# Patient Record
Sex: Female | Born: 1965 | Race: Black or African American | Hispanic: No | Marital: Married | Smoking: Former smoker
Health system: Southern US, Community
[De-identification: ages and names within clinical notes are randomized; demographics above are authoritative.]

## PROBLEM LIST (undated history)

## (undated) DIAGNOSIS — Z803 Family history of malignant neoplasm of breast: Secondary | ICD-10-CM

## (undated) DIAGNOSIS — C801 Malignant (primary) neoplasm, unspecified: Secondary | ICD-10-CM

## (undated) DIAGNOSIS — K219 Gastro-esophageal reflux disease without esophagitis: Secondary | ICD-10-CM

## (undated) DIAGNOSIS — C50919 Malignant neoplasm of unspecified site of unspecified female breast: Secondary | ICD-10-CM

## (undated) DIAGNOSIS — R519 Headache, unspecified: Secondary | ICD-10-CM

## (undated) DIAGNOSIS — R51 Headache: Secondary | ICD-10-CM

## (undated) DIAGNOSIS — D649 Anemia, unspecified: Secondary | ICD-10-CM

## (undated) DIAGNOSIS — Z1379 Encounter for other screening for genetic and chromosomal anomalies: Secondary | ICD-10-CM

## (undated) DIAGNOSIS — M1990 Unspecified osteoarthritis, unspecified site: Secondary | ICD-10-CM

## (undated) DIAGNOSIS — I10 Essential (primary) hypertension: Secondary | ICD-10-CM

## (undated) DIAGNOSIS — T8189XA Other complications of procedures, not elsewhere classified, initial encounter: Secondary | ICD-10-CM

## (undated) HISTORY — DX: Family history of malignant neoplasm of breast: Z80.3

## (undated) HISTORY — DX: Malignant neoplasm of unspecified site of unspecified female breast: C50.919

## (undated) HISTORY — PX: MULTIPLE TOOTH EXTRACTIONS: SHX2053

## (undated) HISTORY — DX: Encounter for other screening for genetic and chromosomal anomalies: Z13.79

## (undated) HISTORY — PX: BREAST SURGERY: SHX581

## (undated) HISTORY — PX: COLONOSCOPY W/ BIOPSIES AND POLYPECTOMY: SHX1376

---

## 1998-12-28 ENCOUNTER — Emergency Department (HOSPITAL_COMMUNITY): Admission: EM | Admit: 1998-12-28 | Discharge: 1998-12-28 | Payer: Self-pay | Admitting: Emergency Medicine

## 1998-12-28 ENCOUNTER — Encounter: Payer: Self-pay | Admitting: Emergency Medicine

## 2000-03-03 ENCOUNTER — Encounter: Payer: Self-pay | Admitting: *Deleted

## 2000-03-03 ENCOUNTER — Ambulatory Visit (HOSPITAL_COMMUNITY): Admission: RE | Admit: 2000-03-03 | Discharge: 2000-03-03 | Payer: Self-pay | Admitting: *Deleted

## 2000-03-06 ENCOUNTER — Inpatient Hospital Stay (HOSPITAL_COMMUNITY): Admission: AD | Admit: 2000-03-06 | Discharge: 2000-03-06 | Payer: Self-pay | Admitting: *Deleted

## 2000-07-10 ENCOUNTER — Encounter (HOSPITAL_COMMUNITY): Admission: RE | Admit: 2000-07-10 | Discharge: 2000-07-17 | Payer: Self-pay | Admitting: *Deleted

## 2000-07-14 ENCOUNTER — Encounter: Payer: Self-pay | Admitting: *Deleted

## 2000-07-14 ENCOUNTER — Ambulatory Visit (HOSPITAL_COMMUNITY): Admission: RE | Admit: 2000-07-14 | Discharge: 2000-07-14 | Payer: Self-pay | Admitting: *Deleted

## 2000-07-16 ENCOUNTER — Inpatient Hospital Stay (HOSPITAL_COMMUNITY): Admission: AD | Admit: 2000-07-16 | Discharge: 2000-07-19 | Payer: Self-pay | Admitting: *Deleted

## 2000-07-20 ENCOUNTER — Encounter: Admission: RE | Admit: 2000-07-20 | Discharge: 2000-08-05 | Payer: Self-pay | Admitting: *Deleted

## 2001-02-15 ENCOUNTER — Other Ambulatory Visit: Admission: RE | Admit: 2001-02-15 | Discharge: 2001-02-15 | Payer: Self-pay | Admitting: *Deleted

## 2001-09-08 HISTORY — PX: FOOT SURGERY: SHX648

## 2004-09-17 ENCOUNTER — Emergency Department (HOSPITAL_COMMUNITY): Admission: EM | Admit: 2004-09-17 | Discharge: 2004-09-17 | Payer: Self-pay | Admitting: Emergency Medicine

## 2008-02-11 ENCOUNTER — Encounter: Admission: RE | Admit: 2008-02-11 | Discharge: 2008-02-11 | Payer: Self-pay | Admitting: Family Medicine

## 2009-03-16 ENCOUNTER — Encounter: Admission: RE | Admit: 2009-03-16 | Discharge: 2009-03-16 | Payer: Self-pay | Admitting: Obstetrics and Gynecology

## 2010-12-06 ENCOUNTER — Other Ambulatory Visit: Payer: Self-pay | Admitting: Obstetrics and Gynecology

## 2010-12-06 DIAGNOSIS — Z1231 Encounter for screening mammogram for malignant neoplasm of breast: Secondary | ICD-10-CM

## 2010-12-20 ENCOUNTER — Ambulatory Visit
Admission: RE | Admit: 2010-12-20 | Discharge: 2010-12-20 | Disposition: A | Discharge status: Home / Self Care | Payer: Federal, State, Local not specified - PPO | Source: Ambulatory Visit | Attending: Obstetrics and Gynecology | Admitting: Obstetrics and Gynecology

## 2010-12-20 DIAGNOSIS — Z1231 Encounter for screening mammogram for malignant neoplasm of breast: Secondary | ICD-10-CM

## 2011-01-24 NOTE — Op Note (Signed)
[facility]  Patient:    Briana Price, Briana Price                    MRN: [id number] Proc. Date: 07/16/00 Adm. Date:  [id number] Attending:  Deniece Ree                           Operative Report  PREOPERATIVE DIAGNOSIS:       Intrauterine pregnancy of approximately [redacted] weeks gestation with microsomia, estimated fetal weight of approximately 10 pounds.  POSTOPERATIVE DIAGNOSIS:      Intrauterine pregnancy of approximately [redacted] weeks gestation with microsomia, estimated fetal weight of approximately 10 pounds. Viable female infant with an Apgar of 8 and 9 and weight of 10 pounds.  OPERATION:                    Primary low transverse Pfannenstiel incision cesarean section.  SURGEON:                      Deniece Ree, M.D.  ASSISTANT:                    Kathreen Cosier, M.D.  ANESTHESIA:                   Spinal.  PEDIATRIC PHYSICIAN:          Dagoberto Ligas, M.D.  ESTIMATED BLOOD LOSS:         350 cc.  COMPLICATIONS:                None. The patient tolerated the procedure well and returned to the recovery room in satisfactory condition.  DESCRIPTION OF PROCEDURE:     The patient was taken to the operating room, prepped and draped in usual fashion for a cesarean section. A low Pfannenstiel incision was made. This was carried down through the fascia. At which time, the fascia was entered and excised, extended the incision. A midline was identified. Rectus muscle separated. The abdominal peritoneum was then entered in a vertical fashion using Metzenbaum scissors. The visceral peritoneum was then excised bilaterally toward the round ligaments. Following which, the lower uterine segment was scored. Infant in the midline and bluntly dissected open. Hand was introduced and a viable infants head was delivered. There was moderate meconium present. The nasopharynx was then ______ removing a small amount of fluid. The nasal and pharynx were then  sucked out with the suction bulb. Complete delivery was then carried out without any problems. Cord was clamped and infant turned over to the pediatricians who were in attendance. Cord blood was then obtained. Following which, the placenta, as well as all products of conception, was then manually removed from the uterine cavity. IV Pitocin as well as IV antibiotics were then begun. The myometrium was then closed with #1 chromic in a running locking stitch followed by ______ stitch again using #1 chromic. Reparallelization carried out using 2-0 chromic in a running stitch. Sponge and needle count was correct x 2. Hemostasis was present. Tubes and ovaries bilaterally appear to be within normal limits. The abdominal peritoneum then closed with 2-0 chromic in a running stitch followed by closure of the fascia with #1 Dexon in running stitch. The skin was closed with skin staples. Procedure terminated. The patient tolerated the procedure well and returned to the recovery room in satisfactory condition. DD:  07/16/00 TD:  07/16/00 Job:  [id number]

## 2011-01-24 NOTE — H&P (Signed)
[facility]  Patient:    Briana Price, Briana Price                    MRN: [id number] Adm. Date:  [id number] Attending:  Deniece Ree                         History and Physical  HISTORY:                      The patient is a 45 year old gravida 1, whose estimated date of confinement is July 04, 2000.  The patient was evaluated and felt to have a very large infant with an estimated fetal weight of 10 pounds.  An ultrasound was obtained, which gave an estimated weight of 9-1/2 pounds.  It was discussed with the patient, and the decision was to proceed with a primary cesarean section, with a diagnosis of macrosomia and post-dates.  The patient has had no prenatal complications.  PAST MEDICAL HISTORY:         Significant in that she had a LEEP procedure in 1989.  No other problems.  PHYSICAL EXAMINATION:  GENERAL:                      A well-developed, well-nourished gravid female, in no acute distress.  HEENT:                        Within normal limits.  NECK:                         Supple.  BREASTS:                      Without masses, tenderness, or discharge.  LUNGS:                        Clear to percussion and auscultation.  HEART:                        A normal sinus rhythm without murmur, rub, or gallop.  ABDOMEN:                      Is term gravid.  Fetal heart beats in the left lower quadrant.  EXTREMITIES:                  Within normal limits.  NEUROLOGIC:                   Within normal limits.  PELVIC:                       The cervix is closed, posterior, without any effacement, vertex presentation.  ADMISSION DIAGNOSES:          Intrauterine pregnancy, post-dates, of                               approximately [redacted] weeks gestation, with                               macrosomia.  PLAN:  A primary cesarean section. DD:  07/16/00 TD:  07/16/00 Job: [id number]

## 2011-12-15 ENCOUNTER — Other Ambulatory Visit: Payer: Self-pay | Admitting: Obstetrics and Gynecology

## 2011-12-15 DIAGNOSIS — Z1231 Encounter for screening mammogram for malignant neoplasm of breast: Secondary | ICD-10-CM

## 2012-01-05 ENCOUNTER — Ambulatory Visit
Admission: RE | Admit: 2012-01-05 | Discharge: 2012-01-05 | Disposition: A | Discharge status: Home / Self Care | Payer: Federal, State, Local not specified - PPO | Source: Ambulatory Visit | Attending: Obstetrics and Gynecology | Admitting: Obstetrics and Gynecology

## 2012-01-05 ENCOUNTER — Inpatient Hospital Stay: Admission: RE | Admit: 2012-01-05 | Payer: Federal, State, Local not specified - PPO | Source: Ambulatory Visit

## 2012-01-05 ENCOUNTER — Ambulatory Visit: Payer: Federal, State, Local not specified - PPO

## 2012-01-05 DIAGNOSIS — Z1231 Encounter for screening mammogram for malignant neoplasm of breast: Secondary | ICD-10-CM

## 2012-10-26 ENCOUNTER — Other Ambulatory Visit: Payer: Self-pay | Admitting: Gastroenterology

## 2012-10-26 DIAGNOSIS — R110 Nausea: Secondary | ICD-10-CM

## 2012-11-05 ENCOUNTER — Encounter (HOSPITAL_COMMUNITY): Payer: Federal, State, Local not specified - PPO

## 2012-11-05 ENCOUNTER — Ambulatory Visit (HOSPITAL_COMMUNITY): Payer: Federal, State, Local not specified - PPO

## 2012-11-09 ENCOUNTER — Ambulatory Visit (HOSPITAL_COMMUNITY)
Admission: RE | Admit: 2012-11-09 | Discharge: 2012-11-09 | Disposition: A | Discharge status: Home / Self Care | Payer: Federal, State, Local not specified - PPO | Source: Ambulatory Visit | Attending: Gastroenterology | Admitting: Gastroenterology

## 2012-11-09 ENCOUNTER — Ambulatory Visit (HOSPITAL_COMMUNITY): Payer: Federal, State, Local not specified - PPO

## 2012-11-09 DIAGNOSIS — R110 Nausea: Secondary | ICD-10-CM | POA: Insufficient documentation

## 2012-12-07 ENCOUNTER — Encounter (INDEPENDENT_AMBULATORY_CARE_PROVIDER_SITE_OTHER): Payer: Self-pay

## 2012-12-14 ENCOUNTER — Encounter (INDEPENDENT_AMBULATORY_CARE_PROVIDER_SITE_OTHER): Payer: Self-pay

## 2013-01-11 ENCOUNTER — Encounter (INDEPENDENT_AMBULATORY_CARE_PROVIDER_SITE_OTHER): Payer: Self-pay | Admitting: General Surgery

## 2013-01-11 ENCOUNTER — Ambulatory Visit (INDEPENDENT_AMBULATORY_CARE_PROVIDER_SITE_OTHER): Payer: Federal, State, Local not specified - PPO | Admitting: General Surgery

## 2013-01-11 VITALS — BP 124/86 | HR 76 | Temp 97.2°F | Resp 14 | Ht 67.0 in | Wt 220.0 lb

## 2013-01-11 DIAGNOSIS — K8020 Calculus of gallbladder without cholecystitis without obstruction: Secondary | ICD-10-CM

## 2013-01-11 NOTE — H&P (Signed)
Briana Price is an 47 y.o. female.   Chief Complaint: gallstones noted on incidental ultrasound. HPI: the patient was seen by her primary care physician her obstetrician referred her to a gastroenterologist because of some lower abdominal discomfort. The pain was primarily on the left also has some discomfort on the right. The gastroenterologist and the workup did an upper GI endoscopy and also colonoscopy. With the exception of some small tubular adenomas the colonoscopy and upper GI endoscopy were normal.  The patient was sent for an abdominal ultrasound was demonstrated that showed multiple gallstones with a large gallstone near the neck of the gallbladder. She did not have cholecystitis.  The patient reports minimal symptoms. She did recall after questioning she has had 3-4 episodes of abdominal discomfort and pain rising up towards her epigastrium and then radiating towards her back. She cannot relate this to any particular foods or eating at all.   No past medical history on file.  Past Surgical History  Procedure Laterality Date  . Cesarean section    . Foot surgery      Family History  Problem Relation Age of Onset  . Breast cancer Maternal Grandmother   . Breast cancer Paternal Aunt    Social History:  reports that she has never smoked. She does not have any smokeless tobacco history on file. She reports that she does not drink alcohol or use illicit drugs.  Allergies: No Known Allergies   (Not in a hospital admission)  No results found for this or any previous visit (from the past 48 hour(s)). No results found.  Review of Systems  Constitutional: Negative for fever and chills.  HENT: Negative.   Eyes: Negative.   Respiratory: Negative.   Cardiovascular: Negative.   Gastrointestinal: Positive for abdominal pain (mild).  Genitourinary: Negative.   Musculoskeletal: Negative.   Skin: Negative.   Neurological: Negative.   Endo/Heme/Allergies: Negative.    Psychiatric/Behavioral: Negative.     Blood pressure 124/86, pulse 76, temperature 97.2 F (36.2 C), resp. rate 14, height 5' 7" (1.702 m), weight 220 lb (99.791 kg). Physical Exam  Constitutional: She appears well-developed and well-nourished.  HENT:  Head: Normocephalic and atraumatic.  Eyes: Conjunctivae and EOM are normal. Pupils are equal, round, and reactive to light.  Neck: Normal range of motion. Neck supple.  Cardiovascular: Normal rate, regular rhythm and normal heart sounds.   Respiratory: Effort normal and breath sounds normal.  GI: Soft. Bowel sounds are normal. There is no tenderness.  Musculoskeletal: Normal range of motion.  Neurological: She is alert.  Skin: Skin is warm and dry.  Psychiatric: She has a normal mood and affect. Her behavior is normal. Judgment and thought content normal.     Assessment/Plan Cholelithiasis with minimal symptoms.  Patient is undecided about whether or not and when she wold like to have surgery.  I believe that the patient has had symptoms although infrequent.  Would recommend cholecystectomy electively, but likely before her scheduled cruise in August.   Jazzlin Clements O 01/11/2013, 8:45 AM    

## 2013-01-11 NOTE — Progress Notes (Signed)
The patient's office visit was dictated as an admitting history and physical for possible surgery in the future.

## 2013-01-19 ENCOUNTER — Telehealth (INDEPENDENT_AMBULATORY_CARE_PROVIDER_SITE_OTHER): Payer: Self-pay

## 2013-01-19 NOTE — Telephone Encounter (Signed)
The patient called regarding scheduling surgery .   She is looking at June 2nd with Debbie.  She has a cruise set for 8/3.  I told her I think this is enough time for recovery.  She will have 2 flights that are 3 and 4 hours long.  I told her she should get up and walk at least once per hour if she can.  She asked if she can get in the water and I said yes as long as her incisions are healed.  Please call her if you disagree with the above advice given.

## 2013-01-20 ENCOUNTER — Encounter (INDEPENDENT_AMBULATORY_CARE_PROVIDER_SITE_OTHER): Payer: Self-pay

## 2013-01-27 ENCOUNTER — Encounter (INDEPENDENT_AMBULATORY_CARE_PROVIDER_SITE_OTHER): Payer: Self-pay

## 2013-02-01 ENCOUNTER — Encounter (HOSPITAL_BASED_OUTPATIENT_CLINIC_OR_DEPARTMENT_OTHER): Payer: Self-pay | Admitting: *Deleted

## 2013-02-01 NOTE — Progress Notes (Signed)
To come in 5/30 for bmet-and ekg if pcp one over 12 months- Saw cardiology last yr for chest pressure-negative-called for notes

## 2013-02-04 ENCOUNTER — Encounter (HOSPITAL_BASED_OUTPATIENT_CLINIC_OR_DEPARTMENT_OTHER)
Admission: RE | Admit: 2013-02-04 | Discharge: 2013-02-04 | Disposition: A | Discharge status: Home / Self Care | Payer: Federal, State, Local not specified - PPO | Source: Ambulatory Visit | Attending: General Surgery | Admitting: General Surgery

## 2013-02-04 LAB — BASIC METABOLIC PANEL
BUN: 12 mg/dL (ref 6–23)
CO2: 26 mEq/L (ref 19–32)
Chloride: 100 mEq/L (ref 96–112)
Creatinine, Ser: 0.66 mg/dL (ref 0.50–1.10)
Glucose, Bld: 92 mg/dL (ref 70–99)

## 2013-02-07 ENCOUNTER — Ambulatory Visit (HOSPITAL_BASED_OUTPATIENT_CLINIC_OR_DEPARTMENT_OTHER): Payer: Federal, State, Local not specified - PPO | Admitting: Anesthesiology

## 2013-02-07 ENCOUNTER — Encounter (HOSPITAL_BASED_OUTPATIENT_CLINIC_OR_DEPARTMENT_OTHER): Payer: Self-pay | Admitting: *Deleted

## 2013-02-07 ENCOUNTER — Encounter (HOSPITAL_BASED_OUTPATIENT_CLINIC_OR_DEPARTMENT_OTHER): Payer: Self-pay | Admitting: Anesthesiology

## 2013-02-07 ENCOUNTER — Ambulatory Visit (HOSPITAL_COMMUNITY): Payer: Federal, State, Local not specified - PPO

## 2013-02-07 ENCOUNTER — Ambulatory Visit (HOSPITAL_BASED_OUTPATIENT_CLINIC_OR_DEPARTMENT_OTHER)
Admission: RE | Admit: 2013-02-07 | Discharge: 2013-02-07 | Disposition: A | Discharge status: Home / Self Care | Payer: Federal, State, Local not specified - PPO | Source: Ambulatory Visit | Attending: General Surgery | Admitting: General Surgery

## 2013-02-07 ENCOUNTER — Encounter (HOSPITAL_BASED_OUTPATIENT_CLINIC_OR_DEPARTMENT_OTHER)
Admission: RE | Disposition: A | Discharge status: Home / Self Care | Payer: Self-pay | Source: Ambulatory Visit | Attending: General Surgery

## 2013-02-07 DIAGNOSIS — K8010 Calculus of gallbladder with chronic cholecystitis without obstruction: Secondary | ICD-10-CM

## 2013-02-07 DIAGNOSIS — Z860100 Personal history of colon polyps, unspecified: Secondary | ICD-10-CM | POA: Insufficient documentation

## 2013-02-07 DIAGNOSIS — K219 Gastro-esophageal reflux disease without esophagitis: Secondary | ICD-10-CM | POA: Insufficient documentation

## 2013-02-07 DIAGNOSIS — K8020 Calculus of gallbladder without cholecystitis without obstruction: Secondary | ICD-10-CM

## 2013-02-07 DIAGNOSIS — Z803 Family history of malignant neoplasm of breast: Secondary | ICD-10-CM | POA: Insufficient documentation

## 2013-02-07 DIAGNOSIS — I10 Essential (primary) hypertension: Secondary | ICD-10-CM | POA: Insufficient documentation

## 2013-02-07 HISTORY — PX: CHOLECYSTECTOMY: SHX55

## 2013-02-07 HISTORY — DX: Essential (primary) hypertension: I10

## 2013-02-07 HISTORY — DX: Gastro-esophageal reflux disease without esophagitis: K21.9

## 2013-02-07 SURGERY — LAPAROSCOPIC CHOLECYSTECTOMY WITH INTRAOPERATIVE CHOLANGIOGRAM
Anesthesia: General | Site: Abdomen | Wound class: Clean Contaminated

## 2013-02-07 MED ORDER — LIDOCAINE HCL (CARDIAC) 20 MG/ML IV SOLN
INTRAVENOUS | Status: DC | PRN
  Administered 2013-02-07: 75 mg via INTRAVENOUS

## 2013-02-07 MED ORDER — METOCLOPRAMIDE HCL 5 MG/ML IJ SOLN
INTRAMUSCULAR | Status: DC | PRN
  Administered 2013-02-07: 10 mg via INTRAVENOUS

## 2013-02-07 MED ORDER — MIDAZOLAM HCL 5 MG/5ML IJ SOLN
INTRAMUSCULAR | Status: DC | PRN
  Administered 2013-02-07: 2 mg via INTRAVENOUS

## 2013-02-07 MED ORDER — SODIUM CHLORIDE 0.9 % IV SOLN
INTRAVENOUS | Status: DC | PRN
  Administered 2013-02-07: 12:00:00

## 2013-02-07 MED ORDER — METOCLOPRAMIDE HCL 5 MG/ML IJ SOLN: 10.0000 mg | Freq: Once | INTRAMUSCULAR | Status: DC | PRN

## 2013-02-07 MED ORDER — DEXAMETHASONE SODIUM PHOSPHATE 4 MG/ML IJ SOLN
INTRAMUSCULAR | Status: DC | PRN
  Administered 2013-02-07: 10 mg via INTRAVENOUS

## 2013-02-07 MED ORDER — HYDROMORPHONE HCL PF 1 MG/ML IJ SOLN: 0.2500 mg | INTRAMUSCULAR | Status: DC | PRN

## 2013-02-07 MED ORDER — LACTATED RINGERS IV SOLN
INTRAVENOUS | Status: DC
  Administered 2013-02-07 (×2): via INTRAVENOUS

## 2013-02-07 MED ORDER — OXYCODONE HCL 5 MG/5ML PO SOLN: 5.0000 mg | Freq: Once | ORAL | Status: AC | PRN

## 2013-02-07 MED ORDER — ROCURONIUM BROMIDE 100 MG/10ML IV SOLN
INTRAVENOUS | Status: DC | PRN
  Administered 2013-02-07: 40 mg via INTRAVENOUS

## 2013-02-07 MED ORDER — SODIUM CHLORIDE 0.9 % IR SOLN
Status: DC | PRN
  Administered 2013-02-07: 400 mL

## 2013-02-07 MED ORDER — NEOSTIGMINE METHYLSULFATE 1 MG/ML IJ SOLN
INTRAMUSCULAR | Status: DC | PRN
  Administered 2013-02-07: 4 mg via INTRAVENOUS

## 2013-02-07 MED ORDER — SCOPOLAMINE 1 MG/3DAYS TD PT72
1.0000 | MEDICATED_PATCH | Freq: Once | TRANSDERMAL | Status: DC
  Administered 2013-02-07: 1.5 mg via TRANSDERMAL

## 2013-02-07 MED ORDER — OXYCODONE-ACETAMINOPHEN 5-325 MG PO TABS: 1.0000 | ORAL_TABLET | ORAL | Status: DC | PRN

## 2013-02-07 MED ORDER — PROPOFOL 10 MG/ML IV BOLUS
INTRAVENOUS | Status: DC | PRN
  Administered 2013-02-07: 200 mg via INTRAVENOUS

## 2013-02-07 MED ORDER — MIDAZOLAM HCL 2 MG/2ML IJ SOLN: 1.0000 mg | INTRAMUSCULAR | Status: DC | PRN

## 2013-02-07 MED ORDER — GLYCOPYRROLATE 0.2 MG/ML IJ SOLN
INTRAMUSCULAR | Status: DC | PRN
  Administered 2013-02-07: 0.6 mg via INTRAVENOUS

## 2013-02-07 MED ORDER — ONDANSETRON HCL 4 MG/2ML IJ SOLN
INTRAMUSCULAR | Status: DC | PRN
  Administered 2013-02-07: 4 mg via INTRAVENOUS

## 2013-02-07 MED ORDER — FENTANYL CITRATE 0.05 MG/ML IJ SOLN
INTRAMUSCULAR | Status: DC | PRN
  Administered 2013-02-07: 25 ug via INTRAVENOUS
  Administered 2013-02-07: 100 ug via INTRAVENOUS
  Administered 2013-02-07: 25 ug via INTRAVENOUS

## 2013-02-07 MED ORDER — BUPIVACAINE-EPINEPHRINE 0.25% -1:200000 IJ SOLN
INTRAMUSCULAR | Status: DC | PRN
  Administered 2013-02-07: 15.5 mL

## 2013-02-07 MED ORDER — OXYCODONE HCL 5 MG PO TABS
5.0000 mg | ORAL_TABLET | Freq: Once | ORAL | Status: AC | PRN
  Administered 2013-02-07: 5 mg via ORAL

## 2013-02-07 MED ORDER — FENTANYL CITRATE 0.05 MG/ML IJ SOLN: 50.0000 ug | INTRAMUSCULAR | Status: DC | PRN

## 2013-02-07 MED ORDER — DEXTROSE 5 % IV SOLN
2.0000 g | INTRAVENOUS | Status: DC | PRN
  Administered 2013-02-07: 2 g via INTRAVENOUS

## 2013-02-07 SURGICAL SUPPLY — 51 items
ADH SKN CLS APL DERMABOND .7 (GAUZE/BANDAGES/DRESSINGS) ×1
APPLIER CLIP 5 13 M/L LIGAMAX5 (MISCELLANEOUS) ×2
APPLIER CLIP ROT 10 11.4 M/L (STAPLE)
APR CLP MED LRG 11.4X10 (STAPLE)
APR CLP MED LRG 5 ANG JAW (MISCELLANEOUS) ×1
BAG SPEC RTRVL LRG 6X4 10 (ENDOMECHANICALS) ×1
BLADE SURG ROTATE 9660 (MISCELLANEOUS) IMPLANT
CANISTER SUCTION 2500CC (MISCELLANEOUS) ×2 IMPLANT
CHLORAPREP W/TINT 26ML (MISCELLANEOUS) ×2 IMPLANT
CLEANER CAUTERY TIP 5X5 PAD (MISCELLANEOUS) ×1 IMPLANT
CLIP APPLIE 5 13 M/L LIGAMAX5 (MISCELLANEOUS) ×1 IMPLANT
CLIP APPLIE ROT 10 11.4 M/L (STAPLE) IMPLANT
CLOTH BEACON ORANGE TIMEOUT ST (SAFETY) ×2 IMPLANT
COVER MAYO STAND STRL (DRAPES) ×2 IMPLANT
DECANTER SPIKE VIAL GLASS SM (MISCELLANEOUS) IMPLANT
DERMABOND ADVANCED (GAUZE/BANDAGES/DRESSINGS) ×1
DERMABOND ADVANCED .7 DNX12 (GAUZE/BANDAGES/DRESSINGS) ×1 IMPLANT
DRAPE C-ARM 42X72 X-RAY (DRAPES) ×2 IMPLANT
DRAPE UTILITY XL STRL (DRAPES) ×2 IMPLANT
DRSG TEGADERM 2-3/8X2-3/4 SM (GAUZE/BANDAGES/DRESSINGS) ×8 IMPLANT
ELECT REM PT RETURN 9FT ADLT (ELECTROSURGICAL) ×2
ELECTRODE REM PT RTRN 9FT ADLT (ELECTROSURGICAL) ×1 IMPLANT
FILTER SMOKE EVAC LAPAROSHD (FILTER) ×1 IMPLANT
GLOVE BIO SURGEON STRL SZ 6.5 (GLOVE) ×1 IMPLANT
GLOVE BIO SURGEON STRL SZ7 (GLOVE) ×1 IMPLANT
GLOVE BIO SURGEON STRL SZ7.5 (GLOVE) ×1 IMPLANT
GLOVE BIOGEL PI IND STRL 8 (GLOVE) ×1 IMPLANT
GLOVE BIOGEL PI INDICATOR 8 (GLOVE) ×1
GLOVE ECLIPSE 7.5 STRL STRAW (GLOVE) ×2 IMPLANT
GLOVE INDICATOR 6.5 STRL GRN (GLOVE) ×1 IMPLANT
GLOVE INDICATOR 7.0 STRL GRN (GLOVE) ×1 IMPLANT
GOWN PREVENTION PLUS XLARGE (GOWN DISPOSABLE) ×7 IMPLANT
HEMOSTAT SNOW SURGICEL 2X4 (HEMOSTASIS) ×1 IMPLANT
NS IRRIG 1000ML POUR BTL (IV SOLUTION) IMPLANT
PACK BASIN DAY SURGERY FS (CUSTOM PROCEDURE TRAY) ×2 IMPLANT
PAD CLEANER CAUTERY TIP 5X5 (MISCELLANEOUS) ×1
POUCH SPECIMEN RETRIEVAL 10MM (ENDOMECHANICALS) ×2 IMPLANT
SCISSORS LAP 5X35 DISP (ENDOMECHANICALS) IMPLANT
SET CHOLANGIOGRAPH 5 50 .035 (SET/KITS/TRAYS/PACK) ×1 IMPLANT
SET IRRIG TUBING LAPAROSCOPIC (IRRIGATION / IRRIGATOR) ×2 IMPLANT
SLEEVE ENDOPATH XCEL 5M (ENDOMECHANICALS) ×4 IMPLANT
SLEEVE SCD COMPRESS KNEE MED (MISCELLANEOUS) ×2 IMPLANT
SPECIMEN JAR SMALL (MISCELLANEOUS) ×2 IMPLANT
SUT MNCRL AB 4-0 PS2 18 (SUTURE) ×2 IMPLANT
TOWEL OR 17X24 6PK STRL BLUE (TOWEL DISPOSABLE) ×2 IMPLANT
TOWEL OR NON WOVEN STRL DISP B (DISPOSABLE) ×2 IMPLANT
TRAY LAPAROSCOPIC (CUSTOM PROCEDURE TRAY) ×2 IMPLANT
TROCAR XCEL BLUNT TIP 100MML (ENDOMECHANICALS) ×2 IMPLANT
TROCAR XCEL NON-BLD 11X100MML (ENDOMECHANICALS) IMPLANT
TROCAR XCEL NON-BLD 5MMX100MML (ENDOMECHANICALS) ×2 IMPLANT
TUBE CONNECTING 20X1/4 (TUBING) ×2 IMPLANT

## 2013-02-07 NOTE — Interval H&P Note (Signed)
History and Physical Interval Note:  02/07/2013 10:32 AM  Briana Price  has presented today for surgery, with the diagnosis of cholelithisis  The various methods of treatment have been discussed with the patient and family. After consideration of risks, benefits and other options for treatment, the patient has consented to  Procedure(s): LAPAROSCOPIC CHOLECYSTECTOMY WITH INTRAOPERATIVE CHOLANGIOGRAM (N/A) as a surgical intervention .  The patient's history has been reviewed, patient examined, no change in status, stable for surgery.  I have reviewed the patient's chart and labs.  Questions were answered to the patient's satisfaction.    No symptoms since initial evaluation.   Ernestine Langworthy, Marta Lamas

## 2013-02-07 NOTE — Op Note (Signed)
OPERATIVE REPORT  DATE OF OPERATION: 02/07/2013  PATIENT:  Briana Price  47 y.o. female  PRE-OPERATIVE DIAGNOSIS:  cholelithiasis  POST-OPERATIVE DIAGNOSIS:  cholelithiasis  PROCEDURE:  Procedure(s): LAPAROSCOPIC CHOLECYSTECTOMY WITH INTRAOPERATIVE CHOLANGIOGRAM  SURGEON:  Surgeon(s): Cherylynn Ridges, MD Robyne Askew, MD  ASSISTANT: Carolynne Edouard  ANESTHESIA:   general  EBL: <20 ml  BLOOD ADMINISTERED: none  DRAINS: none   SPECIMEN:  Source of Specimen:  Gallbladder with stones  COUNTS CORRECT:  YES  PROCEDURE DETAILS: The patient was taken to the operating room and placed on the table in the supine position.  After an adequate endotracheal anesthetic was administered, (she/he) was prepped with (ChloroPrep/Betadine), and then draped in the usual manner exposing the entire abdomen laterally, inferiorly and up  to the costal margins.  After a proper timeout was performed including identifying the patient and the procedure to be performed, a infra-umbilical 1.5cm midline incision was made using a #15 blade.  This was taken down to the fascia which was then incised with a #15 blade.  The edges of the fascia were tented up with Kocher clamps as the preperitoneal space was penetrated with a Kelly clamp into the peritoneum.  Once this was done, a pursestring suture of 0 Vicryl was passed around the fascial opening.  This was subsequently used to secure the [facility] cannula which was passed into the peritoneal cavity.  Once the [facility] cannula was in place, carbon dioxide gas was insufflated into the peritoneal cavity up to a maximal intra-abdominal pressure of 15mm Hg.The laparoscope, with attached camera and light source, was passed into the peritoneal cavity to visualize the direct insertion of two right upper quadrant 5mm cannulas, and a sup-xiphoid 5mm cannula.  Once all cannulas were in place, the dissection was begun.  Two ratcheted graspers were attached to the dome and infundibulum of  the gallbladder and retracted towards the anterior abdominal wall and the right upper quadrant.  Using cautery attached to a dissecting forceps, the peritoneum overlaying the triangle of Chalot and the hepatoduodenal triangle was dissected away exposing the cystic duct and the cystic artery.  A clip was placed on the gallbladder side of the cystic duct, then a cholecytodochotomy made using the laparoscopic scissors.  Through the cholecystodochotomy a Cook catheter was passed to performed a cholangiogram.  The cholangiogram showed good flow into the duodenum, good proximal filling, no intraductal filling defects, and no dilatation..  Once the cholangiogram was completed, the [facility] catheter was removed, and the distal cystic duct was clipped multiple times then transected.  The gallbladder was then dissected out of the hepatic bed without event.  It was retrieved from the abdomen (using an EndoCatch bag) without event.  Once the gallbladder was removed, the bed was inspected for hemostasis.  Once excellent hemostasis was obtained all gas and fluids were aspirated from above the liver, then the cannulas were removed.  The infra-umbilical incision was closed using the pursestring suture which was in place.  0.25% bupivicaine with epinephrine was injected at all sites.  All 10mm or greater cannula sites were close using a running subcuticular stitch of 4-0 Monocryl.  5.45mm cannula sites were closed with Dermabond only.Steri-Strips and Tagaderm were used to complete the dressings at all sites.  At this point all needle, sponge, and instrument counts were correct.The patient was awakened from anesthesia and taken to the PACU in stable condition.  PATIENT DISPOSITION:  PACU - hemodynamically stable.   Cherylynn Ridges 6/2/201412:06 PM

## 2013-02-07 NOTE — Anesthesia Postprocedure Evaluation (Signed)
Anesthesia Post Note  Patient: Briana Price  Procedure(s) Performed: Procedure(s) (LRB): LAPAROSCOPIC CHOLECYSTECTOMY WITH INTRAOPERATIVE CHOLANGIOGRAM (N/A)  Anesthesia type: General  Patient location: PACU  Post pain: Pain level controlled  Post assessment: Patient's Cardiovascular Status Stable  Last Vitals:  Filed Vitals:   02/07/13 1315  BP: 140/92  Pulse: 74  Temp:   Resp: 15    Post vital signs: Reviewed and stable  Level of consciousness: alert  Complications: No apparent anesthesia complications

## 2013-02-07 NOTE — Anesthesia Preprocedure Evaluation (Signed)
Anesthesia Evaluation  Patient identified by MRN, date of birth, ID band Patient awake    Reviewed: Allergy & Precautions, H&P , NPO status , Patient's Chart, lab work & pertinent test results, reviewed documented beta blocker date and time   Airway Mallampati: II TM Distance: >3 FB Neck ROM: full    Dental   Pulmonary neg pulmonary ROS,  breath sounds clear to auscultation        Cardiovascular hypertension, Rhythm:regular     Neuro/Psych negative neurological ROS  negative psych ROS   GI/Hepatic negative GI ROS, Neg liver ROS, GERD-  Medicated and Controlled,  Endo/Other  negative endocrine ROS  Renal/GU negative Renal ROS  negative genitourinary   Musculoskeletal   Abdominal   Peds  Hematology negative hematology ROS (+)   Anesthesia Other Findings See surgeon's H&P   Reproductive/Obstetrics negative OB ROS                           Anesthesia Physical Anesthesia Plan  ASA: II  Anesthesia Plan: General   Post-op Pain Management:    Induction: Intravenous  Airway Management Planned: Oral ETT  Additional Equipment:   Intra-op Plan:   Post-operative Plan: Extubation in OR  Informed Consent: I have reviewed the patients History and Physical, chart, labs and discussed the procedure including the risks, benefits and alternatives for the proposed anesthesia with the patient or authorized representative who has indicated his/her understanding and acceptance.   Dental Advisory Given  Plan Discussed with: CRNA and Surgeon  Anesthesia Plan Comments:         Anesthesia Quick Evaluation

## 2013-02-07 NOTE — Transfer of Care (Signed)
Immediate Anesthesia Transfer of Care Note  Patient: Briana Price  Procedure(s) Performed: Procedure(s): LAPAROSCOPIC CHOLECYSTECTOMY WITH INTRAOPERATIVE CHOLANGIOGRAM (N/A)  Patient Location: PACU  Anesthesia Type:General  Level of Consciousness: awake, alert  and oriented  Airway & Oxygen Therapy: Patient Spontanous Breathing and Patient connected to face mask oxygen  Post-op Assessment: Report given to PACU RN and Post -op Vital signs reviewed and stable  Post vital signs: Reviewed and stable  Complications: No apparent anesthesia complications

## 2013-02-07 NOTE — H&P (View-Only) (Signed)
Briana Price is an 46 y.o. female.   Chief Complaint: gallstones noted on incidental ultrasound. HPI: the patient was seen by her primary care physician her obstetrician referred her to a gastroenterologist because of some lower abdominal discomfort. The pain was primarily on the left also has some discomfort on the right. The gastroenterologist and the workup did an upper GI endoscopy and also colonoscopy. With the exception of some small tubular adenomas the colonoscopy and upper GI endoscopy were normal.  The patient was sent for an abdominal ultrasound was demonstrated that showed multiple gallstones with a large gallstone near the neck of the gallbladder. She did not have cholecystitis.  The patient reports minimal symptoms. She did recall after questioning she has had 3-4 episodes of abdominal discomfort and pain rising up towards her epigastrium and then radiating towards her back. She cannot relate this to any particular foods or eating at all.   No past medical history on file.  Past Surgical History  Procedure Laterality Date  . Cesarean section    . Foot surgery      Family History  Problem Relation Age of Onset  . Breast cancer Maternal Grandmother   . Breast cancer Paternal Aunt    Social History:  reports that she has never smoked. She does not have any smokeless tobacco history on file. She reports that she does not drink alcohol or use illicit drugs.  Allergies: No Known Allergies   (Not in a hospital admission)  No results found for this or any previous visit (from the past 48 hour(s)). No results found.  Review of Systems  Constitutional: Negative for fever and chills.  HENT: Negative.   Eyes: Negative.   Respiratory: Negative.   Cardiovascular: Negative.   Gastrointestinal: Positive for abdominal pain (mild).  Genitourinary: Negative.   Musculoskeletal: Negative.   Skin: Negative.   Neurological: Negative.   Endo/Heme/Allergies: Negative.    Psychiatric/Behavioral: Negative.     Blood pressure 124/86, pulse 76, temperature 97.2 F (36.2 C), resp. rate 14, height 5\' 7"  (1.702 m), weight 220 lb (99.791 kg). Physical Exam  Constitutional: She appears well-developed and well-nourished.  HENT:  Head: Normocephalic and atraumatic.  Eyes: Conjunctivae and EOM are normal. Pupils are equal, round, and reactive to light.  Neck: Normal range of motion. Neck supple.  Cardiovascular: Normal rate, regular rhythm and normal heart sounds.   Respiratory: Effort normal and breath sounds normal.  GI: Soft. Bowel sounds are normal. There is no tenderness.  Musculoskeletal: Normal range of motion.  Neurological: She is alert.  Skin: Skin is warm and dry.  Psychiatric: She has a normal mood and affect. Her behavior is normal. Judgment and thought content normal.     Assessment/Plan Cholelithiasis with minimal symptoms.  Patient is undecided about whether or not and when she wold like to have surgery.  I believe that the patient has had symptoms although infrequent.  Would recommend cholecystectomy electively, but likely before her scheduled cruise in August.   Deshannon Hinchliffe O 01/11/2013, 8:45 AM

## 2013-02-08 ENCOUNTER — Encounter (INDEPENDENT_AMBULATORY_CARE_PROVIDER_SITE_OTHER): Payer: Self-pay | Admitting: General Surgery

## 2013-02-08 ENCOUNTER — Encounter (HOSPITAL_BASED_OUTPATIENT_CLINIC_OR_DEPARTMENT_OTHER): Payer: Self-pay | Admitting: General Surgery

## 2013-02-09 ENCOUNTER — Telehealth (INDEPENDENT_AMBULATORY_CARE_PROVIDER_SITE_OTHER): Payer: Self-pay

## 2013-02-09 NOTE — Telephone Encounter (Signed)
The pt called concerned about her incision.  He has bled through the gauze.  It started this when she was at the hospital.  It is not red around it.  She has no fever.  I asked her to remove the bandage, take a shower and gently wash the area.  I told her to apply a thick piece of gauze and tape it down.  Apply pressure for 5 minutes.  I told her to watch it until lunch today and call us back if it soaks through the new gauze.  She agrees.  She also asked about returning to work.  She is scheduled to f/u on 6/19.  She was written out until 6/16.  She asked if she can go to work then or does she have to see him 1st.  I told her she can go to work if she is feeling well.

## 2013-02-17 ENCOUNTER — Telehealth (INDEPENDENT_AMBULATORY_CARE_PROVIDER_SITE_OTHER): Payer: Self-pay

## 2013-02-17 NOTE — Telephone Encounter (Signed)
Patient called stating she has dry greenish color at incision site Denies fever ,odor,pus, Advised patient dry drainage at incision is ok but if she has any pus ,odor,fever to call office. She is aware of appointment  with Dr. Lindie Spruce 02/24/13

## 2013-02-24 ENCOUNTER — Encounter (INDEPENDENT_AMBULATORY_CARE_PROVIDER_SITE_OTHER): Payer: Self-pay | Admitting: General Surgery

## 2013-02-24 ENCOUNTER — Telehealth (INDEPENDENT_AMBULATORY_CARE_PROVIDER_SITE_OTHER): Payer: Self-pay | Admitting: General Surgery

## 2013-02-24 ENCOUNTER — Encounter (INDEPENDENT_AMBULATORY_CARE_PROVIDER_SITE_OTHER): Payer: Federal, State, Local not specified - PPO | Admitting: General Surgery

## 2013-02-24 ENCOUNTER — Other Ambulatory Visit: Payer: Self-pay

## 2013-02-24 DIAGNOSIS — Z1231 Encounter for screening mammogram for malignant neoplasm of breast: Secondary | ICD-10-CM

## 2013-02-24 NOTE — Telephone Encounter (Signed)
Patient called in stating that she was having car trouble and would not be able to make to make it for today's appt...per verbal orders from JW patient needs to remove all bandages and take shower..If wounds appear well without any infection or drainage then we can see her on a PRN basis, but if she has any concerns or problems to page him he may either have her come in to see urge or come over himself to see her..pt is aware and will pick up letter tomorrow at front desk 02/25/13.

## 2013-02-25 ENCOUNTER — Ambulatory Visit
Admission: RE | Admit: 2013-02-25 | Discharge: 2013-02-25 | Disposition: A | Discharge status: Home / Self Care | Payer: Federal, State, Local not specified - PPO | Source: Ambulatory Visit

## 2013-02-25 ENCOUNTER — Ambulatory Visit: Payer: Federal, State, Local not specified - PPO

## 2013-02-25 DIAGNOSIS — Z1231 Encounter for screening mammogram for malignant neoplasm of breast: Secondary | ICD-10-CM

## 2013-03-09 ENCOUNTER — Encounter (INDEPENDENT_AMBULATORY_CARE_PROVIDER_SITE_OTHER): Payer: Self-pay | Admitting: General Surgery

## 2013-07-06 ENCOUNTER — Ambulatory Visit: Payer: Federal, State, Local not specified - PPO | Admitting: Physical Therapy

## 2013-07-14 ENCOUNTER — Ambulatory Visit: Payer: Federal, State, Local not specified - PPO | Attending: Family Medicine | Admitting: Physical Therapy

## 2013-07-14 DIAGNOSIS — M25519 Pain in unspecified shoulder: Secondary | ICD-10-CM | POA: Insufficient documentation

## 2013-07-14 DIAGNOSIS — M25619 Stiffness of unspecified shoulder, not elsewhere classified: Secondary | ICD-10-CM | POA: Insufficient documentation

## 2013-07-14 DIAGNOSIS — IMO0001 Reserved for inherently not codable concepts without codable children: Secondary | ICD-10-CM | POA: Insufficient documentation

## 2013-07-20 ENCOUNTER — Ambulatory Visit: Payer: Federal, State, Local not specified - PPO | Admitting: Physical Therapy

## 2013-07-25 ENCOUNTER — Ambulatory Visit: Payer: Federal, State, Local not specified - PPO | Admitting: Physical Therapy

## 2013-07-28 ENCOUNTER — Ambulatory Visit: Payer: Federal, State, Local not specified - PPO | Admitting: Physical Therapy

## 2013-08-01 ENCOUNTER — Ambulatory Visit: Payer: Federal, State, Local not specified - PPO | Admitting: Rehabilitation

## 2013-08-03 ENCOUNTER — Ambulatory Visit: Payer: Federal, State, Local not specified - PPO | Admitting: Physical Therapy

## 2013-08-08 ENCOUNTER — Ambulatory Visit: Payer: Federal, State, Local not specified - PPO | Attending: Family Medicine | Admitting: Rehabilitation

## 2013-08-08 DIAGNOSIS — M25519 Pain in unspecified shoulder: Secondary | ICD-10-CM | POA: Insufficient documentation

## 2013-08-08 DIAGNOSIS — IMO0001 Reserved for inherently not codable concepts without codable children: Secondary | ICD-10-CM | POA: Insufficient documentation

## 2013-08-08 DIAGNOSIS — M25619 Stiffness of unspecified shoulder, not elsewhere classified: Secondary | ICD-10-CM | POA: Insufficient documentation

## 2013-08-10 ENCOUNTER — Encounter: Payer: Federal, State, Local not specified - PPO | Admitting: Rehabilitation

## 2013-08-15 ENCOUNTER — Ambulatory Visit: Payer: Federal, State, Local not specified - PPO | Admitting: Physical Therapy

## 2013-10-12 ENCOUNTER — Other Ambulatory Visit: Payer: Self-pay

## 2013-10-12 DIAGNOSIS — Z1231 Encounter for screening mammogram for malignant neoplasm of breast: Secondary | ICD-10-CM

## 2014-02-27 ENCOUNTER — Ambulatory Visit
Admission: RE | Admit: 2014-02-27 | Discharge: 2014-02-27 | Disposition: A | Discharge status: Home / Self Care | Payer: Federal, State, Local not specified - PPO | Source: Ambulatory Visit

## 2014-02-27 ENCOUNTER — Encounter (INDEPENDENT_AMBULATORY_CARE_PROVIDER_SITE_OTHER): Payer: Self-pay

## 2014-02-27 DIAGNOSIS — Z1231 Encounter for screening mammogram for malignant neoplasm of breast: Secondary | ICD-10-CM

## 2015-01-08 ENCOUNTER — Other Ambulatory Visit: Payer: Self-pay

## 2015-01-08 DIAGNOSIS — Z1231 Encounter for screening mammogram for malignant neoplasm of breast: Secondary | ICD-10-CM

## 2015-01-16 ENCOUNTER — Ambulatory Visit: Payer: Federal, State, Local not specified - PPO | Attending: Family Medicine

## 2015-01-16 DIAGNOSIS — M545 Low back pain, unspecified: Secondary | ICD-10-CM

## 2015-01-16 DIAGNOSIS — R293 Abnormal posture: Secondary | ICD-10-CM

## 2015-01-16 DIAGNOSIS — M62830 Muscle spasm of back: Secondary | ICD-10-CM | POA: Insufficient documentation

## 2015-01-16 DIAGNOSIS — M2560 Stiffness of unspecified joint, not elsewhere classified: Secondary | ICD-10-CM | POA: Diagnosis not present

## 2015-01-16 NOTE — Therapy (Signed)
[facility] [address] [place], [place], [address] Phone: [phone]   Fax:  [phone]  Physical Therapy Evaluation  Patient Details  Name: Briana Price MRN: [id number] Date of Birth: 12/10/1965 Referring Provider:  McNeill, Wendy, MD  Encounter Date: 01/16/2015      PT End of Session - 01/16/15 1403    Visit Number 1   Number of Visits 12   Date for PT Re-Evaluation 02/27/15   PT Start Time 0120   PT Stop Time 0202   PT Time Calculation (min) 42 min   Activity Tolerance Patient tolerated treatment well   Behavior During Therapy WFL for tasks assessed/performed      Past Medical History  Diagnosis Date  . Hypertension   . GERD (gastroesophageal reflux disease)     occ-no meds    Past Surgical History  Procedure Laterality Date  . Cesarean section    . Foot surgery  2003    lt foot bunionectomy  . Cholecystectomy N/A 02/07/2013    Procedure: LAPAROSCOPIC CHOLECYSTECTOMY WITH INTRAOPERATIVE CHOLANGIOGRAM;  Surgeon: James O Wyatt, MD;  Location: [facility];  Service: General;  Laterality: N/A;    There were no vitals filed for this visit.  Visit Diagnosis:  Abnormal posture - Plan: PT plan of care cert/re-cert  Joint stiffness of spine - Plan: PT plan of care cert/re-cert  Muscle spasm of back - Plan: PT plan of care cert/re-cert  Right-sided low back pain without sciatica - Plan: PT plan of care cert/re-cert      Subjective Assessment - 01/16/15 1328    Subjective She reports LBP and RT leg pain. This was initially with standing doing chores. This has been 4-5 year duration. She fell on buttocks 5-6 years ago. 6 months ago began to have posterior and lateral thigh pain.  The leg pain began with walking on track.    Limitations Standing;Walking  transfer from sitting. Bending incr pain.    How long can you sit comfortably? As needed   How long can you stand comfortably? 10 min   How  long can you walk comfortably? 15 min feels in leg bu cna keep walking   Diagnostic tests No testing   Patient Stated Goals Decreased pain    Currently in Pain? No/denies  when sitting.   She feels pain can get up to 7/10   Multiple Pain Sites No            [facility] PT Assessment - 01/16/15 1320    Assessment   Medical Diagnosis chronic LBP and RT leg pain   Precautions   Precautions None   Restrictions   Weight Bearing Restrictions No   Balance Screen   Has the patient fallen in the past 6 months No   Prior Function   Level of Independence Independent with basic ADLs   Cognition   Overall Cognitive Status Within Functional Limits for tasks assessed   Observation/Other Assessments   Focus on Therapeutic Outcomes (FOTO)  57%   ROM / Strength   AROM / PROM / Strength AROM;Strength   AROM   Overall AROM Comments Hip range normal except hip extension limited withTHOMAS test bilateral   AROM Assessment Site --   Lumbar Flexion Normal   Lumbar Extension 10   Lumbar - Right Side Bend 15   Lumbar - Left Side Bend 15   Ambulation/Gait   Gait Comments Normal                             PT Education - 01/16/15 1403    Education provided Yes   Education Details POC and stretching   Person(s) Educated Patient   Methods Explanation;Demonstration;Tactile cues;Verbal cues;Handout   Comprehension Returned demonstration;Verbalized understanding          PT Short Term Goals - 01/16/15 1407    PT SHORT TERM GOAL #1   Title She will be independent with intal HEP    Time 3   Period Weeks   Status New   PT SHORT TERM GOAL #2   Title she will report able to do a full sink of dishes with min pain and no need to stop   Time 3   Period Weeks   Status New   PT SHORT TERM GOAL #3   Title She will rpeort able to rise fro chair with min pain   Time 3   Period Weeks           PT Long Term Goals - 01/16/15 1408    PT LONG TERM GOAL #1   Title She will be  independent with al HEP issued as of last visit   Time 6   Period Weeks   Status New   PT LONG TERM GOAL #2   Title she will be able to walk full workout on track with no leg pain and min back pain   Time 6   Period Weeks   Status New   PT LONG TERM GOAL #3   Title She will be able to rise from chair without pain.    Time 6   Period Weeks   Status New   PT LONG TERM GOAL #4   Title she will be able to verbalize or demo understanding proper posure and mechanics to minimize pain   Time 6   Period Weeks   Status New   PT LONG TERM GOAL #5   Title She will be able to bend over for basic home tasks with min to no pain   Time 6   Period Weeks   Status New               Plan - 01/16/15 1404    Clinical Impression Statement She appears to tightness in spine and anterior hip creating extension postureing causing pain. She should improve with PT   Pt will benefit from skilled therapeutic intervention in order to improve on the following deficits Difficulty walking;Decreased range of motion;Postural dysfunction;Pain;Increased muscle spasms   Rehab Potential Good   PT Frequency 2x / week   PT Duration 6 weeks   PT Treatment/Interventions Moist Heat;Cryotherapy;Electrical Stimulation;Ultrasound;Traction;Patient/family education;Therapeutic exercise;Passive range of motion;Manual techniques;Therapeutic activities   PT Next Visit Plan posture and core strength exercises. ? MET for LT rotatated L5 or sacrum   PT Home Exercise Plan Stretch with flexion emphasis   Consulted and Agree with Plan of Care Patient         Problem List Patient Active Problem List   Diagnosis Date Noted  . Cholelithiasis 01/11/2013    Chasse, Stephen M PT 01/16/2015, 2:13 PM  [facility] [address] [place], [place], [address] Phone: [phone]   Fax:  [phone]     

## 2015-01-16 NOTE — Patient Instructions (Signed)
Issued exercise /stretch from cabinet for tight hip flexors and general flexion stretching knee to chest and hamstring stretch 30 sec x 2-3 reps 1-2x/day.

## 2015-01-23 ENCOUNTER — Ambulatory Visit: Payer: Federal, State, Local not specified - PPO | Admitting: Physical Therapy

## 2015-01-23 DIAGNOSIS — M545 Low back pain, unspecified: Secondary | ICD-10-CM

## 2015-01-23 DIAGNOSIS — M62830 Muscle spasm of back: Secondary | ICD-10-CM

## 2015-01-23 DIAGNOSIS — M2560 Stiffness of unspecified joint, not elsewhere classified: Secondary | ICD-10-CM

## 2015-01-23 DIAGNOSIS — R293 Abnormal posture: Secondary | ICD-10-CM | POA: Diagnosis not present

## 2015-01-23 NOTE — Patient Instructions (Signed)

## 2015-01-23 NOTE — Therapy (Addendum)
[place], [place], [address] Phone: [phone]   Fax:  [phone]  Physical Therapy Treatment  Patient Details  Name: Briana Price MRN: [id number] Date of Birth: July 15, 1966 Referring Provider:  [name], MD  Encounter Date: 01/23/2015      PT End of Session - 01/23/15 0742    Visit Number 2   Number of Visits 12   Date for PT Re-Evaluation 02/27/15   PT Start Time 0735   PT Stop Time 0802   PT Time Calculation (min) 27 min      Past Medical History  Diagnosis Date  . Hypertension   . GERD (gastroesophageal reflux disease)     occ-no meds    Past Surgical History  Procedure Laterality Date  . Cesarean section    . Foot surgery  2003    lt foot bunionectomy  . Cholecystectomy N/A 02/07/2013    Procedure: LAPAROSCOPIC CHOLECYSTECTOMY WITH INTRAOPERATIVE CHOLANGIOGRAM;  Surgeon: Gwenyth Ober, MD;  Location: [facility];  Service: General;  Laterality: N/A;    There were no vitals filed for this visit.  Visit Diagnosis:  Abnormal posture  Joint stiffness of spine  Muscle spasm of back  Right-sided low back pain without sciatica      Subjective Assessment - 01/23/15 0741    Subjective No pain now. Increases with standing.    Currently in Pain? No/denies                         [facility] Adult PT Treatment/Exercise - 01/23/15 0741    Lumbar Exercises: Stretches   Active Hamstring Stretch 3 reps;30 seconds   Single Knee to Chest Stretch 3 reps;30 seconds   Hip Flexor Stretch 3 reps;60 seconds   Hip Flexor Stretch Limitations bilateral   Lumbar Exercises: Supine   Ab Set 10 reps   Clam 10 reps   Heel Slides 10 reps   Bent Knee Raise 10 reps                PT Education - 01/23/15 0801    Education provided Yes   Education Details Prepilates HEP, foot in cabinet while doing dishes.    Person(s) Educated Patient   Methods  Explanation;Handout   Comprehension Verbalized understanding          PT Short Term Goals - 01/23/15 0756    PT SHORT TERM GOAL #1   Title She will be independent with intal HEP    Time 3   Period Weeks   Status Achieved   PT SHORT TERM GOAL #2   Title she will report able to do a full sink of dishes with min pain and no need to stop   Time 3   Period Weeks   Status On-going   PT SHORT TERM GOAL #3   Title She will rpeort able to rise fro chair with min pain   Time 3   Period Weeks   Status On-going           PT Long Term Goals - 01/23/15 0757    PT LONG TERM GOAL #1   Title She will be independent with al HEP issued as of last visit   Time 6   Period Weeks   Status On-going   PT LONG TERM GOAL #2   Title she will be able to walk full workout on track with no leg pain and min back  pain   Time 6   Period Weeks   Status On-going   PT LONG TERM GOAL #3   Title She will be able to rise from chair without pain.    Time 6   Period Weeks   Status On-going   PT LONG TERM GOAL #4   Title she will be able to verbalize or demo understanding proper posure and mechanics to minimize pain   Time 6   Period Weeks   Status On-going   PT LONG TERM GOAL #5   Title She will be able to bend over for basic home tasks with min to no pain   Time 6   Period Weeks   Status On-going               Plan - 01/23/15 0825    Clinical Impression Statement Pt is independent with her initial HEP. She still has pain with standing. Instructed pt in supine core strengthening without increased pain and added to HEP.    PT Next Visit Plan posture and core strength exercises. ? MET for LT rotatated L5 or sacrum, review prepilates hep, more body mechanics        Problem List Patient Active Problem List   Diagnosis Date Noted  . Cholelithiasis 01/11/2013    Dorene Ar, PTA 01/23/2015, 8:26 AM  [facility] [address] [place], [place], [address] Phone: [phone]   Fax:  [phone]   PHYSICAL THERAPY DISCHARGE SUMMARY  Visits from Start of Care: 2  Current functional level related to goals / functional outcomes: Unknown   Remaining deficits: Unknown   Education / Equipment: NA Plan:                                                    Patient goals were not met. Patient is being discharged due to not returning since the last visit.  ?????    Lillette Boxer Chasse  PT  07/03/16  3:23 PM

## 2015-03-01 ENCOUNTER — Ambulatory Visit
Admission: RE | Admit: 2015-03-01 | Discharge: 2015-03-01 | Disposition: A | Discharge status: Home / Self Care | Payer: Federal, State, Local not specified - PPO | Source: Ambulatory Visit

## 2015-03-01 DIAGNOSIS — Z1231 Encounter for screening mammogram for malignant neoplasm of breast: Secondary | ICD-10-CM

## 2015-11-30 ENCOUNTER — Other Ambulatory Visit: Payer: Self-pay | Admitting: Orthopedic Surgery

## 2015-11-30 DIAGNOSIS — M5416 Radiculopathy, lumbar region: Secondary | ICD-10-CM

## 2015-12-08 ENCOUNTER — Ambulatory Visit
Admission: RE | Admit: 2015-12-08 | Discharge: 2015-12-08 | Disposition: A | Discharge status: Home / Self Care | Payer: Federal, State, Local not specified - PPO | Source: Ambulatory Visit | Attending: Orthopedic Surgery | Admitting: Orthopedic Surgery

## 2015-12-08 DIAGNOSIS — M545 Low back pain: Secondary | ICD-10-CM | POA: Diagnosis not present

## 2015-12-08 DIAGNOSIS — M5416 Radiculopathy, lumbar region: Secondary | ICD-10-CM

## 2015-12-10 DIAGNOSIS — M4696 Unspecified inflammatory spondylopathy, lumbar region: Secondary | ICD-10-CM | POA: Diagnosis not present

## 2015-12-25 DIAGNOSIS — L720 Epidermal cyst: Secondary | ICD-10-CM | POA: Diagnosis not present

## 2016-02-25 DIAGNOSIS — L720 Epidermal cyst: Secondary | ICD-10-CM | POA: Diagnosis not present

## 2016-02-26 DIAGNOSIS — L720 Epidermal cyst: Secondary | ICD-10-CM | POA: Diagnosis not present

## 2016-03-28 DIAGNOSIS — Z1151 Encounter for screening for human papillomavirus (HPV): Secondary | ICD-10-CM | POA: Diagnosis not present

## 2016-03-28 DIAGNOSIS — Z01419 Encounter for gynecological examination (general) (routine) without abnormal findings: Secondary | ICD-10-CM | POA: Diagnosis not present

## 2016-03-28 DIAGNOSIS — Z6837 Body mass index (BMI) 37.0-37.9, adult: Secondary | ICD-10-CM | POA: Diagnosis not present

## 2016-04-07 DIAGNOSIS — R1903 Right lower quadrant abdominal swelling, mass and lump: Secondary | ICD-10-CM | POA: Diagnosis not present

## 2016-04-07 DIAGNOSIS — R109 Unspecified abdominal pain: Secondary | ICD-10-CM | POA: Diagnosis not present

## 2016-04-07 DIAGNOSIS — N809 Endometriosis, unspecified: Secondary | ICD-10-CM | POA: Diagnosis not present

## 2016-04-08 ENCOUNTER — Other Ambulatory Visit: Payer: Self-pay | Admitting: Obstetrics and Gynecology

## 2016-04-08 DIAGNOSIS — N80129 Deep endometriosis of ovary, unspecified ovary: Secondary | ICD-10-CM

## 2016-04-08 DIAGNOSIS — R222 Localized swelling, mass and lump, trunk: Secondary | ICD-10-CM

## 2016-04-08 DIAGNOSIS — N809 Endometriosis, unspecified: Secondary | ICD-10-CM

## 2016-05-26 ENCOUNTER — Other Ambulatory Visit: Payer: Federal, State, Local not specified - PPO

## 2016-06-04 DIAGNOSIS — Z6834 Body mass index (BMI) 34.0-34.9, adult: Secondary | ICD-10-CM | POA: Diagnosis not present

## 2016-06-25 ENCOUNTER — Ambulatory Visit
Admission: RE | Admit: 2016-06-25 | Discharge: 2016-06-25 | Disposition: A | Discharge status: Home / Self Care | Payer: Federal, State, Local not specified - PPO | Source: Ambulatory Visit | Attending: Obstetrics and Gynecology | Admitting: Obstetrics and Gynecology

## 2016-06-25 DIAGNOSIS — R222 Localized swelling, mass and lump, trunk: Secondary | ICD-10-CM

## 2016-06-25 DIAGNOSIS — N80129 Deep endometriosis of ovary, unspecified ovary: Secondary | ICD-10-CM

## 2016-06-25 DIAGNOSIS — N809 Endometriosis, unspecified: Secondary | ICD-10-CM

## 2016-06-25 DIAGNOSIS — R1032 Left lower quadrant pain: Secondary | ICD-10-CM | POA: Diagnosis not present

## 2016-06-25 MED ORDER — IOPAMIDOL (ISOVUE-300) INJECTION 61%
100.0000 mL | Freq: Once | INTRAVENOUS | Status: AC | PRN
  Administered 2016-06-25: 100 mL via INTRAVENOUS

## 2016-07-02 DIAGNOSIS — R1903 Right lower quadrant abdominal swelling, mass and lump: Secondary | ICD-10-CM | POA: Diagnosis not present

## 2016-07-02 DIAGNOSIS — Z6834 Body mass index (BMI) 34.0-34.9, adult: Secondary | ICD-10-CM | POA: Diagnosis not present

## 2016-11-12 DIAGNOSIS — L700 Acne vulgaris: Secondary | ICD-10-CM | POA: Insufficient documentation

## 2016-11-12 DIAGNOSIS — L219 Seborrheic dermatitis, unspecified: Secondary | ICD-10-CM | POA: Insufficient documentation

## 2016-11-14 DIAGNOSIS — D509 Iron deficiency anemia, unspecified: Secondary | ICD-10-CM | POA: Diagnosis not present

## 2016-11-14 DIAGNOSIS — I10 Essential (primary) hypertension: Secondary | ICD-10-CM | POA: Diagnosis not present

## 2016-11-14 DIAGNOSIS — E559 Vitamin D deficiency, unspecified: Secondary | ICD-10-CM | POA: Diagnosis not present

## 2016-12-01 DIAGNOSIS — E669 Obesity, unspecified: Secondary | ICD-10-CM | POA: Diagnosis not present

## 2016-12-01 DIAGNOSIS — Z79899 Other long term (current) drug therapy: Secondary | ICD-10-CM | POA: Diagnosis not present

## 2016-12-01 DIAGNOSIS — I10 Essential (primary) hypertension: Secondary | ICD-10-CM | POA: Diagnosis not present

## 2017-03-26 DIAGNOSIS — L219 Seborrheic dermatitis, unspecified: Secondary | ICD-10-CM | POA: Diagnosis not present

## 2017-03-26 DIAGNOSIS — L700 Acne vulgaris: Secondary | ICD-10-CM | POA: Diagnosis not present

## 2017-04-24 ENCOUNTER — Other Ambulatory Visit: Payer: Self-pay

## 2017-04-24 DIAGNOSIS — Z1231 Encounter for screening mammogram for malignant neoplasm of breast: Secondary | ICD-10-CM

## 2017-04-27 ENCOUNTER — Ambulatory Visit
Admission: RE | Admit: 2017-04-27 | Discharge: 2017-04-27 | Disposition: A | Discharge status: Home / Self Care | Payer: Federal, State, Local not specified - PPO | Source: Ambulatory Visit | Attending: Obstetrics and Gynecology | Admitting: Obstetrics and Gynecology

## 2017-04-27 ENCOUNTER — Other Ambulatory Visit: Payer: Self-pay | Admitting: Obstetrics and Gynecology

## 2017-04-27 DIAGNOSIS — N63 Unspecified lump in unspecified breast: Secondary | ICD-10-CM

## 2017-04-27 DIAGNOSIS — N6321 Unspecified lump in the left breast, upper outer quadrant: Secondary | ICD-10-CM | POA: Diagnosis not present

## 2017-04-27 DIAGNOSIS — N6324 Unspecified lump in the left breast, lower inner quadrant: Secondary | ICD-10-CM | POA: Diagnosis not present

## 2017-04-27 DIAGNOSIS — N6323 Unspecified lump in the left breast, lower outer quadrant: Secondary | ICD-10-CM | POA: Diagnosis not present

## 2017-04-27 DIAGNOSIS — R922 Inconclusive mammogram: Secondary | ICD-10-CM | POA: Diagnosis not present

## 2017-04-27 DIAGNOSIS — N6311 Unspecified lump in the right breast, upper outer quadrant: Secondary | ICD-10-CM | POA: Diagnosis not present

## 2017-04-27 DIAGNOSIS — R599 Enlarged lymph nodes, unspecified: Secondary | ICD-10-CM

## 2017-04-28 ENCOUNTER — Other Ambulatory Visit: Payer: Self-pay | Admitting: Obstetrics and Gynecology

## 2017-04-28 DIAGNOSIS — N63 Unspecified lump in unspecified breast: Secondary | ICD-10-CM

## 2017-04-29 ENCOUNTER — Ambulatory Visit
Admission: RE | Admit: 2017-04-29 | Discharge: 2017-04-29 | Disposition: A | Discharge status: Home / Self Care | Payer: Federal, State, Local not specified - PPO | Source: Ambulatory Visit | Attending: Obstetrics and Gynecology | Admitting: Obstetrics and Gynecology

## 2017-04-29 DIAGNOSIS — N63 Unspecified lump in unspecified breast: Secondary | ICD-10-CM

## 2017-04-29 DIAGNOSIS — R590 Localized enlarged lymph nodes: Secondary | ICD-10-CM | POA: Diagnosis not present

## 2017-04-29 DIAGNOSIS — R599 Enlarged lymph nodes, unspecified: Secondary | ICD-10-CM

## 2017-04-29 DIAGNOSIS — N6323 Unspecified lump in the left breast, lower outer quadrant: Secondary | ICD-10-CM | POA: Diagnosis not present

## 2017-04-29 DIAGNOSIS — C50512 Malignant neoplasm of lower-outer quadrant of left female breast: Secondary | ICD-10-CM | POA: Diagnosis not present

## 2017-04-30 ENCOUNTER — Telehealth: Payer: Self-pay | Admitting: *Deleted

## 2017-04-30 NOTE — Telephone Encounter (Signed)
Left vm requesting return call regarding [facility] for 8/29. Contact information provided.

## 2017-05-01 ENCOUNTER — Telehealth: Payer: Self-pay | Admitting: *Deleted

## 2017-05-01 NOTE — Telephone Encounter (Signed)
Confirmed [facility] for 05/06/17 at 1215 .  Instructions and contact information given.

## 2017-05-01 NOTE — Telephone Encounter (Signed)
Left another msg for pt to return call to discuss [facility] appt for 8/29. Contact information provided.

## 2017-05-05 ENCOUNTER — Other Ambulatory Visit: Payer: Self-pay | Admitting: *Deleted

## 2017-05-05 DIAGNOSIS — C50512 Malignant neoplasm of lower-outer quadrant of left female breast: Secondary | ICD-10-CM | POA: Insufficient documentation

## 2017-05-05 DIAGNOSIS — Z171 Estrogen receptor negative status [ER-]: Principal | ICD-10-CM

## 2017-05-06 ENCOUNTER — Encounter: Payer: Self-pay | Admitting: Hematology

## 2017-05-06 ENCOUNTER — Other Ambulatory Visit (HOSPITAL_BASED_OUTPATIENT_CLINIC_OR_DEPARTMENT_OTHER): Payer: Federal, State, Local not specified - PPO

## 2017-05-06 ENCOUNTER — Ambulatory Visit: Payer: Federal, State, Local not specified - PPO | Attending: General Surgery | Admitting: Physical Therapy

## 2017-05-06 ENCOUNTER — Encounter: Payer: Self-pay | Admitting: Physical Therapy

## 2017-05-06 ENCOUNTER — Ambulatory Visit
Admission: RE | Admit: 2017-05-06 | Discharge: 2017-05-06 | Disposition: A | Discharge status: Home / Self Care | Payer: Federal, State, Local not specified - PPO | Source: Ambulatory Visit | Attending: Radiation Oncology | Admitting: Radiation Oncology

## 2017-05-06 ENCOUNTER — Other Ambulatory Visit: Payer: Self-pay | Admitting: General Surgery

## 2017-05-06 ENCOUNTER — Ambulatory Visit (HOSPITAL_BASED_OUTPATIENT_CLINIC_OR_DEPARTMENT_OTHER): Payer: Federal, State, Local not specified - PPO | Admitting: Hematology

## 2017-05-06 VITALS — BP 137/76 | HR 78 | Temp 98.3°F | Resp 18 | Ht 67.0 in | Wt 218.9 lb

## 2017-05-06 DIAGNOSIS — Z171 Estrogen receptor negative status [ER-]: Principal | ICD-10-CM

## 2017-05-06 DIAGNOSIS — C50512 Malignant neoplasm of lower-outer quadrant of left female breast: Secondary | ICD-10-CM | POA: Insufficient documentation

## 2017-05-06 DIAGNOSIS — I10 Essential (primary) hypertension: Secondary | ICD-10-CM

## 2017-05-06 DIAGNOSIS — R293 Abnormal posture: Secondary | ICD-10-CM

## 2017-05-06 DIAGNOSIS — Z803 Family history of malignant neoplasm of breast: Secondary | ICD-10-CM | POA: Diagnosis not present

## 2017-05-06 LAB — CBC WITH DIFFERENTIAL/PLATELET
BASO%: 0.8 % (ref 0.0–2.0)
Basophils Absolute: 0.1 10*3/uL (ref 0.0–0.1)
EOS ABS: 0.2 10*3/uL (ref 0.0–0.5)
EOS%: 2.3 % (ref 0.0–7.0)
HCT: 36.2 % (ref 34.8–46.6)
HEMOGLOBIN: 11.9 g/dL (ref 11.6–15.9)
LYMPH%: 28.8 % (ref 14.0–49.7)
MCH: 26.5 pg (ref 25.1–34.0)
MCHC: 32.9 g/dL (ref 31.5–36.0)
MCV: 80.7 fL (ref 79.5–101.0)
MONO#: 0.8 10*3/uL (ref 0.1–0.9)
MONO%: 9.2 % (ref 0.0–14.0)
NEUT%: 58.9 % (ref 38.4–76.8)
NEUTROS ABS: 5 10*3/uL (ref 1.5–6.5)
Platelets: 435 10*3/uL — ABNORMAL HIGH (ref 145–400)
RBC: 4.49 10*6/uL (ref 3.70–5.45)
RDW: 13.6 % (ref 11.2–14.5)
WBC: 8.4 10*3/uL (ref 3.9–10.3)
lymph#: 2.4 10*3/uL (ref 0.9–3.3)

## 2017-05-06 LAB — COMPREHENSIVE METABOLIC PANEL
ALBUMIN: 3.7 g/dL (ref 3.5–5.0)
ALK PHOS: 70 U/L (ref 40–150)
ALT: 20 U/L (ref 0–55)
AST: 21 U/L (ref 5–34)
Anion Gap: 8 mEq/L (ref 3–11)
BILIRUBIN TOTAL: 0.61 mg/dL (ref 0.20–1.20)
BUN: 7.2 mg/dL (ref 7.0–26.0)
CO2: 30 mEq/L — ABNORMAL HIGH (ref 22–29)
CREATININE: 0.7 mg/dL (ref 0.6–1.1)
Calcium: 10 mg/dL (ref 8.4–10.4)
Chloride: 101 mEq/L (ref 98–109)
GLUCOSE: 110 mg/dL (ref 70–140)
Potassium: 4.2 mEq/L (ref 3.5–5.1)
SODIUM: 140 meq/L (ref 136–145)
TOTAL PROTEIN: 8.1 g/dL (ref 6.4–8.3)

## 2017-05-06 NOTE — Patient Instructions (Signed)

## 2017-05-06 NOTE — Progress Notes (Addendum)
[place]  [phone] Fax:([phone] [place] Note   Patient Care Team: [name], MD as PCP - General (Family Medicine) [name], MD as Consulting Physician (Hematology) [name], MD as Consulting Physician (General Surgery) [name], MD as Consulting Physician (Radiation Oncology) 05/06/2017  CHIEF COMPLAINTS/PURPOSE OF CONSULTATION:  Malignant neoplasm of lower-outer quadrant of left breast of female, triple negative   Oncology History   Cancer Staging Malignant neoplasm of lower-outer quadrant of left breast of female, estrogen receptor negative ([place]) Staging form: Breast, AJCC 8th Edition - Clinical: Stage IIB (cT2, cN0(f), cM0, G3, ER: Negative, PR: Negative, HER2: Negative) - Unsigned       Malignant neoplasm of lower-outer quadrant of left breast of female, estrogen receptor negative ([place])   04/27/2017 Mammogram    IMPRESSION: 1. Highly suspicious mass within the LEFT breast at the 5:30 o'clock axis, 4 cm from the nipple, measuring 2.5 cm, corresponding to the mammographic finding and corresponding to 1 of the palpable lumps in the left breast. Ultrasound-guided biopsy is recommended. 2. Mildly prominent lymph node in the LEFT axilla, with normal cortical thickness but with questionable effacement of the fatty hilum. Ultrasound-guided core biopsy is recommended. 3. No evidence of malignancy within the RIGHT breast. Also, no evidence of malignancy within the second area of clinical concern in the upper outer left breast.      04/29/2017 Initial Diagnosis    Malignant neoplasm of lower-outer quadrant of left breast of female, estrogen receptor negative ([place])      04/29/2017 Receptors her2    Estrogen Receptor: 0%, NEGATIVE Progesterone Receptor: 0%, NEGATIVE Proliferation Marker Ki67: 90% HER2 NEGATIVE       04/29/2017 Initial Biopsy     Diagnosis 1. Breast, left, needle core biopsy, 5:30 o'clock,  mass - INVASIVE DUCTAL CARCINOMA, G2-3 - DUCTAL CARCINOMA IN SITU. - SEE COMMENT. 2. Lymph node, needle/core biopsy, left axilla - THERE IN NO EVIDENCE OF CARCINOMA IN 1 OF 1 LYMPH NODE (0/1).        HISTORY OF PRESENTING ILLNESS: 8/29/8  Briana Price 51 y.o. 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  She presents to the clinic today with her mother and father.  She first noticed her a mass in her left breast on the 17th of August, It was a hard knot, no pain or tenderness. Her mammogram form 03/24/17 and other previous mammograms were all normal.   In the past she was diagnosed with HTN and takes hydrochlorothiazide and lisinopril. She had her gallbladder removed. Her maternal grandmother, maternal aunt and paternal aunt had breast cancer. 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 The pain can last up to 3 days.   Today she denies skin change around breast, pain, fatigue or weight loss. She still has regular periods that last 3 days. She work in [employer] for the [employer].    GYN HISTORY  Menarchal: 14 LMP: 05/01/17 Contraceptive: No HRT: n/a GP: G1P1 at age 38, she has a son   MEDICAL HISTORY:  Past Medical History:  Diagnosis Date  . Hypertension     SURGICAL HISTORY: Past Surgical History:  Procedure Laterality Date  . CESAREAN SECTION    . CHOLECYSTECTOMY N/A 02/07/2013   Procedure: LAPAROSCOPIC CHOLECYSTECTOMY WITH INTRAOPERATIVE CHOLANGIOGRAM;  Surgeon: Gwenyth Ober, MD;  Location: [facility];  Service: General;  Laterality: N/A;  . FOOT SURGERY  2003   lt foot bunionectomy  SOCIAL HISTORY: Social History   Social History  . Marital status: Single    Spouse name: N/A  . Number of children: N/A  . Years of education: N/A   Occupational History  . Not on file.   Social History Main Topics  . Smoking status: Never Smoker  . Smokeless tobacco: Never Used  . Alcohol use No  . Drug use: No  . Sexual  activity: Not on file   Other Topics Concern  . Not on file   Social History Narrative   Tobacco use   Cigarettes: Never smoked    Tobacco history last updated 01/23/2014   No smoking   No tobacco exposure   No alcohol   Caffeine : yes soda   No recreational drug use   Occupation :employed ,[employer] 01/25/2013   Martial status : single    Children: Louie Casa 01/25/2013   65 year old son    FAMILY HISTORY: Family History  Problem Relation Age of Onset  . Breast cancer Maternal Grandmother   . Breast cancer Paternal Aunt   . Breast cancer Maternal Aunt     ALLERGIES:  has No Known Allergies.  MEDICATIONS:  Current Outpatient Prescriptions  Medication Sig Dispense Refill  . cholecalciferol (VITAMIN D) 1000 UNITS tablet Take 2,000 Units by mouth daily. 2000     . hydrochlorothiazide (HYDRODIURIL) 25 MG tablet Take 25 mg by mouth daily.    Marland Kitchen lisinopril (PRINIVIL,ZESTRIL) 10 MG tablet TK 1 T PO QD  2   No current facility-administered medications for this visit.     REVIEW OF SYSTEMS:   Constitutional: Denies fevers, chills or abnormal night sweats Eyes: Denies blurriness of vision, double vision or watery eyes Ears, nose, mouth, throat, and face: Denies mucositis or sore throat Respiratory: Denies cough, dyspnea or wheezes Cardiovascular: Denies palpitation, chest discomfort or lower extremity swelling Gastrointestinal:  Denies nausea, heartburn or change in bowel habits Skin: Denies abnormal skin rashes Lymphatics: Denies new lymphadenopathy or easy bruising Neurological:Denies numbness, tingling or new weaknesses Behavioral/Psych: Mood is stable, no new changes. Breast: (+) Palpable lump in right breast All other systems were reviewed with the patient and are negative.    PHYSICAL EXAMINATION: ECOG PERFORMANCE STATUS: 0 - Asymptomatic  Vitals:   05/06/17 1301  BP: 137/76  Pulse: 78  Resp: 18  Temp: 98.3 F (36.8 C)  SpO2: 99%   Filed Weights    05/06/17 1301  Weight: 218 lb 14.4 oz (99.3 kg)    GENERAL:alert, no distress and comfortable SKIN: skin color, texture, turgor are normal, no rashes or significant lesions EYES: normal, conjunctiva are pink and non-injected, sclera clear OROPHARYNX:no exudate, no erythema and lips, buccal mucosa, and tongue normal  NECK: supple, thyroid normal size, non-tender, without nodularity LYMPH:  no palpable lymphadenopathy in the cervical, axillary or inguinal LUNGS: clear to auscultation and percussion with normal breathing effort HEART: regular rate & rhythm and no murmurs and no lower extremity edema ABDOMEN:abdomen soft, non-tender and normal bowel sounds Musculoskeletal:no cyanosis of digits and no clubbing  PSYCH: alert & oriented x 3 with fluent speech NEURO: no focal motor/sensory deficits Breast: (+) no palpable mass in left axilla (+) palpable mass in 7:00 position Inner lower quadrant 2x3 cm with skin ecchymosis.  (+) no palpable mass in right breast or right axilla, normal   LABORATORY DATA:  I have reviewed the data as listed CBC Latest Ref Rng & Units 05/06/2017 02/07/2013  WBC 3.9 - 10.3 10e3/uL  8.4 -  Hemoglobin 11.6 - 15.9 g/dL 11.9 12.6  Hematocrit 34.8 - 46.6 % 36.2 -  Platelets 145 - 400 10e3/uL 435(H) -    CMP Latest Ref Rng & Units 05/06/2017 02/04/2013  Glucose 70 - 140 mg/dl 110 92  BUN 7.0 - 26.0 mg/dL 7.2 12  Creatinine 0.6 - 1.1 mg/dL 0.7 0.66  Sodium 136 - 145 mEq/L 140 136  Potassium 3.5 - 5.1 mEq/L 4.2 3.8  Chloride 96 - 112 mEq/L - 100  CO2 22 - 29 mEq/L 30(H) 26  Calcium 8.4 - 10.4 mg/dL 10.0 9.3  Total Protein 6.4 - 8.3 g/dL 8.1 -  Total Bilirubin 0.20 - 1.20 mg/dL 0.61 -  Alkaline Phos 40 - 150 U/L 70 -  AST 5 - 34 U/L 21 -  ALT 0 - 55 U/L 20 -     RADIOGRAPHIC STUDIES: I have personally reviewed the radiological images as listed and agreed with the findings in the report. US Breast Ltd Uni Left Inc Axilla  Result Date: 04/27/2017 CLINICAL DATA:   Patient describes palpable masses within each breast (1 on the right and 2 on the left). EXAM: 2D DIGITAL DIAGNOSTIC BILATERAL MAMMOGRAM WITH CAD AND ADJUNCT TOMO ULTRASOUND BILATERAL BREAST COMPARISON:  Previous exam(s). ACR Breast Density Category c: The breast tissue is heterogeneously dense, which may obscure small masses. FINDINGS: There is an irregular mass within the inferior left breast, at posterior depth, measuring approximately 2 cm greatest dimension. There are no additional masses, suspicious calcifications or secondary signs of malignancy within either breast. Specifically, there are no mammographic abnormalities within the outer breasts bilaterally corresponding to the additional areas of clinical concern. Mammographic images were processed with CAD. LEFT breast: Targeted ultrasound is performed, showing an irregular hypoechoic mass in the left breast at the 5:30 o'clock axis, 4 cm from the nipple, measuring 2.5 x 1.6 x 2 cm, corresponding to the mammographic finding and corresponding to 1 of the sites of clinical concern. Targeted ultrasound also performed of the outer left breast corresponding to the additional area of clinical concern, showing only normal fibroglandular tissues and fat lobules throughout. RIGHT breast: Targeted ultrasound is performed, evaluating the outer right breast corresponding to the area of clinical concern, showing only normal fibroglandular tissues and fat lobules. No suspicious solid or cystic mass is identified. LEFT axilla was evaluated with ultrasound showing a single mildly prominent lymph node, measuring 1.3 cm greatest dimension, with normal cortical thickness of 3.5 mm but with questionable effacement of the fatty hilum. IMPRESSION: 1. Highly suspicious mass within the LEFT breast at the 5:30 o'clock axis, 4 cm from the nipple, measuring 2.5 cm, corresponding to the mammographic finding and corresponding to 1 of the palpable lumps in the left breast.  Ultrasound-guided biopsy is recommended. 2. Mildly prominent lymph node in the LEFT axilla, with normal cortical thickness but with questionable effacement of the fatty hilum. Ultrasound-guided core biopsy is recommended. 3. No evidence of malignancy within the RIGHT breast. Also, no evidence of malignancy within the second area of clinical concern in the upper outer left breast. RECOMMENDATION: 1. Ultrasound-guided biopsy of the left breast mass at the 5:30 o'clock axis. 2. Ultrasound-guided biopsy of the single mildly prominent lymph node in the left axilla. Ultrasound-guided biopsies are scheduled for August 22nd. I have discussed the findings and recommendations with the patient. Results were also provided in writing at the conclusion of the visit. If applicable, a reminder letter will be sent to the patient regarding the next  appointment. BI-RADS CATEGORY  5: Highly suggestive of malignancy. Electronically Signed   By: Franki Cabot M.D.   On: 04/27/2017 16:40   US Breast Ltd Uni Right Inc Axilla  Result Date: 04/27/2017 CLINICAL DATA:  Patient describes palpable masses within each breast (1 on the right and 2 on the left). EXAM: 2D DIGITAL DIAGNOSTIC BILATERAL MAMMOGRAM WITH CAD AND ADJUNCT TOMO ULTRASOUND BILATERAL BREAST COMPARISON:  Previous exam(s). ACR Breast Density Category c: The breast tissue is heterogeneously dense, which may obscure small masses. FINDINGS: There is an irregular mass within the inferior left breast, at posterior depth, measuring approximately 2 cm greatest dimension. There are no additional masses, suspicious calcifications or secondary signs of malignancy within either breast. Specifically, there are no mammographic abnormalities within the outer breasts bilaterally corresponding to the additional areas of clinical concern. Mammographic images were processed with CAD. LEFT breast: Targeted ultrasound is performed, showing an irregular hypoechoic mass in the left breast at the  5:30 o'clock axis, 4 cm from the nipple, measuring 2.5 x 1.6 x 2 cm, corresponding to the mammographic finding and corresponding to 1 of the sites of clinical concern. Targeted ultrasound also performed of the outer left breast corresponding to the additional area of clinical concern, showing only normal fibroglandular tissues and fat lobules throughout. RIGHT breast: Targeted ultrasound is performed, evaluating the outer right breast corresponding to the area of clinical concern, showing only normal fibroglandular tissues and fat lobules. No suspicious solid or cystic mass is identified. LEFT axilla was evaluated with ultrasound showing a single mildly prominent lymph node, measuring 1.3 cm greatest dimension, with normal cortical thickness of 3.5 mm but with questionable effacement of the fatty hilum. IMPRESSION: 1. Highly suspicious mass within the LEFT breast at the 5:30 o'clock axis, 4 cm from the nipple, measuring 2.5 cm, corresponding to the mammographic finding and corresponding to 1 of the palpable lumps in the left breast. Ultrasound-guided biopsy is recommended. 2. Mildly prominent lymph node in the LEFT axilla, with normal cortical thickness but with questionable effacement of the fatty hilum. Ultrasound-guided core biopsy is recommended. 3. No evidence of malignancy within the RIGHT breast. Also, no evidence of malignancy within the second area of clinical concern in the upper outer left breast. RECOMMENDATION: 1. Ultrasound-guided biopsy of the left breast mass at the 5:30 o'clock axis. 2. Ultrasound-guided biopsy of the single mildly prominent lymph node in the left axilla. Ultrasound-guided biopsies are scheduled for August 22nd. I have discussed the findings and recommendations with the patient. Results were also provided in writing at the conclusion of the visit. If applicable, a reminder letter will be sent to the patient regarding the next appointment. BI-RADS CATEGORY  5: Highly suggestive of  malignancy. Electronically Signed   By: Franki Cabot M.D.   On: 04/27/2017 16:40   Mm Diag Breast Tomo Bilateral  Result Date: 04/27/2017 CLINICAL DATA:  Patient describes palpable masses within each breast (1 on the right and 2 on the left). EXAM: 2D DIGITAL DIAGNOSTIC BILATERAL MAMMOGRAM WITH CAD AND ADJUNCT TOMO ULTRASOUND BILATERAL BREAST COMPARISON:  Previous exam(s). ACR Breast Density Category c: The breast tissue is heterogeneously dense, which may obscure small masses. FINDINGS: There is an irregular mass within the inferior left breast, at posterior depth, measuring approximately 2 cm greatest dimension. There are no additional masses, suspicious calcifications or secondary signs of malignancy within either breast. Specifically, there are no mammographic abnormalities within the outer breasts bilaterally corresponding to the additional areas of clinical concern. Mammographic  images were processed with CAD. LEFT breast: Targeted ultrasound is performed, showing an irregular hypoechoic mass in the left breast at the 5:30 o'clock axis, 4 cm from the nipple, measuring 2.5 x 1.6 x 2 cm, corresponding to the mammographic finding and corresponding to 1 of the sites of clinical concern. Targeted ultrasound also performed of the outer left breast corresponding to the additional area of clinical concern, showing only normal fibroglandular tissues and fat lobules throughout. RIGHT breast: Targeted ultrasound is performed, evaluating the outer right breast corresponding to the area of clinical concern, showing only normal fibroglandular tissues and fat lobules. No suspicious solid or cystic mass is identified. LEFT axilla was evaluated with ultrasound showing a single mildly prominent lymph node, measuring 1.3 cm greatest dimension, with normal cortical thickness of 3.5 mm but with questionable effacement of the fatty hilum. IMPRESSION: 1. Highly suspicious mass within the LEFT breast at the 5:30 o'clock axis, 4  cm from the nipple, measuring 2.5 cm, corresponding to the mammographic finding and corresponding to 1 of the palpable lumps in the left breast. Ultrasound-guided biopsy is recommended. 2. Mildly prominent lymph node in the LEFT axilla, with normal cortical thickness but with questionable effacement of the fatty hilum. Ultrasound-guided core biopsy is recommended. 3. No evidence of malignancy within the RIGHT breast. Also, no evidence of malignancy within the second area of clinical concern in the upper outer left breast. RECOMMENDATION: 1. Ultrasound-guided biopsy of the left breast mass at the 5:30 o'clock axis. 2. Ultrasound-guided biopsy of the single mildly prominent lymph node in the left axilla. Ultrasound-guided biopsies are scheduled for August 22nd. I have discussed the findings and recommendations with the patient. Results were also provided in writing at the conclusion of the visit. If applicable, a reminder letter will be sent to the patient regarding the next appointment. BI-RADS CATEGORY  5: Highly suggestive of malignancy. Electronically Signed   By: Franki Cabot M.D.   On: 04/27/2017 16:40   [place] Axillary Node Core Biopsy Left  Addendum Date: 05/01/2017   ADDENDUM REPORT: 04/30/2017 14:44 ADDENDUM: Pathology revealed GRADE II-III INVASIVE DUCTAL CARCINOMA, DUCTAL CARCINOMA IN SITU of the Left breast, 5:30 o'clock. THERE IN NO EVIDENCE OF CARCINOMA of the Left axillary lymph node. This was found to be concordant by Dr. Lajean Manes. Pathology results were discussed with the patient by telephone. The patient reported doing well after the biopsies with tenderness at the sites. Post biopsy instructions and care were reviewed and questions were answered. The patient was encouraged to call [facility] for any additional concerns. The patient was referred to [facility] at [facility] on May 06, 2017. Pathology  results reported by Terie Purser, RN on 04/30/2017. Electronically Signed   By: Lajean Manes M.D.   On: 04/30/2017 14:44   Result Date: 05/01/2017 CLINICAL DATA:  Patient presents for ultrasound-guided core needle biopsy of a 5:30 o'clock position left breast mass and a borderline abnormal left axillary lymph node. EXAM: ULTRASOUND GUIDED LEFT BREAST CORE NEEDLE BIOPSY ULTRASOUND GUIDED LEFT AXILLA CORE NEEDLE BIOPSY COMPARISON:  Previous exam(s). FINDINGS: I met with the patient and we discussed the procedure of ultrasound-guided biopsy, including benefits and alternatives. We discussed the high likelihood of a successful procedure. We discussed the risks of the procedure, including infection, bleeding, tissue injury, clip migration, and inadequate sampling. Informed written consent was given. The usual time-out protocol was performed immediately prior to the procedure. Lesion quadrant:  Lower outer quadrant Using sterile technique and 1% Lidocaine as local anesthetic, under direct ultrasound visualization, a 12 gauge spring-loaded device was used to perform biopsy of 5:30 o'clock position left breast mass using a medial approach. At the conclusion of the procedure a ribbon shaped tissue marker clip was deployed into the biopsy cavity. Using sterile technique and 1% Lidocaine as local anesthetic, under direct ultrasound visualization, a 14 gauge spring-loaded device was used to perform biopsy of the borderline abnormal left axillary lymph node using an inferior approach. At the conclusion of the procedure a HydroMARK tissue marker clip was deployed into the biopsy cavity. Follow up 2 view mammogram was performed and dictated separately. IMPRESSION: Ultrasound guided biopsy of a left breast mass and a borderline abnormal left axillary lymph node. No apparent complications. Electronically Signed: By: Lajean Manes M.D. On: 04/29/2017 10:24   Mm Clip Placement Left  Result Date: 04/29/2017 CLINICAL DATA:  Status  post ultrasound-guided core needle biopsy of a left breast mass and a borderline abnormal left axillary lymph node. EXAM: DIAGNOSTIC LEFT MAMMOGRAM POST ULTRASOUND BIOPSY COMPARISON:  Previous exam(s). FINDINGS: Mammographic images were obtained following ultrasound guided biopsy of left breast mass and a left axillary lymph node. The ribbon shaped biopsy clip lies within the center of the left breast mass. The [facility] clip is seen on the MLO view either within or adjacent to the borderline abnormal lymph node. IMPRESSION: Well-positioned ribbon shaped biopsy clip within the left breast mass. HydroMARK biopsy clip lies within or adjacent to the left axillary lymph node. Final Assessment: Post Procedure Mammograms for Marker Placement Electronically Signed   By: Lajean Manes M.D.   On: 04/29/2017 10:43   [place] Lt Breast Bx W Loc Dev 1st Lesion Img Bx Spec US Guide  Addendum Date: 05/01/2017   ADDENDUM REPORT: 04/30/2017 14:44 ADDENDUM: Pathology revealed GRADE II-III INVASIVE DUCTAL CARCINOMA, DUCTAL CARCINOMA IN SITU of the Left breast, 5:30 o'clock. THERE IN NO EVIDENCE OF CARCINOMA of the Left axillary lymph node. This was found to be concordant by Dr. Lajean Manes. Pathology results were discussed with the patient by telephone. The patient reported doing well after the biopsies with tenderness at the sites. Post biopsy instructions and care were reviewed and questions were answered. The patient was encouraged to call [facility] for any additional concerns. The patient was referred to [facility] at [facility] on May 06, 2017. Pathology results reported by Terie Purser, RN on 04/30/2017. Electronically Signed   By: Lajean Manes M.D.   On: 04/30/2017 14:44   Result Date: 05/01/2017 CLINICAL DATA:  Patient presents for ultrasound-guided core needle biopsy of a 5:30 o'clock position left breast mass and a borderline  abnormal left axillary lymph node. EXAM: ULTRASOUND GUIDED LEFT BREAST CORE NEEDLE BIOPSY ULTRASOUND GUIDED LEFT AXILLA CORE NEEDLE BIOPSY COMPARISON:  Previous exam(s). FINDINGS: I met with the patient and we discussed the procedure of ultrasound-guided biopsy, including benefits and alternatives. We discussed the high likelihood of a successful procedure. We discussed the risks of the procedure, including infection, bleeding, tissue injury, clip migration, and inadequate sampling. Informed written consent was given. The usual time-out protocol was performed immediately prior to the procedure. Lesion quadrant: Lower outer quadrant Using sterile technique and 1% Lidocaine as local anesthetic, under direct ultrasound visualization, a 12 gauge spring-loaded device was used to perform biopsy of 5:30 o'clock position left breast mass using a medial approach. At the  conclusion of the procedure a ribbon shaped tissue marker clip was deployed into the biopsy cavity. Using sterile technique and 1% Lidocaine as local anesthetic, under direct ultrasound visualization, a 14 gauge spring-loaded device was used to perform biopsy of the borderline abnormal left axillary lymph node using an inferior approach. At the conclusion of the procedure a HydroMARK tissue marker clip was deployed into the biopsy cavity. Follow up 2 view mammogram was performed and dictated separately. IMPRESSION: Ultrasound guided biopsy of a left breast mass and a borderline abnormal left axillary lymph node. No apparent complications. Electronically Signed: By: Lajean Manes M.D. On: 04/29/2017 10:24    ASSESSMENT & PLAN:  Briana Price is a 51 y.o. african-american female with a history of HTN and arthritis in her back, presented with a palpable left breast mass.  1. 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  -We discussed her  mammogram, ultrasound findings, and initial biopsy results with patient and her family members in details.  -She presented with a palpable left breast mass, measures 2.5 cm on ultrasound, with a prominent lymph node. 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 -She is a candidate for lumpectomy and sentinel lymph node biopsy. She was seen by surgeon Dr. [name] today. Due to her young age and triple negative disease, we recommended her to see genetics, to ruled out he had apologetic syndrome. -We discussed the natural history of triple negative breast cancer, which is more aggressive, and the high-risk of recurrence after surgical resection.   -Due to her early stage it is likely her cancer can be cured with surgery, along with chemotherapy and radiation.  -We discussed neoadjuvant versus adjuvant chemotherapy. The benefit of neoadjuvant chemotherapy to evaluate her response, sooner to start systemic therapy,etc. both Dr. [name] and I recommend her to consider new adjuvant chemotherapy. Patient initially wants to have upfront surgery, after a lengthy discussion, she agreed to proceed with neoadjuvant chemotherapy. -I discussed the new adjuvant chemotherapy regimens, especially with AC-T and TC.  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   --Chemotherapy consent: Side effects including but does not not limited to, fatigue, nausea, vomiting, diarrhea, hair loss, neuropathy, fluid retention, renal and kidney dysfunction, neutropenic fever, needed for blood transfusion, bleeding, heart failure, small risk of leukemia or MDS, were discussed with patient in great detail. She agreed to proceed. -The goal of therapy is curative -I discussed chemotherapy-induced infertility, patient and her husband do not wish to have  more children -I recommend a CT scan and bone scan first for staging   -She will need a port placed, chemo class and ECHO before starting chemotherapy.  We'll tentatively start her chemotherapy in 2 weeks. -If she undergo lumpectomy, she would also benefit from adjuvant breast radiation. She was seen by Dr. Lisbeth Renshaw today. -We reviewed breast cancer surveillance after she completes the above treatment.    2. Genetics -Based on her significant familial breast cancer history, her young age and triple negative disease,  I suggest she gets genetic testing for possible gene mutations.  -I will refer to Genetics  3. HTN -she is on HCTZand lisinopril , we'll continue, she will follow-up with her primary care physician  -We discussed the impact of chemotherapy,  blood pressure, and a possibility that we may hold her blood pressure medication if she gets dehydrated.   PLAN:  --Refer to Genetics -wil proceed with neoadjuvant chemotherpy AC every 2 weeks for 4 cycles, followed by paclitaxel every 2 weeks for 4 cycles.  -Port placement by Dr. [name]   echocardiogram  -CT chest, abdomen and pelvis with contrast, and a bone scan for staging -Chemotherapy class  -Lab, port flush, follow-up and first cycle chemotherapy AC in 2 weeks      Orders Placed This Encounter  Procedures  . CT Abdomen Pelvis W Contrast    Standing Status:   Future    Standing Expiration Date:   05/06/2018    Order Specific Question:   If indicated for the ordered procedure, I authorize the administration of contrast media per Radiology protocol    Answer:   Yes    Order Specific Question:   Is patient pregnant?    Answer:   No    Order Specific Question:   Preferred imaging location?    Answer:   [facility]    Order Specific Question:   Radiology Contrast Protocol - do NOT remove file path    Answer:   _0 [email or web address]    Order Specific Question:   Reason for Exam additional comments     Answer:   new breast cancer staging/neoadjuvant chemo  . CT Chest W Contrast    Standing Status:   Future    Standing Expiration Date:   05/06/2018    Order Specific Question:   If indicated for the ordered procedure, I authorize the administration of contrast media per Radiology protocol    Answer:   Yes    Order Specific Question:   Is patient pregnant?    Answer:   No    Order Specific Question:   Preferred imaging location?    Answer:   [facility]    Order Specific Question:   Radiology Contrast Protocol - do NOT remove file path    Answer:   _1 [email or web address]    Order Specific Question:   Reason for Exam additional comments    Answer:   new breast cancer staging/neoadjuvant chemo  . NM Bone Scan Whole Body    Standing Status:   Future    Standing Expiration Date:   05/06/2018    Order Specific Question:   If indicated for the ordered procedure, I authorize the administration of a radiopharmaceutical per Radiology protocol    Answer:   Yes    Order Specific Question:   Is the patient pregnant?    Answer:   No    Order Specific Question:   Preferred imaging location?    Answer:   [facility]    Order Specific Question:   Radiology Contrast Protocol - do NOT remove file path    Answer:   _2 [email or web address]    Order Specific Question:   Reason for Exam additional comments    Answer:   new breast cancer staging/neoadjuvant chemo    Order Specific Question:   Is patient pregnant?    Answer:   No  . MR BREAST BILATERAL W WO CONTRAST    Epic order Labs drawn on 05/06/17  Malignant neoplasm of lower-outer quadrant of left breast of female, estrogen receptor negative (Tensed)- new breast cancer Pf- 04/29/17 @ bcg Wt. 218/ ht. 5'7/ claus/no metal in eyes/ no bullet or shrapnel/ no breast sx/ no eyes,ears,brain or heart sx/ no diabetes/ no kidney or  liver da/htn/ nkda to mri cm/ no lupus,ra, scleroderma/ no implants/ no  needs Ins- bcbs Pda/ptVarney Biles and pt      Standing Status:   Future    Standing Expiration Date:   07/06/2018    Order Specific Question:   If indicated for the ordered procedure, I authorize the administration of contrast media per Radiology protocol    Answer:   Yes    Order Specific Question:   What is the patient's sedation requirement?    Answer:   No Sedation    Order Specific Question:   Does the patient have a pacemaker or implanted devices?    Answer:   No    Order Specific Question:   Radiology Contrast Protocol - do NOT remove file path    Answer:   _0 [email or web address]    Order Specific Question:   Reason for Exam additional comments    Answer:   new breast cancer/neoadjuvant chemo    Order Specific Question:   Preferred imaging location?    Answer:   [facility] (table limit-350 lbs)    Order Specific Question:   Is patient pregnant?    Answer:   No  . ECHOCARDIOGRAM COMPLETE    Standing Status:   Future    Standing Expiration Date:   08/06/2018    Order Specific Question:   Where should this test be performed    Answer:   [facility]    Order Specific Question:   Perflutren DEFINITY (image enhancing agent) should be administered unless hypersensitivity or allergy exist    Answer:   Administer Perflutren    Order Specific Question:   Expected Date:    Answer:   1 week    All questions were answered. The patient knows to call the clinic with any problems, questions or concerns. I spent 55 minutes counseling the patient face to face. The total time spent in the appointment was 60 minutes and more than 50% was on counseling.  This document serves as a record of services personally performed by [name], MD. It was created on her behalf by Joslyn Devon, a trained medical scribe. The creation of this record is based on the scribe's personal observations and the provider's statements to them. This document has been checked and approved by  the attending provider.      [name], MD 05/06/2017

## 2017-05-06 NOTE — Progress Notes (Signed)
Nutrition Assessment  Reason for Assessment:  Pt seen in Breast Clinic  ASSESSMENT:   51 year old female with breast cancer.  Past medical history reviewed.    Medications:  reviewed  Labs: reviewed  Anthropometrics:   Height:67 inches  Weight: 218 lb 14.4 oz BMI: 34   NUTRITION DIAGNOSIS: Food and nutrition related knowledge deficit related to new diagnosis of breast cancer as evidenced by no prior need for nutrition related information.  INTERVENTION:   Discussed and provided packet of information regarding nutritional tips for breast cancer patients.  Questions answered.  Teachback method used.  Contact information provided and patient knows to contact me with questions/concerns.    MONITORING, EVALUATION, and GOAL: Pt will consume a healthy plant based diet to maintain lean body mass throughout treatment.   [employer] B. Zenia Resides, [place], [place] Registered Dietitian [phone] (pager)

## 2017-05-06 NOTE — Progress Notes (Signed)
Radiation Oncology         [phone]) [phone] ________________________________  Name: Briana Price        MRN: [id number]  Date of Service: 05/06/2017 DOB: 1966/07/26  CC:[name], MD  [name], MD     REFERRING PHYSICIAN: [name], MD   DIAGNOSIS: The encounter diagnosis was Malignant neoplasm of lower-outer quadrant of left breast of female, estrogen receptor negative ([place]).   HISTORY OF PRESENT ILLNESS: Briana Price is a 51 y.o. female seen in the multidisciplinary breast clinic for a new diagnosis of left breast cancer. She was found to have a palpable left as well as right breast mass, and proceeded with mammogram which revealed a 2 cm mass in the left breast, but no findings were seen of concern in the right breast. Diagnostic imaging on 04/27/17 revealed  A 2.5 x 1.6 x 2 cm mass at 5:30. The left axilla revealed a 1.3 cm lymph node. 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 She comes today to discuss options of treatment.  PREVIOUS RADIATION THERAPY: No   PAST MEDICAL HISTORY:  Past Medical History:  Diagnosis Date  . GERD (gastroesophageal reflux disease)    occ-no meds  . Hypertension        PAST SURGICAL HISTORY: Past Surgical History:  Procedure Laterality Date  . CESAREAN SECTION    . CHOLECYSTECTOMY N/A 02/07/2013   Procedure: LAPAROSCOPIC CHOLECYSTECTOMY WITH INTRAOPERATIVE CHOLANGIOGRAM;  Surgeon: Gwenyth Ober, MD;  Location: [place];  Service: General;  Laterality: N/A;  . FOOT SURGERY  2003   lt foot bunionectomy     FAMILY HISTORY:  Family History  Problem Relation Age of Onset  . Breast cancer Maternal Grandmother   . Breast cancer Paternal Aunt   . Breast cancer Maternal Aunt      SOCIAL HISTORY:  reports that she has never smoked. She does not have any smokeless tobacco  history on file. She reports that she does not drink alcohol or use drugs. The patient is single and works for the [employer]. She has a teenage son, and is accompanied by her parents.   ALLERGIES: Patient has no known allergies.   MEDICATIONS:  Current Outpatient Prescriptions  Medication Sig Dispense Refill  . Aspirin-Salicylamide-Caffeine (BC HEADACHE POWDER PO) Take by mouth.    . cholecalciferol (VITAMIN D) 1000 UNITS tablet Take 1,000 Units by mouth daily. 2000    . ferrous fumarate (FERRO-SEQUELS) 50 MG CR tablet Take 50 mg by mouth 3 (three) times daily with meals.    . hydrochlorothiazide (HYDRODIURIL) 25 MG tablet Take 25 mg by mouth daily.    . meloxicam (MOBIC) 15 MG tablet Take 15 mg by mouth daily.    Marland Kitchen oxyCODONE-acetaminophen (ROXICET) 5-325 MG per tablet Take 1 tablet by mouth every 4 (four) hours as needed for pain. (Patient not taking: Reported on 01/16/2015) 30 tablet 0   No current facility-administered medications for this encounter.      REVIEW OF SYSTEMS: On review of systems, the patient reports that she is doing well overall. She denies any chest pain, shortness of breath, cough, fevers, chills, night sweats, unintended weight changes. She denies any bowel or bladder disturbances, and denies abdominal pain, nausea or vomiting. She denies any new musculoskeletal or joint aches or pains. A complete review of  systems is obtained and is otherwise negative.     PHYSICAL EXAM:  Wt Readings from Last 3 Encounters:  02/07/13 221 lb (100.2 kg)  01/11/13 220 lb (99.8 kg)   Temp Readings from Last 3 Encounters:  02/07/13 98.6 F (37 C) (Oral)  01/11/13 97.2 F (36.2 C)   BP Readings from Last 3 Encounters:  02/07/13 131/79  01/11/13 124/86   Pulse Readings from Last 3 Encounters:  02/07/13 78  01/11/13 76    In general this is a well appearing African American female in no acute distress. She is alert and oriented x4 and appropriate throughout the  examination. HEENT reveals that the patient is normocephalic, atraumatic. EOMs are intact. PERRLA. Skin is intact without any evidence of gross lesions. Cardiovascular exam reveals a regular rate and rhythm, no clicks rubs or murmurs are auscultated. Chest is clear to auscultation bilaterally. Lymphatic assessment is performed and does not reveal any adenopathy in the cervical, supraclavicular, axillary, or inguinal chains. Abdomen has active bowel sounds in all quadrants and is intact. The abdomen is soft, non tender, non distended. Lower extremities are negative for pretibial pitting edema, deep calf tenderness, cyanosis or clubbing.   ECOG = 0  0 - Asymptomatic (Fully active, able to carry on all predisease activities without restriction)  1 - Symptomatic but completely ambulatory (Restricted in physically strenuous activity but ambulatory and able to carry out work of a light or sedentary nature. For example, light housework, office work)  2 - Symptomatic, <50% in bed during the day (Ambulatory and capable of all self care but unable to carry out any work activities. Up and about more than 50% of waking hours)  3 - Symptomatic, >50% in bed, but not bedbound (Capable of only limited self-care, confined to bed or chair 50% or more of waking hours)  4 - Bedbound (Completely disabled. Cannot carry on any self-care. Totally confined to bed or chair)  5 - Death   Eustace Pen MM, Creech RH, Tormey DC, et al. [phone]). "Toxicity and response criteria of the [facility]". [place] Oncol. 5 (6): 649-55    LABORATORY DATA:  Lab Results  Component Value Date   HGB 12.6 02/07/2013   Lab Results  Component Value Date   NA 136 02/04/2013   K 3.8 02/04/2013   CL 100 02/04/2013   CO2 26 02/04/2013   No results found for: ALT, AST, GGT, ALKPHOS, BILITOT    RADIOGRAPHY: US Breast Ltd Uni Left Inc Axilla  Result Date: 04/27/2017 CLINICAL DATA:  Patient describes palpable  masses within each breast (1 on the right and 2 on the left). EXAM: 2D DIGITAL DIAGNOSTIC BILATERAL MAMMOGRAM WITH CAD AND ADJUNCT TOMO ULTRASOUND BILATERAL BREAST COMPARISON:  Previous exam(s). ACR Breast Density Category c: The breast tissue is heterogeneously dense, which may obscure small masses. FINDINGS: There is an irregular mass within the inferior left breast, at posterior depth, measuring approximately 2 cm greatest dimension. There are no additional masses, suspicious calcifications or secondary signs of malignancy within either breast. Specifically, there are no mammographic abnormalities within the outer breasts bilaterally corresponding to the additional areas of clinical concern. Mammographic images were processed with CAD. LEFT breast: Targeted ultrasound is performed, showing an irregular hypoechoic mass in the left breast at the 5:30 o'clock axis, 4 cm from the nipple, measuring 2.5 x 1.6 x 2 cm, corresponding to the mammographic finding and corresponding to 1 of the sites of clinical concern. Targeted ultrasound also performed  of the outer left breast corresponding to the additional area of clinical concern, showing only normal fibroglandular tissues and fat lobules throughout. RIGHT breast: Targeted ultrasound is performed, evaluating the outer right breast corresponding to the area of clinical concern, showing only normal fibroglandular tissues and fat lobules. No suspicious solid or cystic mass is identified. LEFT axilla was evaluated with ultrasound showing a single mildly prominent lymph node, measuring 1.3 cm greatest dimension, with normal cortical thickness of 3.5 mm but with questionable effacement of the fatty hilum. IMPRESSION: 1. Highly suspicious mass within the LEFT breast at the 5:30 o'clock axis, 4 cm from the nipple, measuring 2.5 cm, corresponding to the mammographic finding and corresponding to 1 of the palpable lumps in the left breast. Ultrasound-guided biopsy is recommended.  2. Mildly prominent lymph node in the LEFT axilla, with normal cortical thickness but with questionable effacement of the fatty hilum. Ultrasound-guided core biopsy is recommended. 3. No evidence of malignancy within the RIGHT breast. Also, no evidence of malignancy within the second area of clinical concern in the upper outer left breast. RECOMMENDATION: 1. Ultrasound-guided biopsy of the left breast mass at the 5:30 o'clock axis. 2. Ultrasound-guided biopsy of the single mildly prominent lymph node in the left axilla. Ultrasound-guided biopsies are scheduled for August 22nd. I have discussed the findings and recommendations with the patient. Results were also provided in writing at the conclusion of the visit. If applicable, a reminder letter will be sent to the patient regarding the next appointment. BI-RADS CATEGORY  5: Highly suggestive of malignancy. Electronically Signed   By: Franki Cabot M.D.   On: 04/27/2017 16:40   US Breast Ltd Uni Right Inc Axilla  Result Date: 04/27/2017 CLINICAL DATA:  Patient describes palpable masses within each breast (1 on the right and 2 on the left). EXAM: 2D DIGITAL DIAGNOSTIC BILATERAL MAMMOGRAM WITH CAD AND ADJUNCT TOMO ULTRASOUND BILATERAL BREAST COMPARISON:  Previous exam(s). ACR Breast Density Category c: The breast tissue is heterogeneously dense, which may obscure small masses. FINDINGS: There is an irregular mass within the inferior left breast, at posterior depth, measuring approximately 2 cm greatest dimension. There are no additional masses, suspicious calcifications or secondary signs of malignancy within either breast. Specifically, there are no mammographic abnormalities within the outer breasts bilaterally corresponding to the additional areas of clinical concern. Mammographic images were processed with CAD. LEFT breast: Targeted ultrasound is performed, showing an irregular hypoechoic mass in the left breast at the 5:30 o'clock axis, 4 cm from the nipple,  measuring 2.5 x 1.6 x 2 cm, corresponding to the mammographic finding and corresponding to 1 of the sites of clinical concern. Targeted ultrasound also performed of the outer left breast corresponding to the additional area of clinical concern, showing only normal fibroglandular tissues and fat lobules throughout. RIGHT breast: Targeted ultrasound is performed, evaluating the outer right breast corresponding to the area of clinical concern, showing only normal fibroglandular tissues and fat lobules. No suspicious solid or cystic mass is identified. LEFT axilla was evaluated with ultrasound showing a single mildly prominent lymph node, measuring 1.3 cm greatest dimension, with normal cortical thickness of 3.5 mm but with questionable effacement of the fatty hilum. IMPRESSION: 1. Highly suspicious mass within the LEFT breast at the 5:30 o'clock axis, 4 cm from the nipple, measuring 2.5 cm, corresponding to the mammographic finding and corresponding to 1 of the palpable lumps in the left breast. Ultrasound-guided biopsy is recommended. 2. Mildly prominent lymph node in the LEFT axilla, with  normal cortical thickness but with questionable effacement of the fatty hilum. Ultrasound-guided core biopsy is recommended. 3. No evidence of malignancy within the RIGHT breast. Also, no evidence of malignancy within the second area of clinical concern in the upper outer left breast. RECOMMENDATION: 1. Ultrasound-guided biopsy of the left breast mass at the 5:30 o'clock axis. 2. Ultrasound-guided biopsy of the single mildly prominent lymph node in the left axilla. Ultrasound-guided biopsies are scheduled for August 22nd. I have discussed the findings and recommendations with the patient. Results were also provided in writing at the conclusion of the visit. If applicable, a reminder letter will be sent to the patient regarding the next appointment. BI-RADS CATEGORY  5: Highly suggestive of malignancy. Electronically Signed   By:  Franki Cabot M.D.   On: 04/27/2017 16:40   Mm Diag Breast Tomo Bilateral  Result Date: 04/27/2017 CLINICAL DATA:  Patient describes palpable masses within each breast (1 on the right and 2 on the left). EXAM: 2D DIGITAL DIAGNOSTIC BILATERAL MAMMOGRAM WITH CAD AND ADJUNCT TOMO ULTRASOUND BILATERAL BREAST COMPARISON:  Previous exam(s). ACR Breast Density Category c: The breast tissue is heterogeneously dense, which may obscure small masses. FINDINGS: There is an irregular mass within the inferior left breast, at posterior depth, measuring approximately 2 cm greatest dimension. There are no additional masses, suspicious calcifications or secondary signs of malignancy within either breast. Specifically, there are no mammographic abnormalities within the outer breasts bilaterally corresponding to the additional areas of clinical concern. Mammographic images were processed with CAD. LEFT breast: Targeted ultrasound is performed, showing an irregular hypoechoic mass in the left breast at the 5:30 o'clock axis, 4 cm from the nipple, measuring 2.5 x 1.6 x 2 cm, corresponding to the mammographic finding and corresponding to 1 of the sites of clinical concern. Targeted ultrasound also performed of the outer left breast corresponding to the additional area of clinical concern, showing only normal fibroglandular tissues and fat lobules throughout. RIGHT breast: Targeted ultrasound is performed, evaluating the outer right breast corresponding to the area of clinical concern, showing only normal fibroglandular tissues and fat lobules. No suspicious solid or cystic mass is identified. LEFT axilla was evaluated with ultrasound showing a single mildly prominent lymph node, measuring 1.3 cm greatest dimension, with normal cortical thickness of 3.5 mm but with questionable effacement of the fatty hilum. IMPRESSION: 1. Highly suspicious mass within the LEFT breast at the 5:30 o'clock axis, 4 cm from the nipple, measuring 2.5 cm,  corresponding to the mammographic finding and corresponding to 1 of the palpable lumps in the left breast. Ultrasound-guided biopsy is recommended. 2. Mildly prominent lymph node in the LEFT axilla, with normal cortical thickness but with questionable effacement of the fatty hilum. Ultrasound-guided core biopsy is recommended. 3. No evidence of malignancy within the RIGHT breast. Also, no evidence of malignancy within the second area of clinical concern in the upper outer left breast. RECOMMENDATION: 1. Ultrasound-guided biopsy of the left breast mass at the 5:30 o'clock axis. 2. Ultrasound-guided biopsy of the single mildly prominent lymph node in the left axilla. Ultrasound-guided biopsies are scheduled for August 22nd. I have discussed the findings and recommendations with the patient. Results were also provided in writing at the conclusion of the visit. If applicable, a reminder letter will be sent to the patient regarding the next appointment. BI-RADS CATEGORY  5: Highly suggestive of malignancy. Electronically Signed   By: Franki Cabot M.D.   On: 04/27/2017 16:40   [place] Axillary Node Core Biopsy  Left  Addendum Date: 05/01/2017   ADDENDUM REPORT: 04/30/2017 14:44 ADDENDUM: Pathology revealed GRADE II-III INVASIVE DUCTAL CARCINOMA, DUCTAL CARCINOMA IN SITU of the Left breast, 5:30 o'clock. THERE IN NO EVIDENCE OF CARCINOMA of the Left axillary lymph node. This was found to be concordant by Dr. Lajean Manes. Pathology results were discussed with the patient by telephone. The patient reported doing well after the biopsies with tenderness at the sites. Post biopsy instructions and care were reviewed and questions were answered. The patient was encouraged to call [facility] for any additional concerns. The patient was referred to [facility] on May 06, 2017. Pathology results reported by Terie Purser, RN on  04/30/2017. Electronically Signed   By: Lajean Manes M.D.   On: 04/30/2017 14:44   Result Date: 05/01/2017 CLINICAL DATA:  Patient presents for ultrasound-guided core needle biopsy of a 5:30 o'clock position left breast mass and a borderline abnormal left axillary lymph node. EXAM: ULTRASOUND GUIDED LEFT BREAST CORE NEEDLE BIOPSY ULTRASOUND GUIDED LEFT AXILLA CORE NEEDLE BIOPSY COMPARISON:  Previous exam(s). FINDINGS: I met with the patient and we discussed the procedure of ultrasound-guided biopsy, including benefits and alternatives. We discussed the high likelihood of a successful procedure. We discussed the risks of the procedure, including infection, bleeding, tissue injury, clip migration, and inadequate sampling. Informed written consent was given. The usual time-out protocol was performed immediately prior to the procedure. Lesion quadrant: Lower outer quadrant Using sterile technique and 1% Lidocaine as local anesthetic, under direct ultrasound visualization, a 12 gauge spring-loaded device was used to perform biopsy of 5:30 o'clock position left breast mass using a medial approach. At the conclusion of the procedure a ribbon shaped tissue marker clip was deployed into the biopsy cavity. Using sterile technique and 1% Lidocaine as local anesthetic, under direct ultrasound visualization, a 14 gauge spring-loaded device was used to perform biopsy of the borderline abnormal left axillary lymph node using an inferior approach. At the conclusion of the procedure a HydroMARK tissue marker clip was deployed into the biopsy cavity. Follow up 2 view mammogram was performed and dictated separately. IMPRESSION: Ultrasound guided biopsy of a left breast mass and a borderline abnormal left axillary lymph node. No apparent complications. Electronically Signed: By: Lajean Manes M.D. On: 04/29/2017 10:24   Mm Clip Placement Left  Result Date: 04/29/2017 CLINICAL DATA:  Status post ultrasound-guided core needle  biopsy of a left breast mass and a borderline abnormal left axillary lymph node. EXAM: DIAGNOSTIC LEFT MAMMOGRAM POST ULTRASOUND BIOPSY COMPARISON:  Previous exam(s). FINDINGS: Mammographic images were obtained following ultrasound guided biopsy of left breast mass and a left axillary lymph node. The ribbon shaped biopsy clip lies within the center of the left breast mass. The [facility] clip is seen on the MLO view either within or adjacent to the borderline abnormal lymph node. IMPRESSION: Well-positioned ribbon shaped biopsy clip within the left breast mass. HydroMARK biopsy clip lies within or adjacent to the left axillary lymph node. Final Assessment: Post Procedure Mammograms for Marker Placement Electronically Signed   By: Lajean Manes M.D.   On: 04/29/2017 10:43   [place] Lt Breast Bx W Loc Dev 1st Lesion Img Bx Spec US Guide  Addendum Date: 05/01/2017   ADDENDUM REPORT: 04/30/2017 14:44 ADDENDUM: Pathology revealed GRADE II-III INVASIVE DUCTAL CARCINOMA, DUCTAL CARCINOMA IN SITU of the Left breast, 5:30 o'clock. THERE IN NO EVIDENCE OF CARCINOMA of the Left axillary  lymph node. This was found to be concordant by Dr. Lajean Manes. Pathology results were discussed with the patient by telephone. The patient reported doing well after the biopsies with tenderness at the sites. Post biopsy instructions and care were reviewed and questions were answered. The patient was encouraged to call [facility] for any additional concerns. The patient was referred to [facility] on May 06, 2017. Pathology results reported by Terie Purser, RN on 04/30/2017. Electronically Signed   By: Lajean Manes M.D.   On: 04/30/2017 14:44   Result Date: 05/01/2017 CLINICAL DATA:  Patient presents for ultrasound-guided core needle biopsy of a 5:30 o'clock position left breast mass and a borderline abnormal left axillary lymph node.  EXAM: ULTRASOUND GUIDED LEFT BREAST CORE NEEDLE BIOPSY ULTRASOUND GUIDED LEFT AXILLA CORE NEEDLE BIOPSY COMPARISON:  Previous exam(s). FINDINGS: I met with the patient and we discussed the procedure of ultrasound-guided biopsy, including benefits and alternatives. We discussed the high likelihood of a successful procedure. We discussed the risks of the procedure, including infection, bleeding, tissue injury, clip migration, and inadequate sampling. Informed written consent was given. The usual time-out protocol was performed immediately prior to the procedure. Lesion quadrant: Lower outer quadrant Using sterile technique and 1% Lidocaine as local anesthetic, under direct ultrasound visualization, a 12 gauge spring-loaded device was used to perform biopsy of 5:30 o'clock position left breast mass using a medial approach. At the conclusion of the procedure a ribbon shaped tissue marker clip was deployed into the biopsy cavity. Using sterile technique and 1% Lidocaine as local anesthetic, under direct ultrasound visualization, a 14 gauge spring-loaded device was used to perform biopsy of the borderline abnormal left axillary lymph node using an inferior approach. At the conclusion of the procedure a HydroMARK tissue marker clip was deployed into the biopsy cavity. Follow up 2 view mammogram was performed and dictated separately. IMPRESSION: Ultrasound guided biopsy of a left breast mass and a borderline abnormal left axillary lymph node. No apparent complications. Electronically Signed: By: Lajean Manes M.D. On: 04/29/2017 10:24       IMPRESSION/PLAN: 1. 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 Dr. Lisbeth Renshaw discusses the pathology findings and reviews the nature of invasive breast disease. The consensus from the breast conference includes neoadjvuant therapy due to her triple negative status, as well as genetic testing. She is interested in breast  conservation with lumpectomy with sentinel mapping, and her course would be followed by external radiotherapy to the breast. We discussed the risks, benefits, short, and long term effects of radiotherapy, and the patient is interested in proceeding. Dr. Lisbeth Renshaw discusses the delivery and logistics of radiotherapy and would offer her 6 1/2 weeks of treatment. We will see her back about 2 weeks after surgery to move forward with the simulation and planning process and anticipate starting radiotherapy about 4 weeks after surgery.  2. Possible genetic predisposition to malignancy. The patient is planning to proceed with genetic testing. We will follow up with these results when available.  The above documentation reflects my direct findings during this shared patient visit. Please see the separate note by Dr. Lisbeth Renshaw on this date for the remainder of the patient's plan of care.    Carola Rhine, PAC

## 2017-05-06 NOTE — Therapy (Signed)
[place], [place], [address] Phone: [phone]   Fax:  [phone]  Physical Therapy Evaluation  Patient Details  Name: Briana Price MRN: [id number] Date of Birth: [birth year] Referring Provider: Dr. [name]  Encounter Date: 05/06/2017      PT End of Session - 05/06/17 2055    Visit Number 1   Number of Visits 1   PT Start Time 1522   PT Stop Time 4166  Also saw pt from 1552-1605 for a total of 21 minutes   PT Time Calculation (min) 8 min   Activity Tolerance Patient tolerated treatment well   Behavior During Therapy [facility] for tasks assessed/performed      Past Medical History:  Diagnosis Date  . Hypertension     Past Surgical History:  Procedure Laterality Date  . CESAREAN SECTION    . CHOLECYSTECTOMY N/A 02/07/2013   Procedure: LAPAROSCOPIC CHOLECYSTECTOMY WITH INTRAOPERATIVE CHOLANGIOGRAM;  Surgeon: Gwenyth Ober, MD;  Location: [facility];  Service: General;  Laterality: N/A;  . FOOT SURGERY  2003   lt foot bunionectomy    There were no vitals filed for this visit.       Subjective Assessment - 05/06/17 2047    Subjective Patient reports she is here today to be seen by her medical team for her newly diagnosed left breast cancer.   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 It measures 2.5 cm and is located in the lower outer quadrant and has a Ki67 of 90%. Other medical problems include diabetes and hypertension.   Patient Stated Goals reduce lymphedema risk and learn post op shoulder ROM HEP   Currently in Pain? No/denies            [facility] PT Assessment - 05/06/17 0001      Assessment   Medical Diagnosis Left breast cancer   Referring Provider Dr. [name]   Onset Date/Surgical Date 04/27/17   Hand Dominance Right   Prior Therapy none     Precautions   Precautions Other (comment)   Precaution Comments active cancer     Restrictions   Weight Bearing Restrictions No     Balance Screen   Has the patient fallen in the past 6 months No   Has the patient had a decrease in activity level because of a fear of falling?  No   Is the patient reluctant to leave their home because of a fear of falling?  No     Home Environment   Living Environment Private residence   Living Arrangements Children;Non-relatives/Friends  25 y.o. son and his Father   Available Help at Discharge Family  Parents present today for appointment     Prior Function   Level of Independence Independent   Vocation Full time employment   Vocation Requirements Works in [employer] at [employer]; desk job   Leisure She does not exercise     Cognition   Overall Cognitive Status Within Functional Limits for tasks assessed     Posture/Postural Control   Posture/Postural Control Postural limitations   Postural Limitations Rounded Shoulders;Forward head     ROM / Strength   AROM / PROM / Strength AROM;Strength     AROM   AROM Assessment Site Shoulder;Cervical   Right/Left Shoulder Right;Left   Right Shoulder Extension 47 Degrees   Right Shoulder Flexion 152  Degrees   Right Shoulder ABduction 152 Degrees   Right Shoulder Internal Rotation 80 Degrees   Right Shoulder External Rotation 80 Degrees   Left Shoulder Extension 63 Degrees   Left Shoulder Flexion 160 Degrees   Left Shoulder ABduction 157 Degrees   Left Shoulder Internal Rotation 79 Degrees   Left Shoulder External Rotation 85 Degrees   Cervical Flexion WNL   Cervical Extension WNL   Cervical - Right Side Bend WNL   Cervical - Left Side Bend WNL   Cervical - Right Rotation WNL   Cervical - Left Rotation WNL     Strength   Overall Strength Within functional limits for tasks performed           LYMPHEDEMA/ONCOLOGY QUESTIONNAIRE - 05/06/17 2053      Type   Cancer Type Left  breast cancer     Lymphedema Assessments   Lymphedema Assessments Upper extremities     Right Upper Extremity Lymphedema   10 cm Proximal to Olecranon Process 34.3 cm   Olecranon Process 27.8 cm   10 cm Proximal to Ulnar Styloid Process 22.8 cm   Just Proximal to Ulnar Styloid Process 16.5 cm   Across Hand at [employer] 19.4 cm   At [place] of 2nd Digit 6.7 cm     Left Upper Extremity Lymphedema   10 cm Proximal to Olecranon Process 34.6 cm   Olecranon Process 28.1 cm   10 cm Proximal to Ulnar Styloid Process 22.2 cm   Just Proximal to Ulnar Styloid Process 16.3 cm   Across Hand at [employer] 18.7 cm   At [place] of 2nd Digit 6.1 cm         Objective measurements completed on examination: See above findings.     Patient was instructed today in a home exercise program today for post op shoulder range of motion. These included active assist shoulder flexion in sitting, scapular retraction, wall walking with shoulder abduction, and hands behind head external rotation.  She was encouraged to do these twice a day, holding 3 seconds and repeating 5 times when permitted by her physician.         PT Education - 05/06/17 2054    Education provided Yes   Education Details Lymphedema risk reduction and post op shoulder ROM HEP   Person(s) Educated Patient;Parent(s)   Methods Explanation;Demonstration;Handout   Comprehension Returned demonstration;Verbalized understanding              Breast Clinic Goals - 05/06/17 2059      Patient will be able to verbalize understanding of pertinent lymphedema risk reduction practices relevant to her diagnosis specifically related to skin care.   Time 1   Period Days   Status Achieved     Patient will be able to return demonstrate and/or verbalize understanding of the post-op home exercise program related to regaining shoulder range of motion.   Time 1   Period Days   Status Achieved     Patient will be able to verbalize  understanding of the importance of attending the postoperative After Breast Cancer Class for further lymphedema risk reduction education and therapeutic exercise.   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 It measures 2.5 cm and is located  in the lower outer quadrant and has a Ki67 of 90%. Other medical problems include diabetes and hypertension. Her multidisciplinary medical team met prior to her assessments to determine a recommended treatment plan. She is planning to have neoadjuvant chemotherapy followed by genetic testing, a left lumpectomy and sentinel node biopsy and radiation.   History and Personal Factors relevant to plan of care: None   Clinical Presentation Stable   Clinical Decision Making Low   Rehab Potential Excellent   Clinical Impairments Affecting Rehab Potential None   PT Frequency One time visit   PT Treatment/Interventions Patient/family education;Therapeutic exercise   PT Next Visit Plan Will f/u as needed post operatively to determine PT needs   PT Home Exercise Plan Post op shoulder ROM HEP   Consulted and Agree with Plan of Care Patient;Family member/caregiver   Family Member Consulted Parents      Patient will benefit from skilled therapeutic intervention in order to improve the following deficits and impairments:  Decreased range of motion, Impaired UE functional use, Pain, Decreased knowledge of precautions, Postural dysfunction  Visit Diagnosis: Carcinoma of lower-outer quadrant of left breast in female, estrogen receptor negative ([place]) - Plan: PT plan of care cert/re-cert  Abnormal posture - Plan: PT plan of care cert/re-cert   Patient will follow up at outpatient cancer rehab if needed following surgery.  If the patient requires physical therapy at that time, a specific plan  will be dictated and sent to the referring physician for approval. The patient was educated today on appropriate basic range of motion exercises to begin post operatively and the importance of attending the After Breast Cancer class following surgery.  Patient was educated today on lymphedema risk reduction practices as it pertains to recommendations that will benefit the patient immediately following surgery.  She verbalized good understanding.  No additional physical therapy is indicated at this time.      Problem List Patient Active Problem List   Diagnosis Date Noted  . Malignant neoplasm of lower-outer quadrant of left breast of female, estrogen receptor negative ([place]) 05/05/2017  . Cholelithiasis 01/11/2013    Annia Friendly, PT 05/06/17 9:01 PM   [place], [place], [address] Phone: [phone]   Fax:  [phone]  Name: Briana Price MRN: [id number] Date of Birth: [birth year]

## 2017-05-07 ENCOUNTER — Encounter: Payer: Self-pay | Admitting: General Practice

## 2017-05-07 ENCOUNTER — Telehealth: Payer: Self-pay | Admitting: *Deleted

## 2017-05-07 NOTE — Telephone Encounter (Signed)
  Oncology Nurse Navigator Documentation  Navigator Location: [facility] (05/07/17 1200)   )Navigator Encounter Type: Telephone;MDC Follow-up (05/07/17 1200) Telephone: Outgoing Call;Clinic/MDC Follow-up (05/07/17 1200)                                                  Time Spent with Patient: 30 (05/07/17 1200)

## 2017-05-07 NOTE — Progress Notes (Signed)
Briana Price Psychosocial Distress Screening Briana Price presented to Breast [facility] on 05/06/2017.  The patient scored a 5 on the Psychosocial Distress Thermometer which indicates moderate distress. She received full packet of [facility] team/resource information.  ONCBCN DISTRESS SCREENING 05/07/2017  Screening Type Initial Screening  Distress experienced in past week (1-10) 5  Emotional problem type Nervousness/Anxiety  Information Concerns Type Lack of info about diagnosis;Lack of info about treatment  Referral to support programs Yes    Follow up needed: Yes.  Plan to f/u by phone to assess needs based on distress screen, offer emotional support, and make referrals as needed.   [place], [place], [facility] Pager [phone] Voicemail [phone]

## 2017-05-08 ENCOUNTER — Telehealth: Payer: Self-pay | Admitting: Hematology

## 2017-05-08 ENCOUNTER — Encounter: Payer: Self-pay | Admitting: General Practice

## 2017-05-08 ENCOUNTER — Other Ambulatory Visit: Payer: Self-pay | Admitting: Hematology

## 2017-05-08 DIAGNOSIS — Z171 Estrogen receptor negative status [ER-]: Principal | ICD-10-CM

## 2017-05-08 DIAGNOSIS — C50512 Malignant neoplasm of lower-outer quadrant of left female breast: Secondary | ICD-10-CM

## 2017-05-08 MED ORDER — LIDOCAINE-PRILOCAINE 2.5-2.5 % EX CREA: TOPICAL_CREAM | CUTANEOUS | 3 refills | Status: DC

## 2017-05-08 MED ORDER — ONDANSETRON HCL 8 MG PO TABS: 8.0000 mg | ORAL_TABLET | Freq: Two times a day (BID) | ORAL | 1 refills | Status: DC | PRN

## 2017-05-08 MED ORDER — PROCHLORPERAZINE MALEATE 10 MG PO TABS: 10.0000 mg | ORAL_TABLET | Freq: Four times a day (QID) | ORAL | 1 refills | Status: DC | PRN

## 2017-05-08 NOTE — Addendum Note (Signed)
Addended by: [name] on: 05/08/2017 10:23 AM   Modules accepted: Orders

## 2017-05-08 NOTE — Telephone Encounter (Signed)
Scheduled appt per 8/30 sch message - unable to schedule on the 9/12 date due to capped day - per [place] schedule on 9/14 - Dr. Burr Medico and patient aware.

## 2017-05-08 NOTE — Progress Notes (Signed)
START ON PATHWAY REGIMEN - Breast   Doxorubicin + Cyclophosphamide (AC):   A cycle is every 21 days:     Doxorubicin      Cyclophosphamide   **Always confirm dose/schedule in your pharmacy ordering system**    Paclitaxel 80 mg/m2 Weekly:   Administer weekly:     Paclitaxel   **Always confirm dose/schedule in your pharmacy ordering system**    Patient Characteristics: Preoperative or Nonsurgical Candidate (Clinical Staging), Neoadjuvant Therapy followed by Surgery, Invasive Disease, Chemotherapy, HER2 Negative/Unknown/Equivocal, ER Negative/Unknown, Platinum Therapy Not Indicated Therapeutic Status: Preoperative or Nonsurgical Candidate (Clinical Staging) AJCC M Category: cM0 AJCC Grade: G3 Breast Surgical Plan: Neoadjuvant Therapy followed by Surgery ER Status: Negative (-) AJCC 8 Stage Grouping: IIB HER2 Status: Negative (-) AJCC T Category: cT2 AJCC N Category: cN0 PR Status: Negative (-) Type of Therapy: Platinum Therapy Not Indicated Intent of Therapy: Curative Intent, Discussed with Patient

## 2017-05-08 NOTE — Progress Notes (Signed)
[facility] Spiritual Care Note  Followed up with Briana Price by phone to offer further support and ensure that she is aware of [facility] team/programming availability.  She reports great support from family/friends, welcomes availability of other resources, and plans to return call for more detailed conversation.   [place], [place], [facility] Pager [phone] Voicemail [phone]

## 2017-05-13 ENCOUNTER — Telehealth: Payer: Self-pay | Admitting: *Deleted

## 2017-05-13 DIAGNOSIS — Z171 Estrogen receptor negative status [ER-]: Secondary | ICD-10-CM | POA: Diagnosis not present

## 2017-05-13 DIAGNOSIS — C50512 Malignant neoplasm of lower-outer quadrant of left female breast: Secondary | ICD-10-CM | POA: Diagnosis not present

## 2017-05-13 NOTE — Telephone Encounter (Signed)
Received call from patient stating she has had a 2nd opinion at [facility] and would like to discuss treatment options with Dr. Burr Medico.  Appt made for 05/19/17 at 9am.

## 2017-05-14 ENCOUNTER — Ambulatory Visit (HOSPITAL_COMMUNITY)
Admission: RE | Admit: 2017-05-14 | Discharge: 2017-05-14 | Disposition: A | Discharge status: Home / Self Care | Payer: Federal, State, Local not specified - PPO | Source: Ambulatory Visit | Attending: Hematology | Admitting: Hematology

## 2017-05-14 ENCOUNTER — Other Ambulatory Visit: Payer: Federal, State, Local not specified - PPO

## 2017-05-14 ENCOUNTER — Ambulatory Visit
Admission: RE | Admit: 2017-05-14 | Discharge: 2017-05-14 | Disposition: A | Discharge status: Home / Self Care | Payer: Federal, State, Local not specified - PPO | Source: Ambulatory Visit | Attending: Hematology | Admitting: Hematology

## 2017-05-14 ENCOUNTER — Encounter: Payer: Self-pay | Admitting: *Deleted

## 2017-05-14 DIAGNOSIS — N6314 Unspecified lump in the right breast, lower inner quadrant: Secondary | ICD-10-CM | POA: Diagnosis not present

## 2017-05-14 DIAGNOSIS — I10 Essential (primary) hypertension: Secondary | ICD-10-CM | POA: Diagnosis not present

## 2017-05-14 DIAGNOSIS — N6324 Unspecified lump in the left breast, lower inner quadrant: Secondary | ICD-10-CM | POA: Diagnosis not present

## 2017-05-14 DIAGNOSIS — Z171 Estrogen receptor negative status [ER-]: Secondary | ICD-10-CM | POA: Diagnosis not present

## 2017-05-14 DIAGNOSIS — C50512 Malignant neoplasm of lower-outer quadrant of left female breast: Secondary | ICD-10-CM

## 2017-05-14 MED ORDER — PERFLUTREN LIPID MICROSPHERE
1.0000 mL | INTRAVENOUS | Status: AC | PRN
  Administered 2017-05-14: 2 mL via INTRAVENOUS
  Filled 2017-05-14: qty 10

## 2017-05-14 MED ORDER — PERFLUTREN LIPID MICROSPHERE
INTRAVENOUS | Status: AC
  Filled 2017-05-14: qty 10

## 2017-05-14 NOTE — Progress Notes (Signed)
  Echocardiogram 2D Echocardiogram with Definity has been performed.  Tresa Res 05/14/2017, 11:18 AM

## 2017-05-15 ENCOUNTER — Encounter (HOSPITAL_COMMUNITY): Payer: Federal, State, Local not specified - PPO

## 2017-05-15 ENCOUNTER — Ambulatory Visit (HOSPITAL_COMMUNITY)
Admission: RE | Admit: 2017-05-15 | Discharge: 2017-05-15 | Disposition: A | Discharge status: Home / Self Care | Payer: Federal, State, Local not specified - PPO | Source: Ambulatory Visit | Attending: Hematology | Admitting: Hematology

## 2017-05-15 ENCOUNTER — Encounter (HOSPITAL_COMMUNITY)
Admission: RE | Admit: 2017-05-15 | Discharge: 2017-05-15 | Disposition: A | Discharge status: Home / Self Care | Payer: Federal, State, Local not specified - PPO | Source: Ambulatory Visit | Attending: Hematology | Admitting: Hematology

## 2017-05-15 DIAGNOSIS — Z171 Estrogen receptor negative status [ER-]: Secondary | ICD-10-CM | POA: Insufficient documentation

## 2017-05-15 DIAGNOSIS — C773 Secondary and unspecified malignant neoplasm of axilla and upper limb lymph nodes: Secondary | ICD-10-CM | POA: Insufficient documentation

## 2017-05-15 DIAGNOSIS — I700 Atherosclerosis of aorta: Secondary | ICD-10-CM | POA: Diagnosis not present

## 2017-05-15 DIAGNOSIS — D7389 Other diseases of spleen: Secondary | ICD-10-CM | POA: Diagnosis not present

## 2017-05-15 DIAGNOSIS — R911 Solitary pulmonary nodule: Secondary | ICD-10-CM | POA: Insufficient documentation

## 2017-05-15 DIAGNOSIS — D259 Leiomyoma of uterus, unspecified: Secondary | ICD-10-CM | POA: Insufficient documentation

## 2017-05-15 DIAGNOSIS — C50512 Malignant neoplasm of lower-outer quadrant of left female breast: Secondary | ICD-10-CM | POA: Insufficient documentation

## 2017-05-15 DIAGNOSIS — C50912 Malignant neoplasm of unspecified site of left female breast: Secondary | ICD-10-CM | POA: Diagnosis not present

## 2017-05-15 MED ORDER — IOPAMIDOL (ISOVUE-300) INJECTION 61%
100.0000 mL | Freq: Once | INTRAVENOUS | Status: AC | PRN
  Administered 2017-05-15: 100 mL via INTRAVENOUS

## 2017-05-15 MED ORDER — TECHNETIUM TC 99M MEDRONATE IV KIT
18.6000 | PACK | Freq: Once | INTRAVENOUS | Status: AC | PRN
  Administered 2017-05-15: 18.6 via INTRAVENOUS

## 2017-05-15 MED ORDER — IOPAMIDOL (ISOVUE-300) INJECTION 61%
INTRAVENOUS | Status: AC
  Filled 2017-05-15: qty 100

## 2017-05-15 MED ORDER — IOPAMIDOL (ISOVUE-300) INJECTION 61%
30.0000 mL | Freq: Once | INTRAVENOUS | Status: AC | PRN
  Administered 2017-05-15: 30 mL via ORAL

## 2017-05-15 MED ORDER — IOPAMIDOL (ISOVUE-300) INJECTION 61%
INTRAVENOUS | Status: AC
  Filled 2017-05-15: qty 30

## 2017-05-15 NOTE — Progress Notes (Addendum)
Briana Price  [phone] [phone]  Clinic follow up Note   Patient Care Team: [name], MD as PCP - General (Family Medicine) [name], MD as Consulting Physician (Hematology) [name], MD as Consulting Physician (General Surgery) [name], MD as Consulting Physician (Radiation Oncology) 05/19/2017  CHIEF COMPLAINTS:  Malignant neoplasm of lower-outer quadrant of left breast of female, triple negative   Oncology History   Cancer Staging Malignant neoplasm of lower-outer quadrant of left breast of female, estrogen receptor negative ([place]) Staging form: Breast, AJCC 8th Edition - Clinical: Stage IIB (cT2, cN0(f), cM0, G3, ER: Negative, PR: Negative, HER2: Negative) - Unsigned       Malignant neoplasm of lower-outer quadrant of left breast of female, estrogen receptor negative ([place])   04/27/2017 Mammogram    IMPRESSION: 1. Highly suspicious mass within the LEFT breast at the 5:30 o'clock axis, 4 cm from the nipple, measuring 2.5 cm, corresponding to the mammographic finding and corresponding to 1 of the palpable lumps in the left breast. Ultrasound-guided biopsy is recommended. 2. Mildly prominent lymph node in the LEFT axilla, with normal cortical thickness but with questionable effacement of the fatty hilum. Ultrasound-guided core biopsy is recommended. 3. No evidence of malignancy within the RIGHT breast. Also, no evidence of malignancy within the second area of clinical concern in the upper outer left breast.      04/29/2017 Initial Diagnosis    Malignant neoplasm of lower-outer quadrant of left breast of female, estrogen receptor negative ([place])      04/29/2017 Receptors her2    Estrogen Receptor: 0%, NEGATIVE Progesterone Receptor: 0%, NEGATIVE Proliferation Marker Ki67: 90% HER2 NEGATIVE       04/29/2017 Initial Biopsy     Diagnosis 1. 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 - SEE COMMENT. 2. Lymph node, needle/core biopsy, left axilla - THERE IN NO EVIDENCE OF CARCINOMA IN 1 OF 1 LYMPH NODE (0/1).       Procedure    Port placement by Dr. [name] on 9/11/8      05/14/2017 Imaging    MRI of Breast Bilateral 05/14/17  IMPRESSION: 1. 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 2. Non mass enhancement extending approximately 3.2 cm anterior to the mass in the lower inner quadrant of the left breast, suspicious for DCIS. There is no correlate on recent mammography. The overall extent of the mass and non mass enhancement is approximately 5 cm. 3. No MRI evidence of malignancy involving the right breast. 4. No pathologic lymphadenopathy. Upper normal sized left axillary lymph nodes, the largest of which was previously biopsied no evidence of metastatic disease.       Chemotherapy     PENDING neoadjuvant chemotherpy AC every 2 weeks for 4 cycles, followed by paclitaxel every 2 weeks for 4 cycles starting 05/22/17        05/15/2017 Imaging    Bone scan whole body 05/15/17 IMPRESSION: No findings specific for metastatic disease to bone.      05/15/2017 Imaging    CT CAP W contrast 05/15/17  IMPRESSION: 1. 18 mm soft tissue lesion inferior left breast compatible with known primary malignancy. 2. Mildly enlarged left axillary lymph nodes in this patient with biopsy-proven metastatic disease to the left axilla. 3. No evidence for lymphadenopathy elsewhere in the chest. No lymphadenopathy in the abdomen or  pelvis. No evidence for metastatic disease in the abdomen or pelvis. 4. Tiny right perifissural lung nodule most likely subpleural lymph node. Attention on follow-up recommended. 5. 7 mm hypoattenuating lesion in the spleen, likely a cyst or pseudocyst. Attention on follow-up recommended. 6. Uterine  fibroids. 7.  Aortic Atherosclerois (ICD10-170.0)      HISTORY OF PRESENTING ILLNESS: 8/29/8  Briana Price 51 y.o. 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  She presents to the clinic today with her mother and father.  She first noticed her a mass in her left breast on the 17th of August, It was a hard knot, no pain or tenderness. Her mammogram form 03/24/17 and other previous mammograms were all normal.   In the past she was diagnosed with HTN and takes hydrochlorothiazide and lisinopril. She had her gallbladder removed. Her maternal grandmother, maternal aunt and paternal aunt had breast cancer. 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 The pain can last up to 3 days.   Today she denies skin change around breast, pain, fatigue or weight loss. She still has regular periods that last 3 days. She work in [employer] for the [employer].   GYN HISTORY  Menarchal: 14 LMP: 05/01/17 Contraceptive: No HRT: n/a GP: G1P1 at age 43, she has a son  CURRENT THERAPY: PENDING neoadjuvant chemotherpy AC every 2 weeks for 4 cycles, followed by weekly paclitaxel with or without carboplatin for 12 cycles starting 05/22/17  INTERVAL HISTORY:  Briana Price is here for a follow up. She presents to the clinic today with her family. She feels well without any changes since last visit. She had a breast MRI which confirms biopsy-proven malignancy. There was a suspicious area in the left lower inner quadrant that needs to be biopsied. Patient is unaware of this finding. She agrees to proceed with another biopsy. MRI also noted 2 enlarged lymph nodes. One was previously biopsied and was negative and we don't need further biopsy of this. CT scan reveals no other suspicious findings outside the breast. We discussed her ECHO demonstrates normal size and function of her heart. She is informed this is a prerequisite for adriamycin chemotherapy. Her bone scan is negative. Incidental  finding of uterine fibroid; patient does not menstruate but occasionally passes clots. PAC placement today at noon by Dr. [name]. She attended chemo class and has questions about her regimen, specifically risk of secondary malignancies. She got a second opinion at [place] who told her carboplatin may work better.     MEDICAL HISTORY:  Past Medical History:  Diagnosis Date  . Anemia   . Cancer [facility])    left breast cancer  . GERD (gastroesophageal reflux disease)   . Headache    Migraines  . Hypertension    SURGICAL HISTORY: Past Surgical History:  Procedure Laterality Date  . BREAST SURGERY     biopsy  . CESAREAN SECTION    . CHOLECYSTECTOMY N/A 02/07/2013   Procedure: LAPAROSCOPIC CHOLECYSTECTOMY WITH INTRAOPERATIVE CHOLANGIOGRAM;  Surgeon: Gwenyth Ober, MD;  Location: [facility];  Service: General;  Laterality: N/A;  . COLONOSCOPY W/ BIOPSIES AND POLYPECTOMY    . FOOT SURGERY  2003   lt foot bunionectomy  . MULTIPLE TOOTH EXTRACTIONS     SOCIAL HISTORY: Social History   Social History  . Marital status: Single    Spouse name: N/A  . Number of children: N/A  . Years of education: N/A   Occupational History  .  Not on file.   Social History Main Topics  . Smoking status: Former Smoker    Types: Cigarettes  . Smokeless tobacco: Never Used     Comment: 15-20 years ago  . Alcohol use No  . Drug use: No  . Sexual activity: Not on file   Other Topics Concern  . Not on file   Social History Narrative   Tobacco use   Cigarettes: Never smoked    Tobacco history last updated 01/23/2014   No smoking   No tobacco exposure   No alcohol   Caffeine : yes soda   No recreational drug use   Occupation :employed ,[employer] 01/25/2013   Martial status : single    Children: Louie Casa 01/25/2013   72 year old son   FAMILY HISTORY: Family History  Problem Relation Age of Onset  . Breast cancer Maternal Grandmother   . Breast cancer Paternal Aunt   . Breast  cancer Maternal Aunt   . Hypertension Mother   . Breast cancer Other    ALLERGIES:  has No Known Allergies.  MEDICATIONS:  No current facility-administered medications for this visit.    Current Outpatient Prescriptions  Medication Sig Dispense Refill  . oxyCODONE (OXY IR/ROXICODONE) 5 MG immediate release tablet Take 1-2 tablets (5-10 mg total) by mouth every 6 (six) hours as needed for moderate pain, severe pain or breakthrough pain. 20 tablet 0   Facility-Administered Medications Ordered in Other Visits  Medication Dose Route Frequency Provider Last Rate Last Dose  . Chlorhexidine Gluconate Cloth 2 % PADS 6 each  6 each Topical Once [name], MD       And  . Chlorhexidine Gluconate Cloth 2 % PADS 6 each  6 each Topical Once [name], MD      . fentaNYL (SUBLIMAZE) injection   Intravenous Anesthesia Intra-op Harden Mo, CRNA   25 mcg at 05/19/17 1408  . lactated ringers infusion   Intravenous Continuous [name], MD 50 mL/hr at 05/19/17 1158    . lactated ringers infusion   Intravenous Continuous PRN Garrison Columbus T, CRNA      . lidocaine 2% (20 mg/mL) 5 mL syringe   Intravenous Anesthesia Intra-op Harden Mo, CRNA   80 mg at 05/19/17 1351  . midazolam (VERSED) injection   Intravenous Anesthesia Intra-op Harden Mo, CRNA   2 mg at 05/19/17 1340  . ondansetron (ZOFRAN) injection   Intravenous Anesthesia Intra-op Harden Mo, CRNA   4 mg at 05/19/17 1359  . propofol (DIPRIVAN) 10 mg/mL bolus/IV push   Intravenous Anesthesia Intra-op Harden Mo, CRNA   160 mg at 05/19/17 1351   REVIEW OF SYSTEMS:   Constitutional: Denies fevers, chills or abnormal night sweats Eyes: Denies blurriness of vision, double vision or watery eyes Ears, nose, mouth, throat, and face: Denies mucositis or sore throat Respiratory: Denies cough, dyspnea or wheezes Cardiovascular: Denies palpitation, chest discomfort or lower extremity swelling Gastrointestinal:  Denies nausea, heartburn  or change in bowel habits Skin: Denies abnormal skin rashes Lymphatics: Denies new lymphadenopathy or easy bruising Neurological:Denies numbness, tingling or new weaknesses Behavioral/Psych: Mood is stable, no new changes. Breast: (+) Palpable lump in right breast All other systems were reviewed with the patient and are negative.  PHYSICAL EXAMINATION: ECOG PERFORMANCE STATUS: 0 - Asymptomatic BP (!) 152/96 (BP Location: Left Arm, Patient Position: Sitting)   Pulse 82   Temp 98.6 F (37 C) (Oral)   Resp 20  Wt 214 lb 11.2 oz (97.4 kg)   LMP 05/02/2017   SpO2 100%   BMI 33.63 kg/m  GENERAL:alert, no distress and comfortable SKIN: skin color, texture, turgor are normal, no rashes or significant lesions EYES: normal, conjunctiva are pink and non-injected, sclera clear OROPHARYNX:no exudate, no erythema and lips, buccal mucosa, and tongue normal  NECK: supple, thyroid normal size, non-tender, without nodularity LYMPH:  no palpable lymphadenopathy in the cervical, axillary or inguinal LUNGS: clear to auscultation and percussion with normal breathing effort HEART: regular rate & rhythm and no murmurs and no lower extremity edema ABDOMEN:abdomen soft, non-tender and normal bowel sounds Musculoskeletal:no cyanosis of digits and no clubbing  PSYCH: alert & oriented x 3 with fluent speech NEURO: no focal motor/sensory deficits Breast: (+) no palpable mass in left axilla, (+) palpable mass in 7:00 position Inner lower quadrant 2x3 cm with skin ecchymosis.  (+) no palpable mass in right breast or right axilla, normal   LABORATORY DATA:  I have reviewed the data as listed CBC Latest Ref Rng & Units 05/06/2017 02/07/2013  WBC 3.9 - 10.3 10e3/uL 8.4 -  Hemoglobin 11.6 - 15.9 g/dL 11.9 12.6  Hematocrit 34.8 - 46.6 % 36.2 -  Platelets 145 - 400 10e3/uL 435(H) -   CMP Latest Ref Rng & Units 05/06/2017 02/04/2013  Glucose 70 - 140 mg/dl 110 92  BUN 7.0 - 26.0 mg/dL 7.2 12  Creatinine 0.6 - 1.1  mg/dL 0.7 0.66  Sodium 136 - 145 mEq/L 140 136  Potassium 3.5 - 5.1 mEq/L 4.2 3.8  Chloride 96 - 112 mEq/L - 100  CO2 22 - 29 mEq/L 30(H) 26  Calcium 8.4 - 10.4 mg/dL 10.0 9.3  Total Protein 6.4 - 8.3 g/dL 8.1 -  Total Bilirubin 0.20 - 1.20 mg/dL 0.61 -  Alkaline Phos 40 - 150 U/L 70 -  AST 5 - 34 U/L 21 -  ALT 0 - 55 U/L 20 -   PATHOLOGY  Diagnosis 1. Breast, left, needle core biopsy, 5:30 o'clock, mass - INVASIVE DUCTAL CARCINOMA. - DUCTAL CARCINOMA IN SITU. - SEE COMMENT. 2. Lymph node, needle/core biopsy, left axilla - THERE IN NO EVIDENCE OF CARCINOMA IN 1 OF 1 LYMPH NODE (0/1). Microscopic Comment 1. The carcinoma appears grade 2-3. A breast prognostic profile will be performed and the results reported separately. The results were called to [facility] on 04/30/17. (JBK:gt, 04/30/17) 1. PROGNOSTIC INDICATORS Results: IMMUNOHISTOCHEMICAL AND MORPHOMETRIC ANALYSIS PERFORMED MANUALLY Estrogen Receptor: 0%, NEGATIVE Progesterone Receptor: 0%, NEGATIVE Proliferation Marker Ki67: 90% COMMENT: The negative hormone receptor study(ies) in this case has an internal positive control. 1. 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 Systolic function was normal. The estimated ejection fraction was in the range of 55% to 60%. Wall motion was normal; there were no regional wall motion abnormalities. Left ventricular diastolic function parameters were normal.  RADIOGRAPHIC STUDIES: I have personally reviewed the radiological images as listed and agreed with the findings in the report.  Bone scan whole body 05/15/17 IMPRESSION: No findings specific for metastatic disease to bone.  CT CAP W contrast 05/15/17  IMPRESSION: 1. 18 mm soft tissue lesion inferior left breast compatible  with known primary malignancy. 2. Mildly enlarged left axillary lymph nodes in this patient with biopsy-proven metastatic disease to the left axilla. 3. No evidence  for lymphadenopathy elsewhere in the chest. No lymphadenopathy in the abdomen or pelvis. No evidence for metastatic disease in the abdomen or pelvis. 4. Tiny right perifissural lung nodule most likely subpleural lymph node. Attention on follow-up recommended. 5. 7 mm hypoattenuating lesion in the spleen, likely a cyst or pseudocyst. Attention on follow-up recommended. 6. Uterine fibroids. 7.  Aortic Atherosclerois (ICD10-170.0)  Ct Chest W Contrast  Result Date: 05/15/2017 CLINICAL DATA:  Breast cancer EXAM: CT CHEST, ABDOMEN, AND PELVIS WITH CONTRAST TECHNIQUE: Multidetector CT imaging of the chest, abdomen and pelvis was performed following the standard protocol during bolus administration of intravenous contrast. CONTRAST:  17m ISOVUE-300 IOPAMIDOL (ISOVUE-300) INJECTION 61% COMPARISON:  Abdomen and pelvis CT 06/25/2016 FINDINGS: CT CHEST FINDINGS Cardiovascular: Heart is enlarged. No pericardial effusion. No thoracic aortic aneurysm. Mediastinum/Nodes: No mediastinal lymphadenopathy. There is no hilar lymphadenopathy. 12 mm short axis left axillary lymph node is identified on image 19 series 2. Another 10 mm short axis left axillary lymph node is seen on image 18. While the these are only minimally increased in size, there are asymmetric when comparing to the right. Lungs/Pleura: Tiny 1-2 mm perifissural nodule right lung identified image 56 series 6. Lungs otherwise clear. Musculoskeletal: Bone windows reveal no worrisome lytic or sclerotic osseous lesions. 18 mm soft tissue lesion identified inferior left breast with apparent localization clip, compatible with primary lesion. CT ABDOMEN PELVIS FINDINGS Hepatobiliary: No focal abnormality within the liver parenchyma. Gallbladder surgically absent. No intrahepatic or extrahepatic  biliary dilation. Pancreas: No focal mass lesion. No dilatation of the main duct. No intraparenchymal cyst. No peripancreatic edema. Spleen: No splenomegaly. 7 mm hypoattenuating lesion identified in the subcapsular spleen (image 50 series 2), not seen on prior study. Adrenals/Urinary Tract: No adrenal nodule or mass. Kidneys unremarkable. No evidence for hydroureter. The urinary bladder appears normal for the degree of distention. Stomach/Bowel: Tiny hiatal hernia. Stomach otherwise unremarkable. Duodenum is normally positioned as is the ligament of Treitz. No small bowel wall thickening. No small bowel dilatation. The terminal ileum is normal. The appendix is normal. Diverticuli are seen scattered along the entire length of the colon without CT findings of diverticulitis. Vascular/Lymphatic: There is abdominal aortic atherosclerosis without aneurysm. Portal vein and superior mesenteric vein are patent. Duplication of IVC evident. There is no gastrohepatic or hepatoduodenal ligament lymphadenopathy. No intraperitoneal or retroperitoneal lymphadenopathy. Small lymph nodes are seen along each pelvic sidewall, similar to prior, without lymphadenopathy. Reproductive: Fibroid changes noted in the uterus. There is no adnexal mass. Other: No intraperitoneal free fluid. Musculoskeletal: Bone windows reveal no worrisome lytic or sclerotic osseous lesions. Bone windows reveal no worrisome lytic or sclerotic osseous lesions. IMPRESSION: 1. 18 mm soft tissue lesion inferior left breast compatible with known primary malignancy. 2. Mildly enlarged left axillary lymph nodes in this patient with biopsy-proven metastatic disease to the left axilla. 3. No evidence for lymphadenopathy elsewhere in the chest. No lymphadenopathy in the abdomen or pelvis. No evidence for metastatic disease in the abdomen or pelvis. 4. Tiny right perifissural lung nodule most likely subpleural lymph node. Attention on follow-up recommended. 5. 7 mm  hypoattenuating lesion in the spleen, likely a cyst or pseudocyst. Attention on follow-up recommended. 6. Uterine fibroids. 7.  Aortic Atherosclerois (ICD10-170.0) Electronically Signed   By: EMisty StanleyM.D.   On: 05/15/2017 15:43   Nm Bone Scan Whole Body  Result Date: 05/15/2017 CLINICAL DATA:  51year old female with left breast cancer. Staging. EXAM: NUCLEAR MEDICINE WHOLE BODY BONE SCAN TECHNIQUE: Whole body  anterior and posterior images were obtained approximately 3 hours after intravenous injection of radiopharmaceutical. RADIOPHARMACEUTICALS:  18.6 mCi Technetium-26mMDP IV COMPARISON:  CT chest, Abdomen and Pelvis today reported separately. FINDINGS: Expected radiotracer activity in both kidneys in the urinary bladder. Minimal perineum contamination. Homogeneous radiotracer activity throughout the axial skeleton including the skull. Homogeneous radiotracer activity throughout the visible upper extremities. Homogeneous radiotracer activity throughout the lower extremities except for degenerative appearing increased activity along the medial compartment of the left knee joint. IMPRESSION: No findings specific for metastatic disease to bone. Electronically Signed   By: HGenevie AnnM.D.   On: 05/15/2017 14:35   Ct Abdomen Pelvis W Contrast  Result Date: 05/15/2017 CLINICAL DATA:  Breast cancer EXAM: CT CHEST, ABDOMEN, AND PELVIS WITH CONTRAST TECHNIQUE: Multidetector CT imaging of the chest, abdomen and pelvis was performed following the standard protocol during bolus administration of intravenous contrast. CONTRAST:  1065mISOVUE-300 IOPAMIDOL (ISOVUE-300) INJECTION 61% COMPARISON:  Abdomen and pelvis CT 06/25/2016 FINDINGS: CT CHEST FINDINGS Cardiovascular: Heart is enlarged. No pericardial effusion. No thoracic aortic aneurysm. Mediastinum/Nodes: No mediastinal lymphadenopathy. There is no hilar lymphadenopathy. 12 mm short axis left axillary lymph node is identified on image 19 series 2. Another 10 mm  short axis left axillary lymph node is seen on image 18. While the these are only minimally increased in size, there are asymmetric when comparing to the right. Lungs/Pleura: Tiny 1-2 mm perifissural nodule right lung identified image 56 series 6. Lungs otherwise clear. Musculoskeletal: Bone windows reveal no worrisome lytic or sclerotic osseous lesions. 18 mm soft tissue lesion identified inferior left breast with apparent localization clip, compatible with primary lesion. CT ABDOMEN PELVIS FINDINGS Hepatobiliary: No focal abnormality within the liver parenchyma. Gallbladder surgically absent. No intrahepatic or extrahepatic biliary dilation. Pancreas: No focal mass lesion. No dilatation of the main duct. No intraparenchymal cyst. No peripancreatic edema. Spleen: No splenomegaly. 7 mm hypoattenuating lesion identified in the subcapsular spleen (image 50 series 2), not seen on prior study. Adrenals/Urinary Tract: No adrenal nodule or mass. Kidneys unremarkable. No evidence for hydroureter. The urinary bladder appears normal for the degree of distention. Stomach/Bowel: Tiny hiatal hernia. Stomach otherwise unremarkable. Duodenum is normally positioned as is the ligament of Treitz. No small bowel wall thickening. No small bowel dilatation. The terminal ileum is normal. The appendix is normal. Diverticuli are seen scattered along the entire length of the colon without CT findings of diverticulitis. Vascular/Lymphatic: There is abdominal aortic atherosclerosis without aneurysm. Portal vein and superior mesenteric vein are patent. Duplication of IVC evident. There is no gastrohepatic or hepatoduodenal ligament lymphadenopathy. No intraperitoneal or retroperitoneal lymphadenopathy. Small lymph nodes are seen along each pelvic sidewall, similar to prior, without lymphadenopathy. Reproductive: Fibroid changes noted in the uterus. There is no adnexal mass. Other: No intraperitoneal free fluid. Musculoskeletal: Bone windows  reveal no worrisome lytic or sclerotic osseous lesions. Bone windows reveal no worrisome lytic or sclerotic osseous lesions. IMPRESSION: 1. 18 mm soft tissue lesion inferior left breast compatible with known primary malignancy. 2. Mildly enlarged left axillary lymph nodes in this patient with biopsy-proven metastatic disease to the left axilla. 3. No evidence for lymphadenopathy elsewhere in the chest. No lymphadenopathy in the abdomen or pelvis. No evidence for metastatic disease in the abdomen or pelvis. 4. Tiny right perifissural lung nodule most likely subpleural lymph node. Attention on follow-up recommended. 5. 7 mm hypoattenuating lesion in the spleen, likely a cyst or pseudocyst. Attention on follow-up recommended. 6. Uterine fibroids. 7.  Aortic  Atherosclerois (ICD10-170.0) Electronically Signed   By: Misty Stanley M.D.   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 Left axillary lymph node biopsy revealed no evidence of metastatic disease. Evaluation prior to neoadjuvant chemotherapy. LABS:  Not applicable. EXAM: BILATERAL BREAST MRI WITH AND WITHOUT CONTRAST TECHNIQUE: Multiplanar, multisequence MR images of both breasts were obtained prior to and following the intravenous administration of 20 ml of MultiHance. THREE-DIMENSIONAL MR IMAGE RENDERING ON INDEPENDENT WORKSTATION: Three-dimensional MR images were rendered by post-processing of the original MR data on an independent workstation. The three-dimensional MR images were interpreted, and findings are reported in the following complete MRI report for this study. Three dimensional images were evaluated at the independent DynaCad workstation. COMPARISON:  No prior MRI. Mammography 04/29/2017, 04/27/2017 and earlier. Right breast ultrasound 04/29/2017, 04/27/2017.  FINDINGS: Breast composition: c. Heterogeneous fibroglandular tissue. Background parenchymal enhancement: Moderate. Right breast: No mass or abnormal enhancement. Left breast: Enhancing mass involving the lower outer left breast at posterior depth measuring approximately 2.5 (AP) X 2.1 (transverse) X 2.1 (craniocaudal) cm, demonstrating washout kinetics, biopsy-proven invasive ductal carcinoma, within which there is blooming artifact from the clip placed at the time of biopsy. Non mass enhancement extending from the mass anteriorly into the lower inner quadrant, measuring approximately 3.2 x 1.7 x 0.9 cm, demonstrating plateau kinetics. There is no correlate on scratch the recent mammography. In total, the mass and the contiguous non mass enhancement span approximately 5 cm. No abnormal enhancement elsewhere in the left breast. Lymph nodes: Upper normal sized left axillary lymph nodes, the more anterior of which measures approximately 1.9 cm and the more posterior of which measures approximately 1.5 cm. The more anterior lymph node was previous biopsied, demonstrating no evidence of metastatic disease. Ancillary findings:  None. IMPRESSION: 1. 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 2. Non mass enhancement extending approximately 3.2 cm anterior to the mass in the lower inner quadrant of the left breast, suspicious for DCIS. There is no correlate on recent mammography. The overall extent of the mass and non mass enhancement is approximately 5 cm. 3. No MRI evidence of malignancy involving the right breast. 4. No pathologic lymphadenopathy. Upper normal sized left axillary lymph nodes, the largest of which was previously biopsied no evidence of metastatic disease. RECOMMENDATION: MRI guided biopsy of the anterior extent of the non mass enhancement in order to confirm the overall extent of disease involving  the lower outer and lower inner quadrants of the left breast. BI-RADS CATEGORY  4: Suspicious. Electronically Signed   By: Evangeline Dakin M.D.   On: 05/14/2017 16:32   US Breast Ltd Uni Left Inc Axilla  Result Date: 04/27/2017 CLINICAL DATA:  Patient describes palpable masses within each breast (1 on the right and 2 on the left). EXAM: 2D DIGITAL DIAGNOSTIC BILATERAL MAMMOGRAM WITH CAD AND ADJUNCT TOMO ULTRASOUND BILATERAL BREAST COMPARISON:  Previous exam(s). ACR Breast Density Category c: The breast tissue is heterogeneously dense, which may obscure small masses. FINDINGS: There is an irregular mass within the inferior left breast, at posterior depth, measuring approximately 2 cm greatest dimension. There are no additional masses, suspicious calcifications or secondary signs of malignancy within either breast. Specifically, there are no mammographic abnormalities within the outer breasts bilaterally  corresponding to the additional areas of clinical concern. Mammographic images were processed with CAD. LEFT breast: Targeted ultrasound is performed, showing an irregular hypoechoic mass in the left breast at the 5:30 o'clock axis, 4 cm from the nipple, measuring 2.5 x 1.6 x 2 cm, corresponding to the mammographic finding and corresponding to 1 of the sites of clinical concern. Targeted ultrasound also performed of the outer left breast corresponding to the additional area of clinical concern, showing only normal fibroglandular tissues and fat lobules throughout. RIGHT breast: Targeted ultrasound is performed, evaluating the outer right breast corresponding to the area of clinical concern, showing only normal fibroglandular tissues and fat lobules. No suspicious solid or cystic mass is identified. LEFT axilla was evaluated with ultrasound showing a single mildly prominent lymph node, measuring 1.3 cm greatest dimension, with normal cortical thickness of 3.5 mm but with questionable effacement of the fatty hilum.  IMPRESSION: 1. Highly suspicious mass within the LEFT breast at the 5:30 o'clock axis, 4 cm from the nipple, measuring 2.5 cm, corresponding to the mammographic finding and corresponding to 1 of the palpable lumps in the left breast. Ultrasound-guided biopsy is recommended. 2. Mildly prominent lymph node in the LEFT axilla, with normal cortical thickness but with questionable effacement of the fatty hilum. Ultrasound-guided core biopsy is recommended. 3. No evidence of malignancy within the RIGHT breast. Also, no evidence of malignancy within the second area of clinical concern in the upper outer left breast. RECOMMENDATION: 1. Ultrasound-guided biopsy of the left breast mass at the 5:30 o'clock axis. 2. Ultrasound-guided biopsy of the single mildly prominent lymph node in the left axilla. Ultrasound-guided biopsies are scheduled for August 22nd. I have discussed the findings and recommendations with the patient. Results were also provided in writing at the conclusion of the visit. If applicable, a reminder letter will be sent to the patient regarding the next appointment. BI-RADS CATEGORY  5: Highly suggestive of malignancy. Electronically Signed   By: Franki Cabot M.D.   On: 04/27/2017 16:40   US Breast Ltd Uni Right Inc Axilla  Result Date: 04/27/2017 CLINICAL DATA:  Patient describes palpable masses within each breast (1 on the right and 2 on the left). EXAM: 2D DIGITAL DIAGNOSTIC BILATERAL MAMMOGRAM WITH CAD AND ADJUNCT TOMO ULTRASOUND BILATERAL BREAST COMPARISON:  Previous exam(s). ACR Breast Density Category c: The breast tissue is heterogeneously dense, which may obscure small masses. FINDINGS: There is an irregular mass within the inferior left breast, at posterior depth, measuring approximately 2 cm greatest dimension. There are no additional masses, suspicious calcifications or secondary signs of malignancy within either breast. Specifically, there are no mammographic abnormalities within the outer  breasts bilaterally corresponding to the additional areas of clinical concern. Mammographic images were processed with CAD. LEFT breast: Targeted ultrasound is performed, showing an irregular hypoechoic mass in the left breast at the 5:30 o'clock axis, 4 cm from the nipple, measuring 2.5 x 1.6 x 2 cm, corresponding to the mammographic finding and corresponding to 1 of the sites of clinical concern. Targeted ultrasound also performed of the outer left breast corresponding to the additional area of clinical concern, showing only normal fibroglandular tissues and fat lobules throughout. RIGHT breast: Targeted ultrasound is performed, evaluating the outer right breast corresponding to the area of clinical concern, showing only normal fibroglandular tissues and fat lobules. No suspicious solid or cystic mass is identified. LEFT axilla was evaluated with ultrasound showing a single mildly prominent lymph node, measuring 1.3 cm greatest dimension, with normal cortical  thickness of 3.5 mm but with questionable effacement of the fatty hilum. IMPRESSION: 1. Highly suspicious mass within the LEFT breast at the 5:30 o'clock axis, 4 cm from the nipple, measuring 2.5 cm, corresponding to the mammographic finding and corresponding to 1 of the palpable lumps in the left breast. Ultrasound-guided biopsy is recommended. 2. Mildly prominent lymph node in the LEFT axilla, with normal cortical thickness but with questionable effacement of the fatty hilum. Ultrasound-guided core biopsy is recommended. 3. No evidence of malignancy within the RIGHT breast. Also, no evidence of malignancy within the second area of clinical concern in the upper outer left breast. RECOMMENDATION: 1. Ultrasound-guided biopsy of the left breast mass at the 5:30 o'clock axis. 2. Ultrasound-guided biopsy of the single mildly prominent lymph node in the left axilla. Ultrasound-guided biopsies are scheduled for August 22nd. I have discussed the findings and  recommendations with the patient. Results were also provided in writing at the conclusion of the visit. If applicable, a reminder letter will be sent to the patient regarding the next appointment. BI-RADS CATEGORY  5: Highly suggestive of malignancy. Electronically Signed   By: Franki Cabot M.D.   On: 04/27/2017 16:40   Mm Diag Breast Tomo Bilateral  Result Date: 04/27/2017 CLINICAL DATA:  Patient describes palpable masses within each breast (1 on the right and 2 on the left). EXAM: 2D DIGITAL DIAGNOSTIC BILATERAL MAMMOGRAM WITH CAD AND ADJUNCT TOMO ULTRASOUND BILATERAL BREAST COMPARISON:  Previous exam(s). ACR Breast Density Category c: The breast tissue is heterogeneously dense, which may obscure small masses. FINDINGS: There is an irregular mass within the inferior left breast, at posterior depth, measuring approximately 2 cm greatest dimension. There are no additional masses, suspicious calcifications or secondary signs of malignancy within either breast. Specifically, there are no mammographic abnormalities within the outer breasts bilaterally corresponding to the additional areas of clinical concern. Mammographic images were processed with CAD. LEFT breast: Targeted ultrasound is performed, showing an irregular hypoechoic mass in the left breast at the 5:30 o'clock axis, 4 cm from the nipple, measuring 2.5 x 1.6 x 2 cm, corresponding to the mammographic finding and corresponding to 1 of the sites of clinical concern. Targeted ultrasound also performed of the outer left breast corresponding to the additional area of clinical concern, showing only normal fibroglandular tissues and fat lobules throughout. RIGHT breast: Targeted ultrasound is performed, evaluating the outer right breast corresponding to the area of clinical concern, showing only normal fibroglandular tissues and fat lobules. No suspicious solid or cystic mass is identified. LEFT axilla was evaluated with ultrasound showing a single mildly  prominent lymph node, measuring 1.3 cm greatest dimension, with normal cortical thickness of 3.5 mm but with questionable effacement of the fatty hilum. IMPRESSION: 1. Highly suspicious mass within the LEFT breast at the 5:30 o'clock axis, 4 cm from the nipple, measuring 2.5 cm, corresponding to the mammographic finding and corresponding to 1 of the palpable lumps in the left breast. Ultrasound-guided biopsy is recommended. 2. Mildly prominent lymph node in the LEFT axilla, with normal cortical thickness but with questionable effacement of the fatty hilum. Ultrasound-guided core biopsy is recommended. 3. No evidence of malignancy within the RIGHT breast. Also, no evidence of malignancy within the second area of clinical concern in the upper outer left breast. RECOMMENDATION: 1. Ultrasound-guided biopsy of the left breast mass at the 5:30 o'clock axis. 2. Ultrasound-guided biopsy of the single mildly prominent lymph node in the left axilla. Ultrasound-guided biopsies are scheduled for August 22nd. I  have discussed the findings and recommendations with the patient. Results were also provided in writing at the conclusion of the visit. If applicable, a reminder letter will be sent to the patient regarding the next appointment. BI-RADS CATEGORY  5: Highly suggestive of malignancy. Electronically Signed   By: Franki Cabot M.D.   On: 04/27/2017 16:40   [place] Axillary Node Core Biopsy Left  Addendum Date: 05/01/2017   ADDENDUM REPORT: 04/30/2017 14:44 ADDENDUM: Pathology revealed GRADE II-III INVASIVE DUCTAL CARCINOMA, DUCTAL CARCINOMA IN SITU of the Left breast, 5:30 o'clock. THERE IN NO EVIDENCE OF CARCINOMA of the Left axillary lymph node. This was found to be concordant by Dr. Lajean Manes. Pathology results were discussed with the patient by telephone. The patient reported doing well after the biopsies with tenderness at the sites. Post biopsy instructions and care were reviewed and questions were answered. The  patient was encouraged to call [facility] for any additional concerns. The patient was referred to [facility] on May 06, 2017. Pathology results reported by Terie Purser, RN on 04/30/2017. Electronically Signed   By: Lajean Manes M.D.   On: 04/30/2017 14:44   Result Date: 05/01/2017 CLINICAL DATA:  Patient presents for ultrasound-guided core needle biopsy of a 5:30 o'clock position left breast mass and a borderline abnormal left axillary lymph node. EXAM: ULTRASOUND GUIDED LEFT BREAST CORE NEEDLE BIOPSY ULTRASOUND GUIDED LEFT AXILLA CORE NEEDLE BIOPSY COMPARISON:  Previous exam(s). FINDINGS: I met with the patient and we discussed the procedure of ultrasound-guided biopsy, including benefits and alternatives. We discussed the high likelihood of a successful procedure. We discussed the risks of the procedure, including infection, bleeding, tissue injury, clip migration, and inadequate sampling. Informed written consent was given. The usual time-out protocol was performed immediately prior to the procedure. Lesion quadrant: Lower outer quadrant Using sterile technique and 1% Lidocaine as local anesthetic, under direct ultrasound visualization, a 12 gauge spring-loaded device was used to perform biopsy of 5:30 o'clock position left breast mass using a medial approach. At the conclusion of the procedure a ribbon shaped tissue marker clip was deployed into the biopsy cavity. Using sterile technique and 1% Lidocaine as local anesthetic, under direct ultrasound visualization, a 14 gauge spring-loaded device was used to perform biopsy of the borderline abnormal left axillary lymph node using an inferior approach. At the conclusion of the procedure a HydroMARK tissue marker clip was deployed into the biopsy cavity. Follow up 2 view mammogram was performed and dictated separately. IMPRESSION: Ultrasound guided biopsy  of a left breast mass and a borderline abnormal left axillary lymph node. No apparent complications. Electronically Signed: By: Lajean Manes M.D. On: 04/29/2017 10:24   Mm Clip Placement Left  Result Date: 04/29/2017 CLINICAL DATA:  Status post ultrasound-guided core needle biopsy of a left breast mass and a borderline abnormal left axillary lymph node. EXAM: DIAGNOSTIC LEFT MAMMOGRAM POST ULTRASOUND BIOPSY COMPARISON:  Previous exam(s). FINDINGS: Mammographic images were obtained following ultrasound guided biopsy of left breast mass and a left axillary lymph node. The ribbon shaped biopsy clip lies within the center of the left breast mass. [facility] clip is seen on the MLO view either within or adjacent to the borderline abnormal lymph node. IMPRESSION: Well-positioned ribbon shaped biopsy clip within the left breast mass. HydroMARK biopsy clip lies within or adjacent to the left axillary lymph node. Final Assessment: Post Procedure Mammograms for Marker Placement Electronically Signed  By: Lajean Manes M.D.   On: 04/29/2017 10:43   [place] Lt Breast Bx W Loc Dev 1st Lesion Img Bx Spec US Guide  Addendum Date: 05/01/2017   ADDENDUM REPORT: 04/30/2017 14:44 ADDENDUM: Pathology revealed GRADE II-III INVASIVE DUCTAL CARCINOMA, DUCTAL CARCINOMA IN SITU of the Left breast, 5:30 o'clock. THERE IN NO EVIDENCE OF CARCINOMA of the Left axillary lymph node. This was found to be concordant by Dr. Lajean Manes. Pathology results were discussed with the patient by telephone. The patient reported doing well after the biopsies with tenderness at the sites. Post biopsy instructions and care were reviewed and questions were answered. The patient was encouraged to call [facility] for any additional concerns. The patient was referred to [facility] on May 06, 2017. Pathology results reported by Terie Purser, RN on  04/30/2017. Electronically Signed   By: Lajean Manes M.D.   On: 04/30/2017 14:44   Result Date: 05/01/2017 CLINICAL DATA:  Patient presents for ultrasound-guided core needle biopsy of a 5:30 o'clock position left breast mass and a borderline abnormal left axillary lymph node. EXAM: ULTRASOUND GUIDED LEFT BREAST CORE NEEDLE BIOPSY ULTRASOUND GUIDED LEFT AXILLA CORE NEEDLE BIOPSY COMPARISON:  Previous exam(s). FINDINGS: I met with the patient and we discussed the procedure of ultrasound-guided biopsy, including benefits and alternatives. We discussed the high likelihood of a successful procedure. We discussed the risks of the procedure, including infection, bleeding, tissue injury, clip migration, and inadequate sampling. Informed written consent was given. The usual time-out protocol was performed immediately prior to the procedure. Lesion quadrant: Lower outer quadrant Using sterile technique and 1% Lidocaine as local anesthetic, under direct ultrasound visualization, a 12 gauge spring-loaded device was used to perform biopsy of 5:30 o'clock position left breast mass using a medial approach. At the conclusion of the procedure a ribbon shaped tissue marker clip was deployed into the biopsy cavity. Using sterile technique and 1% Lidocaine as local anesthetic, under direct ultrasound visualization, a 14 gauge spring-loaded device was used to perform biopsy of the borderline abnormal left axillary lymph node using an inferior approach. At the conclusion of the procedure a HydroMARK tissue marker clip was deployed into the biopsy cavity. Follow up 2 view mammogram was performed and dictated separately. IMPRESSION: Ultrasound guided biopsy of a left breast mass and a borderline abnormal left axillary lymph node. No apparent complications. Electronically Signed: By: Lajean Manes M.D. On: 04/29/2017 10:24   ASSESSMENT & PLAN:  Briana Price is a 51 y.o. african-american female with a history of HTN and arthritis  in her back, presented with a palpable left breast mass.  1. 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  -We discussed her mammogram, ultrasound findings, and initial biopsy results with patient and her family members in details.  -She presented with a palpable left breast mass, measures 2.5 cm on ultrasound, with a prominent lymph node. 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 -She is a candidate for lumpectomy and sentinel lymph node biopsy. She was seen by surgeon Dr. [name] today. Due to her young age and triple negative disease, we recommended her to see genetics, to ruled out inheritable genetic syndrome. -I reviewed her breast MRI findings, which showed additional non-mass enhancement in the left breast, which is anterior to the  biopsy proven breast cancer, in the lower inner quadrant. I recommend her to have additional biopsy, she agrees, we'll schedule for this week. -I reviewed her CT scan and bone scan findings in great detail, no evidence of distant metastasis. -06 MRI and a CT scan showed enlarged left axillary lymph node, but biopsy was negative for malignancy, although I still have some doubt she may have metastatic adenopathy. -She agreed to proceed with neoadjuvant chemotherapy. She has concerns about the secondary malignancy from Adriamycin. However given the extensive of her disease, especially if the second biopsy is positive for invasive disease, I would strongly encourage her to take Adriamycin and Cytoxan, followed by Taxol with or without carboplatin. -She had a second opinion at [facility] Dr. Juanita Craver, who recommend carboplatin and taxol, I will discussed with her. Given her high risk of recurrence, I think carboplatin and Taxol May not be adequate systemic therapy. -The goal of therapy is curative -I  discussed the rationale and benefit for neoadjuvant chemo that precedes definitive surgery.  -Idiscussed adjuvant radiation will target lymph nodes and tissues removed during surgery to reduce risk of recurrence.  -I disccussed function and side effects of neulasta, which she will receive during AC, including bone pain in low back and pelvis  -I recommend claritin x5 days beginning on day of injection to reduce bone pain. -She plans to continue working. She is informed 3-4 days after chemo is most symptomatic for fatigue and decreased appetite. Usually recovery by day 5 after treatment. She may request to change chemo day if she does not feel well enough to work during th week.  She will proceed with [facility] every 2 weeks for 4 treatments then likely taxol + carbo weekly x12 weeks.  -I discussed less side effects such as nausea and pancytopenia with TC regimen -I reviewed breast cancer surveillance after she completes the above treatment.   2. Genetics -Based on her significant familial breast cancer history, her young age and triple negative disease,  I suggest she gets genetic testing for possible gene mutations.  -Apt with Roma Kayser in genetics 06/24/17  3. HTN -she is on HCTZ and lisinopril , we'll continue, she will follow-up with her primary care physician  -We discussed the impact of chemotherapy, blood pressure, and a possibility that we may hold her blood pressure medication if she gets dehydrated.   4. Rosacea -Dermatologist prescribed topical steroid -OK to use during chemotherapy; she will also receive IV steroid which may help control  PLAN:  -I will ask our breast navigator to call breast center to schedule biopsy this week -she is scheduled to start neoadjuvant chemotherapy Adriamycin and Cytoxan this Friday -I will call [facility] oncologist to discuss her case -[facility] placement today by Dr. [name] -Plan to proceed with [facility] 05/22/17 -I will see her back one week after first cycle chemo     Orders Placed This Encounter  Procedures  . MR LT BREAST BX W LOC DEV 1ST LESION IMAGE BX SPEC MR GUIDE    BCBS// PF:  9/6 GI [facility]/KEISHA  RECOMMENDATION: MRI guided biopsy of the anterior extent of the non mass enhancement in order to confirm the overall extent of disease involving the lower outer and lower inner quadrants of the left breast.    Standing Status:   Future    Standing Expiration Date:   07/19/2018    Scheduling Instructions:     Please biopsy the second area of non-mass enhancement on recent breast MRI  Order Specific Question:   Reason for Exam (SYMPTOM  OR DIAGNOSIS REQUIRED)    Answer:   rule out second breast cancer    Order Specific Question:   What is the patient's sedation requirement?    Answer:   No Sedation    Order Specific Question:   Does the patient have a pacemaker or implanted devices?    Answer:   No    Order Specific Question:   Preferred imaging location?    Answer:   [facility] (table limit-550lbs)    Order Specific Question:   Radiology Contrast Protocol - do NOT remove file path    Answer:   [email or web address]  . CBC with Differential    Standing Status:   Standing    Number of Occurrences:   50    Standing Expiration Date:   05/18/2022  . Comprehensive metabolic panel    Standing Status:   Standing    Number of Occurrences:   50    Standing Expiration Date:   05/18/2022   All questions were answered. The patient knows to call the clinic with any problems, questions or concerns. I spent 30 minutes counseling the patient face to face. The total time spent in the appointment was 40 minutes and more than 50% was on counseling.  This document serves as a record of services personally performed by [name], MD. It was created on her behalf by Joslyn Devon, a trained medical scribe. The creation of this record is based on the scribe's personal observations and the provider's statements to them. This document has  been checked and approved by the attending provider.      [name], MD 05/19/2017

## 2017-05-16 DIAGNOSIS — I10 Essential (primary) hypertension: Secondary | ICD-10-CM | POA: Insufficient documentation

## 2017-05-18 ENCOUNTER — Encounter (HOSPITAL_COMMUNITY): Payer: Self-pay | Admitting: *Deleted

## 2017-05-18 DIAGNOSIS — C50512 Malignant neoplasm of lower-outer quadrant of left female breast: Secondary | ICD-10-CM | POA: Diagnosis not present

## 2017-05-18 DIAGNOSIS — Z171 Estrogen receptor negative status [ER-]: Secondary | ICD-10-CM | POA: Diagnosis not present

## 2017-05-18 NOTE — Progress Notes (Signed)
Pt denies any acute cardiopulmonary issues. Pt denies having a cardiac cath. Pt denies having an EKG and chest x ray within the las year. Pt made aware to stop taking  Aspirin, vitamins, fish oil and herbal medications. Do not take any NSAIDs ie: Ibuprofen, Advil, Naproxen (Aleve), Motrin, Excedrin Migraine, BC and Goody Powder or any medication containing Aspirin. Pt verbalized understanding of all pre-op instructions.

## 2017-05-18 NOTE — H&P (Signed)
CHAYIL GANTT 05/06/2017 8:06 AM Location: [facility] Patient #: [id number] DOB: Oct 30, 1965 Single / Language: [name] / Race: Black or African American Female   History of Present Illness [name] MD; 05/06/2017 4:30 PM) The patient is a 51 year old female who presents with breast cancer. Pt is a 51 yo F referred by Dr. Garwin Brothers with a new diagnosis of left breast cancer. She found this several weeks ago and showed doctor. She received dx mammogram and ultrasound showing 2.5 cm irregular hypoechoic mass at 5:30 as well as a mildly prominent lymph node. She had core needle biopsies of both. 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 The lymph node was negative for cancer on biopsy.   She has no personal history of cancer. menarche was age 73. she is still having periods. She is a G1P1 wtih child at age 48. She has had a colonoscopy wtih several polyps wtih Dr. Collene Mares and is on a 5 year follow up schedule for another colonoscopy.   She has a significant family history of cancer. Her maternal grandmother and maternal aunt had breast cancer. She has two paternal aunts who had breast cancer and a paternal uncle who had leukemia.    Pathology 04/29/17 Diagnosis 1. Breast, left, needle core biopsy, 5:30 o'clock, mass - INVASIVE DUCTAL CARCINOMA. - DUCTAL CARCINOMA IN SITU. - SEE COMMENT. 2. Lymph node, needle/core biopsy, left axilla - THERE IN NO EVIDENCE OF CARCINOMA IN 1 OF 1 LYMPH NODE (0/1). Microscopic Comment 1. The carcinoma appears grade 2-3. IMMUNOHISTOCHEMICAL AND MORPHOMETRIC ANALYSIS PERFORMED MANUALLY Estrogen Receptor: 0%, NEGATIVE Progesterone Receptor: 0%, NEGATIVE Proliferation Marker Ki67: 90% Results: HER2 - NEGATIVE RATIO OF HER2/CEP17 SIGNALS 1.11 AVERAGE HER2 COPY NUMBER PER CELL 2.10  CBC remarkable for Plts 435k, CMET for CO2 30.   Dx mammogram/us 04/27/2017 ACR Breast Density Category c: The  breast tissue is heterogeneously dense, which may obscure small masses.  FINDINGS: There is an irregular mass within the inferior left breast, at posterior depth, measuring approximately 2 cm greatest dimension.  There are no additional masses, suspicious calcifications or secondary signs of malignancy within either breast. Specifically, there are no mammographic abnormalities within the outer breasts bilaterally corresponding to the additional areas of clinical concern.  Mammographic images were processed with CAD.  LEFT breast: Targeted ultrasound is performed, showing an irregular hypoechoic mass in the left breast at the 5:30 o'clock axis, 4 cm from the nipple, measuring 2.5 x 1.6 x 2 cm, corresponding to the mammographic finding and corresponding to 1 of the sites of clinical concern.  Targeted ultrasound also performed of the outer left breast corresponding to the additional area of clinical concern, showing only normal fibroglandular tissues and fat lobules throughout.  RIGHT breast: Targeted ultrasound is performed, evaluating the outer right breast corresponding to the area of clinical concern, showing only normal fibroglandular tissues and fat lobules. No suspicious solid or cystic mass is identified.  LEFT axilla was evaluated with ultrasound showing a single mildly prominent lymph node, measuring 1.3 cm greatest dimension, with normal cortical thickness of 3.5 mm but with questionable effacement of the fatty hilum.  IMPRESSION: 1. Highly suspicious mass within the LEFT breast at the 5:30 o'clock axis, 4 cm from the nipple, measuring 2.5 cm, corresponding to the mammographic finding and corresponding to 1 of the palpable lumps in the left breast. Ultrasound-guided biopsy is recommended. 2. Mildly prominent lymph node in the LEFT axilla, with normal cortical thickness  but with questionable effacement of the fatty hilum. Ultrasound-guided core biopsy is  recommended. 3. No evidence of malignancy within the RIGHT breast. Also, no evidence of malignancy within the second area of clinical concern in the upper outer left breast.  RECOMMENDATION: 1. Ultrasound-guided biopsy of the left breast mass at the 5:30 o'clock axis. 2. Ultrasound-guided biopsy of the single mildly prominent lymph node in the left axilla. Ultrasound-guided biopsies are scheduled for August 22nd.    Past Surgical History Conni Slipper, RN; 05/06/2017 8:06 AM) Cesarean Section - 1  Colon Polyp Removal - Colonoscopy  Gallbladder Surgery - Laparoscopic  Sentinel Lymph Node Biopsy   Diagnostic Studies History Conni Slipper, RN; 05/06/2017 8:06 AM) Colonoscopy  1-5 years ago Mammogram  within last year Pap Smear  1-5 years ago  Medication History Conni Slipper, RN; 05/06/2017 8:06 AM) Medications Reconciled  Social History Conni Slipper, RN; 05/06/2017 8:06 AM) Alcohol use  Occasional alcohol use. No drug use  Tobacco use  Former smoker.  Family History Conni Slipper, RN; 05/06/2017 8:06 AM) Arthritis  Mother. Breast Cancer  Family Members In General. Diabetes Mellitus  Family Members In General, Sister. Hypertension  Mother.  Pregnancy / Birth History Conni Slipper, RN; 05/06/2017 8:06 AM) Age at menarche  41 years. Gravida  1 Maternal age  39-35 Para  1 Regular periods   Other Problems Conni Slipper, RN; 05/06/2017 8:06 AM) Arthritis  Back Pain  Cholelithiasis  High blood pressure  Migraine Headache     Review of Systems Conni Slipper RN; 05/06/2017 8:06 AM) General Not Present- Appetite Loss, Chills, Fatigue, Fever, Night Sweats, Weight Gain and Weight Loss. Skin Not Present- Change in Wart/Mole, Dryness, Hives, Jaundice, New Lesions, Non-Healing Wounds, Rash and Ulcer. HEENT Not Present- Earache, Hearing Loss, Hoarseness, Nose Bleed, Oral Ulcers, Ringing in the Ears, Seasonal Allergies, Sinus Pain, Sore Throat, Visual Disturbances,  Wears glasses/contact lenses and Yellow Eyes. Respiratory Present- Snoring. Not Present- Bloody sputum, Chronic Cough, Difficulty Breathing and Wheezing. Breast Present- Breast Mass. Not Present- Breast Pain, Nipple Discharge and Skin Changes. Cardiovascular Not Present- Chest Pain, Difficulty Breathing Lying Down, Leg Cramps, Palpitations, Rapid Heart Rate, Shortness of Breath and Swelling of Extremities. Gastrointestinal Not Present- Abdominal Pain, Bloating, Bloody Stool, Change in Bowel Habits, Chronic diarrhea, Constipation, Difficulty Swallowing, Excessive gas, Gets full quickly at meals, Hemorrhoids, Indigestion, Nausea, Rectal Pain and Vomiting. Female Genitourinary Not Present- Frequency, Nocturia, Painful Urination, Pelvic Pain and Urgency. Musculoskeletal Not Present- Back Pain, Joint Pain, Joint Stiffness, Muscle Pain, Muscle Weakness and Swelling of Extremities. Neurological Not Present- Decreased Memory, Fainting, Headaches, Numbness, Seizures, Tingling, Tremor, Trouble walking and Weakness. Psychiatric Present- Frequent crying. Not Present- Anxiety, Bipolar, Change in Sleep Pattern, Depression and Fearful. Endocrine Not Present- Cold Intolerance, Excessive Hunger, Hair Changes, Heat Intolerance, Hot flashes and New Diabetes. Hematology Not Present- Blood Thinners, Easy Bruising, Excessive bleeding, Gland problems, HIV and Persistent Infections.  Vitals [name] MD; 05/06/2017 4:22 PM) 05/06/2017 4:21 PM Weight: 218.4 lb Height: 67in Body Surface Area: 2.1 m Body Mass Index: 34.21 kg/m  Temp.: 98.92F  Pulse: 99 (Regular)  Resp.: 18 (Unlabored)  P.OX: 99% (Room air) BP: 137/76 (Sitting, Left Arm, Standard)       Physical Exam [name] MD; 05/06/2017 4:31 PM) General Mental Status-Alert. General Appearance-Consistent with stated age. Hydration-Well hydrated. Voice-Normal.  Head and Neck Head-normocephalic, atraumatic with no lesions or  palpable masses. Trachea-midline. Thyroid Gland Characteristics - normal size and consistency.  Eye Eyeball - Bilateral-Extraocular movements intact. Sclera/Conjunctiva -  Bilateral-No scleral icterus.  Chest and Lung Exam Chest and lung exam reveals -quiet, even and easy respiratory effort with no use of accessory muscles and on auscultation, normal breath sounds, no adventitious sounds and normal vocal resonance. Inspection Chest Wall - Normal. Back - normal.  Breast Note: breasts are symmetric bilaterally. mild ptosis. She has a palpable mass at 6 pm on the left. This is firm. it is around 3 cm. Axilla is negative for palpable lymphadenopathy. no nipple retraction or nipple discharge, no skin dimpling. The right is negative for masses or other signs.   Cardiovascular Cardiovascular examination reveals -normal heart sounds, regular rate and rhythm with no murmurs and normal pedal pulses bilaterally.  Abdomen Inspection Inspection of the abdomen reveals - No Hernias. Palpation/Percussion Palpation and Percussion of the abdomen reveal - Soft, Non Tender, No Rebound tenderness, No Rigidity (guarding) and No hepatosplenomegaly. Auscultation Auscultation of the abdomen reveals - Bowel sounds normal.  Neurologic Neurologic evaluation reveals -alert and oriented x 3 with no impairment of recent or remote memory. Mental Status-Normal.  Musculoskeletal Global Assessment -Note: no gross deformities.  Normal Exam - Left-Upper Extremity Strength Normal and Lower Extremity Strength Normal. Normal Exam - Right-Upper Extremity Strength Normal and Lower Extremity Strength Normal.  Lymphatic Head & Neck  General Head & Neck Lymphatics: Bilateral - Description - Normal. Axillary  General Axillary Region: Bilateral - Description - Normal. Tenderness - Non Tender. Femoral & Inguinal  Generalized Femoral & Inguinal Lymphatics: Bilateral - Description - No Generalized  lymphadenopathy.    Assessment & Plan [name] MD; 05/06/2017 4:38 PM) PRIMARY CANCER OF LOWER OUTER QUADRANT OF LEFT FEMALE BREAST (C50.512) Impression: Patient is a 51 year old female with a new diagnosis of left breast cancer. This is a clinical T2 N0 triple negative cancer. We will plan for neoadjuvant chemotherapy. The advantages of neoadjuvant chemotherapy are that it gives [place] time to get genetics as well as MRI. I will plan for port placement. We had a discussion about the pros and cons of the strategy. I advised the patient that if she has a genetic defect with a high incidence of recurrent breast cancer that we would recommend bilateral mastectomies.  I also discussed that some younger patients elect to have bilateral mastectomies due to risk of recurrence based on their age or desire for symmetry. However, this tumor appears to be eligible for breast conservation if genetics are negative and she desires that approach.  I have reviewed port placement as well as the risks. I discussed timing. We also discussed recovery of port placement as well as recovery of lumpectomy versus mastectomy with reconstruction.  I advised the patient that there are many patients who have additional findings on MRI requiring additional biopsies. I discussed that sometimes additional biopsies change our planned surgery.  I will write orders for port placement. If genetics are positive or if she desires input, we will send to plastic surgery.  Final pathology and surgery choice will affect our recommendations for radiation. Current Plans Pt Education - [facility] Breast Cancer Information Given - [facility] "Breast Journey" Package Pt Education - [facility] port insertion education Referred to Genetic Counseling, for evaluation and follow up [employer]). Routine. MRI, BOTH BREASTS (16384) (new left breast cancer, already scheduled.)   Signed by [name], MD (05/06/2017 4:39 PM)

## 2017-05-19 ENCOUNTER — Ambulatory Visit (HOSPITAL_COMMUNITY)
Admission: RE | Admit: 2017-05-19 | Discharge: 2017-05-19 | Disposition: A | Discharge status: Home / Self Care | Payer: Federal, State, Local not specified - PPO | Source: Ambulatory Visit | Attending: General Surgery | Admitting: General Surgery

## 2017-05-19 ENCOUNTER — Encounter: Payer: Self-pay | Admitting: Hematology

## 2017-05-19 ENCOUNTER — Ambulatory Visit (HOSPITAL_COMMUNITY): Payer: Federal, State, Local not specified - PPO

## 2017-05-19 ENCOUNTER — Ambulatory Visit (HOSPITAL_COMMUNITY): Payer: Federal, State, Local not specified - PPO | Admitting: Anesthesiology

## 2017-05-19 ENCOUNTER — Encounter (HOSPITAL_COMMUNITY)
Admission: RE | Disposition: A | Discharge status: Home / Self Care | Payer: Self-pay | Source: Ambulatory Visit | Attending: General Surgery

## 2017-05-19 ENCOUNTER — Ambulatory Visit (HOSPITAL_BASED_OUTPATIENT_CLINIC_OR_DEPARTMENT_OTHER): Payer: Federal, State, Local not specified - PPO | Admitting: Hematology

## 2017-05-19 ENCOUNTER — Other Ambulatory Visit: Payer: Self-pay | Admitting: Hematology

## 2017-05-19 ENCOUNTER — Other Ambulatory Visit: Payer: Self-pay

## 2017-05-19 ENCOUNTER — Encounter (HOSPITAL_COMMUNITY): Payer: Self-pay | Admitting: Anesthesiology

## 2017-05-19 VITALS — BP 152/96 | HR 82 | Temp 98.6°F | Resp 20 | Wt 214.7 lb

## 2017-05-19 DIAGNOSIS — C50512 Malignant neoplasm of lower-outer quadrant of left female breast: Secondary | ICD-10-CM | POA: Diagnosis not present

## 2017-05-19 DIAGNOSIS — K219 Gastro-esophageal reflux disease without esophagitis: Secondary | ICD-10-CM | POA: Insufficient documentation

## 2017-05-19 DIAGNOSIS — I10 Essential (primary) hypertension: Secondary | ICD-10-CM

## 2017-05-19 DIAGNOSIS — Z79899 Other long term (current) drug therapy: Secondary | ICD-10-CM | POA: Diagnosis not present

## 2017-05-19 DIAGNOSIS — Z95828 Presence of other vascular implants and grafts: Secondary | ICD-10-CM

## 2017-05-19 DIAGNOSIS — Z4682 Encounter for fitting and adjustment of non-vascular catheter: Secondary | ICD-10-CM | POA: Diagnosis not present

## 2017-05-19 DIAGNOSIS — Z452 Encounter for adjustment and management of vascular access device: Secondary | ICD-10-CM | POA: Diagnosis not present

## 2017-05-19 DIAGNOSIS — Z87891 Personal history of nicotine dependence: Secondary | ICD-10-CM | POA: Diagnosis not present

## 2017-05-19 DIAGNOSIS — K8020 Calculus of gallbladder without cholecystitis without obstruction: Secondary | ICD-10-CM | POA: Diagnosis not present

## 2017-05-19 DIAGNOSIS — Z171 Estrogen receptor negative status [ER-]: Principal | ICD-10-CM

## 2017-05-19 DIAGNOSIS — Z6834 Body mass index (BMI) 34.0-34.9, adult: Secondary | ICD-10-CM | POA: Insufficient documentation

## 2017-05-19 DIAGNOSIS — D649 Anemia, unspecified: Secondary | ICD-10-CM | POA: Diagnosis not present

## 2017-05-19 DIAGNOSIS — E669 Obesity, unspecified: Secondary | ICD-10-CM | POA: Insufficient documentation

## 2017-05-19 DIAGNOSIS — Z419 Encounter for procedure for purposes other than remedying health state, unspecified: Secondary | ICD-10-CM

## 2017-05-19 DIAGNOSIS — C50212 Malignant neoplasm of upper-inner quadrant of left female breast: Secondary | ICD-10-CM | POA: Diagnosis not present

## 2017-05-19 HISTORY — DX: Headache, unspecified: R51.9

## 2017-05-19 HISTORY — DX: Malignant (primary) neoplasm, unspecified: C80.1

## 2017-05-19 HISTORY — DX: Anemia, unspecified: D64.9

## 2017-05-19 HISTORY — DX: Headache: R51

## 2017-05-19 HISTORY — PX: PORTACATH PLACEMENT: SHX2246

## 2017-05-19 LAB — HCG, SERUM, QUALITATIVE: PREG SERUM: NEGATIVE

## 2017-05-19 SURGERY — INSERTION, TUNNELED CENTRAL VENOUS DEVICE, WITH PORT
Anesthesia: General | Site: Chest | Laterality: Right

## 2017-05-19 MED ORDER — MIDAZOLAM HCL 2 MG/2ML IJ SOLN
INTRAMUSCULAR | Status: DC | PRN
  Administered 2017-05-19: 2 mg via INTRAVENOUS

## 2017-05-19 MED ORDER — LIDOCAINE 2% (20 MG/ML) 5 ML SYRINGE
INTRAMUSCULAR | Status: AC
  Filled 2017-05-19: qty 5

## 2017-05-19 MED ORDER — CEFAZOLIN SODIUM-DEXTROSE 2-4 GM/100ML-% IV SOLN
2.0000 g | INTRAVENOUS | Status: AC
  Administered 2017-05-19: 2 g via INTRAVENOUS

## 2017-05-19 MED ORDER — CEFAZOLIN SODIUM-DEXTROSE 2-4 GM/100ML-% IV SOLN
INTRAVENOUS | Status: AC
  Filled 2017-05-19: qty 100

## 2017-05-19 MED ORDER — LACTATED RINGERS IV SOLN
INTRAVENOUS | Status: DC | PRN
  Administered 2017-05-19: 12:00:00 via INTRAVENOUS

## 2017-05-19 MED ORDER — CHLORHEXIDINE GLUCONATE CLOTH 2 % EX PADS: 6.0000 | MEDICATED_PAD | Freq: Once | CUTANEOUS | Status: DC

## 2017-05-19 MED ORDER — ONDANSETRON HCL 4 MG/2ML IJ SOLN
INTRAMUSCULAR | Status: AC
  Filled 2017-05-19: qty 2

## 2017-05-19 MED ORDER — FENTANYL CITRATE (PF) 100 MCG/2ML IJ SOLN
25.0000 ug | INTRAMUSCULAR | Status: DC | PRN
  Administered 2017-05-19: 25 ug via INTRAVENOUS

## 2017-05-19 MED ORDER — HEPARIN SOD (PORK) LOCK FLUSH 100 UNIT/ML IV SOLN
INTRAVENOUS | Status: AC
  Filled 2017-05-19: qty 5

## 2017-05-19 MED ORDER — BUPIVACAINE-EPINEPHRINE (PF) 0.25% -1:200000 IJ SOLN
INTRAMUSCULAR | Status: AC
  Filled 2017-05-19: qty 30

## 2017-05-19 MED ORDER — LIDOCAINE HCL 1 % IJ SOLN
INTRAMUSCULAR | Status: DC | PRN
  Administered 2017-05-19: 10 mL via INTRAMUSCULAR

## 2017-05-19 MED ORDER — LIDOCAINE HCL (PF) 1 % IJ SOLN
INTRAMUSCULAR | Status: AC
  Filled 2017-05-19: qty 30

## 2017-05-19 MED ORDER — METOCLOPRAMIDE HCL 5 MG/ML IJ SOLN
10.0000 mg | Freq: Once | INTRAMUSCULAR | Status: DC | PRN
Start: 2017-05-19 — End: 2017-05-19

## 2017-05-19 MED ORDER — OXYCODONE HCL 5 MG PO TABS: 5.0000 mg | ORAL_TABLET | Freq: Four times a day (QID) | ORAL | 0 refills | Status: DC | PRN

## 2017-05-19 MED ORDER — LACTATED RINGERS IV SOLN
INTRAVENOUS | Status: DC
  Administered 2017-05-19: 12:00:00 via INTRAVENOUS

## 2017-05-19 MED ORDER — DEXAMETHASONE SODIUM PHOSPHATE 10 MG/ML IJ SOLN
INTRAMUSCULAR | Status: AC
  Filled 2017-05-19: qty 1

## 2017-05-19 MED ORDER — LIDOCAINE 2% (20 MG/ML) 5 ML SYRINGE
INTRAMUSCULAR | Status: DC | PRN
  Administered 2017-05-19: 80 mg via INTRAVENOUS

## 2017-05-19 MED ORDER — PROPOFOL 10 MG/ML IV BOLUS
INTRAVENOUS | Status: DC | PRN
  Administered 2017-05-19: 160 mg via INTRAVENOUS

## 2017-05-19 MED ORDER — ONDANSETRON HCL 4 MG/2ML IJ SOLN
INTRAMUSCULAR | Status: DC | PRN
  Administered 2017-05-19: 4 mg via INTRAVENOUS

## 2017-05-19 MED ORDER — MIDAZOLAM HCL 2 MG/2ML IJ SOLN
INTRAMUSCULAR | Status: AC
  Filled 2017-05-19: qty 2

## 2017-05-19 MED ORDER — FENTANYL CITRATE (PF) 100 MCG/2ML IJ SOLN
INTRAMUSCULAR | Status: AC
  Filled 2017-05-19: qty 2

## 2017-05-19 MED ORDER — 0.9 % SODIUM CHLORIDE (POUR BTL) OPTIME
TOPICAL | Status: DC | PRN
  Administered 2017-05-19: 1000 mL

## 2017-05-19 MED ORDER — SODIUM CHLORIDE 0.9 % IV SOLN
INTRAVENOUS | Status: DC | PRN
  Administered 2017-05-19: 500 mL

## 2017-05-19 MED ORDER — DEXAMETHASONE SODIUM PHOSPHATE 10 MG/ML IJ SOLN
INTRAMUSCULAR | Status: DC | PRN
  Administered 2017-05-19: 10 mg via INTRAVENOUS

## 2017-05-19 MED ORDER — HYDROCODONE-ACETAMINOPHEN 7.5-325 MG PO TABS: 1.0000 | ORAL_TABLET | Freq: Once | ORAL | Status: DC | PRN

## 2017-05-19 MED ORDER — MEPERIDINE HCL 25 MG/ML IJ SOLN: 6.2500 mg | INTRAMUSCULAR | Status: DC | PRN

## 2017-05-19 MED ORDER — PROPOFOL 10 MG/ML IV BOLUS
INTRAVENOUS | Status: AC
  Filled 2017-05-19: qty 20

## 2017-05-19 MED ORDER — FENTANYL CITRATE (PF) 100 MCG/2ML IJ SOLN
INTRAMUSCULAR | Status: DC | PRN
Start: 2017-05-19 — End: 2017-05-19
  Administered 2017-05-19 (×2): 25 ug via INTRAVENOUS

## 2017-05-19 MED ORDER — FENTANYL CITRATE (PF) 250 MCG/5ML IJ SOLN
INTRAMUSCULAR | Status: AC
  Filled 2017-05-19: qty 5

## 2017-05-19 SURGICAL SUPPLY — 41 items
ADH SKN CLS APL DERMABOND .7 (GAUZE/BANDAGES/DRESSINGS) ×1
BAG DECANTER FOR FLEXI CONT (MISCELLANEOUS) ×2 IMPLANT
BLADE HEX COATED 2.75 (ELECTRODE) ×2 IMPLANT
BLADE SURG 11 STRL SS (BLADE) ×2 IMPLANT
BLADE SURG 15 STRL LF DISP TIS (BLADE) ×1 IMPLANT
BLADE SURG 15 STRL SS (BLADE) ×2
CHLORAPREP W/TINT 10.5 ML (MISCELLANEOUS) ×2 IMPLANT
COVER SURGICAL LIGHT HANDLE (MISCELLANEOUS) ×2 IMPLANT
CRADLE DONUT ADULT HEAD (MISCELLANEOUS) ×2 IMPLANT
DECANTER SPIKE VIAL GLASS SM (MISCELLANEOUS) ×4 IMPLANT
DERMABOND ADVANCED (GAUZE/BANDAGES/DRESSINGS) ×1
DERMABOND ADVANCED .7 DNX12 (GAUZE/BANDAGES/DRESSINGS) ×1 IMPLANT
DRAPE C-ARM 42X72 X-RAY (DRAPES) ×2 IMPLANT
DRAPE CHEST BREAST 15X10 FENES (DRAPES) ×2 IMPLANT
DRAPE UTILITY XL STRL (DRAPES) ×3 IMPLANT
ELECT REM PT RETURN 9FT ADLT (ELECTROSURGICAL) ×2
ELECTRODE REM PT RTRN 9FT ADLT (ELECTROSURGICAL) ×1 IMPLANT
GAUZE SPONGE 4X4 16PLY XRAY LF (GAUZE/BANDAGES/DRESSINGS) ×2 IMPLANT
GLOVE BIO SURGEON STRL SZ 6 (GLOVE) ×2 IMPLANT
GLOVE BIOGEL PI IND STRL 6.5 (GLOVE) ×1 IMPLANT
GLOVE BIOGEL PI INDICATOR 6.5 (GLOVE) ×1
GOWN STRL REUS W/ TWL LRG LVL3 (GOWN DISPOSABLE) ×1 IMPLANT
GOWN STRL REUS W/TWL 2XL LVL3 (GOWN DISPOSABLE) ×2 IMPLANT
GOWN STRL REUS W/TWL LRG LVL3 (GOWN DISPOSABLE) ×2
KIT BASIN OR (CUSTOM PROCEDURE TRAY) ×2 IMPLANT
KIT PORT POWER 8FR ISP CVUE (Miscellaneous) ×1 IMPLANT
KIT ROOM TURNOVER OR (KITS) ×2 IMPLANT
NDL HYPO 25GX1X1/2 BEV (NEEDLE) ×1 IMPLANT
NEEDLE HYPO 25GX1X1/2 BEV (NEEDLE) ×2 IMPLANT
NS IRRIG 1000ML POUR BTL (IV SOLUTION) ×2 IMPLANT
PACK SURGICAL SETUP 50X90 (CUSTOM PROCEDURE TRAY) ×2 IMPLANT
PAD ARMBOARD 7.5X6 YLW CONV (MISCELLANEOUS) ×2 IMPLANT
PENCIL BUTTON HOLSTER BLD 10FT (ELECTRODE) ×2 IMPLANT
SUT MON AB 4-0 PC3 18 (SUTURE) ×2 IMPLANT
SUT PROLENE 2 0 SH DA (SUTURE) ×4 IMPLANT
SUT VIC AB 3-0 SH 27 (SUTURE) ×2
SUT VIC AB 3-0 SH 27X BRD (SUTURE) ×1 IMPLANT
SYR 20ML ECCENTRIC (SYRINGE) ×4 IMPLANT
SYR 5ML LUER SLIP (SYRINGE) ×2 IMPLANT
SYR CONTROL 10ML LL (SYRINGE) ×2 IMPLANT
TOWEL OR 17X24 6PK STRL BLUE (TOWEL DISPOSABLE) ×2 IMPLANT

## 2017-05-19 NOTE — Op Note (Signed)
PREOPERATIVE DIAGNOSIS:  Left breast cancer     POSTOPERATIVE DIAGNOSIS:  Same     PROCEDURE: right subclavian port placement, Bard ClearVue  Power Port, MRI safe, 8-French.      SURGEON:  [name], MD      ANESTHESIA:  General   FINDINGS:  Good venous return, easy flush, and tip of the catheter and   SVC 18 cm.      SPECIMEN:  None.      ESTIMATED BLOOD LOSS:  Minimal.      COMPLICATIONS:  None known.      PROCEDURE:  Pt was identified in the holding area and taken to   the operating room, where patient was placed supine on the operating room   table.  General anesthesia was induced.  Patient's arms were tucked and the upper   chest and neck were prepped and draped in sterile fashion.  Time-out was   performed according to the surgical safety check list.  When all was   correct, we continued.   Local anesthetic was administered over this   area at the angle of the clavicle.  The vein was accessed with 3 passes of the needle. There was good venous return and the wire passed easily with no ectopy.   Fluoroscopy was used to confirm that the wire was in the vena cava.      The patient was placed back level and the area for the pocket was anethetized   with local anesthetic.  A 3-cm transverse incision was made with a #15   blade.  Cautery was used to divide the subcutaneous tissues down to the   pectoralis muscle.  An [employer] retractor was used to elevate the skin   while a pocket was created on top of the pectoralis fascia.  The port   was placed into the pocket to confirm that it was of adequate size.  The   catheter was preattached to the port.  The port was then secured to the   pectoralis fascia with four 2-0 Prolene sutures.  These were clamped and   not tied down yet.    The catheter was tunneled through to the wire exit   site.  The catheter was placed along the wire to determine what length it should be to be in the SVC.  The catheter was cut at 18 cm.  The  tunneler sheath and dilator were passed over the wire and the dilator and wire were removed.  The catheter was advanced through the tunneler sheath and the tunneler sheath was pulled away.  Care was taken to keep the catheter in the tunneler sheath as this occurred. This was advanced and the tunneler sheath was removed.  There was good venous   return and easy flush of the catheter.  The Prolene sutures were tied   down to the pectoral fascia.  The skin was reapproximated using 3-0   Vicryl interrupted deep dermal sutures.    Fluoroscopy was used to re-confirm good position of the catheter.  The skin   was then closed using 4-0 Monocryl in a subcuticular fashion.  The port was flushed with concentrated heparin flush as well.  The wounds were then cleaned, dried, and dressed with Dermabond.  The patient was awakened from anesthesia and taken to the PACU in stable condition.  Needle, sponge, and instrument counts were correct.               [name], MD

## 2017-05-19 NOTE — Discharge Instructions (Addendum)
[facility] Office Phone Number [phone]   POST OP INSTRUCTIONS  Always review your discharge instruction sheet given to you by the facility where your surgery was performed.  IF YOU HAVE DISABILITY OR FAMILY LEAVE FORMS, YOU MUST BRING THEM TO THE OFFICE FOR PROCESSING.  DO NOT GIVE THEM TO YOUR DOCTOR.  1. A prescription for pain medication may be given to you upon discharge.  Take your pain medication as prescribed, if needed.  If narcotic pain medicine is not needed, then you may take acetaminophen (Tylenol) or ibuprofen (Advil) as needed. 2. Take your usually prescribed medications unless otherwise directed 3. If you need a refill on your pain medication, please contact your pharmacy.  They will contact our office to request authorization.  Prescriptions will not be filled after 5pm or on week-ends. 4. You should eat very light the first 24 hours after surgery, such as soup, crackers, pudding, etc.  Resume your normal diet the day after surgery 5. It is common to experience some constipation if taking pain medication after surgery.  Increasing fluid intake and taking a stool softener will usually help or prevent this problem from occurring.  A mild laxative (Milk of Magnesia or Miralax) should be taken according to package directions if there are no bowel movements after 48 hours. 6. You may shower in 48 hours.  The surgical glue will flake off in 2-3 weeks.   7. ACTIVITIES:  No strenuous activity or heavy lifting for 1 week.   a. You may drive when you no longer are taking prescription pain medication, you can comfortably wear a seatbelt, and you can safely maneuver your car and apply brakes. b. RETURN TO WORK:  __________1 week or as tolerated if no lifting and if chemo is going well _______________ Dennis Bast should see your doctor in the office for a follow-up appointment approximately three-four weeks after your surgery.    WHEN TO CALL YOUR DOCTOR: 1. Fever over  101.0 2. Nausea and/or vomiting. 3. Extreme swelling or bruising. 4. Continued bleeding from incision. 5. Increased pain, redness, or drainage from the incision.  The clinic staff is available to answer your questions during regular business hours.  Please dont hesitate to call and ask to speak to one of the nurses for clinical concerns.  If you have a medical emergency, go to the nearest emergency room or call 911.  A surgeon from [facility] is always on call at the hospital.  For further questions, please visit [email or web address]

## 2017-05-19 NOTE — Anesthesia Procedure Notes (Signed)
Procedure Name: LMA Insertion Date/Time: 05/19/2017 1:53 PM Performed by: Garrison Columbus T Pre-anesthesia Checklist: Patient identified, Emergency Drugs available, Suction available and Patient being monitored Patient Re-evaluated:Patient Re-evaluated prior to induction Oxygen Delivery Method: Circle System Utilized Preoxygenation: Pre-oxygenation with 100% oxygen Induction Type: IV induction Ventilation: Mask ventilation without difficulty LMA: LMA inserted LMA Size: 4.0 Number of attempts: 1 Airway Equipment and Method: Bite block Placement Confirmation: positive ETCO2 Tube secured with: Tape Dental Injury: Teeth and Oropharynx as per pre-operative assessment

## 2017-05-19 NOTE — Anesthesia Preprocedure Evaluation (Signed)
Anesthesia Evaluation  Patient identified by MRN, date of birth, ID band Patient awake    Reviewed: Allergy & Precautions, NPO status , Patient's Chart, lab work & pertinent test results  Airway Mallampati: II  TM Distance: >3 FB Neck ROM: Full    Dental   Pulmonary former smoker,    Pulmonary exam normal breath sounds clear to auscultation       Cardiovascular hypertension, Pt. on medications Normal cardiovascular exam Rhythm:Regular Rate:Normal     Neuro/Psych  Headaches, negative psych ROS   GI/Hepatic Neg liver ROS, GERD  Medicated and Controlled,  Endo/Other  Obesity Left Breast Ca  Renal/GU negative Renal ROS  negative genitourinary   Musculoskeletal negative musculoskeletal ROS (+)   Abdominal (+) + obese,   Peds  Hematology  (+) anemia ,   Anesthesia Other Findings   Reproductive/Obstetrics negative OB ROS                             Anesthesia Physical Anesthesia Plan  ASA: II  Anesthesia Plan: General   Post-op Pain Management:    Induction: Intravenous  PONV Risk Score and Plan: 4 or greater and Ondansetron, Dexamethasone, Midazolam, Propofol infusion and Metaclopromide  Airway Management Planned: LMA  Additional Equipment:   Intra-op Plan:   Post-operative Plan: Extubation in OR  Informed Consent: I have reviewed the patients History and Physical, chart, labs and discussed the procedure including the risks, benefits and alternatives for the proposed anesthesia with the patient or authorized representative who has indicated his/her understanding and acceptance.   Dental advisory given  Plan Discussed with: CRNA, Anesthesiologist and Surgeon  Anesthesia Plan Comments:         Anesthesia Quick Evaluation

## 2017-05-19 NOTE — Transfer of Care (Signed)
Immediate Anesthesia Transfer of Care Note  Patient: Briana Price  Procedure(s) Performed: Procedure(s): INSERTION PORT-A-CATH (Right)  Patient Location: PACU  Anesthesia Type:General  Level of Consciousness: awake, alert  and oriented  Airway & Oxygen Therapy: Patient Spontanous Breathing  Post-op Assessment: Report given to RN, Post -op Vital signs reviewed and stable and Patient moving all extremities X 4  Post vital signs: Reviewed and stable  Last Vitals:  Vitals:   05/19/17 1146 05/19/17 1147  BP: (!) 146/88   Pulse: 89   Resp: 18   Temp:  36.9 C  SpO2: 100%     Last Pain: There were no vitals filed for this visit.    Patients Stated Pain Goal: 3 (32/76/14 7092)  Complications: No apparent anesthesia complications

## 2017-05-19 NOTE — Anesthesia Postprocedure Evaluation (Signed)
Anesthesia Post Note  Patient: Briana Price  Procedure(s) Performed: Procedure(s) (LRB): INSERTION PORT-A-CATH (Right)     Patient location during evaluation: PACU Anesthesia Type: General Level of consciousness: awake and alert Pain management: pain level controlled Vital Signs Assessment: post-procedure vital signs reviewed and stable Respiratory status: spontaneous breathing, nonlabored ventilation and respiratory function stable Cardiovascular status: blood pressure returned to baseline and stable Postop Assessment: no signs of nausea or vomiting Anesthetic complications: no    Last Vitals:  Vitals:   05/19/17 1147 05/19/17 1452  BP:  (!) 141/86  Pulse:  73  Resp:  14  Temp: 36.9 C 36.5 C  SpO2:  100%    Last Pain:  Vitals:   05/19/17 1500  PainSc: 0-No pain                 Arsenio Schnorr A.

## 2017-05-19 NOTE — Interval H&P Note (Signed)
History and Physical Interval Note:  05/19/2017 11:38 AM  Briana Price  has presented today for surgery, with the diagnosis of left breast cancer  The various methods of treatment have been discussed with the patient and family. After consideration of risks, benefits and other options for treatment, the patient has consented to  Procedure(s): INSERTION PORT-A-CATH (N/A) as a surgical intervention .  The patient's history has been reviewed, patient examined, no change in status, stable for surgery.  I have reviewed the patient's chart and labs.  Questions were answered to the patient's satisfaction.     Raheen Capili

## 2017-05-20 ENCOUNTER — Telehealth: Payer: Self-pay | Admitting: Hematology

## 2017-05-20 ENCOUNTER — Encounter (HOSPITAL_COMMUNITY): Payer: Self-pay | Admitting: General Surgery

## 2017-05-20 DIAGNOSIS — H16141 Punctate keratitis, right eye: Secondary | ICD-10-CM | POA: Diagnosis not present

## 2017-05-20 DIAGNOSIS — H538 Other visual disturbances: Secondary | ICD-10-CM | POA: Diagnosis not present

## 2017-05-20 DIAGNOSIS — H02203 Unspecified lagophthalmos right eye, unspecified eyelid: Secondary | ICD-10-CM | POA: Diagnosis not present

## 2017-05-20 DIAGNOSIS — H524 Presbyopia: Secondary | ICD-10-CM | POA: Diagnosis not present

## 2017-05-20 DIAGNOSIS — H00029 Hordeolum internum unspecified eye, unspecified eyelid: Secondary | ICD-10-CM | POA: Diagnosis not present

## 2017-05-20 NOTE — Telephone Encounter (Signed)
Per 9/11 no los °

## 2017-05-20 NOTE — Progress Notes (Signed)
[facility]  [phone] [phone]  Clinic follow up Note   Patient Care Team: [name], MD as PCP - General (Family Medicine) [name], MD as Consulting Physician (Hematology) [name], MD as Consulting Physician (General Surgery) [name], MD as Consulting Physician (Radiation Oncology) 05/22/2017  CHIEF COMPLAINTS:  Malignant neoplasm of lower-outer quadrant of left breast of female, triple negative   Oncology History   Cancer Staging Malignant neoplasm of lower-outer quadrant of left breast of female, estrogen receptor negative ([place]) Staging form: Breast, AJCC 8th Edition - Clinical: Stage IIB (cT2, cN0(f), cM0, G3, ER: Negative, PR: Negative, HER2: Negative) - Unsigned       Malignant neoplasm of lower-outer quadrant of left breast of female, estrogen receptor negative ([place])   04/27/2017 Mammogram    IMPRESSION: 1. Highly suspicious mass within the LEFT breast at the 5:30 o'clock axis, 4 cm from the nipple, measuring 2.5 cm, corresponding to the mammographic finding and corresponding to 1 of the palpable lumps in the left breast. Ultrasound-guided biopsy is recommended. 2. Mildly prominent lymph node in the LEFT axilla, with normal cortical thickness but with questionable effacement of the fatty hilum. Ultrasound-guided core biopsy is recommended. 3. No evidence of malignancy within the RIGHT breast. Also, no evidence of malignancy within the second area of clinical concern in the upper outer left breast.      04/29/2017 Initial Diagnosis    Malignant neoplasm of lower-outer quadrant of left breast of female, estrogen receptor negative ([place])      04/29/2017 Receptors her2    Estrogen Receptor: 0%, NEGATIVE Progesterone Receptor: 0%, NEGATIVE Proliferation Marker Ki67: 90% HER2 NEGATIVE       04/29/2017 Initial Biopsy     Diagnosis 1. 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 - SEE COMMENT. 2. Lymph node, needle/core biopsy, left axilla - THERE IN NO EVIDENCE OF CARCINOMA IN 1 OF 1 LYMPH NODE (0/1).      05/14/2017 Imaging    MRI of Breast Bilateral 05/14/17  IMPRESSION: 1. 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 2. Non mass enhancement extending approximately 3.2 cm anterior to the mass in the lower inner quadrant of the left breast, suspicious for DCIS. There is no correlate on recent mammography. The overall extent of the mass and non mass enhancement is approximately 5 cm. 3. No MRI evidence of malignancy involving the right breast. 4. No pathologic lymphadenopathy. Upper normal sized left axillary lymph nodes, the largest of which was previously biopsied no evidence of metastatic disease.      05/15/2017 Imaging    Bone scan whole body 05/15/17 IMPRESSION: No findings specific for metastatic disease to bone.      05/15/2017 Imaging    CT CAP W contrast 05/15/17  IMPRESSION: 1. 18 mm soft tissue lesion inferior left breast compatible with known primary malignancy. 2. Mildly enlarged left axillary lymph nodes in this patient with biopsy-proven metastatic disease to the left axilla. 3. No evidence for lymphadenopathy elsewhere in the chest. No lymphadenopathy in the abdomen or pelvis. No evidence for metastatic disease in the abdomen or pelvis. 4. Tiny right perifissural lung nodule most likely subpleural lymph node. Attention on follow-up recommended. 5. 7 mm hypoattenuating lesion in the spleen, likely a cyst or pseudocyst. Attention on follow-up recommended. 6. Uterine fibroids. 7.  Aortic Atherosclerois (ICD10-170.0)  05/19/2017 Procedure    Port placement by Dr. [name] on 9/11/8      05/22/2017 -  Chemotherapy    neoadjuvant chemotherpy AC every 2 weeks for 4 cycles, followed by  paclitaxel every 2 weeks for 4 cycles starting 05/22/17        HISTORY OF PRESENTING ILLNESS: 8/29/8  Briana Price 51 y.o. 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  She presents to the clinic today with her mother and father.  She first noticed her a mass in her left breast on the 17th of August, It was a hard knot, no pain or tenderness. Her mammogram form 03/24/17 and other previous mammograms were all normal.   In the past she was diagnosed with HTN and takes hydrochlorothiazide and lisinopril. She had her gallbladder removed. Her maternal grandmother, maternal aunt and paternal aunt had breast cancer. 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 The pain can last up to 3 days.   Today she denies skin change around breast, pain, fatigue or weight loss. She still has regular periods that last 3 days. She work in [employer] for the [employer].   GYN HISTORY  Menarchal: 14 LMP: 05/01/17 Contraceptive: No HRT: n/a GP: G1P1 at age 45, she has a son  CURRENT THERAPY: neoadjuvant chemotherpy AC every 2 weeks for 4 cycles, followed by weekly paclitaxel with or without carboplatin for 12 cycles starting 05/22/17  INTERVAL HISTORY:  Briana Price is here for a follow up and cycle 1 of AC. She presents to the clinic today with her family.  She is here after her biopsy.  He eye was inflamed and she was given medication and eye drops. Overall she is ready to start her treatment today.  She wonders if she will make her son's football games on Thursdays. She will be going back to work and would like a note to wear her mask at work while she undergoes chemo.      MEDICAL HISTORY:  Past Medical History:  Diagnosis Date  . Anemia   . Cancer [facility])    left breast cancer  . GERD (gastroesophageal reflux disease)   . Headache    Migraines  . Hypertension    SURGICAL HISTORY: Past Surgical History:  Procedure Laterality Date  . BREAST SURGERY     biopsy    . CESAREAN SECTION    . CHOLECYSTECTOMY N/A 02/07/2013   Procedure: LAPAROSCOPIC CHOLECYSTECTOMY WITH INTRAOPERATIVE CHOLANGIOGRAM;  Surgeon: Gwenyth Ober, MD;  Location: [place];  Service: General;  Laterality: N/A;  . COLONOSCOPY W/ BIOPSIES AND POLYPECTOMY    . FOOT SURGERY  2003   lt foot bunionectomy  . MULTIPLE TOOTH EXTRACTIONS    . PORTACATH PLACEMENT Right 05/19/2017   Procedure: INSERTION PORT-A-CATH;  Surgeon: [name], MD;  Location: [place];  Service: General;  Laterality: Right;   SOCIAL HISTORY: Social History   Social History  . Marital status: Single    Spouse name: N/A  . Number of children: N/A  . Years of education: N/A   Occupational History  . Not on file.   Social History Main Topics  . Smoking status: Former Smoker    Types: Cigarettes  . Smokeless tobacco: Never Used     Comment: 15-20 years ago  . Alcohol use No  . Drug use: No  . Sexual activity: Not on file   Other Topics Concern  . Not on file  Social History Narrative   Tobacco use   Cigarettes: Never smoked    Tobacco history last updated 01/23/2014   No smoking   No tobacco exposure   No alcohol   Caffeine : yes soda   No recreational drug use   Occupation :employed ,[employer] 01/25/2013   Martial status : single    Children: Louie Casa 01/25/2013   19 year old son   FAMILY HISTORY: Family History  Problem Relation Age of Onset  . Breast cancer Maternal Grandmother   . Breast cancer Paternal Aunt   . Breast cancer Maternal Aunt   . Hypertension Mother   . Breast cancer Other    ALLERGIES:  has No Known Allergies.  MEDICATIONS:  Current Outpatient Prescriptions  Medication Sig Dispense Refill  . aspirin-acetaminophen-caffeine (EXCEDRIN MIGRAINE) 250-250-65 MG tablet Take 2 tablets by mouth 2 (two) times daily as needed for headache.    . Cholecalciferol (VITAMIN D) 2000 units CAPS Take 2,000 Units by mouth daily.    . fluocinonide ointment (LIDEX)  4.25 % Apply 1 application topically daily as needed (rosacea).    . hydrochlorothiazide (HYDRODIURIL) 25 MG tablet Take 25 mg by mouth daily.    Marland Kitchen lidocaine-prilocaine (EMLA) cream Apply to affected area once 30 g 3  . lisinopril (PRINIVIL,ZESTRIL) 10 MG tablet Take 10 mgs by mouth daily  2  . ondansetron (ZOFRAN) 8 MG tablet Take 1 tablet (8 mg total) by mouth 2 (two) times daily as needed. Start on the third day after chemotherapy. 30 tablet 1  . oxyCODONE (OXY IR/ROXICODONE) 5 MG immediate release tablet Take 1-2 tablets (5-10 mg total) by mouth every 6 (six) hours as needed for moderate pain, severe pain or breakthrough pain. 20 tablet 0  . prochlorperazine (COMPAZINE) 10 MG tablet Take 1 tablet (10 mg total) by mouth every 6 (six) hours as needed (Nausea or vomiting). 30 tablet 1   No current facility-administered medications for this visit.    Facility-Administered Medications Ordered in Other Visits  Medication Dose Route Frequency Provider Last Rate Last Dose  . sodium chloride flush (NS) 0.9 % injection 10 mL  10 mL Intracatheter PRN [name], MD   10 mL at 05/22/17 1708   REVIEW OF SYSTEMS:   Constitutional: Denies fevers, chills or abnormal night sweats Eyes: Denies blurriness of vision, double vision or watery eyes Ears, nose, mouth, throat, and face: Denies mucositis or sore throat Respiratory: Denies cough, dyspnea or wheezes Cardiovascular: Denies palpitation, chest discomfort or lower extremity swelling Gastrointestinal:  Denies nausea, heartburn or change in bowel habits Skin: Denies abnormal skin rashes Lymphatics: Denies new lymphadenopathy or easy bruising Neurological:Denies numbness, tingling or new weaknesses Behavioral/Psych: Mood is stable, no new changes. Breast: (+) Palpable lump in right breast All other systems were reviewed with the patient and are negative.  PHYSICAL EXAMINATION: ECOG PERFORMANCE STATUS: 0 - Asymptomatic BP 130/86 (BP Location: Right  Arm, Patient Position: Sitting)   Pulse 70   Temp 98.5 F (36.9 C) (Oral)   Resp 17   Ht 5' 6" (1.676 m)   Wt 217 lb 8 oz (98.7 kg)   LMP 05/02/2017   SpO2 100%   BMI 35.11 kg/m   Vitals:   05/22/17 1355  BP: 130/86  Pulse: 70  Resp: 17  Temp: 98.5 F (36.9 C)  TempSrc: Oral  SpO2: 100%  Weight: 217 lb 8 oz (98.7 kg)  Height: 5' 6" (1.676 m)    GENERAL:alert, no distress and comfortable  SKIN: skin color, texture, turgor are normal, no rashes or significant lesions EYES: normal, conjunctiva are pink and non-injected, sclera clear OROPHARYNX:no exudate, no erythema and lips, buccal mucosa, and tongue normal  NECK: supple, thyroid normal size, non-tender, without nodularity LYMPH:  no palpable lymphadenopathy in the cervical, axillary or inguinal LUNGS: clear to auscultation and percussion with normal breathing effort HEART: regular rate & rhythm and no murmurs and no lower extremity edema ABDOMEN:abdomen soft, non-tender and normal bowel sounds Musculoskeletal:no cyanosis of digits and no clubbing  PSYCH: alert & oriented x 3 with fluent speech NEURO: no focal motor/sensory deficits Breast: (+) no palpable mass in left axilla, (+) palpable mass in 7:00 position Inner lower quadrant 2.5x3.0 cm with skin ecchymosis.  (+) no palpable mass in right breast or right axilla, normal   LABORATORY DATA:  I have reviewed the data as listed CBC Latest Ref Rng & Units 05/22/2017 05/06/2017 02/07/2013  WBC 3.9 - 10.3 10e3/uL 9.7 8.4 -  Hemoglobin 11.6 - 15.9 g/dL 11.2(L) 11.9 12.6  Hematocrit 34.8 - 46.6 % 34.8 36.2 -  Platelets 145 - 400 10e3/uL 377 435(H) -   CMP Latest Ref Rng & Units 05/22/2017 05/06/2017 02/04/2013  Glucose 70 - 140 mg/dl 91 110 92  BUN 7.0 - 26.0 mg/dL 11.0 7.2 12  Creatinine 0.6 - 1.1 mg/dL 0.7 0.7 0.66  Sodium 136 - 145 mEq/L 137 140 136  Potassium 3.5 - 5.1 mEq/L 3.6 4.2 3.8  Chloride 96 - 112 mEq/L - - 100  CO2 22 - 29 mEq/L 27 30(H) 26  Calcium 8.4 - 10.4  mg/dL 9.4 10.0 9.3  Total Protein 6.4 - 8.3 g/dL 7.2 8.1 -  Total Bilirubin 0.20 - 1.20 mg/dL 0.62 0.61 -  Alkaline Phos 40 - 150 U/L 58 70 -  AST 5 - 34 U/L 15 21 -  ALT 0 - 55 U/L 12 20 -   PATHOLOGY  Diagnosis 04/29/17 1. Breast, left, needle core biopsy, 5:30 o'clock, mass - INVASIVE DUCTAL CARCINOMA. - DUCTAL CARCINOMA IN SITU. - SEE COMMENT. 2. Lymph node, needle/core biopsy, left axilla - THERE IN NO EVIDENCE OF CARCINOMA IN 1 OF 1 LYMPH NODE (0/1). Microscopic Comment 1. The carcinoma appears grade 2-3. A breast prognostic profile will be performed and the results reported separately. The results were called to [facility] on 04/30/17. (JBK:gt, 04/30/17) 1. PROGNOSTIC INDICATORS Results: IMMUNOHISTOCHEMICAL AND MORPHOMETRIC ANALYSIS PERFORMED MANUALLY Estrogen Receptor: 0%, NEGATIVE Progesterone Receptor: 0%, NEGATIVE Proliferation Marker Ki67: 90% COMMENT: The negative hormone receptor study(ies) in this case has an internal positive control. 1. 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 Systolic function was normal. The estimated ejection fraction was in the range of 55% to 60%. Wall motion was normal; there were no regional wall motion abnormalities. Left ventricular diastolic function parameters were normal.  RADIOGRAPHIC STUDIES: I have personally reviewed the radiological images as listed and agreed with the findings in the report.  Bone scan whole body 05/15/17 IMPRESSION: No findings specific for metastatic disease to bone.  CT CAP W contrast 05/15/17  IMPRESSION: 1. 18 mm soft tissue lesion inferior left breast compatible with known primary malignancy. 2. Mildly enlarged left axillary lymph nodes in this patient with biopsy-proven metastatic disease to the  left axilla. 3. No evidence for lymphadenopathy elsewhere in the chest.  No lymphadenopathy in the abdomen or pelvis. No evidence for metastatic disease in the abdomen or pelvis. 4. Tiny right perifissural lung nodule most likely subpleural lymph node. Attention on follow-up recommended. 5. 7 mm hypoattenuating lesion in the spleen, likely a cyst or pseudocyst. Attention on follow-up recommended. 6. Uterine fibroids. 7.  Aortic Atherosclerois (ICD10-170.0)  Ct Chest W Contrast  Result Date: 05/15/2017 CLINICAL DATA:  Breast cancer EXAM: CT CHEST, ABDOMEN, AND PELVIS WITH CONTRAST TECHNIQUE: Multidetector CT imaging of the chest, abdomen and pelvis was performed following the standard protocol during bolus administration of intravenous contrast. CONTRAST:  13m ISOVUE-300 IOPAMIDOL (ISOVUE-300) INJECTION 61% COMPARISON:  Abdomen and pelvis CT 06/25/2016 FINDINGS: CT CHEST FINDINGS Cardiovascular: Heart is enlarged. No pericardial effusion. No thoracic aortic aneurysm. Mediastinum/Nodes: No mediastinal lymphadenopathy. There is no hilar lymphadenopathy. 12 mm short axis left axillary lymph node is identified on image 19 series 2. Another 10 mm short axis left axillary lymph node is seen on image 18. While the these are only minimally increased in size, there are asymmetric when comparing to the right. Lungs/Pleura: Tiny 1-2 mm perifissural nodule right lung identified image 56 series 6. Lungs otherwise clear. Musculoskeletal: Bone windows reveal no worrisome lytic or sclerotic osseous lesions. 18 mm soft tissue lesion identified inferior left breast with apparent localization clip, compatible with primary lesion. CT ABDOMEN PELVIS FINDINGS Hepatobiliary: No focal abnormality within the liver parenchyma. Gallbladder surgically absent. No intrahepatic or extrahepatic biliary dilation. Pancreas: No focal mass lesion. No dilatation of the main duct. No intraparenchymal cyst. No peripancreatic edema. Spleen: No  splenomegaly. 7 mm hypoattenuating lesion identified in the subcapsular spleen (image 50 series 2), not seen on prior study. Adrenals/Urinary Tract: No adrenal nodule or mass. Kidneys unremarkable. No evidence for hydroureter. The urinary bladder appears normal for the degree of distention. Stomach/Bowel: Tiny hiatal hernia. Stomach otherwise unremarkable. Duodenum is normally positioned as is the ligament of Treitz. No small bowel wall thickening. No small bowel dilatation. The terminal ileum is normal. The appendix is normal. Diverticuli are seen scattered along the entire length of the colon without CT findings of diverticulitis. Vascular/Lymphatic: There is abdominal aortic atherosclerosis without aneurysm. Portal vein and superior mesenteric vein are patent. Duplication of IVC evident. There is no gastrohepatic or hepatoduodenal ligament lymphadenopathy. No intraperitoneal or retroperitoneal lymphadenopathy. Small lymph nodes are seen along each pelvic sidewall, similar to prior, without lymphadenopathy. Reproductive: Fibroid changes noted in the uterus. There is no adnexal mass. Other: No intraperitoneal free fluid. Musculoskeletal: Bone windows reveal no worrisome lytic or sclerotic osseous lesions. Bone windows reveal no worrisome lytic or sclerotic osseous lesions. IMPRESSION: 1. 18 mm soft tissue lesion inferior left breast compatible with known primary malignancy. 2. Mildly enlarged left axillary lymph nodes in this patient with biopsy-proven metastatic disease to the left axilla. 3. No evidence for lymphadenopathy elsewhere in the chest. No lymphadenopathy in the abdomen or pelvis. No evidence for metastatic disease in the abdomen or pelvis. 4. Tiny right perifissural lung nodule most likely subpleural lymph node. Attention on follow-up recommended. 5. 7 mm hypoattenuating lesion in the spleen, likely a cyst or pseudocyst. Attention on follow-up recommended. 6. Uterine fibroids. 7.  Aortic Atherosclerois  (ICD10-170.0) Electronically Signed   By: EMisty StanleyM.D.   On: 05/15/2017 15:43   Nm Bone Scan Whole Body  Result Date: 05/15/2017 CLINICAL DATA:  51year old female with left breast cancer. Staging. EXAM: NUCLEAR MEDICINE WHOLE BODY BONE SCAN TECHNIQUE: Whole body anterior and posterior images were obtained  approximately 3 hours after intravenous injection of radiopharmaceutical. RADIOPHARMACEUTICALS:  18.6 mCi Technetium-56mMDP IV COMPARISON:  CT chest, Abdomen and Pelvis today reported separately. FINDINGS: Expected radiotracer activity in both kidneys in the urinary bladder. Minimal perineum contamination. Homogeneous radiotracer activity throughout the axial skeleton including the skull. Homogeneous radiotracer activity throughout the visible upper extremities. Homogeneous radiotracer activity throughout the lower extremities except for degenerative appearing increased activity along the medial compartment of the left knee joint. IMPRESSION: No findings specific for metastatic disease to bone. Electronically Signed   By: HGenevie AnnM.D.   On: 05/15/2017 14:35   Ct Abdomen Pelvis W Contrast  Result Date: 05/15/2017 CLINICAL DATA:  Breast cancer EXAM: CT CHEST, ABDOMEN, AND PELVIS WITH CONTRAST TECHNIQUE: Multidetector CT imaging of the chest, abdomen and pelvis was performed following the standard protocol during bolus administration of intravenous contrast. CONTRAST:  1015mISOVUE-300 IOPAMIDOL (ISOVUE-300) INJECTION 61% COMPARISON:  Abdomen and pelvis CT 06/25/2016 FINDINGS: CT CHEST FINDINGS Cardiovascular: Heart is enlarged. No pericardial effusion. No thoracic aortic aneurysm. Mediastinum/Nodes: No mediastinal lymphadenopathy. There is no hilar lymphadenopathy. 12 mm short axis left axillary lymph node is identified on image 19 series 2. Another 10 mm short axis left axillary lymph node is seen on image 18. While the these are only minimally increased in size, there are asymmetric when comparing to  the right. Lungs/Pleura: Tiny 1-2 mm perifissural nodule right lung identified image 56 series 6. Lungs otherwise clear. Musculoskeletal: Bone windows reveal no worrisome lytic or sclerotic osseous lesions. 18 mm soft tissue lesion identified inferior left breast with apparent localization clip, compatible with primary lesion. CT ABDOMEN PELVIS FINDINGS Hepatobiliary: No focal abnormality within the liver parenchyma. Gallbladder surgically absent. No intrahepatic or extrahepatic biliary dilation. Pancreas: No focal mass lesion. No dilatation of the main duct. No intraparenchymal cyst. No peripancreatic edema. Spleen: No splenomegaly. 7 mm hypoattenuating lesion identified in the subcapsular spleen (image 50 series 2), not seen on prior study. Adrenals/Urinary Tract: No adrenal nodule or mass. Kidneys unremarkable. No evidence for hydroureter. The urinary bladder appears normal for the degree of distention. Stomach/Bowel: Tiny hiatal hernia. Stomach otherwise unremarkable. Duodenum is normally positioned as is the ligament of Treitz. No small bowel wall thickening. No small bowel dilatation. The terminal ileum is normal. The appendix is normal. Diverticuli are seen scattered along the entire length of the colon without CT findings of diverticulitis. Vascular/Lymphatic: There is abdominal aortic atherosclerosis without aneurysm. Portal vein and superior mesenteric vein are patent. Duplication of IVC evident. There is no gastrohepatic or hepatoduodenal ligament lymphadenopathy. No intraperitoneal or retroperitoneal lymphadenopathy. Small lymph nodes are seen along each pelvic sidewall, similar to prior, without lymphadenopathy. Reproductive: Fibroid changes noted in the uterus. There is no adnexal mass. Other: No intraperitoneal free fluid. Musculoskeletal: Bone windows reveal no worrisome lytic or sclerotic osseous lesions. Bone windows reveal no worrisome lytic or sclerotic osseous lesions. IMPRESSION: 1. 18 mm soft  tissue lesion inferior left breast compatible with known primary malignancy. 2. Mildly enlarged left axillary lymph nodes in this patient with biopsy-proven metastatic disease to the left axilla. 3. No evidence for lymphadenopathy elsewhere in the chest. No lymphadenopathy in the abdomen or pelvis. No evidence for metastatic disease in the abdomen or pelvis. 4. Tiny right perifissural lung nodule most likely subpleural lymph node. Attention on follow-up recommended. 5. 7 mm hypoattenuating lesion in the spleen, likely a cyst or pseudocyst. Attention on follow-up recommended. 6. Uterine fibroids. 7.  Aortic Atherosclerois (ICD10-170.0) Electronically Signed  By: Misty Stanley M.D.   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 Left axillary lymph node biopsy revealed no evidence of metastatic disease. Evaluation prior to neoadjuvant chemotherapy. LABS:  Not applicable. EXAM: BILATERAL BREAST MRI WITH AND WITHOUT CONTRAST TECHNIQUE: Multiplanar, multisequence MR images of both breasts were obtained prior to and following the intravenous administration of 20 ml of MultiHance. THREE-DIMENSIONAL MR IMAGE RENDERING ON INDEPENDENT WORKSTATION: Three-dimensional MR images were rendered by post-processing of the original MR data on an independent workstation. The three-dimensional MR images were interpreted, and findings are reported in the following complete MRI report for this study. Three dimensional images were evaluated at the independent DynaCad workstation. COMPARISON:  No prior MRI. Mammography 04/29/2017, 04/27/2017 and earlier. Right breast ultrasound 04/29/2017, 04/27/2017. FINDINGS: Breast composition: c. Heterogeneous fibroglandular tissue. Background parenchymal enhancement: Moderate. Right breast: No mass or abnormal  enhancement. Left breast: Enhancing mass involving the lower outer left breast at posterior depth measuring approximately 2.5 (AP) X 2.1 (transverse) X 2.1 (craniocaudal) cm, demonstrating washout kinetics, biopsy-proven invasive ductal carcinoma, within which there is blooming artifact from the clip placed at the time of biopsy. Non mass enhancement extending from the mass anteriorly into the lower inner quadrant, measuring approximately 3.2 x 1.7 x 0.9 cm, demonstrating plateau kinetics. There is no correlate on scratch the recent mammography. In total, the mass and the contiguous non mass enhancement span approximately 5 cm. No abnormal enhancement elsewhere in the left breast. Lymph nodes: Upper normal sized left axillary lymph nodes, the more anterior of which measures approximately 1.9 cm and the more posterior of which measures approximately 1.5 cm. The more anterior lymph node was previous biopsied, demonstrating no evidence of metastatic disease. Ancillary findings:  None. IMPRESSION: 1. 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 2. Non mass enhancement extending approximately 3.2 cm anterior to the mass in the lower inner quadrant of the left breast, suspicious for DCIS. There is no correlate on recent mammography. The overall extent of the mass and non mass enhancement is approximately 5 cm. 3. No MRI evidence of malignancy involving the right breast. 4. No pathologic lymphadenopathy. Upper normal sized left axillary lymph nodes, the largest of which was previously biopsied no evidence of metastatic disease. RECOMMENDATION: MRI guided biopsy of the anterior extent of the non mass enhancement in order to confirm the overall extent of disease involving the lower outer and lower inner quadrants of the left breast. BI-RADS CATEGORY  4: Suspicious. Electronically Signed   By: Evangeline Dakin M.D.   On:  05/14/2017 16:32   Dg Chest Port 1 View  Result Date: 05/19/2017 CLINICAL DATA:  Intraoperative Port-A-Cath placement. 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 EXAM: PORTABLE CHEST 1 VIEW COMPARISON:  CT chest 05/15/2017. FINDINGS: Right subclavian Port-A-Cath tip projects over the mid SVC. No evidence of pneumothorax or mediastinal hematoma. Near expiratory image accounts for the crowded bronchovascular markings at the bases. Taking this into account, lungs clear. No pleural effusions. Cardiac silhouette upper normal in size. Pulmonary vascularity normal. IMPRESSION: 1. Right subclavian central venous catheter tip projects over the mid SVC. No complicating features. 2. No acute cardiopulmonary disease. Near expiratory image accounts for crowded  markings at the bases. Electronically Signed   By: Evangeline Dakin M.D.   On: 05/19/2017 16:49   Dg Fluoro Guide Cv Line-no Report  Result Date: 05/19/2017 Fluoroscopy was utilized by the requesting physician.  No radiographic interpretation.   US Breast Ltd Uni Left Inc Axilla  Result Date: 04/27/2017 CLINICAL DATA:  Patient describes palpable masses within each breast (1 on the right and 2 on the left). EXAM: 2D DIGITAL DIAGNOSTIC BILATERAL MAMMOGRAM WITH CAD AND ADJUNCT TOMO ULTRASOUND BILATERAL BREAST COMPARISON:  Previous exam(s). ACR Breast Density Category c: The breast tissue is heterogeneously dense, which may obscure small masses. FINDINGS: There is an irregular mass within the inferior left breast, at posterior depth, measuring approximately 2 cm greatest dimension. There are no additional masses, suspicious calcifications or secondary signs of malignancy within either breast. Specifically, there are no mammographic abnormalities within the outer breasts bilaterally corresponding to the additional areas of clinical concern. Mammographic images were processed with CAD. LEFT breast:  Targeted ultrasound is performed, showing an irregular hypoechoic mass in the left breast at the 5:30 o'clock axis, 4 cm from the nipple, measuring 2.5 x 1.6 x 2 cm, corresponding to the mammographic finding and corresponding to 1 of the sites of clinical concern. Targeted ultrasound also performed of the outer left breast corresponding to the additional area of clinical concern, showing only normal fibroglandular tissues and fat lobules throughout. RIGHT breast: Targeted ultrasound is performed, evaluating the outer right breast corresponding to the area of clinical concern, showing only normal fibroglandular tissues and fat lobules. No suspicious solid or cystic mass is identified. LEFT axilla was evaluated with ultrasound showing a single mildly prominent lymph node, measuring 1.3 cm greatest dimension, with normal cortical thickness of 3.5 mm but with questionable effacement of the fatty hilum. IMPRESSION: 1. Highly suspicious mass within the LEFT breast at the 5:30 o'clock axis, 4 cm from the nipple, measuring 2.5 cm, corresponding to the mammographic finding and corresponding to 1 of the palpable lumps in the left breast. Ultrasound-guided biopsy is recommended. 2. Mildly prominent lymph node in the LEFT axilla, with normal cortical thickness but with questionable effacement of the fatty hilum. Ultrasound-guided core biopsy is recommended. 3. No evidence of malignancy within the RIGHT breast. Also, no evidence of malignancy within the second area of clinical concern in the upper outer left breast. RECOMMENDATION: 1. Ultrasound-guided biopsy of the left breast mass at the 5:30 o'clock axis. 2. Ultrasound-guided biopsy of the single mildly prominent lymph node in the left axilla. Ultrasound-guided biopsies are scheduled for August 22nd. I have discussed the findings and recommendations with the patient. Results were also provided in writing at the conclusion of the visit. If applicable, a reminder letter will be  sent to the patient regarding the next appointment. BI-RADS CATEGORY  5: Highly suggestive of malignancy. Electronically Signed   By: Franki Cabot M.D.   On: 04/27/2017 16:40   US Breast Ltd Uni Right Inc Axilla  Result Date: 04/27/2017 CLINICAL DATA:  Patient describes palpable masses within each breast (1 on the right and 2 on the left). EXAM: 2D DIGITAL DIAGNOSTIC BILATERAL MAMMOGRAM WITH CAD AND ADJUNCT TOMO ULTRASOUND BILATERAL BREAST COMPARISON:  Previous exam(s). ACR Breast Density Category c: The breast tissue is heterogeneously dense, which may obscure small masses. FINDINGS: There is an irregular mass within the inferior left breast, at posterior depth, measuring approximately 2 cm greatest dimension. There are no additional masses, suspicious calcifications or secondary signs of malignancy within either breast.  Specifically, there are no mammographic abnormalities within the outer breasts bilaterally corresponding to the additional areas of clinical concern. Mammographic images were processed with CAD. LEFT breast: Targeted ultrasound is performed, showing an irregular hypoechoic mass in the left breast at the 5:30 o'clock axis, 4 cm from the nipple, measuring 2.5 x 1.6 x 2 cm, corresponding to the mammographic finding and corresponding to 1 of the sites of clinical concern. Targeted ultrasound also performed of the outer left breast corresponding to the additional area of clinical concern, showing only normal fibroglandular tissues and fat lobules throughout. RIGHT breast: Targeted ultrasound is performed, evaluating the outer right breast corresponding to the area of clinical concern, showing only normal fibroglandular tissues and fat lobules. No suspicious solid or cystic mass is identified. LEFT axilla was evaluated with ultrasound showing a single mildly prominent lymph node, measuring 1.3 cm greatest dimension, with normal cortical thickness of 3.5 mm but with questionable effacement of the  fatty hilum. IMPRESSION: 1. Highly suspicious mass within the LEFT breast at the 5:30 o'clock axis, 4 cm from the nipple, measuring 2.5 cm, corresponding to the mammographic finding and corresponding to 1 of the palpable lumps in the left breast. Ultrasound-guided biopsy is recommended. 2. Mildly prominent lymph node in the LEFT axilla, with normal cortical thickness but with questionable effacement of the fatty hilum. Ultrasound-guided core biopsy is recommended. 3. No evidence of malignancy within the RIGHT breast. Also, no evidence of malignancy within the second area of clinical concern in the upper outer left breast. RECOMMENDATION: 1. Ultrasound-guided biopsy of the left breast mass at the 5:30 o'clock axis. 2. Ultrasound-guided biopsy of the single mildly prominent lymph node in the left axilla. Ultrasound-guided biopsies are scheduled for August 22nd. I have discussed the findings and recommendations with the patient. Results were also provided in writing at the conclusion of the visit. If applicable, a reminder letter will be sent to the patient regarding the next appointment. BI-RADS CATEGORY  5: Highly suggestive of malignancy. Electronically Signed   By: Franki Cabot M.D.   On: 04/27/2017 16:40   Mm Diag Breast Tomo Bilateral  Result Date: 04/27/2017 CLINICAL DATA:  Patient describes palpable masses within each breast (1 on the right and 2 on the left). EXAM: 2D DIGITAL DIAGNOSTIC BILATERAL MAMMOGRAM WITH CAD AND ADJUNCT TOMO ULTRASOUND BILATERAL BREAST COMPARISON:  Previous exam(s). ACR Breast Density Category c: The breast tissue is heterogeneously dense, which may obscure small masses. FINDINGS: There is an irregular mass within the inferior left breast, at posterior depth, measuring approximately 2 cm greatest dimension. There are no additional masses, suspicious calcifications or secondary signs of malignancy within either breast. Specifically, there are no mammographic abnormalities within the  outer breasts bilaterally corresponding to the additional areas of clinical concern. Mammographic images were processed with CAD. LEFT breast: Targeted ultrasound is performed, showing an irregular hypoechoic mass in the left breast at the 5:30 o'clock axis, 4 cm from the nipple, measuring 2.5 x 1.6 x 2 cm, corresponding to the mammographic finding and corresponding to 1 of the sites of clinical concern. Targeted ultrasound also performed of the outer left breast corresponding to the additional area of clinical concern, showing only normal fibroglandular tissues and fat lobules throughout. RIGHT breast: Targeted ultrasound is performed, evaluating the outer right breast corresponding to the area of clinical concern, showing only normal fibroglandular tissues and fat lobules. No suspicious solid or cystic mass is identified. LEFT axilla was evaluated with ultrasound showing a single mildly prominent lymph  node, measuring 1.3 cm greatest dimension, with normal cortical thickness of 3.5 mm but with questionable effacement of the fatty hilum. IMPRESSION: 1. Highly suspicious mass within the LEFT breast at the 5:30 o'clock axis, 4 cm from the nipple, measuring 2.5 cm, corresponding to the mammographic finding and corresponding to 1 of the palpable lumps in the left breast. Ultrasound-guided biopsy is recommended. 2. Mildly prominent lymph node in the LEFT axilla, with normal cortical thickness but with questionable effacement of the fatty hilum. Ultrasound-guided core biopsy is recommended. 3. No evidence of malignancy within the RIGHT breast. Also, no evidence of malignancy within the second area of clinical concern in the upper outer left breast. RECOMMENDATION: 1. Ultrasound-guided biopsy of the left breast mass at the 5:30 o'clock axis. 2. Ultrasound-guided biopsy of the single mildly prominent lymph node in the left axilla. Ultrasound-guided biopsies are scheduled for August 22nd. I have discussed the findings and  recommendations with the patient. Results were also provided in writing at the conclusion of the visit. If applicable, a reminder letter will be sent to the patient regarding the next appointment. BI-RADS CATEGORY  5: Highly suggestive of malignancy. Electronically Signed   By: Franki Cabot M.D.   On: 04/27/2017 16:40   [place] Axillary Node Core Biopsy Left  Addendum Date: 05/01/2017   ADDENDUM REPORT: 04/30/2017 14:44 ADDENDUM: Pathology revealed GRADE II-III INVASIVE DUCTAL CARCINOMA, DUCTAL CARCINOMA IN SITU of the Left breast, 5:30 o'clock. THERE IN NO EVIDENCE OF CARCINOMA of the Left axillary lymph node. This was found to be concordant by Dr. Lajean Manes. Pathology results were discussed with the patient by telephone. The patient reported doing well after the biopsies with tenderness at the sites. Post biopsy instructions and care were reviewed and questions were answered. The patient was encouraged to call [facility] for any additional concerns. The patient was referred to [facility] on May 06, 2017. Pathology results reported by Terie Purser, RN on 04/30/2017. Electronically Signed   By: Lajean Manes M.D.   On: 04/30/2017 14:44   Result Date: 05/01/2017 CLINICAL DATA:  Patient presents for ultrasound-guided core needle biopsy of a 5:30 o'clock position left breast mass and a borderline abnormal left axillary lymph node. EXAM: ULTRASOUND GUIDED LEFT BREAST CORE NEEDLE BIOPSY ULTRASOUND GUIDED LEFT AXILLA CORE NEEDLE BIOPSY COMPARISON:  Previous exam(s). FINDINGS: I met with the patient and we discussed the procedure of ultrasound-guided biopsy, including benefits and alternatives. We discussed the high likelihood of a successful procedure. We discussed the risks of the procedure, including infection, bleeding, tissue injury, clip migration, and inadequate sampling. Informed written consent was  given. The usual time-out protocol was performed immediately prior to the procedure. Lesion quadrant: Lower outer quadrant Using sterile technique and 1% Lidocaine as local anesthetic, under direct ultrasound visualization, a 12 gauge spring-loaded device was used to perform biopsy of 5:30 o'clock position left breast mass using a medial approach. At the conclusion of the procedure a ribbon shaped tissue marker clip was deployed into the biopsy cavity. Using sterile technique and 1% Lidocaine as local anesthetic, under direct ultrasound visualization, a 14 gauge spring-loaded device was used to perform biopsy of the borderline abnormal left axillary lymph node using an inferior approach. At the conclusion of the procedure a HydroMARK tissue marker clip was deployed into the biopsy cavity. Follow up 2 view mammogram was performed and dictated separately. IMPRESSION: Ultrasound guided biopsy of a left  breast mass and a borderline abnormal left axillary lymph node. No apparent complications. Electronically Signed: By: Lajean Manes M.D. On: 04/29/2017 10:24   Mm Clip Placement Left  Result Date: 05/22/2017 CLINICAL DATA:  Patient status post MRI guided core needle biopsy left breast non mass enhancement. EXAM: DIAGNOSTIC LEFT MAMMOGRAM POST MRI BIOPSY COMPARISON:  Previous exam(s). FINDINGS: Mammographic images were obtained following MRI guided biopsy of left breast non mass enhancement. Dumbbell-shaped marking clip within the lower inner left breast, in appropriate position. Ribbon shaped marking clip at the site of prior ultrasound-guided core needle biopsy. IMPRESSION: Appropriate position dumbbell-shaped marking clip lower inner left breast. Final Assessment: Post Procedure Mammograms for Marker Placement Electronically Signed   By: Lovey Newcomer M.D.   On: 05/22/2017 09:15   Mm Clip Placement Left  Result Date: 04/29/2017 CLINICAL DATA:  Status post ultrasound-guided core needle biopsy of a left breast mass  and a borderline abnormal left axillary lymph node. EXAM: DIAGNOSTIC LEFT MAMMOGRAM POST ULTRASOUND BIOPSY COMPARISON:  Previous exam(s). FINDINGS: Mammographic images were obtained following ultrasound guided biopsy of left breast mass and a left axillary lymph node. The ribbon shaped biopsy clip lies within the center of the left breast mass. The [facility] clip is seen on the MLO view either within or adjacent to the borderline abnormal lymph node. IMPRESSION: Well-positioned ribbon shaped biopsy clip within the left breast mass. HydroMARK biopsy clip lies within or adjacent to the left axillary lymph node. Final Assessment: Post Procedure Mammograms for Marker Placement Electronically Signed   By: Lajean Manes M.D.   On: 04/29/2017 10:43   [place] Lt Breast Bx W Loc Dev 1st Lesion Img Bx Spec US Guide  Addendum Date: 05/01/2017   ADDENDUM REPORT: 04/30/2017 14:44 ADDENDUM: Pathology revealed GRADE II-III INVASIVE DUCTAL CARCINOMA, DUCTAL CARCINOMA IN SITU of the Left breast, 5:30 o'clock. THERE IN NO EVIDENCE OF CARCINOMA of the Left axillary lymph node. This was found to be concordant by Dr. Lajean Manes. Pathology results were discussed with the patient by telephone. The patient reported doing well after the biopsies with tenderness at the sites. Post biopsy instructions and care were reviewed and questions were answered. The patient was encouraged to call [facility] for any additional concerns. The patient was referred to [facility] on May 06, 2017. Pathology results reported by Terie Purser, RN on 04/30/2017. Electronically Signed   By: Lajean Manes M.D.   On: 04/30/2017 14:44   Result Date: 05/01/2017 CLINICAL DATA:  Patient presents for ultrasound-guided core needle biopsy of a 5:30 o'clock position left breast mass and a borderline abnormal left axillary lymph node. EXAM: ULTRASOUND GUIDED LEFT  BREAST CORE NEEDLE BIOPSY ULTRASOUND GUIDED LEFT AXILLA CORE NEEDLE BIOPSY COMPARISON:  Previous exam(s). FINDINGS: I met with the patient and we discussed the procedure of ultrasound-guided biopsy, including benefits and alternatives. We discussed the high likelihood of a successful procedure. We discussed the risks of the procedure, including infection, bleeding, tissue injury, clip migration, and inadequate sampling. Informed written consent was given. The usual time-out protocol was performed immediately prior to the procedure. Lesion quadrant: Lower outer quadrant Using sterile technique and 1% Lidocaine as local anesthetic, under direct ultrasound visualization, a 12 gauge spring-loaded device was used to perform biopsy of 5:30 o'clock position left breast mass using a medial approach. At the conclusion of the procedure a ribbon shaped tissue marker clip was deployed into the biopsy  cavity. Using sterile technique and 1% Lidocaine as local anesthetic, under direct ultrasound visualization, a 14 gauge spring-loaded device was used to perform biopsy of the borderline abnormal left axillary lymph node using an inferior approach. At the conclusion of the procedure a HydroMARK tissue marker clip was deployed into the biopsy cavity. Follow up 2 view mammogram was performed and dictated separately. IMPRESSION: Ultrasound guided biopsy of a left breast mass and a borderline abnormal left axillary lymph node. No apparent complications. Electronically Signed: By: Lajean Manes M.D. On: 04/29/2017 10:24   Mr Briana Price Breast Bx Johnella Moloney Dev 1st Lesion Image Bx Spec Mr Guide  Result Date: 05/22/2017 CLINICAL DATA:  Patient with indeterminate non mass enhancement anterior to the known left breast malignancy on prior MRI. For MRI guided core needle biopsy. EXAM: MRI GUIDED CORE NEEDLE BIOPSY OF THE LEFT BREAST TECHNIQUE: Multiplanar, multisequence MR imaging of the left breast was performed both before and after administration of  intravenous contrast. CONTRAST:  77m MULTIHANCE GADOBENATE DIMEGLUMINE 529 MG/ML IV SOLN COMPARISON:  Previous exams. FINDINGS: I met with the patient, and we discussed the procedure of MRI guided biopsy, including risks, benefits, and alternatives. Specifically, we discussed the risks of infection, bleeding, tissue injury, clip migration, and inadequate sampling. Informed, written consent was given. The usual time out protocol was performed immediately prior to the procedure. Using sterile technique, 1% Lidocaine, MRI guidance, and a 9 gauge vacuum assisted device, biopsy was performed of non mass enhancement within the lower inner left breast using a lateral approach. At the conclusion of the procedure, a dumbbell-shaped tissue marker clip was deployed into the biopsy cavity. Follow-up 2-view mammogram was performed and dictated separately. IMPRESSION: MRI guided biopsy of non mass enhancement lower inner left breast. No apparent complications. Electronically Signed   By: DLovey NewcomerM.D.   On: 05/22/2017 09:20   ASSESSMENT & PLAN:  TMAHEEN CWIKLAis a 51y.o. african-american female with a history of HTN and arthritis in her back, presented with a palpable left breast mass.  1. 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  -We discussed her mammogram, ultrasound findings, and initial biopsy results with patient and her family members in details.  -She presented with a palpable left breast mass, measures 2.5 cm on ultrasound, with a prominent lymph node. 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 -She is a candidate for lumpectomy and sentinel lymph node biopsy. She was seen by surgeon Dr. BBarry Dienestoday. Due to her young age and triple negative disease, we recommended her to see genetics, to ruled out inheritable genetic  syndrome. -I reviewed her breast MRI findings, which showed additional non-mass enhancement in the left breast, which is anterior to the biopsy proven breast cancer, in the lower inner quadrant. I recommend her to have additional biopsy, she agrees, we'll schedule for this week. -I reviewed her CT scan and bone scan findings in great detail, no evidence of distant metastasis. -both MRI and a CT scan showed enlarged left axillary lymph node, but biopsy was negative for malignancy, although I still have some concern that she may have metastatic adenopathy. -She agreed to proceed with neoadjuvant chemotherapy. She has concerns about the secondary malignancy from Adriamycin. However given the extensive of her disease, especially if the second biopsy is positive for invasive disease, I would strongly encourage her to take  Adriamycin and Cytoxan, followed by Taxol with or without carboplatin. -I did call her oncologist at [facility] Dr. Juanita Craver, but she has not called me back. I did speak my partner Dr. Jana Hakim who recommends AC-T also.  -Chemotherapy consent: Side effects including but does not not limited to, fatigue, nausea, vomiting, diarrhea, hair loss, neuropathy, fluid retention, renal and kidney dysfunction, neutropenic fever, needed for blood transfusion, bleeding, heart failure, increased risk of leukemia and secondary malignancy, were discussed with patient in great detail. She agrees to proceed. -the goa of chemo is curative  -I explained how to take Zofran and compazine after treatments. She understands.  -Labs are overall normal, she is slightly anemic, Hg at 11.2. Otherwise she is adequate to  proceed with cycle 1 AC with onpro patch  -I strongly recommend her to get the flu shot this year and when she is at her son's football games she can use a mask if her counts are low. We will provide her some masks -I suggest she stay hydrated and eats well and foods that are easy on her stomach.  -F/u on 9/28    -She knows if she has sever side effects of diarrhea or dehydration she will call us.    2. Genetics -Based on her significant familial breast cancer history, her young age and triple negative disease,  I suggest she gets genetic testing for possible gene mutations.  -Apt with Roma Kayser in genetics on 05/28/17  3. HTN -she is on HCTZ and lisinopril , we'll continue, she will follow-up with her primary care physician  -We discussed the impact of chemotherapy, blood pressure, and a possibility that we may hold her blood pressure medication if she gets dehydrated.   4. Rosacea -Dermatologist prescribed topical steroid -OK to use during chemotherapy; she will also receive IV steroid which may help control  PLAN:  -lab reviewed, will proceed first cycle AC today with Onpro  Provided letter for pt's job so she can wear mask to lower risk of infection. -Lab, flush, f/u and AC in 2, 4 and 6 weeks  -Will call pt with biopsy results    No orders of the defined types were placed in this encounter.  All questions were answered. The patient knows to call the clinic with any problems, questions or concerns. I spent 30 minutes counseling the patient face to face. The total time spent in the appointment was 40 minutes and more than 50% was on counseling.  This document serves as a record of services personally performed by [name], MD. It was created on her behalf by Joslyn Devon, a trained medical scribe. The creation of this record is based on the scribe's personal observations and the provider's statements to them. This document has been checked and approved by the attending provider.      [name], MD 05/22/2017

## 2017-05-22 ENCOUNTER — Other Ambulatory Visit (HOSPITAL_BASED_OUTPATIENT_CLINIC_OR_DEPARTMENT_OTHER): Payer: Federal, State, Local not specified - PPO

## 2017-05-22 ENCOUNTER — Ambulatory Visit: Payer: Federal, State, Local not specified - PPO

## 2017-05-22 ENCOUNTER — Encounter: Payer: Self-pay | Admitting: Hematology

## 2017-05-22 ENCOUNTER — Encounter: Payer: Self-pay | Admitting: *Deleted

## 2017-05-22 ENCOUNTER — Ambulatory Visit (HOSPITAL_BASED_OUTPATIENT_CLINIC_OR_DEPARTMENT_OTHER): Payer: Federal, State, Local not specified - PPO | Admitting: Hematology

## 2017-05-22 ENCOUNTER — Ambulatory Visit (HOSPITAL_BASED_OUTPATIENT_CLINIC_OR_DEPARTMENT_OTHER): Payer: Federal, State, Local not specified - PPO

## 2017-05-22 ENCOUNTER — Telehealth: Payer: Self-pay | Admitting: Hematology

## 2017-05-22 ENCOUNTER — Ambulatory Visit
Admission: RE | Admit: 2017-05-22 | Discharge: 2017-05-22 | Disposition: A | Discharge status: Home / Self Care | Payer: Federal, State, Local not specified - PPO | Source: Ambulatory Visit | Attending: Hematology | Admitting: Hematology

## 2017-05-22 VITALS — BP 130/86 | HR 70 | Temp 98.5°F | Resp 17 | Ht 66.0 in | Wt 217.5 lb

## 2017-05-22 VITALS — BP 142/89 | HR 74 | Temp 99.4°F | Resp 18

## 2017-05-22 DIAGNOSIS — D0512 Intraductal carcinoma in situ of left breast: Secondary | ICD-10-CM | POA: Diagnosis not present

## 2017-05-22 DIAGNOSIS — Z5189 Encounter for other specified aftercare: Secondary | ICD-10-CM | POA: Diagnosis not present

## 2017-05-22 DIAGNOSIS — Z171 Estrogen receptor negative status [ER-]: Principal | ICD-10-CM

## 2017-05-22 DIAGNOSIS — I10 Essential (primary) hypertension: Secondary | ICD-10-CM | POA: Diagnosis not present

## 2017-05-22 DIAGNOSIS — N6489 Other specified disorders of breast: Secondary | ICD-10-CM | POA: Diagnosis not present

## 2017-05-22 DIAGNOSIS — C50512 Malignant neoplasm of lower-outer quadrant of left female breast: Secondary | ICD-10-CM

## 2017-05-22 DIAGNOSIS — Z95828 Presence of other vascular implants and grafts: Secondary | ICD-10-CM

## 2017-05-22 DIAGNOSIS — C773 Secondary and unspecified malignant neoplasm of axilla and upper limb lymph nodes: Secondary | ICD-10-CM

## 2017-05-22 DIAGNOSIS — Z5111 Encounter for antineoplastic chemotherapy: Secondary | ICD-10-CM | POA: Diagnosis not present

## 2017-05-22 LAB — COMPREHENSIVE METABOLIC PANEL
ALT: 12 U/L (ref 0–55)
ANION GAP: 8 meq/L (ref 3–11)
AST: 15 U/L (ref 5–34)
Albumin: 3.6 g/dL (ref 3.5–5.0)
Alkaline Phosphatase: 58 U/L (ref 40–150)
BUN: 11 mg/dL (ref 7.0–26.0)
CHLORIDE: 102 meq/L (ref 98–109)
CO2: 27 mEq/L (ref 22–29)
Calcium: 9.4 mg/dL (ref 8.4–10.4)
Creatinine: 0.7 mg/dL (ref 0.6–1.1)
EGFR: >90 mL/min/{1.73_m2} (ref >90)
GLUCOSE: 91 mg/dL (ref 70–140)
POTASSIUM: 3.6 meq/L (ref 3.5–5.1)
SODIUM: 137 meq/L (ref 136–145)
TOTAL PROTEIN: 7.2 g/dL (ref 6.4–8.3)
Total Bilirubin: 0.62 mg/dL (ref 0.20–1.20)

## 2017-05-22 LAB — CBC WITH DIFFERENTIAL/PLATELET
BASO%: 0.4 % (ref 0.0–2.0)
BASOS ABS: 0 10*3/uL (ref 0.0–0.1)
EOS ABS: 0 10*3/uL (ref 0.0–0.5)
EOS%: 0.4 % (ref 0.0–7.0)
HCT: 34.8 % (ref 34.8–46.6)
HGB: 11.2 g/dL — ABNORMAL LOW (ref 11.6–15.9)
LYMPH%: 24.7 % (ref 14.0–49.7)
MCH: 26.4 pg (ref 25.1–34.0)
MCHC: 32.2 g/dL (ref 31.5–36.0)
MCV: 82.1 fL (ref 79.5–101.0)
MONO#: 0.9 10*3/uL (ref 0.1–0.9)
MONO%: 9.2 % (ref 0.0–14.0)
NEUT#: 6.3 10*3/uL (ref 1.5–6.5)
NEUT%: 65.3 % (ref 38.4–76.8)
PLATELETS: 377 10*3/uL (ref 145–400)
RBC: 4.24 10*6/uL (ref 3.70–5.45)
RDW: 13.1 % (ref 11.2–14.5)
WBC: 9.7 10*3/uL (ref 3.9–10.3)
lymph#: 2.4 10*3/uL (ref 0.9–3.3)

## 2017-05-22 MED ORDER — SODIUM CHLORIDE 0.9 % IV SOLN
600.0000 mg/m2 | Freq: Once | INTRAVENOUS | Status: AC
  Administered 2017-05-22: 1300 mg via INTRAVENOUS
  Filled 2017-05-22: qty 65

## 2017-05-22 MED ORDER — PEGFILGRASTIM 6 MG/0.6ML ~~LOC~~ PSKT
6.0000 mg | PREFILLED_SYRINGE | Freq: Once | SUBCUTANEOUS | Status: AC
  Administered 2017-05-22: 6 mg via SUBCUTANEOUS
  Filled 2017-05-22: qty 0.6

## 2017-05-22 MED ORDER — SODIUM CHLORIDE 0.9 % IV SOLN
Freq: Once | INTRAVENOUS | Status: AC
  Administered 2017-05-22: 15:00:00 via INTRAVENOUS

## 2017-05-22 MED ORDER — SODIUM CHLORIDE 0.9% FLUSH
10.0000 mL | INTRAVENOUS | Status: DC | PRN
Start: 2017-05-22 — End: 2017-05-22
  Administered 2017-05-22: 10 mL
  Filled 2017-05-22: qty 10

## 2017-05-22 MED ORDER — DOXORUBICIN HCL CHEMO IV INJECTION 2 MG/ML
60.0000 mg/m2 | Freq: Once | INTRAVENOUS | Status: AC
  Administered 2017-05-22: 130 mg via INTRAVENOUS
  Filled 2017-05-22: qty 65

## 2017-05-22 MED ORDER — SODIUM CHLORIDE 0.9% FLUSH
10.0000 mL | INTRAVENOUS | Status: DC | PRN
  Administered 2017-05-22: 10 mL via INTRAVENOUS
  Filled 2017-05-22: qty 10

## 2017-05-22 MED ORDER — PALONOSETRON HCL INJECTION 0.25 MG/5ML
INTRAVENOUS | Status: AC
  Filled 2017-05-22: qty 5

## 2017-05-22 MED ORDER — GADOBENATE DIMEGLUMINE 529 MG/ML IV SOLN
20.0000 mL | Freq: Once | INTRAVENOUS | Status: AC | PRN
  Administered 2017-05-22: 20 mL via INTRAVENOUS

## 2017-05-22 MED ORDER — SODIUM CHLORIDE 0.9 % IV SOLN
Freq: Once | INTRAVENOUS | Status: AC
  Administered 2017-05-22: 16:00:00 via INTRAVENOUS
  Filled 2017-05-22: qty 5

## 2017-05-22 MED ORDER — PALONOSETRON HCL INJECTION 0.25 MG/5ML
0.2500 mg | Freq: Once | INTRAVENOUS | Status: AC
  Administered 2017-05-22: 0.25 mg via INTRAVENOUS

## 2017-05-22 MED ORDER — HEPARIN SOD (PORK) LOCK FLUSH 100 UNIT/ML IV SOLN
500.0000 [IU] | Freq: Once | INTRAVENOUS | Status: AC | PRN
  Administered 2017-05-22: 500 [IU]
  Filled 2017-05-22: qty 5

## 2017-05-22 NOTE — Patient Instructions (Signed)
[facility] Discharge Instructions for Patients Receiving Chemotherapy  Today you received the following chemotherapy agents: Adriamycin and Cytoxan   To help prevent nausea and vomiting after your treatment, we encourage you to take your nausea medication as directed.    If you develop nausea and vomiting that is not controlled by your nausea medication, call the clinic.   BELOW ARE SYMPTOMS THAT SHOULD BE REPORTED IMMEDIATELY:  *FEVER GREATER THAN 100.5 F  *CHILLS WITH OR WITHOUT FEVER  NAUSEA AND VOMITING THAT IS NOT CONTROLLED WITH YOUR NAUSEA MEDICATION  *UNUSUAL SHORTNESS OF BREATH  *UNUSUAL BRUISING OR BLEEDING  TENDERNESS IN MOUTH AND THROAT WITH OR WITHOUT PRESENCE OF ULCERS  *URINARY PROBLEMS  *BOWEL PROBLEMS  UNUSUAL RASH Items with * indicate a potential emergency and should be followed up as soon as possible.  Feel free to call the clinic you have any questions or concerns. The clinic phone number is [phone].  Please show the [facility] at check-in to the Emergency Department and triage nurse.    Doxorubicin injection What is this medicine? DOXORUBICIN (dox oh ROO bi sin) is a chemotherapy drug. It is used to treat many kinds of cancer like leukemia, lymphoma, neuroblastoma, sarcoma, and Wilms' tumor. It is also used to treat bladder cancer, breast cancer, lung cancer, ovarian cancer, stomach cancer, and thyroid cancer. This medicine may be used for other purposes; ask your health care provider or pharmacist if you have questions. COMMON BRAND NAME(S): Adriamycin, Adriamycin PFS, Adriamycin RDF, Rubex What should I tell my health care provider before I take this medicine? They need to know if you have any of these conditions: -heart disease -history of low blood counts caused by a medicine -liver disease -recent or ongoing radiation therapy -an unusual or allergic reaction to doxorubicin, other chemotherapy agents, other  medicines, foods, dyes, or preservatives -pregnant or trying to get pregnant -breast-feeding How should I use this medicine? This drug is given as an infusion into a vein. It is administered in a hospital or clinic by a specially trained health care professional. If you have pain, swelling, burning or any unusual feeling around the site of your injection, tell your health care professional right away. Talk to your pediatrician regarding the use of this medicine in children. Special care may be needed. Overdosage: If you think you have taken too much of this medicine contact a poison control center or emergency room at once. NOTE: This medicine is only for you. Do not share this medicine with others. What if I miss a dose? It is important not to miss your dose. Call your doctor or health care professional if you are unable to keep an appointment. What may interact with this medicine? This medicine may interact with the following medications: -6-mercaptopurine -paclitaxel -phenytoin -St. John's Wort -trastuzumab -verapamil This list may not describe all possible interactions. Give your health care provider a list of all the medicines, herbs, non-prescription drugs, or dietary supplements you use. Also tell them if you smoke, drink alcohol, or use illegal drugs. Some items may interact with your medicine. What should I watch for while using this medicine? This drug may make you feel generally unwell. This is not uncommon, as chemotherapy can affect healthy cells as well as cancer cells. Report any side effects. Continue your course of treatment even though you feel ill unless your doctor tells you to stop. There is a maximum amount of this medicine you should receive throughout your life. The  amount depends on the medical condition being treated and your overall health. Your doctor will watch how much of this medicine you receive in your lifetime. Tell your doctor if you have taken this medicine  before. You may need blood work done while you are taking this medicine. Your urine may turn red for a few days after your dose. This is not blood. If your urine is dark or brown, call your doctor. In some cases, you may be given additional medicines to help with side effects. Follow all directions for their use. Call your doctor or health care professional for advice if you get a fever, chills or sore throat, or other symptoms of a cold or flu. Do not treat yourself. This drug decreases your body's ability to fight infections. Try to avoid being around people who are sick. This medicine may increase your risk to bruise or bleed. Call your doctor or health care professional if you notice any unusual bleeding. Talk to your doctor about your risk of cancer. You may be more at risk for certain types of cancers if you take this medicine. Do not become pregnant while taking this medicine or for 6 months after stopping it. Women should inform their doctor if they wish to become pregnant or think they might be pregnant. Men should not father a child while taking this medicine and for 6 months after stopping it. There is a potential for serious side effects to an unborn child. Talk to your health care professional or pharmacist for more information. Do not breast-feed an infant while taking this medicine. This medicine has caused ovarian failure in some women and reduced sperm counts in some men This medicine may interfere with the ability to have a child. Talk with your doctor or health care professional if you are concerned about your fertility. What side effects may I notice from receiving this medicine? Side effects that you should report to your doctor or health care professional as soon as possible: -allergic reactions like skin rash, itching or hives, swelling of the face, lips, or tongue -breathing problems -chest pain -fast or irregular heartbeat -low blood counts - this medicine may decrease the  number of white blood cells, red blood cells and platelets. You may be at increased risk for infections and bleeding. -pain, redness, or irritation at site where injected -signs of infection - fever or chills, cough, sore throat, pain or difficulty passing urine -signs of decreased platelets or bleeding - bruising, pinpoint red spots on the skin, black, tarry stools, blood in the urine -swelling of the ankles, feet, hands -tiredness -weakness Side effects that usually do not require medical attention (report to your doctor or health care professional if they continue or are bothersome): -diarrhea -hair loss -mouth sores -nail discoloration or damage -nausea -red colored urine -vomiting This list may not describe all possible side effects. Call your doctor for medical advice about side effects. You may report side effects to FDA at 1-800-FDA-1088. Where should I keep my medicine? This drug is given in a hospital or clinic and will not be stored at home. NOTE: This sheet is a summary. It may not cover all possible information. If you have questions about this medicine, talk to your doctor, pharmacist, or health care provider.  2018 Elsevier/Gold Standard (2015-10-22 11:28:51)     Cyclophosphamide injection What is this medicine? CYCLOPHOSPHAMIDE (sye kloe FOSS fa mide) is a chemotherapy drug. It slows the growth of cancer cells. This medicine is used to treat  many types of cancer like lymphoma, myeloma, leukemia, breast cancer, and ovarian cancer, to name a few. This medicine may be used for other purposes; ask your health care provider or pharmacist if you have questions. COMMON BRAND NAME(S): Cytoxan, Neosar What should I tell my health care provider before I take this medicine? They need to know if you have any of these conditions: -blood disorders -history of other chemotherapy -infection -kidney disease -liver disease -recent or ongoing radiation therapy -tumors in the bone  marrow -an unusual or allergic reaction to cyclophosphamide, other chemotherapy, other medicines, foods, dyes, or preservatives -pregnant or trying to get pregnant -breast-feeding How should I use this medicine? This drug is usually given as an injection into a vein or muscle or by infusion into a vein. It is administered in a hospital or clinic by a specially trained health care professional. Talk to your pediatrician regarding the use of this medicine in children. Special care may be needed. Overdosage: If you think you have taken too much of this medicine contact a poison control center or emergency room at once. NOTE: This medicine is only for you. Do not share this medicine with others. What if I miss a dose? It is important not to miss your dose. Call your doctor or health care professional if you are unable to keep an appointment. What may interact with this medicine? This medicine may interact with the following medications: -amiodarone -amphotericin B -azathioprine -certain antiviral medicines for HIV or AIDS such as protease inhibitors (e.g., indinavir, ritonavir) and zidovudine -certain blood pressure medications such as benazepril, captopril, enalapril, fosinopril, lisinopril, moexipril, monopril, perindopril, quinapril, ramipril, trandolapril -certain cancer medications such as anthracyclines (e.g., daunorubicin, doxorubicin), busulfan, cytarabine, paclitaxel, pentostatin, tamoxifen, trastuzumab -certain diuretics such as chlorothiazide, chlorthalidone, hydrochlorothiazide, indapamide, metolazone -certain medicines that treat or prevent blood clots like warfarin -certain muscle relaxants such as succinylcholine -cyclosporine -etanercept -indomethacin -medicines to increase blood counts like filgrastim, pegfilgrastim, sargramostim -medicines used as general anesthesia -metronidazole -natalizumab This list may not describe all possible interactions. Give your health care  provider a list of all the medicines, herbs, non-prescription drugs, or dietary supplements you use. Also tell them if you smoke, drink alcohol, or use illegal drugs. Some items may interact with your medicine. What should I watch for while using this medicine? Visit your doctor for checks on your progress. This drug may make you feel generally unwell. This is not uncommon, as chemotherapy can affect healthy cells as well as cancer cells. Report any side effects. Continue your course of treatment even though you feel ill unless your doctor tells you to stop. Drink water or other fluids as directed. Urinate often, even at night. In some cases, you may be given additional medicines to help with side effects. Follow all directions for their use. Call your doctor or health care professional for advice if you get a fever, chills or sore throat, or other symptoms of a cold or flu. Do not treat yourself. This drug decreases your body's ability to fight infections. Try to avoid being around people who are sick. This medicine may increase your risk to bruise or bleed. Call your doctor or health care professional if you notice any unusual bleeding. Be careful brushing and flossing your teeth or using a toothpick because you may get an infection or bleed more easily. If you have any dental work done, tell your dentist you are receiving this medicine. You may get drowsy or dizzy. Do not drive, use machinery, or  do anything that needs mental alertness until you know how this medicine affects you. Do not become pregnant while taking this medicine or for 1 year after stopping it. Women should inform their doctor if they wish to become pregnant or think they might be pregnant. Men should not father a child while taking this medicine and for 4 months after stopping it. There is a potential for serious side effects to an unborn child. Talk to your health care professional or pharmacist for more information. Do not  breast-feed an infant while taking this medicine. This medicine may interfere with the ability to have a child. This medicine has caused ovarian failure in some women. This medicine has caused reduced sperm counts in some men. You should talk with your doctor or health care professional if you are concerned about your fertility. If you are going to have surgery, tell your doctor or health care professional that you have taken this medicine. What side effects may I notice from receiving this medicine? Side effects that you should report to your doctor or health care professional as soon as possible: -allergic reactions like skin rash, itching or hives, swelling of the face, lips, or tongue -low blood counts - this medicine may decrease the number of white blood cells, red blood cells and platelets. You may be at increased risk for infections and bleeding. -signs of infection - fever or chills, cough, sore throat, pain or difficulty passing urine -signs of decreased platelets or bleeding - bruising, pinpoint red spots on the skin, black, tarry stools, blood in the urine -signs of decreased red blood cells - unusually weak or tired, fainting spells, lightheadedness -breathing problems -dark urine -dizziness -palpitations -swelling of the ankles, feet, hands -trouble passing urine or change in the amount of urine -weight gain -yellowing of the eyes or skin Side effects that usually do not require medical attention (report to your doctor or health care professional if they continue or are bothersome): -changes in nail or skin color -hair loss -missed menstrual periods -mouth sores -nausea, vomiting This list may not describe all possible side effects. Call your doctor for medical advice about side effects. You may report side effects to FDA at 1-800-FDA-1088. Where should I keep my medicine? This drug is given in a hospital or clinic and will not be stored at home. NOTE: This sheet is a  summary. It may not cover all possible information. If you have questions about this medicine, talk to your doctor, pharmacist, or health care provider.  2018 Elsevier/Gold Standard (2012-07-09 16:22:58)    Palonosetron Injection What is this medicine? PALONOSETRON (pal oh NOE se tron) is used to prevent nausea and vomiting caused by chemotherapy. It also helps prevent delayed nausea and vomiting that may occur a few days after your treatment. This medicine may be used for other purposes; ask your health care provider or pharmacist if you have questions. COMMON BRAND NAME(S): Aloxi What should I tell my health care provider before I take this medicine? They need to know if you have any of these conditions: -an unusual or allergic reaction to palonosetron, dolasetron, granisetron, ondansetron, other medicines, foods, dyes, or preservatives -pregnant or trying to get pregnant -breast-feeding How should I use this medicine? This medicine is for infusion into a vein. It is given by a health care professional in a hospital or clinic setting. Talk to your pediatrician regarding the use of this medicine in children. While this drug may be prescribed for children as young as  1 month for selected conditions, precautions do apply. Overdosage: If you think you have taken too much of this medicine contact a poison control center or emergency room at once. NOTE: This medicine is only for you. Do not share this medicine with others. What if I miss a dose? This does not apply. What may interact with this medicine? -certain medicines for depression, anxiety, or psychotic disturbances -fentanyl -linezolid -MAOIs like Carbex, Eldepryl, Marplan, Nardil, and Parnate -methylene blue (injected into a vein) -tramadol This list may not describe all possible interactions. Give your health care provider a list of all the medicines, herbs, non-prescription drugs, or dietary supplements you use. Also tell them if  you smoke, drink alcohol, or use illegal drugs. Some items may interact with your medicine. What should I watch for while using this medicine? Your condition will be monitored carefully while you are receiving this medicine. What side effects may I notice from receiving this medicine? Side effects that you should report to your doctor or health care professional as soon as possible: -allergic reactions like skin rash, itching or hives, swelling of the face, lips, or tongue -breathing problems -confusion -dizziness -fast, irregular heartbeat -fever and chills -loss of balance or coordination -seizures -sweating -swelling of the hands and feet -tremors -unusually weak or tired Side effects that usually do not require medical attention (report to your doctor or health care professional if they continue or are bothersome): -constipation or diarrhea -headache This list may not describe all possible side effects. Call your doctor for medical advice about side effects. You may report side effects to FDA at 1-800-FDA-1088. Where should I keep my medicine? This drug is given in a hospital or clinic and will not be stored at home. NOTE: This sheet is a summary. It may not cover all possible information. If you have questions about this medicine, talk to your doctor, pharmacist, or health care provider.  2018 Elsevier/Gold Standard (2013-07-01 10:38:36)

## 2017-05-22 NOTE — Patient Instructions (Signed)

## 2017-05-22 NOTE — Progress Notes (Signed)
Blood return noted before, every 3 cc and after Adriamycin push.  Pt Tolerated infusion well. Discharge instructions and Neulasta instructions printed and verbally reviewed. Pt and family verbalizes understanding. Pt and VS stable at discharge.

## 2017-05-22 NOTE — Telephone Encounter (Signed)
Gave avs and calendar for September and october °

## 2017-05-26 ENCOUNTER — Telehealth: Payer: Self-pay | Admitting: *Deleted

## 2017-05-26 NOTE — Telephone Encounter (Signed)
Called pt regarding chemo f/u after [facility] & On[facility].  She states that she had some nausea but took nausea med & has done better.  Her main concern is fatigue.  She went to work today but only stayed a couple of hours due to everybody hugging her for her birthday.  Discussed exercise, & diet.  Noted that hgb was a little low.  She states she is supposed to be taking iron but forgets sometimes.  Encouraged to cont iron.  She knows to call with any concerns.

## 2017-05-26 NOTE — Telephone Encounter (Signed)
-----   Message from Egbert Garibaldi, RN sent at 05/22/2017  5:48 PM EDT ----- Regarding: Burr Medico chemo f/u call  Pt of Dr. Burr Medico. First time Adriamycin and Cytoxan and on pro. Pt tolerated well.

## 2017-05-28 ENCOUNTER — Encounter: Payer: Self-pay | Admitting: Genetic Counselor

## 2017-05-28 ENCOUNTER — Ambulatory Visit (HOSPITAL_BASED_OUTPATIENT_CLINIC_OR_DEPARTMENT_OTHER): Payer: Federal, State, Local not specified - PPO | Admitting: Genetic Counselor

## 2017-05-28 ENCOUNTER — Other Ambulatory Visit: Payer: Federal, State, Local not specified - PPO

## 2017-05-28 DIAGNOSIS — Z803 Family history of malignant neoplasm of breast: Secondary | ICD-10-CM

## 2017-05-28 DIAGNOSIS — Z7183 Encounter for nonprocreative genetic counseling: Secondary | ICD-10-CM

## 2017-05-28 DIAGNOSIS — C50512 Malignant neoplasm of lower-outer quadrant of left female breast: Secondary | ICD-10-CM

## 2017-05-28 DIAGNOSIS — Z1379 Encounter for other screening for genetic and chromosomal anomalies: Secondary | ICD-10-CM

## 2017-05-28 DIAGNOSIS — C50919 Malignant neoplasm of unspecified site of unspecified female breast: Secondary | ICD-10-CM | POA: Insufficient documentation

## 2017-05-28 HISTORY — DX: Encounter for other screening for genetic and chromosomal anomalies: Z13.79

## 2017-05-28 NOTE — Progress Notes (Signed)
[facility]      Initial Visit   Patient Name: Briana Price Patient DOB: 1965/09/24 Patient Age: 51 y.o. Encounter Date: 05/28/2017  Referring Provider: [name], MD  Primary Care Provider: [name], MD  Reason for Visit: Evaluate for hereditary susceptibility to cancer    Assessment and Plan:  . 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 Further genetic evaluation is warranted. 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   . Testing is recommended to determine whether she has a pathogenic mutation that will impact her breast surgery after neoadjuvant chemotherapy as well as her screening and risk-reduction for future cancers. A negative result will be reassuring.  . Briana Price wished to pursue genetic testing and a blood sample will be sent for analysis of the 46 genes on [facility]'s Common Cancers panel (APC, ATM, AXIN2, BARD1, BMPR1A, BRCA1, BRCA2, BRIP1, CDH1, CDKN2A, CHEK2, CTNNA1, DICER1, EPCAM, GREM1, HOXB13, KIT, MEN1, MLH1, MSH2, MSH3, MSH6, MUTYH, NBN, NF1, NTHL1, PALB2, PDGFRA, PMS2, POLD1, POLE, PTEN, RAD50, RAD51C, RAD51D, SDHA, SDHB, SDHC, SDHD, SMAD4, SMARCA4, STK11, TP53, TSC1, TSC2, VHL).   . Results should be available in approximately 2-4 weeks, at which point we will contact her and address implications for her as well as address genetic testing for at-risk family members, if needed.     Dr. Burr Medico was available for questions concerning this case. Total time spent by me in face-to-face counseling was approximately 35 minutes.   _____________________________________________________________________   History of Present Illness: Briana Price, a 51 y.o. female, is being seen at the [facility] due to a personal and family history of breast  cancer. She presents to clinic today to discuss the possibility of a hereditary predisposition to cancer and discuss whether genetic testing is warranted.   Oncology History   Cancer Staging Malignant neoplasm of lower-outer quadrant of left breast of female, estrogen receptor negative ([place]) Staging form: Breast, AJCC 8th Edition - Clinical: Stage IIB (cT2, cN0(f), cM0, G3, ER: Negative, PR: Negative, HER2: Negative) - Unsigned       Malignant neoplasm of lower-outer quadrant of left breast of female, estrogen receptor negative ([place])   04/27/2017 Mammogram    IMPRESSION: 1. Highly suspicious mass within the LEFT breast at the 5:30 o'clock axis, 4 cm from the nipple, measuring 2.5 cm, corresponding to the mammographic finding and corresponding to 1 of the palpable lumps in the left breast. Ultrasound-guided biopsy is recommended. 2. Mildly prominent lymph node in the LEFT axilla, with normal cortical thickness but with questionable effacement of the fatty hilum. Ultrasound-guided core biopsy is recommended. 3. No evidence of malignancy within the RIGHT breast. Also, no evidence of malignancy within the second area of clinical concern in the upper outer left breast.      04/29/2017 Initial Diagnosis    Malignant neoplasm of lower-outer quadrant of left breast of female, estrogen receptor negative ([place])      04/29/2017 Receptors her2    Estrogen Receptor: 0%, NEGATIVE Progesterone Receptor: 0%, NEGATIVE Proliferation Marker Ki67: 90% HER2 NEGATIVE       04/29/2017 Initial Biopsy     Diagnosis 1. 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 - SEE COMMENT. 2. Lymph node, needle/core biopsy, left  axilla - THERE IN NO EVIDENCE OF CARCINOMA IN 1 OF 1 LYMPH NODE (0/1).      05/14/2017 Imaging    MRI of Breast Bilateral 05/14/17  IMPRESSION: 1. 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 2. Non mass enhancement extending approximately 3.2 cm anterior to the mass in the lower inner quadrant of the left breast, suspicious for DCIS. There is no correlate on recent mammography. The overall extent of the mass and non mass enhancement is approximately 5 cm. 3. No MRI evidence of malignancy involving the right breast. 4. No pathologic lymphadenopathy. Upper normal sized left axillary lymph nodes, the largest of which was previously biopsied no evidence of metastatic disease.      05/15/2017 Imaging    Bone scan whole body 05/15/17 IMPRESSION: No findings specific for metastatic disease to bone.      05/15/2017 Imaging    CT CAP W contrast 05/15/17  IMPRESSION: 1. 18 mm soft tissue lesion inferior left breast compatible with known primary malignancy. 2. Mildly enlarged left axillary lymph nodes in this patient with biopsy-proven metastatic disease to the left axilla. 3. No evidence for lymphadenopathy elsewhere in the chest. No lymphadenopathy in the abdomen or pelvis. No evidence for metastatic disease in the abdomen or pelvis. 4. Tiny right perifissural lung nodule most likely subpleural lymph node. Attention on follow-up recommended. 5. 7 mm hypoattenuating lesion in the spleen, likely a cyst or pseudocyst. Attention on follow-up recommended. 6. Uterine fibroids. 7.  Aortic Atherosclerois (ICD10-170.0)      05/19/2017 Procedure    Port placement by Dr. Donell Beers on 9/11/8      05/22/2017 -  Chemotherapy    neoadjuvant chemotherpy AC every 2 weeks for 4 cycles, followed by paclitaxel every 2 weeks for 4 cycles starting 05/22/17         Past Medical History:  Diagnosis Date  . Anemia   . Breast cancer (HCC)   . Cancer [facility])    left breast cancer  . Family history of breast cancer   . GERD (gastroesophageal reflux disease)   . Headache    Migraines  . Hypertension     Past  Surgical History:  Procedure Laterality Date  . BREAST SURGERY     biopsy  . CESAREAN SECTION    . CHOLECYSTECTOMY N/A 02/07/2013   Procedure: LAPAROSCOPIC CHOLECYSTECTOMY WITH INTRAOPERATIVE CHOLANGIOGRAM;  Surgeon: Cherylynn Ridges, MD;  Location: [facility];  Service: General;  Laterality: N/A;  . COLONOSCOPY W/ BIOPSIES AND POLYPECTOMY    . FOOT SURGERY  2003   lt foot bunionectomy  . MULTIPLE TOOTH EXTRACTIONS    . PORTACATH PLACEMENT Right 05/19/2017   Procedure: INSERTION PORT-A-CATH;  Surgeon: Almond Lint, MD;  Location: [facility] OR;  Service: General;  Laterality: Right;    Social History   Social History  . Marital status: Single    Spouse name: N/A  . Number of children: N/A  . Years of education: N/A   Social History Main Topics  . Smoking status: Former Smoker    Types: Cigarettes  . Smokeless tobacco: Never Used     Comment: 15-20 years ago  . Alcohol use No  . Drug use: No  . Sexual activity: Not on file   Other Topics Concern  . Not on file   Social History Narrative   Tobacco use   Cigarettes: Never smoked    Tobacco  history last updated 01/23/2014   No smoking   No tobacco exposure   No alcohol   Caffeine : yes soda   No recreational drug use   Occupation :employed ,[employer] 01/25/2013   Martial status : single    Children: Louie Casa 01/25/2013   75 year old son     Family History:  During the visit, a 4-generation pedigree was obtained. Family tree will be scanned in the Media tab in Epic  Significant diagnoses include the following:  Family History  Problem Relation Age of Onset  . Breast cancer Maternal Grandmother        dx 78s; deceased 85  . Breast cancer Paternal Aunt 62       recurrence vs. 2nd primary at 76; currently 5  . Breast cancer Maternal Aunt 62       deceased 38  . Hypertension Mother   . Breast cancer Other        pat grandfather's sister; dx 44s  . Leukemia Paternal Uncle        deceased 35  . Breast  cancer Other        paternal distant cousin; dx 18s    Additionally, Briana Price has one son (age 55). She has one sister (age 43) who has no children. Her mother (age 61) is cancer-free. She had only one sister noted above. Her father (age 89) is cancer-free. He had a total of two brothers and 4 sisters.   Briana Price ancestry is African American. There is no known Jewish ancestry and no consanguinity.  Discussion: We reviewed the characteristics, features and inheritance patterns of hereditary cancer syndromes. We discussed her risk of harboring a mutation in the context of her personal and family history. We discussed the process of genetic testing, insurance coverage and implications of results: positive, negative and variant of unknown significance (VUS).    Briana Price questions were answered to her satisfaction today and she is welcome to call with any additional questions or concerns. Thank you for the referral and allowing [place] to share in the care of your patient.    Steele Berg, MS, [place] Certified Genetic Counselor phone: [phone] [email or web address]   ______________________________________________________________________ For Office Staff:  Number of people involved in session: 1 Was an Intern/ student involved with case: no

## 2017-06-03 NOTE — Progress Notes (Addendum)
Briana Price  [phone] Fax:([phone]) [phone]  Clinic follow up Note   Patient Care Team: Briana Caraway, MD as PCP - General (Family Medicine) Briana Merle, MD as Consulting Physician (Hematology) Briana Klein, MD as Consulting Physician (General Surgery) Briana Rudd, MD as Consulting Physician (Radiation Oncology) 06/05/2017  CHIEF COMPLAINTS:  Malignant neoplasm of lower-outer quadrant of left breast of female, triple negative   Oncology History   Cancer Staging Malignant neoplasm of lower-outer quadrant of left breast of female, estrogen receptor negative ([place]) Staging form: Breast, AJCC 8th Edition - Clinical: Stage IIB (cT2, cN0(f), cM0, G3, ER: Negative, PR: Negative, HER2: Negative) - Unsigned       Malignant neoplasm of lower-outer quadrant of left breast of female, estrogen receptor negative ([place])   04/27/2017 Mammogram    IMPRESSION: 1. Highly suspicious mass within the LEFT breast at the 5:30 o'clock axis, 4 cm from the nipple, measuring 2.5 cm, corresponding to the mammographic finding and corresponding to 1 of the palpable lumps in the left breast. Ultrasound-guided biopsy is recommended. 2. Mildly prominent lymph node in the LEFT axilla, with normal cortical thickness but with questionable effacement of the fatty hilum. Ultrasound-guided core biopsy is recommended. 3. No evidence of malignancy within the RIGHT breast. Also, no evidence of malignancy within the second area of clinical concern in the upper outer left breast.      04/29/2017 Initial Diagnosis    Malignant neoplasm of lower-outer quadrant of left breast of female, estrogen receptor negative ([place])      04/29/2017 Receptors her2    Estrogen Receptor: 0%, NEGATIVE Progesterone Receptor: 0%, NEGATIVE Proliferation Marker Ki67: 90% HER2 NEGATIVE       04/29/2017 Initial Biopsy     Diagnosis 1. 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 - SEE COMMENT. 2. Lymph node, needle/core biopsy, left axilla - THERE IN NO EVIDENCE OF CARCINOMA IN 1 OF 1 LYMPH NODE (0/1).      05/14/2017 Imaging    MRI of Breast Bilateral 05/14/17  IMPRESSION: 1. 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 2. Non mass enhancement extending approximately 3.2 cm anterior to the mass in the lower inner quadrant of the left breast, suspicious for DCIS. There is no correlate on recent mammography. The overall extent of the mass and non mass enhancement is approximately 5 cm. 3. No MRI evidence of malignancy involving the right breast. 4. No pathologic lymphadenopathy. Upper normal sized left axillary lymph nodes, the largest of which was previously biopsied no evidence of metastatic disease.      05/15/2017 Imaging    Bone scan whole body 05/15/17 IMPRESSION: No findings specific for metastatic disease to bone.      05/15/2017 Imaging    CT CAP W contrast 05/15/17  IMPRESSION: 1. 18 mm soft tissue lesion inferior left breast compatible with known primary malignancy. 2. Mildly enlarged left axillary lymph nodes in this patient with biopsy-proven metastatic disease to the left axilla. 3. No evidence for lymphadenopathy elsewhere in the chest. No lymphadenopathy in the abdomen or pelvis. No evidence for metastatic disease in the abdomen or pelvis. 4. Tiny right perifissural lung nodule most likely subpleural lymph node. Attention on follow-up recommended. 5. 7 mm hypoattenuating lesion in the spleen, likely a cyst or pseudocyst. Attention on follow-up recommended. 6. Uterine fibroids. 7.  Aortic Atherosclerois (ICD10-170.0)  05/19/2017 Procedure    Port placement by Dr. [name] on 9/11/8      05/22/2017 -  Chemotherapy    neoadjuvant chemotherpy AC every 2 weeks for 4 cycles, followed by  paclitaxel every 2 weeks for 4 cycles starting 05/22/17        05/22/2017 Pathology Results    Diagnosis Breast, left, needle core biopsy, lower inner quad - DUCTAL CARCINOMA IN SITU. ER 0%, NEGATIVE PR 0%, NEGATIVE      HISTORY OF PRESENTING ILLNESS: 8/29/8  Briana Price 51 y.o. 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  She presents to the clinic today with her mother and father.  She first noticed her a mass in her left breast on the 17th of August, It was a hard knot, no pain or tenderness. Her mammogram form 03/24/17 and other previous mammograms were all normal.   In the past she was diagnosed with HTN and takes hydrochlorothiazide and lisinopril. She had her gallbladder removed. Her maternal grandmother, maternal aunt and paternal aunt had breast cancer. 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 The pain can last up to 3 days.   Today she denies skin change around breast, pain, fatigue or weight loss. She still has regular periods that last 3 days. She work in [employer] for the [employer].   GYN HISTORY  Menarchal: 14 LMP: 05/01/17 Contraceptive: No HRT: n/a GP: G1P1 at age 71, she has a son  CURRENT THERAPY: neoadjuvant chemotherpy AC every 2 weeks for 4 cycles, followed by weekly paclitaxel with or without carboplatin for 12 cycles starting 05/22/17  INTERVAL HISTORY:  Briana Price is here for a follow up. She presents to the clinic today with her family. She is status post cycle 1 Adriamycin/Cytoxan with Neulasta on 05/22/2017. She had nausea without vomiting for 2-3 days after treatment, controlled with Compazine and Zofran when necessary. She denies constipation or diarrhea. She experienced mild yet fatigue for 3 days after treatment. She tried to go to work on the Tuesday after her treatment but became dyspneic and went home to rest. She recovered by the next day. She notes 2 "pimples" on her chest, one on the Port-A-Cath surgical  incision. Pus was drained and cultured in the lab this morning. She had a repeat left breast biopsy on 05/22/2017, the site is slightly bruised. She is otherwise doing well She denies fever, chills, chest pain, cough, shortness of breath, numbness, tingling, or edema.    MEDICAL HISTORY:  Past Medical History:  Diagnosis Date  . Anemia   . Breast cancer ([place])   . Cancer [facility])    left breast cancer  . Family history of breast cancer   . Genetic testing 05/28/2017   Common Cancers panel (46 genes) @ [facility] - No pathogenic mutations detected  . GERD (gastroesophageal reflux disease)   . Headache    Migraines  . Hypertension    SURGICAL HISTORY: Past Surgical History:  Procedure Laterality Date  . BREAST SURGERY     biopsy  . CESAREAN SECTION    . CHOLECYSTECTOMY N/A 02/07/2013   Procedure: LAPAROSCOPIC CHOLECYSTECTOMY WITH INTRAOPERATIVE CHOLANGIOGRAM;  Surgeon: Briana Ober, MD;  Location: [place];  Service: General;  Laterality: N/A;  . COLONOSCOPY W/ BIOPSIES AND POLYPECTOMY    . FOOT SURGERY  2003   lt foot bunionectomy  . MULTIPLE TOOTH EXTRACTIONS    . PORTACATH PLACEMENT Right 05/19/2017   Procedure: INSERTION PORT-A-CATH;  Surgeon: Briana Klein, MD;  Location: [place];  Service: General;  Laterality: Right;   SOCIAL HISTORY: Social History   Social History  . Marital status: Single    Spouse name: N/A  . Number of children: N/A  . Years of education: N/A   Occupational History  . Not on file.   Social History Main Topics  . Smoking status: Former Smoker    Types: Cigarettes  . Smokeless tobacco: Never Used     Comment: 15-20 years ago  . Alcohol use No  . Drug use: No  . Sexual activity: Not on file   Other Topics Concern  . Not on file   Social History Narrative   Tobacco use   Cigarettes: Never smoked    Tobacco history last updated 01/23/2014   No smoking   No tobacco exposure   No alcohol   Caffeine : yes soda   No recreational  drug use   Occupation :employed ,Briana Price 01/25/2013   Martial status : single    Children: Briana Price 01/25/2013   62 year old son   FAMILY HISTORY: Family History  Problem Relation Age of Onset  . Breast cancer Maternal Grandmother        dx 37s; deceased 5  . Breast cancer Paternal Aunt 40       recurrence vs. 2nd primary at 52; currently 31  . Breast cancer Maternal Aunt 62       deceased 62  . Hypertension Mother   . Breast cancer Other        pat grandfather's sister; dx 52s  . Leukemia Paternal Uncle        deceased 90  . Breast cancer Other        paternal distant cousin; dx 39s   ALLERGIES:  has No Known Allergies.  MEDICATIONS:  Current Outpatient Prescriptions  Medication Sig Dispense Refill  . aspirin-acetaminophen-caffeine (EXCEDRIN MIGRAINE) 250-250-65 MG tablet Take 2 tablets by mouth 2 (two) times daily as needed for headache.    . Cholecalciferol (VITAMIN D) 2000 units CAPS Take 2,000 Units by mouth daily.    . fluocinonide ointment (LIDEX) 3.15 % Apply 1 application topically daily as needed (rosacea).    . hydrochlorothiazide (HYDRODIURIL) 25 MG tablet Take 25 mg by mouth daily.    Marland Kitchen lidocaine-prilocaine (EMLA) cream Apply to affected area once 30 g 3  . lisinopril (PRINIVIL,ZESTRIL) 10 MG tablet Take 10 mgs by mouth daily  2  . ondansetron (ZOFRAN) 8 MG tablet Take 1 tablet (8 mg total) by mouth 2 (two) times daily as needed. Start on the third day after chemotherapy. 30 tablet 1  . oxyCODONE (OXY IR/ROXICODONE) 5 MG immediate release tablet Take 1-2 tablets (5-10 mg total) by mouth every 6 (six) hours as needed for moderate pain, severe pain or breakthrough pain. 20 tablet 0  . prochlorperazine (COMPAZINE) 10 MG tablet Take 1 tablet (10 mg total) by mouth every 6 (six) hours as needed (Nausea or vomiting). 30 tablet 1  . ciprofloxacin (CILOXAN) 0.3 % ophthalmic solution     . sulfamethoxazole-trimethoprim (BACTRIM DS,SEPTRA DS) 800-160 MG tablet Take 1  tablet by mouth 2 (two) times daily. 20 tablet 0   No current facility-administered medications for this visit.    REVIEW OF SYSTEMS:   Constitutional: Denies Fatigue, fevers, chills or abnormal night sweats. (+) Slight weight loss from last visit but overall stable weight Eyes: Denies blurriness of vision, double vision or watery eyes Ears, nose,  mouth, throat, and face: Denies mucositis or sore throat Respiratory: Denies cough, dyspnea or wheezes Cardiovascular: Denies palpitation, chest discomfort or lower extremity swelling Gastrointestinal:  Denies nausea, vomiting, constipation, diarrhea, heartburn or change in bowel habits. Denies blood in stool Skin: Denies abnormal skin rashes. (+) 2 pimples to right chest near Port-A-Cath site Lymphatics: Denies new lymphadenopathy or easy bruising Neurological:Denies numbness, tingling or new weaknesses Behavioral/Psych: Mood is stable, no new changes. Breast: (+) Palpable lump in right breast, (+) bruising to left inner lower breast biopsy site All other systems were reviewed with the patient and are negative.  PHYSICAL EXAMINATION: ECOG PERFORMANCE STATUS: 1 - Symptomatic but completely ambulatory BP 133/86 (BP Location: Right Arm, Patient Position: Sitting)   Pulse 80   Temp 98.2 F (36.8 C) (Oral)   Resp 20   Ht '5\' 6"'$  (1.676 m)   Wt 211 lb 12.8 oz (96.1 kg)   SpO2 100%   BMI 34.19 kg/m   Vitals:   06/05/17 0852  BP: 133/86  Pulse: 80  Resp: 20  Temp: 98.2 F (36.8 C)  TempSrc: Oral  SpO2: 100%  Weight: 211 lb 12.8 oz (96.1 kg)  Height: '5\' 6"'$  (1.676 m)   GENERAL:alert, no distress and comfortable SKIN: skin color, texture, turgor are normal, no rashes or significant lesions, except a 0.5cm pimples in front up chest and next to the port incision, with a whitehead   EYES: normal, conjunctiva are pink and non-injected, sclera clear OROPHARYNX:no exudate, no erythema and lips, buccal mucosa, and tongue normal  NECK: supple,  thyroid normal size, non-tender, without nodularity LYMPH:  no palpable lymphadenopathy in the cervical, axillary or inguinal LUNGS: clear to auscultation and percussion with normal breathing effort HEART: regular rate & rhythm and no murmurs and no lower extremity edema ABDOMEN:abdomen soft, non-tender and normal bowel sounds Musculoskeletal:no cyanosis of digits and no clubbing  PSYCH: alert & oriented x 3 with fluent speech NEURO: no focal motor/sensory deficits Breast: Left breast: no palpable mass in left axilla, (+) palpable mass in 7:00 position Inner lower quadrant 2.5x3.0 cm with skin ecchymosis. Biopsy site healing well. Right breat: no palpable mass in right breast or axilla.   LABORATORY DATA:  I have reviewed the data as listed CBC Latest Ref Rng & Units 06/05/2017 05/22/2017 05/06/2017  WBC 3.9 - 10.3 10e3/uL 14.8(H) 9.7 8.4  Hemoglobin 11.6 - 15.9 g/dL 11.3(L) 11.2(L) 11.9  Hematocrit 34.8 - 46.6 % 33.9(L) 34.8 36.2  Platelets 145 - 400 10e3/uL 262 377 435(H)   CMP Latest Ref Rng & Units 06/05/2017 05/22/2017 05/06/2017  Glucose 70 - 140 mg/dl 164(H) 91 110  BUN 7.0 - 26.0 mg/dL 5.2(L) 11.0 7.2  Creatinine 0.6 - 1.1 mg/dL 0.7 0.7 0.7  Sodium 136 - 145 mEq/L 138 137 140  Potassium 3.5 - 5.1 mEq/L 3.7 3.6 4.2  Chloride 96 - 112 mEq/L - - -  CO2 22 - 29 mEq/L 27 27 30(H)  Calcium 8.4 - 10.4 mg/dL 9.8 9.4 10.0  Total Protein 6.4 - 8.3 g/dL 7.5 7.2 8.1  Total Bilirubin 0.20 - 1.20 mg/dL 0.25 0.62 0.61  Alkaline Phos 40 - 150 U/L 91 58 70  AST 5 - 34 U/L '23 15 21  '$ ALT 0 - 55 U/L '20 12 20   '$ PATHOLOGY  Diagnosis 05/22/17 Breast, left, needle core biopsy, lower inner quad - DUCTAL CARCINOMA IN SITU. - SEE COMMENT. 1 of 2 FINAL for COZY, VEALE [phone]) Microscopic Comment The carcinoma is high grade.  Estrogen receptor and progesterone receptor studies will be performed and the results reported separately. The results were called to [facility]  on 05/25/17. (JBK:gt, 05/25/17) PROGNOSTIC INDICATORS Results: IMMUNOHISTOCHEMICAL AND MORPHOMETRIC ANALYSIS PERFORMED MANUALLY Estrogen Receptor: 0%, NEGATIVE Progesterone Receptor: 0%, NEGATIVE COMMENT: The negative hormone receptor study(ies) in this case has an internal positive control.   Diagnosis 04/29/17 1. Breast, left, needle core biopsy, 5:30 o'clock, mass - INVASIVE DUCTAL CARCINOMA. - DUCTAL CARCINOMA IN SITU. - SEE COMMENT. 2. Lymph node, needle/core biopsy, left axilla - THERE IN NO EVIDENCE OF CARCINOMA IN 1 OF 1 LYMPH NODE (0/1). Microscopic Comment 1. The carcinoma appears grade 2-3. A breast prognostic profile will be performed and the results reported separately. The results were called to [facility] on 04/30/17. (JBK:gt, 04/30/17) 1. PROGNOSTIC INDICATORS Results: IMMUNOHISTOCHEMICAL AND MORPHOMETRIC ANALYSIS PERFORMED MANUALLY Estrogen Receptor: 0%, NEGATIVE Progesterone Receptor: 0%, NEGATIVE Proliferation Marker Ki67: 90% COMMENT: The negative hormone receptor study(ies) in this case has an internal positive control. 1. 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 Systolic function was normal. The estimated ejection fraction was in the range of 55% to 60%. Wall motion was normal; there were no regional wall motion abnormalities. Left ventricular diastolic function parameters were normal.  RADIOGRAPHIC STUDIES: I have personally reviewed the radiological images as listed and agreed with the findings in the report.  Bone scan whole body 05/15/17 IMPRESSION: No findings specific for metastatic disease to bone.  CT CAP W contrast 05/15/17  IMPRESSION: 1. 18 mm soft tissue lesion inferior left breast compatible with known primary malignancy. 2. Mildly enlarged  left axillary lymph nodes in this patient with biopsy-proven metastatic disease to the left axilla. 3. No evidence for lymphadenopathy elsewhere in the chest. No lymphadenopathy in the abdomen or pelvis. No evidence for metastatic disease in the abdomen or pelvis. 4. Tiny right perifissural lung nodule most likely subpleural lymph node. Attention on follow-up recommended. 5. 7 mm hypoattenuating lesion in the spleen, likely a cyst or pseudocyst. Attention on follow-up recommended. 6. Uterine fibroids. 7.  Aortic Atherosclerois (ICD10-170.0)  Ct Chest W Contrast  Result Date: 05/15/2017 CLINICAL DATA:  Breast cancer EXAM: CT CHEST, ABDOMEN, AND PELVIS WITH CONTRAST TECHNIQUE: Multidetector CT imaging of the chest, abdomen and pelvis was performed following the standard protocol during bolus administration of intravenous contrast. CONTRAST:  19m ISOVUE-300 IOPAMIDOL (ISOVUE-300) INJECTION 61% COMPARISON:  Abdomen and pelvis CT 06/25/2016 FINDINGS: CT CHEST FINDINGS Cardiovascular: Heart is enlarged. No pericardial effusion. No thoracic aortic aneurysm. Mediastinum/Nodes: No mediastinal lymphadenopathy. There is no hilar lymphadenopathy. 12 mm short axis left axillary lymph node is identified on image 19 series 2. Another 10 mm short axis left axillary lymph node is seen on image 18. While the these are only minimally increased in size, there are asymmetric when comparing to the right. Lungs/Pleura: Tiny 1-2 mm perifissural nodule right lung identified image 56 series 6. Lungs otherwise clear. Musculoskeletal: Bone windows reveal no worrisome lytic or sclerotic osseous lesions. 18 mm soft tissue lesion identified inferior left breast with apparent localization clip, compatible with primary lesion. CT ABDOMEN PELVIS FINDINGS Hepatobiliary: No focal abnormality within the liver parenchyma. Gallbladder surgically absent. No intrahepatic or extrahepatic biliary dilation. Pancreas: No focal mass lesion. No  dilatation of the main duct. No intraparenchymal cyst. No peripancreatic edema.  Spleen: No splenomegaly. 7 mm hypoattenuating lesion identified in the subcapsular spleen (image 50 series 2), not seen on prior study. Adrenals/Urinary Tract: No adrenal nodule or mass. Kidneys unremarkable. No evidence for hydroureter. The urinary bladder appears normal for the degree of distention. Stomach/Bowel: Tiny hiatal hernia. Stomach otherwise unremarkable. Duodenum is normally positioned as is the ligament of Treitz. No small bowel wall thickening. No small bowel dilatation. The terminal ileum is normal. The appendix is normal. Diverticuli are seen scattered along the entire length of the colon without CT findings of diverticulitis. Vascular/Lymphatic: There is abdominal aortic atherosclerosis without aneurysm. Portal vein and superior mesenteric vein are patent. Duplication of IVC evident. There is no gastrohepatic or hepatoduodenal ligament lymphadenopathy. No intraperitoneal or retroperitoneal lymphadenopathy. Small lymph nodes are seen along each pelvic sidewall, similar to prior, without lymphadenopathy. Reproductive: Fibroid changes noted in the uterus. There is no adnexal mass. Other: No intraperitoneal free fluid. Musculoskeletal: Bone windows reveal no worrisome lytic or sclerotic osseous lesions. Bone windows reveal no worrisome lytic or sclerotic osseous lesions. IMPRESSION: 1. 18 mm soft tissue lesion inferior left breast compatible with known primary malignancy. 2. Mildly enlarged left axillary lymph nodes in this patient with biopsy-proven metastatic disease to the left axilla. 3. No evidence for lymphadenopathy elsewhere in the chest. No lymphadenopathy in the abdomen or pelvis. No evidence for metastatic disease in the abdomen or pelvis. 4. Tiny right perifissural lung nodule most likely subpleural lymph node. Attention on follow-up recommended. 5. 7 mm hypoattenuating lesion in the spleen, likely a cyst or  pseudocyst. Attention on follow-up recommended. 6. Uterine fibroids. 7.  Aortic Atherosclerois (ICD10-170.0) Electronically Signed   By: Misty Stanley M.D.   On: 05/15/2017 15:43   Nm Bone Scan Whole Body  Result Date: 05/15/2017 CLINICAL DATA:  51 year old female with left breast cancer. Staging. EXAM: NUCLEAR MEDICINE WHOLE BODY BONE SCAN TECHNIQUE: Whole body anterior and posterior images were obtained approximately 3 hours after intravenous injection of radiopharmaceutical. RADIOPHARMACEUTICALS:  18.6 mCi Technetium-53mMDP IV COMPARISON:  CT chest, Abdomen and Pelvis today reported separately. FINDINGS: Expected radiotracer activity in both kidneys in the urinary bladder. Minimal perineum contamination. Homogeneous radiotracer activity throughout the axial skeleton including the skull. Homogeneous radiotracer activity throughout the visible upper extremities. Homogeneous radiotracer activity throughout the lower extremities except for degenerative appearing increased activity along the medial compartment of the left knee joint. IMPRESSION: No findings specific for metastatic disease to bone. Electronically Signed   By: HGenevie AnnM.D.   On: 05/15/2017 14:35   Ct Abdomen Pelvis W Contrast  Result Date: 05/15/2017 CLINICAL DATA:  Breast cancer EXAM: CT CHEST, ABDOMEN, AND PELVIS WITH CONTRAST TECHNIQUE: Multidetector CT imaging of the chest, abdomen and pelvis was performed following the standard protocol during bolus administration of intravenous contrast. CONTRAST:  1069mISOVUE-300 IOPAMIDOL (ISOVUE-300) INJECTION 61% COMPARISON:  Abdomen and pelvis CT 06/25/2016 FINDINGS: CT CHEST FINDINGS Cardiovascular: Heart is enlarged. No pericardial effusion. No thoracic aortic aneurysm. Mediastinum/Nodes: No mediastinal lymphadenopathy. There is no hilar lymphadenopathy. 12 mm short axis left axillary lymph node is identified on image 19 series 2. Another 10 mm short axis left axillary lymph node is seen on image  18. While the these are only minimally increased in size, there are asymmetric when comparing to the right. Lungs/Pleura: Tiny 1-2 mm perifissural nodule right lung identified image 56 series 6. Lungs otherwise clear. Musculoskeletal: Bone windows reveal no worrisome lytic or sclerotic osseous lesions. 18 mm soft tissue lesion identified inferior left  breast with apparent localization clip, compatible with primary lesion. CT ABDOMEN PELVIS FINDINGS Hepatobiliary: No focal abnormality within the liver parenchyma. Gallbladder surgically absent. No intrahepatic or extrahepatic biliary dilation. Pancreas: No focal mass lesion. No dilatation of the main duct. No intraparenchymal cyst. No peripancreatic edema. Spleen: No splenomegaly. 7 mm hypoattenuating lesion identified in the subcapsular spleen (image 50 series 2), not seen on prior study. Adrenals/Urinary Tract: No adrenal nodule or mass. Kidneys unremarkable. No evidence for hydroureter. The urinary bladder appears normal for the degree of distention. Stomach/Bowel: Tiny hiatal hernia. Stomach otherwise unremarkable. Duodenum is normally positioned as is the ligament of Treitz. No small bowel wall thickening. No small bowel dilatation. The terminal ileum is normal. The appendix is normal. Diverticuli are seen scattered along the entire length of the colon without CT findings of diverticulitis. Vascular/Lymphatic: There is abdominal aortic atherosclerosis without aneurysm. Portal vein and superior mesenteric vein are patent. Duplication of IVC evident. There is no gastrohepatic or hepatoduodenal ligament lymphadenopathy. No intraperitoneal or retroperitoneal lymphadenopathy. Small lymph nodes are seen along each pelvic sidewall, similar to prior, without lymphadenopathy. Reproductive: Fibroid changes noted in the uterus. There is no adnexal mass. Other: No intraperitoneal free fluid. Musculoskeletal: Bone windows reveal no worrisome lytic or sclerotic osseous  lesions. Bone windows reveal no worrisome lytic or sclerotic osseous lesions. IMPRESSION: 1. 18 mm soft tissue lesion inferior left breast compatible with known primary malignancy. 2. Mildly enlarged left axillary lymph nodes in this patient with biopsy-proven metastatic disease to the left axilla. 3. No evidence for lymphadenopathy elsewhere in the chest. No lymphadenopathy in the abdomen or pelvis. No evidence for metastatic disease in the abdomen or pelvis. 4. Tiny right perifissural lung nodule most likely subpleural lymph node. Attention on follow-up recommended. 5. 7 mm hypoattenuating lesion in the spleen, likely a cyst or pseudocyst. Attention on follow-up recommended. 6. Uterine fibroids. 7.  Aortic Atherosclerois (ICD10-170.0) Electronically Signed   By: Misty Stanley M.D.   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 Left axillary lymph node biopsy revealed no evidence of metastatic disease. Evaluation prior to neoadjuvant chemotherapy. LABS:  Not applicable. EXAM: BILATERAL BREAST MRI WITH AND WITHOUT CONTRAST TECHNIQUE: Multiplanar, multisequence MR images of both breasts were obtained prior to and following the intravenous administration of 20 ml of MultiHance. THREE-DIMENSIONAL MR IMAGE RENDERING ON INDEPENDENT WORKSTATION: Three-dimensional MR images were rendered by post-processing of the original MR data on an independent workstation. The three-dimensional MR images were interpreted, and findings are reported in the following complete MRI report for this study. Three dimensional images were evaluated at the independent DynaCad workstation. COMPARISON:  No prior MRI. Mammography 04/29/2017, 04/27/2017 and earlier. Right breast ultrasound 04/29/2017, 04/27/2017. FINDINGS: Breast composition: c. Heterogeneous  fibroglandular tissue. Background parenchymal enhancement: Moderate. Right breast: No mass or abnormal enhancement. Left breast: Enhancing mass involving the lower outer left breast at posterior depth measuring approximately 2.5 (AP) X 2.1 (transverse) X 2.1 (craniocaudal) cm, demonstrating washout kinetics, biopsy-proven invasive ductal carcinoma, within which there is blooming artifact from the clip placed at the time of biopsy. Non mass enhancement extending from the mass anteriorly into the lower inner quadrant, measuring approximately 3.2 x 1.7 x 0.9 cm, demonstrating plateau kinetics. There is no correlate on scratch the recent mammography. In total, the mass and the contiguous non mass enhancement span  approximately 5 cm. No abnormal enhancement elsewhere in the left breast. Lymph nodes: Upper normal sized left axillary lymph nodes, the more anterior of which measures approximately 1.9 cm and the more posterior of which measures approximately 1.5 cm. The more anterior lymph node was previous biopsied, demonstrating no evidence of metastatic disease. Ancillary findings:  None. IMPRESSION: 1. 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 2. Non mass enhancement extending approximately 3.2 cm anterior to the mass in the lower inner quadrant of the left breast, suspicious for DCIS. There is no correlate on recent mammography. The overall extent of the mass and non mass enhancement is approximately 5 cm. 3. No MRI evidence of malignancy involving the right breast. 4. No pathologic lymphadenopathy. Upper normal sized left axillary lymph nodes, the largest of which was previously biopsied no evidence of metastatic disease. RECOMMENDATION: MRI guided biopsy of the anterior extent of the non mass enhancement in order to confirm the overall extent of disease involving the lower outer and lower inner quadrants of the  left breast. BI-RADS CATEGORY  4: Suspicious. Electronically Signed   By: Evangeline Dakin M.D.   On: 05/14/2017 16:32   Dg Chest Port 1 View  Result Date: 05/19/2017 CLINICAL DATA:  Intraoperative Port-A-Cath placement. 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 EXAM: PORTABLE CHEST 1 VIEW COMPARISON:  CT chest 05/15/2017. FINDINGS: Right subclavian Port-A-Cath tip projects over the mid SVC. No evidence of pneumothorax or mediastinal hematoma. Near expiratory image accounts for the crowded bronchovascular markings at the bases. Taking this into account, lungs clear. No pleural effusions. Cardiac silhouette upper normal in size. Pulmonary vascularity normal. IMPRESSION: 1. Right subclavian central venous catheter tip projects over the mid SVC. No complicating features. 2. No acute cardiopulmonary disease. Near expiratory image accounts for crowded markings at the bases. Electronically Signed   By: Evangeline Dakin M.D.   On: 05/19/2017 16:49   Dg Fluoro Guide Cv Line-no Report  Result Date: 05/19/2017 Fluoroscopy was utilized by the requesting physician.  No radiographic interpretation.   Mm Clip Placement Left  Result Date: 05/22/2017 CLINICAL DATA:  Patient status post MRI guided core needle biopsy left breast non mass enhancement. EXAM: DIAGNOSTIC LEFT MAMMOGRAM POST MRI BIOPSY COMPARISON:  Previous exam(s). FINDINGS: Mammographic images were obtained following MRI guided biopsy of left breast non mass enhancement. Dumbbell-shaped marking clip within the lower inner left breast, in appropriate position. Ribbon shaped marking clip at the site of prior ultrasound-guided core needle biopsy. IMPRESSION: Appropriate position dumbbell-shaped marking clip lower inner left breast. Final Assessment: Post Procedure Mammograms for Marker Placement Electronically Signed   By: Lovey Newcomer M.D.   On: 05/22/2017 09:15   Mr Aundra Millet Breast Bx Johnella Moloney Dev  1st Lesion Image Bx Spec Mr Guide  Addendum Date: 05/25/2017   ADDENDUM REPORT: 05/25/2017 14:09 ADDENDUM: Pathology revealed HIGH GRADE DUCTAL CARCINOMA IN SITU of the Left breast, lower inner quadrant. This was found to be concordant by Dr. Lovey Newcomer. Pathology results were discussed with the patient by telephone. The patient reported doing well after the biopsy with tenderness at the site. Post biopsy instructions and care were reviewed and questions were answered. The patient was encouraged to call [facility] for any additional concerns. The patient has a recent diagnosis of left breast cancer and should follow her outlined treatment plan.  Pathology results were sent to Dr. Stark Price and Dr. Truitt Price via [facility] message. The patient is scheduled to see Dr. Stark Price at [facility] on May 29, 2017. Pathology results reported by Terie Purser, RN on 05/25/2017. Electronically Signed   By: Lovey Newcomer M.D.   On: 05/25/2017 14:09   Result Date: 05/25/2017 CLINICAL DATA:  Patient with indeterminate non mass enhancement anterior to the known left breast malignancy on prior MRI. For MRI guided core needle biopsy. EXAM: MRI GUIDED CORE NEEDLE BIOPSY OF THE LEFT BREAST TECHNIQUE: Multiplanar, multisequence MR imaging of the left breast was performed both before and after administration of intravenous contrast. CONTRAST:  47m MULTIHANCE GADOBENATE DIMEGLUMINE 529 MG/ML IV SOLN COMPARISON:  Previous exams. FINDINGS: I met with the patient, and we discussed the procedure of MRI guided biopsy, including risks, benefits, and alternatives. Specifically, we discussed the risks of infection, bleeding, tissue injury, clip migration, and inadequate sampling. Informed, written consent was given. The usual time out protocol was performed immediately prior to the procedure. Using sterile technique, 1% Lidocaine, MRI guidance, and a 9 gauge vacuum assisted device, biopsy was  performed of non mass enhancement within the lower inner left breast using a lateral approach. At the conclusion of the procedure, a dumbbell-shaped tissue marker clip was deployed into the biopsy cavity. Follow-up 2-view mammogram was performed and dictated separately. IMPRESSION: MRI guided biopsy of non mass enhancement lower inner left breast. No apparent complications. Electronically Signed: By: DLovey NewcomerM.D. On: 05/22/2017 09:20   ASSESSMENT & PLAN:  TLAWANA HARTZELLis a 51y.o. african-american female with a history of HTN and arthritis in her back, presented with a palpable left breast mass.  1. 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  -We discussed her mammogram, ultrasound findings, and initial biopsy results with patient and her family members in details.  -She presented with a palpable left breast mass, measures 2.5 cm on ultrasound, with a prominent lymph node. 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 -She is a candidate for lumpectomy and sentinel lymph node biopsy. She was seen by surgeon Dr. BBarry Dienestoday. Due to her young age and triple negative disease, we recommended her to see genetics, to ruled out inheritable genetic syndrome. -Dr. FBurr Medicopreviously reviewed her breast MRI findings, which showed additional non-mass enhancement in the left breast, which is anterior to the biopsy proven breast cancer, in the lower inner quadrant. Biopsy confirmed DCIS ER 0%, negative, PR 0%, negative on 05/22/2017 -Dr. FBurr Medicopreviously reviewed her CT scan and bone scan findings in great detail, no evidence of distant metastasis. -both MRI and a CT scan showed enlarged left axillary lymph node, but biopsy was negative for malignancy, still have some concern that she may have metastatic adenopathy. -She consented to  neoadjuvant chemotherapy. She has concerns about the secondary malignancy from Adriamycin. However given the extensive of her disease, especially if the second biopsy is positive for invasive disease, Dr. FBurr Medicostrongly encourage her to take Adriamycin and Cytoxan, followed by Taxol with or without carboplatin. -Dr. FBurr Medicoattempt[facility] but she has not called back. MD consulted with Dr. MJana Hakimwho recommends AC-T also.  -She tolerated cycle 1 AC with Neulasta well overall, mild fatigue and nausea --Labs are overall normal, she is slightly anemic, Hg at  11.3, does not require blood transfusion at this time. Otherwise she is adequate to  proceed with cycle 2 AC with onpro device  -We will not use her port for chemotherapy today, due to her skin infection next the port -F/u in 2 weeks prior to next cycle -She knows to call our clinic with new or worsening concerns or side effects such as diarrhea, dehydration, fever, chills  2. Left breast DCIS, ER 0%, negative, PR 0%, negative -Dr. Burr Medico previously reviewed her breast MRI findings, which showed additional non-mass enhancement in the left breast, which is anterior to the biopsy proven breast cancer, in the lower inner quadrant. -Biopsy showed DICS on 05/22/2017 -I reviewed the results with the patient and her family today; no change in current treatment plan   3. Genetics -Based on her significant familial breast cancer history, her young age and triple negative disease, she qualified for genetic testing for possible gene mutations.  -Apt with genetics counselor on 05/28/17; genetics testing done at that time, results pending  4. HTN -she is on HCTZ and lisinopril , we'll continue, she will follow-up with her primary care physician  -We discussed the impact of chemotherapy, blood pressure, and a possibility that we may hold her blood pressure medication if she gets dehydrated.  -BP stable lately; 133/86  today  5. Rosacea -Dermatologist prescribed topical steroid -OK to use during chemotherapy; she will also receive IV steroid which may help control  6. [facility] at [facility] and folliculitis in front upper chest  -drained and cultured today, will f/u with results -patient is afebrile without signs of systemic infection, but is immune compromised while undergoing chemotherapy -Culture is pending, empiric bactrim DS BID x10 days for MRSA coverage prescribed today -she will call if wound becomes wrosened red, painful, edematous, and if she becomes febrile or has chills or if she has side effects from medication such as GI upset or skin rash  PLAN:  -lab reviewed, will proceed with cycle 2 AC today with Onpro -return for lab, flush, f/u and next cycle in 2 weeks   -Will call pt with wound culture results  -Rx Bactrim DS today, sent to pharmacy  All questions were answered. The patient knows to call the clinic with any problems, questions or concerns.  I spent 25 minutes counseling the patient face to face. The total time spent in the appointment was 30 minutes and more than 50% was on counseling.  Cira Rue, AGNP-C 06/05/2017  I have seen the patient, examined her. I agree with the assessment and and plan and have edited the notes.   Briana Price  06/05/2017

## 2017-06-05 ENCOUNTER — Ambulatory Visit (HOSPITAL_BASED_OUTPATIENT_CLINIC_OR_DEPARTMENT_OTHER): Payer: Federal, State, Local not specified - PPO

## 2017-06-05 ENCOUNTER — Other Ambulatory Visit (HOSPITAL_BASED_OUTPATIENT_CLINIC_OR_DEPARTMENT_OTHER): Payer: Federal, State, Local not specified - PPO

## 2017-06-05 ENCOUNTER — Ambulatory Visit (HOSPITAL_BASED_OUTPATIENT_CLINIC_OR_DEPARTMENT_OTHER): Payer: Federal, State, Local not specified - PPO | Admitting: Hematology

## 2017-06-05 ENCOUNTER — Encounter: Payer: Self-pay | Admitting: Hematology

## 2017-06-05 ENCOUNTER — Ambulatory Visit: Payer: Federal, State, Local not specified - PPO

## 2017-06-05 ENCOUNTER — Other Ambulatory Visit: Payer: Self-pay

## 2017-06-05 ENCOUNTER — Ambulatory Visit: Payer: Self-pay | Admitting: Genetic Counselor

## 2017-06-05 ENCOUNTER — Encounter: Payer: Self-pay | Admitting: Genetic Counselor

## 2017-06-05 VITALS — BP 133/86 | HR 80 | Temp 98.2°F | Resp 20 | Ht 66.0 in | Wt 211.8 lb

## 2017-06-05 DIAGNOSIS — C50512 Malignant neoplasm of lower-outer quadrant of left female breast: Secondary | ICD-10-CM

## 2017-06-05 DIAGNOSIS — Z1379 Encounter for other screening for genetic and chromosomal anomalies: Secondary | ICD-10-CM

## 2017-06-05 DIAGNOSIS — Z5111 Encounter for antineoplastic chemotherapy: Secondary | ICD-10-CM | POA: Diagnosis not present

## 2017-06-05 DIAGNOSIS — I10 Essential (primary) hypertension: Secondary | ICD-10-CM

## 2017-06-05 DIAGNOSIS — T148XXA Other injury of unspecified body region, initial encounter: Secondary | ICD-10-CM

## 2017-06-05 DIAGNOSIS — C773 Secondary and unspecified malignant neoplasm of axilla and upper limb lymph nodes: Secondary | ICD-10-CM | POA: Diagnosis not present

## 2017-06-05 DIAGNOSIS — Z171 Estrogen receptor negative status [ER-]: Principal | ICD-10-CM

## 2017-06-05 DIAGNOSIS — L089 Local infection of the skin and subcutaneous tissue, unspecified: Secondary | ICD-10-CM

## 2017-06-05 DIAGNOSIS — Z5189 Encounter for other specified aftercare: Secondary | ICD-10-CM | POA: Diagnosis not present

## 2017-06-05 LAB — CBC WITH DIFFERENTIAL/PLATELET
BASO%: 0.8 % (ref 0.0–2.0)
BASOS ABS: 0.1 10*3/uL (ref 0.0–0.1)
EOS%: 0.2 % (ref 0.0–7.0)
Eosinophils Absolute: 0 10*3/uL (ref 0.0–0.5)
HEMATOCRIT: 33.9 % — AB (ref 34.8–46.6)
HEMOGLOBIN: 11.3 g/dL — AB (ref 11.6–15.9)
LYMPH#: 1.7 10*3/uL (ref 0.9–3.3)
LYMPH%: 11.4 % — ABNORMAL LOW (ref 14.0–49.7)
MCH: 27 pg (ref 25.1–34.0)
MCHC: 33.3 g/dL (ref 31.5–36.0)
MCV: 81.1 fL (ref 79.5–101.0)
MONO#: 0.7 10*3/uL (ref 0.1–0.9)
MONO%: 4.8 % (ref 0.0–14.0)
NEUT#: 12.2 10*3/uL — ABNORMAL HIGH (ref 1.5–6.5)
NEUT%: 82.8 % — AB (ref 38.4–76.8)
PLATELETS: 262 10*3/uL (ref 145–400)
RBC: 4.19 10*6/uL (ref 3.70–5.45)
RDW: 13.5 % (ref 11.2–14.5)
WBC: 14.8 10*3/uL — ABNORMAL HIGH (ref 3.9–10.3)

## 2017-06-05 LAB — COMPREHENSIVE METABOLIC PANEL
ALBUMIN: 3.5 g/dL (ref 3.5–5.0)
ALT: 20 U/L (ref 0–55)
ANION GAP: 10 meq/L (ref 3–11)
AST: 23 U/L (ref 5–34)
Alkaline Phosphatase: 91 U/L (ref 40–150)
BUN: 5.2 mg/dL — ABNORMAL LOW (ref 7.0–26.0)
CALCIUM: 9.8 mg/dL (ref 8.4–10.4)
CHLORIDE: 101 meq/L (ref 98–109)
CO2: 27 meq/L (ref 22–29)
Creatinine: 0.7 mg/dL (ref 0.6–1.1)
EGFR: >90 mL/min/{1.73_m2} (ref >90)
Glucose: 164 mg/dl — ABNORMAL HIGH (ref 70–140)
Potassium: 3.7 mEq/L (ref 3.5–5.1)
Sodium: 138 mEq/L (ref 136–145)
Total Bilirubin: 0.25 mg/dL (ref 0.20–1.20)
Total Protein: 7.5 g/dL (ref 6.4–8.3)

## 2017-06-05 MED ORDER — PALONOSETRON HCL INJECTION 0.25 MG/5ML
INTRAVENOUS | Status: AC
  Filled 2017-06-05: qty 5

## 2017-06-05 MED ORDER — SODIUM CHLORIDE 0.9 % IV SOLN
Freq: Once | INTRAVENOUS | Status: AC
  Administered 2017-06-05: 10:00:00 via INTRAVENOUS

## 2017-06-05 MED ORDER — DOXORUBICIN HCL CHEMO IV INJECTION 2 MG/ML
60.0000 mg/m2 | Freq: Once | INTRAVENOUS | Status: AC
  Administered 2017-06-05: 130 mg via INTRAVENOUS
  Filled 2017-06-05: qty 65

## 2017-06-05 MED ORDER — PALONOSETRON HCL INJECTION 0.25 MG/5ML
0.2500 mg | Freq: Once | INTRAVENOUS | Status: AC
  Administered 2017-06-05: 0.25 mg via INTRAVENOUS

## 2017-06-05 MED ORDER — SULFAMETHOXAZOLE-TRIMETHOPRIM 800-160 MG PO TABS: 1.0000 | ORAL_TABLET | Freq: Two times a day (BID) | ORAL | 0 refills | Status: DC

## 2017-06-05 MED ORDER — SODIUM CHLORIDE 0.9 % IV SOLN
Freq: Once | INTRAVENOUS | Status: AC
  Administered 2017-06-05: 10:00:00 via INTRAVENOUS
  Filled 2017-06-05: qty 5

## 2017-06-05 MED ORDER — PEGFILGRASTIM 6 MG/0.6ML ~~LOC~~ PSKT
6.0000 mg | PREFILLED_SYRINGE | Freq: Once | SUBCUTANEOUS | Status: AC
  Administered 2017-06-05: 6 mg via SUBCUTANEOUS
  Filled 2017-06-05: qty 0.6

## 2017-06-05 MED ORDER — SODIUM CHLORIDE 0.9 % IV SOLN
600.0000 mg/m2 | Freq: Once | INTRAVENOUS | Status: AC
  Administered 2017-06-05: 1300 mg via INTRAVENOUS
  Filled 2017-06-05: qty 65

## 2017-06-05 NOTE — Addendum Note (Signed)
Addended by: Sinda Du on: 06/05/2017 09:32 AM   Modules accepted: Orders

## 2017-06-05 NOTE — Progress Notes (Signed)
Patient presented to treatment area for [facility] access. Noted pustule mid chest (approximately 8-10 mm in size). Area surrounding pustule erythematous and tender to touch. Patient claims she has had "skin problems for years," and this area does not seem unusual for her. Briana Price claims she currently has multiple "bumps" on her scalp, her back, and her nose. On further evaluation, noted pustule at most lateral portion of port incision site. This area actively draining. Slight erythema surrounding area. Dr. Burr Medico evaluated in treatment area and advised to obtain peripheral access for treatment today. IV started as documented. Brisk blood return. Drew labs from same site and then flushed with normal saline. Patient states there is no discomfort with flush. No evidence of infiltration.

## 2017-06-05 NOTE — Patient Instructions (Addendum)
[facility] Discharge Instructions for Patients Receiving Chemotherapy  Today you received the following chemotherapy agents: Adriamycin and Cytoxan   To help prevent nausea and vomiting after your treatment, we encourage you to take your nausea medication as directed.   prochlorperazine (COMPAZINE) 10 MG tablet 30 tablet 1 05/08/2017    Sig - Route: Take 1 tablet (10 mg total) by mouth every 6 (six) hours as needed (Nausea or vomiting). - Oral   Sent to pharmacy as: prochlorperazine (COMPAZINE) 10 MG tablet   Non-formulary Exception Code: Dosage strength/form       If you develop nausea and vomiting that is not controlled by your nausea medication, call the clinic.   BELOW ARE SYMPTOMS THAT SHOULD BE REPORTED IMMEDIATELY:  *FEVER GREATER THAN 100.5 F  *CHILLS WITH OR WITHOUT FEVER  NAUSEA AND VOMITING THAT IS NOT CONTROLLED WITH YOUR NAUSEA MEDICATION  *UNUSUAL SHORTNESS OF BREATH  *UNUSUAL BRUISING OR BLEEDING  TENDERNESS IN MOUTH AND THROAT WITH OR WITHOUT PRESENCE OF ULCERS  *URINARY PROBLEMS  *BOWEL PROBLEMS  UNUSUAL RASH Items with * indicate a potential emergency and should be followed up as soon as possible.  Feel free to call the clinic should you have any questions or concerns. The clinic phone number is [phone].  Please show the [place] at check-in to the Emergency Department and triage nurse.

## 2017-06-05 NOTE — Progress Notes (Signed)
Cancer Genetics Clinic       Genetic Test Results    Patient Name: Briana Price Patient DOB: 1965/11/30 Patient Age: 51 y.o. Encounter Date: 06/05/2017  Referring Provider: [name], MD  Primary Care Provider: [name], MD   Briana Price was called today to discuss genetic test results. Please see the Genetics note from her visit on 05/28/2017 for a detailed discussion of her personal and family history.  Genetic Testing: At the time of Briana Price's visit, we recommended she pursue genetic testing of multiple genes associated with hereditary susceptibility to cancer. Testing included sequencing and deletion/duplication analysis. Testing did not reveal any pathogenic mutation in any of these genes.  A copy of the genetic test report will be scanned into Epic under the media tab.  The genes analyzed were the 46 genes on [facility]'s Common Cancers panel (APC, ATM, AXIN2, BARD1, BMPR1A, BRCA1, BRCA2, BRIP1, CDH1, CDKN2A, CHEK2, CTNNA1, DICER1, EPCAM, GREM1, HOXB13, KIT, MEN1, MLH1, MSH2, MSH3, MSH6, MUTYH, NBN, NF1, NTHL1, PALB2, PDGFRA, PMS2, POLD1, POLE, PTEN, RAD50, RAD51C, RAD51D, SDHA, SDHB, SDHC, SDHD, SMAD4, SMARCA4, STK11, TP53, TSC1, TSC2, VHL).  Since the current test is not perfect, it is possible that there may be a gene mutation that current testing cannot detect, but that chance is small. It is possible that a different genetic factor, which has not yet been discovered or is not on this panel, is responsible for the cancer diagnoses in the family. Again, the likelihood of this is low. No additional testing is recommended at this time for Briana Price.  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 This is still considered a normal result. While at this time, it is unknown if this finding is associated with increased cancer risk or not, the majority of these variants get reclassified to be inconsequential. We emphasized that  medical management should not be based on this finding. With time, we suspect the lab will determine the significance, if any. If we do learn more about it, we will try to contact Briana Price to discuss it further. It is important to stay in touch with [place] periodically and keep the address and phone number up to date.  Cancer Screening: These results suggest that Briana Price's cancer was most likely not due to an inherited predisposition. Most cancers happen by chance and this test, along with details of her family history, suggests that her cancer falls into this category. We discussed continuing to follow the cancer screening guidelines provided by her physician.   Family Members: Given the somewhat young age of breast cancer in Briana Price (age 51), women are recommended to speak with their own providers about having a yearly mammogram beginning at age 62, which is 10 years earlier than the youngest breast cancer in the family and discuss with their own provider whether there is a benefit to adding tomosynthesis to the mammogram and also having a breast MRI. Women are recommended to also have a clinical breast exam every 6-12 months, a yearly gynecologic exam and perform monthly breast self-exams. Colon cancer screening is recommended to begin by age 92 in both men and women, unless there is a family history of colon cancer or colon polyps or an individual has a personal history to warrant initiating screening at a younger age.  Any relative who had cancer at a young age or had a particularly rare cancer may  also wish to pursue genetic testing. Genetic counselors can be located in other cities, by visiting the website of the [employer] (Artist[employer].se) and Field seismologist for a Dietitian by zip code. Family members are not recommended to get tested for the above VUS outside of a research protocol as this finding has no implications for their medical management.    Lastly, cancer genetics  is a rapidly advancing field and it is possible that new genetic tests will be appropriate for Briana Price in the future. We encourage her to remain in contact with [place] on an annual basis so we can update her personal and family histories, and let her know of advances in cancer genetics that may benefit the family. Our contact number was provided. Briana Price is welcome to call anytime with additional questions.     Steele Berg, MS, [place] Certified Genetic Counselor phone: [phone]

## 2017-06-08 ENCOUNTER — Telehealth: Payer: Self-pay

## 2017-06-08 LAB — AEROBIC CULTURE

## 2017-06-08 NOTE — Telephone Encounter (Signed)
Pt had C2 adria cytoxan on Friday. She is having a hard time eating. No appetite. Some foods she can swallow and some foods she cannot. It is not a physical barrier, eg she could swallow broccoli but not chicken.  Discussed drinking ensure or boost, fruit and vegetable smoothies with added protein powder as alternatives.   She is drinking cranberry juice b/c water is loosing it's appeal. Water is tasting metalish. Made some suggestions for additive to help with taste of water. (cucumber, lemon, lime, grapefruit, diluted cranberry) the pt did know the goal is 64 oz per day.

## 2017-06-08 NOTE — Telephone Encounter (Signed)
Myrtle, please follow up in 2 days to see any improvement, make sure she is drinking liquid adequately. Thanks   [name] MD

## 2017-06-09 LAB — AEROBIC CULTURE

## 2017-06-10 ENCOUNTER — Telehealth: Payer: Self-pay | Admitting: *Deleted

## 2017-06-10 NOTE — Telephone Encounter (Signed)
Called pt to check on her today & she states she felt better energy wise yest than today but she has been doing some washing clothes.  She states that her taste is a little better & she is eating & drinking. She reports that her hair is coming out.  Discussed antinausea meds & encouraged fluids & high protein foods/supplements. She asked about FMLA.  Message left with Tiffany to call her.  She also asked for a letter for work regarding possible need to work less & to get a temporary closer (handicap) parking space at work.  Message to Dr Burr Medico.

## 2017-06-12 ENCOUNTER — Encounter: Payer: Self-pay | Admitting: Nurse Practitioner

## 2017-06-18 ENCOUNTER — Other Ambulatory Visit: Payer: Self-pay | Admitting: Hematology

## 2017-06-19 ENCOUNTER — Ambulatory Visit (HOSPITAL_BASED_OUTPATIENT_CLINIC_OR_DEPARTMENT_OTHER): Payer: Federal, State, Local not specified - PPO | Admitting: Nurse Practitioner

## 2017-06-19 ENCOUNTER — Other Ambulatory Visit (HOSPITAL_BASED_OUTPATIENT_CLINIC_OR_DEPARTMENT_OTHER): Payer: Federal, State, Local not specified - PPO

## 2017-06-19 ENCOUNTER — Encounter: Payer: Self-pay | Admitting: Nurse Practitioner

## 2017-06-19 ENCOUNTER — Ambulatory Visit (HOSPITAL_BASED_OUTPATIENT_CLINIC_OR_DEPARTMENT_OTHER): Payer: Federal, State, Local not specified - PPO

## 2017-06-19 ENCOUNTER — Ambulatory Visit: Payer: Federal, State, Local not specified - PPO

## 2017-06-19 VITALS — BP 137/85 | HR 86 | Temp 98.6°F | Resp 20 | Ht 66.0 in | Wt 213.2 lb

## 2017-06-19 DIAGNOSIS — Z171 Estrogen receptor negative status [ER-]: Principal | ICD-10-CM

## 2017-06-19 DIAGNOSIS — Z5111 Encounter for antineoplastic chemotherapy: Secondary | ICD-10-CM | POA: Diagnosis not present

## 2017-06-19 DIAGNOSIS — Z95828 Presence of other vascular implants and grafts: Secondary | ICD-10-CM

## 2017-06-19 DIAGNOSIS — C773 Secondary and unspecified malignant neoplasm of axilla and upper limb lymph nodes: Secondary | ICD-10-CM | POA: Diagnosis not present

## 2017-06-19 DIAGNOSIS — C50512 Malignant neoplasm of lower-outer quadrant of left female breast: Secondary | ICD-10-CM

## 2017-06-19 DIAGNOSIS — I10 Essential (primary) hypertension: Secondary | ICD-10-CM

## 2017-06-19 LAB — CBC WITH DIFFERENTIAL/PLATELET
BASO%: 0.8 % (ref 0.0–2.0)
Basophils Absolute: 0.2 10*3/uL — ABNORMAL HIGH (ref 0.0–0.1)
EOS ABS: 0 10*3/uL (ref 0.0–0.5)
EOS%: 0.2 % (ref 0.0–7.0)
HCT: 31.4 % — ABNORMAL LOW (ref 34.8–46.6)
HGB: 10.3 g/dL — ABNORMAL LOW (ref 11.6–15.9)
LYMPH%: 9.2 % — ABNORMAL LOW (ref 14.0–49.7)
MCH: 27 pg (ref 25.1–34.0)
MCHC: 32.8 g/dL (ref 31.5–36.0)
MCV: 82.4 fL (ref 79.5–101.0)
MONO#: 1.7 10*3/uL — ABNORMAL HIGH (ref 0.1–0.9)
MONO%: 8 % (ref 0.0–14.0)
NEUT%: 81.8 % — ABNORMAL HIGH (ref 38.4–76.8)
NEUTROS ABS: 17 10*3/uL — AB (ref 1.5–6.5)
NRBC: 0 % (ref 0–0)
PLATELETS: 220 10*3/uL (ref 145–400)
RBC: 3.81 10*6/uL (ref 3.70–5.45)
RDW: 13.9 % (ref 11.2–14.5)
WBC: 20.8 10*3/uL — AB (ref 3.9–10.3)
lymph#: 1.9 10*3/uL (ref 0.9–3.3)

## 2017-06-19 LAB — COMPREHENSIVE METABOLIC PANEL
ALT: 27 U/L (ref 0–55)
ANION GAP: 9 meq/L (ref 3–11)
AST: 26 U/L (ref 5–34)
Albumin: 3.6 g/dL (ref 3.5–5.0)
Alkaline Phosphatase: 79 U/L (ref 40–150)
BILIRUBIN TOTAL: 0.22 mg/dL (ref 0.20–1.20)
BUN: 5.9 mg/dL — ABNORMAL LOW (ref 7.0–26.0)
CALCIUM: 9 mg/dL (ref 8.4–10.4)
CO2: 28 mEq/L (ref 22–29)
CREATININE: 0.7 mg/dL (ref 0.6–1.1)
Chloride: 103 mEq/L (ref 98–109)
EGFR: >60 mL/min/{1.73_m2} (ref >60)
Glucose: 112 mg/dl (ref 70–140)
Potassium: 3.4 mEq/L — ABNORMAL LOW (ref 3.5–5.1)
Sodium: 140 mEq/L (ref 136–145)
TOTAL PROTEIN: 6.9 g/dL (ref 6.4–8.3)

## 2017-06-19 MED ORDER — PALONOSETRON HCL INJECTION 0.25 MG/5ML
0.2500 mg | Freq: Once | INTRAVENOUS | Status: AC
  Administered 2017-06-19: 0.25 mg via INTRAVENOUS

## 2017-06-19 MED ORDER — SODIUM CHLORIDE 0.9% FLUSH
10.0000 mL | INTRAVENOUS | Status: DC | PRN
  Administered 2017-06-19: 10 mL via INTRAVENOUS
  Filled 2017-06-19: qty 10

## 2017-06-19 MED ORDER — HEPARIN SOD (PORK) LOCK FLUSH 100 UNIT/ML IV SOLN
500.0000 [IU] | Freq: Once | INTRAVENOUS | Status: AC | PRN
  Administered 2017-06-19: 500 [IU]
  Filled 2017-06-19: qty 5

## 2017-06-19 MED ORDER — SODIUM CHLORIDE 0.9 % IV SOLN
Freq: Once | INTRAVENOUS | Status: AC
  Administered 2017-06-19: 15:00:00 via INTRAVENOUS
  Filled 2017-06-19: qty 5

## 2017-06-19 MED ORDER — SODIUM CHLORIDE 0.9 % IV SOLN
Freq: Once | INTRAVENOUS | Status: AC
  Administered 2017-06-19: 14:00:00 via INTRAVENOUS

## 2017-06-19 MED ORDER — SODIUM CHLORIDE 0.9% FLUSH
10.0000 mL | INTRAVENOUS | Status: DC | PRN
  Administered 2017-06-19: 10 mL
  Filled 2017-06-19: qty 10

## 2017-06-19 MED ORDER — PEGFILGRASTIM 6 MG/0.6ML ~~LOC~~ PSKT
6.0000 mg | PREFILLED_SYRINGE | Freq: Once | SUBCUTANEOUS | Status: DC
  Filled 2017-06-19: qty 0.6

## 2017-06-19 MED ORDER — SODIUM CHLORIDE 0.9 % IV SOLN
600.0000 mg/m2 | Freq: Once | INTRAVENOUS | Status: AC
  Administered 2017-06-19: 1300 mg via INTRAVENOUS
  Filled 2017-06-19: qty 65

## 2017-06-19 MED ORDER — PALONOSETRON HCL INJECTION 0.25 MG/5ML
INTRAVENOUS | Status: AC
  Filled 2017-06-19: qty 5

## 2017-06-19 MED ORDER — DOXORUBICIN HCL CHEMO IV INJECTION 2 MG/ML
60.0000 mg/m2 | Freq: Once | INTRAVENOUS | Status: AC
  Administered 2017-06-19: 130 mg via INTRAVENOUS
  Filled 2017-06-19: qty 65

## 2017-06-19 NOTE — Progress Notes (Signed)
[place]  [phone] [phone]  Clinic Follow up Note   Patient Care Team: Briana Caraway, MD as PCP - General (Family Medicine) Briana Merle, MD as Consulting Physician (Hematology) Briana Klein, MD as Consulting Physician (General Surgery) Briana Rudd, MD as Consulting Physician (Radiation Oncology) 06/19/2017  SUMMARY OF ONCOLOGIC HISTORY: Oncology History   Cancer Staging Malignant neoplasm of lower-outer quadrant of left breast of female, estrogen receptor negative ([place]) Staging form: Breast, AJCC 8th Edition - Clinical: Stage IIB (cT2, cN0(f), cM0, G3, ER: Negative, PR: Negative, HER2: Negative) - Unsigned       Malignant neoplasm of lower-outer quadrant of left breast of female, estrogen receptor negative ([place])   04/27/2017 Mammogram    IMPRESSION: 1. Highly suspicious mass within the LEFT breast at the 5:30 o'clock axis, 4 cm from the nipple, measuring 2.5 cm, corresponding to the mammographic finding and corresponding to 1 of the palpable lumps in the left breast. Ultrasound-guided biopsy is recommended. 2. Mildly prominent lymph node in the LEFT axilla, with normal cortical thickness but with questionable effacement of the fatty hilum. Ultrasound-guided core biopsy is recommended. 3. No evidence of malignancy within the RIGHT breast. Also, no evidence of malignancy within the second area of clinical concern in the upper outer left breast.      04/29/2017 Initial Diagnosis    Malignant neoplasm of lower-outer quadrant of left breast of female, estrogen receptor negative ([place])      04/29/2017 Receptors her2    Estrogen Receptor: 0%, NEGATIVE Progesterone Receptor: 0%, NEGATIVE Proliferation Marker Ki67: 90% HER2 NEGATIVE       04/29/2017 Initial Biopsy     Diagnosis 1. 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 - SEE COMMENT. 2. Lymph node,  needle/core biopsy, left axilla - THERE IN NO EVIDENCE OF CARCINOMA IN 1 OF 1 LYMPH NODE (0/1).      05/14/2017 Imaging    MRI of Breast Bilateral 05/14/17  IMPRESSION: 1. 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 2. Non mass enhancement extending approximately 3.2 cm anterior to the mass in the lower inner quadrant of the left breast, suspicious for DCIS. There is no correlate on recent mammography. The overall extent of the mass and non mass enhancement is approximately 5 cm. 3. No MRI evidence of malignancy involving the right breast. 4. No pathologic lymphadenopathy. Upper normal sized left axillary lymph nodes, the largest of which was previously biopsied no evidence of metastatic disease.      05/15/2017 Imaging    Bone scan whole body 05/15/17 IMPRESSION: No findings specific for metastatic disease to bone.      05/15/2017 Imaging    CT CAP W contrast 05/15/17  IMPRESSION: 1. 18 mm soft tissue lesion inferior left breast compatible with known primary malignancy. 2. Mildly enlarged left axillary lymph nodes in this patient with biopsy-proven metastatic disease to the left axilla. 3. No evidence for lymphadenopathy elsewhere in the chest. No lymphadenopathy in the abdomen or pelvis. No evidence for metastatic disease in the abdomen or pelvis. 4. Tiny right perifissural lung nodule most likely subpleural lymph node. Attention on follow-up recommended. 5. 7 mm hypoattenuating lesion in the spleen, likely a cyst or pseudocyst. Attention on follow-up recommended. 6. Uterine fibroids. 7.  Aortic Atherosclerois (ICD10-170.0)      05/19/2017 Procedure    Port placement by  Dr. Donell Price on 9/11/8      05/22/2017 -  Chemotherapy    neoadjuvant chemotherpy AC every 2 weeks for 4 cycles, followed by paclitaxel every 2 weeks for 4 cycles starting 05/22/17        05/22/2017  Pathology Results    Diagnosis Breast, left, needle core biopsy, lower inner quad - DUCTAL CARCINOMA IN SITU. ER 0%, NEGATIVE PR 0%, NEGATIVE      CURRENT THERAPY: neoadjuvant chemotherpy AC every 2 weeks for 4 cycles, followed by weekly paclitaxel with or without carboplatin for 12 cycles starting 05/22/17  INTERVAL HISTORY: Briana Price returns today for follow up as scheduled. She received cycle 2 AC with onpro on 05/28/2017. She tolerated well but notes longer recovery time from cycle 1. Her fatigue lasted approximately 1 week; she was not able to go back to work until one week following treatment. She had one episode of vomiting due to a coughing fit, otherwise nausea is well controlled with PRN medication. She has moderate bone pain with neulasta, overall tolerable. She notes a metallic taste to water but continues to drink plenty of liquids. She had a skin wound at her Port-A-Cath site and folliculitis to upper front chest treated with Bactrim DS 10 days.this has resolved.she denies fever or chills.   REVIEW OF SYSTEMS:   Constitutional: Denies fevers, chills or abnormal weight loss (+) mild fatigue  Eyes: Denies blurriness of vision Ears, nose, mouth, throat, and face: Denies mucositis or sore throat Respiratory: Denies cough, dyspnea or wheezes Cardiovascular: Denies palpitation, chest discomfort or lower extremity swelling Gastrointestinal:  Denies heartburn or change in bowel habits (+) mild nausea, controlled with when necessary medication Skin: Denies abnormal skin rashes (+) skin wound to right chest PAC, improved Lymphatics: Denies new lymphadenopathy or easy bruising Neurological:Denies numbness, tingling or new weaknesses Behavioral/Psych: Mood is stable, no new changes  All other systems were reviewed with the patient and are negative.  MEDICAL HISTORY:  Past Medical History:  Diagnosis Date  . Anemia   . Breast cancer (HCC)   . Cancer [facility])    left breast cancer  .  Family history of breast cancer   . Genetic testing 05/28/2017   Common Cancers panel (46 genes) @ [facility] - No pathogenic mutations detected  . GERD (gastroesophageal reflux disease)   . Headache    Migraines  . Hypertension     SURGICAL HISTORY: Past Surgical History:  Procedure Laterality Date  . BREAST SURGERY     biopsy  . CESAREAN SECTION    . CHOLECYSTECTOMY N/A 02/07/2013   Procedure: LAPAROSCOPIC CHOLECYSTECTOMY WITH INTRAOPERATIVE CHOLANGIOGRAM;  Surgeon: Cherylynn Ridges, MD;  Location: [facility];  Service: General;  Laterality: N/A;  . COLONOSCOPY W/ BIOPSIES AND POLYPECTOMY    . FOOT SURGERY  2003   lt foot bunionectomy  . MULTIPLE TOOTH EXTRACTIONS    . PORTACATH PLACEMENT Right 05/19/2017   Procedure: INSERTION PORT-A-CATH;  Surgeon: Almond Lint, MD;  Location: [facility] OR;  Service: General;  Laterality: Right;    I have reviewed the social history and family history with the patient and they are unchanged from previous note.  ALLERGIES:  has No Known Allergies.  MEDICATIONS:  Current Outpatient Prescriptions  Medication Sig Dispense Refill  . aspirin-acetaminophen-caffeine (EXCEDRIN MIGRAINE) 250-250-65 MG tablet Take 2 tablets by mouth 2 (two) times daily as needed for headache.    . Cholecalciferol (VITAMIN D) 2000 units CAPS Take 2,000 Units by mouth daily.    Marland Kitchen  ciprofloxacin (CILOXAN) 0.3 % ophthalmic solution     . fluocinonide ointment (LIDEX) 3.87 % Apply 1 application topically daily as needed (rosacea).    . hydrochlorothiazide (HYDRODIURIL) 25 MG tablet Take 25 mg by mouth daily.    Marland Kitchen lidocaine-prilocaine (EMLA) cream Apply to affected area once 30 g 3  . lisinopril (PRINIVIL,ZESTRIL) 10 MG tablet Take 10 mgs by mouth daily  2  . ondansetron (ZOFRAN) 8 MG tablet Take 1 tablet (8 mg total) by mouth 2 (two) times daily as needed. Start on the third day after chemotherapy. 30 tablet 1  . oxyCODONE (OXY IR/ROXICODONE) 5 MG immediate release tablet  Take 1-2 tablets (5-10 mg total) by mouth every 6 (six) hours as needed for moderate pain, severe pain or breakthrough pain. 20 tablet 0  . prochlorperazine (COMPAZINE) 10 MG tablet Take 1 tablet (10 mg total) by mouth every 6 (six) hours as needed (Nausea or vomiting). 30 tablet 1   No current facility-administered medications for this visit.    Facility-Administered Medications Ordered in Other Visits  Medication Dose Route Frequency Provider Last Rate Last Dose  . cyclophosphamide (CYTOXAN) 1,300 mg in sodium chloride 0.9 % 250 mL chemo infusion  600 mg/m2 (Treatment Plan Recorded) Intravenous Once Briana Merle, MD      . DOXOrubicin (ADRIAMYCIN) chemo injection 130 mg  60 mg/m2 (Treatment Plan Recorded) Intravenous Once Briana Merle, MD      . fosaprepitant (EMEND) 150 mg, dexamethasone (DECADRON) 12 mg in sodium chloride 0.9 % 145 mL IVPB   Intravenous Once Briana Merle, MD 454 mL/hr at 06/19/17 1448    . heparin lock flush 100 unit/mL  500 Units Intracatheter Once PRN Briana Merle, MD      . pegfilgrastim (NEULASTA ONPRO KIT) injection 6 mg  6 mg Subcutaneous Once Briana Merle, MD      . sodium chloride flush (NS) 0.9 % injection 10 mL  10 mL Intracatheter PRN Briana Merle, MD        PHYSICAL EXAMINATION: ECOG PERFORMANCE STATUS: 1 - Symptomatic but completely ambulatory  Vitals:   06/19/17 1323  BP: 137/85  Pulse: 86  Resp: 20  Temp: 98.6 F (37 C)  SpO2: 100%   Filed Weights   06/19/17 1323  Weight: 213 lb 3.2 oz (96.7 kg)    GENERAL:alert, no distress and comfortable SKIN: skin color, texture, turgor are normal, no rashes or significant lesions. PAC is accessed, insertion site is covered with guaze dressing EYES: normal, Conjunctiva are pink and non-injected, sclera clear OROPHARYNX:no exudate, no erythema and lips, buccal mucosa, and tongue normal  NECK: supple, thyroid normal size, non-tender, without nodularity LYMPH:  no palpable cervical, supraclavicular, axillary, or inguinal  lymphadenopathy  LUNGS: clear to auscultation bilaterally with normal breathing effort HEART: regular rate & rhythm and no murmurs and no lower extremity edema ABDOMEN:abdomen soft, non-tender and normal bowel sounds. No palpable hepatosplenomegaly or masses  Musculoskeletal:no cyanosis of digits and no clubbing  NEURO: alert & oriented x 3 with fluent speech, no focal motor/sensory deficits Breast: left breast: no palpable axillary mass. (+) palpable mass in 7:00 position of lower inner quadrant measuring 2.5 x3.0 cm. Unchanged from previous exam. No palpable mass in right breast or axilla.   LABORATORY DATA:  I have reviewed the data as listed CBC Latest Ref Rng & Units 06/19/2017 06/05/2017 05/22/2017  WBC 3.9 - 10.3 10e3/uL 20.8(H) 14.8(H) 9.7  Hemoglobin 11.6 - 15.9 g/dL 10.3(L) 11.3(L) 11.2(L)  Hematocrit 34.8 - 46.6 % 31.4(L)  33.9(L) 34.8  Platelets 145 - 400 10e3/uL 220 262 377     CMP Latest Ref Rng & Units 06/19/2017 06/05/2017 05/22/2017  Glucose 70 - 140 mg/dl 112 164(H) 91  BUN 7.0 - 26.0 mg/dL 5.9(L) 5.2(L) 11.0  Creatinine 0.6 - 1.1 mg/dL 0.7 0.7 0.7  Sodium 136 - 145 mEq/L 140 138 137  Potassium 3.5 - 5.1 mEq/L 3.4(L) 3.7 3.6  Chloride 96 - 112 mEq/L - - -  CO2 22 - 29 mEq/L '28 27 27  '$ Calcium 8.4 - 10.4 mg/dL 9.0 9.8 9.4  Total Protein 6.4 - 8.3 g/dL 6.9 7.5 7.2  Total Bilirubin 0.20 - 1.20 mg/dL 0.22 0.25 0.62  Alkaline Phos 40 - 150 U/L 79 91 58  AST 5 - 34 U/L '26 23 15  '$ ALT 0 - 55 U/L '27 20 12      '$ RADIOGRAPHIC STUDIES: I have personally reviewed the radiological images as listed and agreed with the findings in the report. No results found.   ASSESSMENT & PLAN: KENIDY CROSSLAND is a 51 y.o. african-american female with a history of HTN and arthritis in her back, presented with a palpable left breast mass.  1. Malignant neoplasm of lower-outer quadrant of left breast of female, invasive ductal carcinoma, c2T0M0, Stage IIB, ER/PR: negative, HER2: negative,  Grade 3.  2. Left breast DCIS, ER 0%, negative, PR 0%, negative 3. Genetics 4. HTN 5. Rosacea 6. Skin wound at [facility] site and folliculitis in front upper chest  Ms. Highley appears stable today, her VS are WNL, weight is stable. She appears to have recovered well from cycle 2 with moderate fatigue. Her skin wound has resolved with completion of bactrim DS x10 days. Her labs indicate leukocytosis, secondary to neulasta. We will hold this with cycle 3 after review with Dr. Benay Spice. I reviewed fever and bleeding precautions. I briefly reviewed genetic test results indicate no pathologic mutation in genes that were tested, I encouraged her to meet with our genetic counselor to discuss further. She will proceed with cycle 3 AC today.   PLAN: -Lab reviewed, OK to proceed with cycle 3 AC, hold neulasta -return for cycle 4 in 2 weeks -meet/discuss results with genetic counselor   All questions were answered. The patient knows to call the clinic with any problems, questions or concerns. No barriers to learning was detected. I spent 25 counseling the patient face to face. The total time spent in the appointment was 30 and more than 50% was on counseling and review of test results     Alla Feeling, NP 06/19/17

## 2017-06-19 NOTE — Progress Notes (Signed)
Per Cira Rue NP No neulasta on pro today. Pt aware and verbalizes understanding.

## 2017-06-19 NOTE — Patient Instructions (Signed)
[facility] Discharge Instructions for Patients Receiving Chemotherapy  Today you received the following chemotherapy agents Adriamycin and Cytoxan  To help prevent nausea and vomiting after your treatment, we encourage you to take your nausea medication as directed.  If you develop nausea and vomiting that is not controlled by your nausea medication, call the clinic.   BELOW ARE SYMPTOMS THAT SHOULD BE REPORTED IMMEDIATELY:  *FEVER GREATER THAN 100.5 F  *CHILLS WITH OR WITHOUT FEVER  NAUSEA AND VOMITING THAT IS NOT CONTROLLED WITH YOUR NAUSEA MEDICATION  *UNUSUAL SHORTNESS OF BREATH  *UNUSUAL BRUISING OR BLEEDING  TENDERNESS IN MOUTH AND THROAT WITH OR WITHOUT PRESENCE OF ULCERS  *URINARY PROBLEMS  *BOWEL PROBLEMS  UNUSUAL RASH Items with * indicate a potential emergency and should be followed up as soon as possible.  Feel free to call the clinic should you have any questions or concerns. The clinic phone number is [phone].  Please show the CHEMO ALERT CARD at check-in to the Emergency Department and triage nurse.   

## 2017-06-24 ENCOUNTER — Other Ambulatory Visit: Payer: Federal, State, Local not specified - PPO

## 2017-06-24 ENCOUNTER — Encounter: Payer: Federal, State, Local not specified - PPO | Admitting: Genetic Counselor

## 2017-06-30 DIAGNOSIS — Z6834 Body mass index (BMI) 34.0-34.9, adult: Secondary | ICD-10-CM | POA: Diagnosis not present

## 2017-06-30 DIAGNOSIS — Z01419 Encounter for gynecological examination (general) (routine) without abnormal findings: Secondary | ICD-10-CM | POA: Diagnosis not present

## 2017-06-30 DIAGNOSIS — Z1151 Encounter for screening for human papillomavirus (HPV): Secondary | ICD-10-CM | POA: Diagnosis not present

## 2017-07-02 ENCOUNTER — Telehealth: Payer: Self-pay | Admitting: Hematology

## 2017-07-02 NOTE — Telephone Encounter (Signed)
Called and spoke with patient [phone] at 9:09 am on 07/02/2017 to let her know her FMLA papers were completed.  Briana Price stated she has an appointment in the morning and would like to pick them up at the receptionist area.  Walked them down and gave them to Ms. Wilma in an envelope with patient's name labeled on front.  Have also prepped for Batch Level scanning.

## 2017-07-03 ENCOUNTER — Ambulatory Visit (HOSPITAL_BASED_OUTPATIENT_CLINIC_OR_DEPARTMENT_OTHER): Payer: Federal, State, Local not specified - PPO | Admitting: Hematology

## 2017-07-03 ENCOUNTER — Telehealth: Payer: Self-pay | Admitting: Hematology

## 2017-07-03 ENCOUNTER — Ambulatory Visit: Payer: Federal, State, Local not specified - PPO

## 2017-07-03 ENCOUNTER — Ambulatory Visit (HOSPITAL_BASED_OUTPATIENT_CLINIC_OR_DEPARTMENT_OTHER): Payer: Federal, State, Local not specified - PPO

## 2017-07-03 ENCOUNTER — Other Ambulatory Visit (HOSPITAL_BASED_OUTPATIENT_CLINIC_OR_DEPARTMENT_OTHER): Payer: Federal, State, Local not specified - PPO

## 2017-07-03 VITALS — BP 135/95 | HR 76 | Temp 98.0°F | Resp 20 | Ht 66.0 in | Wt 210.9 lb

## 2017-07-03 DIAGNOSIS — Z171 Estrogen receptor negative status [ER-]: Secondary | ICD-10-CM

## 2017-07-03 DIAGNOSIS — I10 Essential (primary) hypertension: Secondary | ICD-10-CM

## 2017-07-03 DIAGNOSIS — C50512 Malignant neoplasm of lower-outer quadrant of left female breast: Secondary | ICD-10-CM | POA: Diagnosis not present

## 2017-07-03 DIAGNOSIS — Z95828 Presence of other vascular implants and grafts: Secondary | ICD-10-CM

## 2017-07-03 LAB — CBC WITH DIFFERENTIAL/PLATELET
BASO%: 4.4 % — ABNORMAL HIGH (ref 0.0–2.0)
BASOS ABS: 0.1 10*3/uL (ref 0.0–0.1)
EOS ABS: 0 10*3/uL (ref 0.0–0.5)
EOS%: 1 % (ref 0.0–7.0)
HCT: 30.1 % — ABNORMAL LOW (ref 34.8–46.6)
HGB: 9.8 g/dL — ABNORMAL LOW (ref 11.6–15.9)
LYMPH%: 31.4 % (ref 14.0–49.7)
MCH: 27.1 pg (ref 25.1–34.0)
MCHC: 32.6 g/dL (ref 31.5–36.0)
MCV: 83.1 fL (ref 79.5–101.0)
MONO#: 1.1 10*3/uL — ABNORMAL HIGH (ref 0.1–0.9)
MONO%: 35.6 % — AB (ref 0.0–14.0)
NEUT#: 0.9 10*3/uL — ABNORMAL LOW (ref 1.5–6.5)
NEUT%: 27.6 % — AB (ref 38.4–76.8)
NRBC: 0 % (ref 0–0)
PLATELETS: 341 10*3/uL (ref 145–400)
RBC: 3.62 10*6/uL — AB (ref 3.70–5.45)
RDW: 15.2 % — ABNORMAL HIGH (ref 11.2–14.5)
WBC: 3.2 10*3/uL — ABNORMAL LOW (ref 3.9–10.3)
lymph#: 1 10*3/uL (ref 0.9–3.3)

## 2017-07-03 LAB — COMPREHENSIVE METABOLIC PANEL
ALT: 25 U/L (ref 0–55)
ANION GAP: 9 meq/L (ref 3–11)
AST: 26 U/L (ref 5–34)
Albumin: 3.6 g/dL (ref 3.5–5.0)
Alkaline Phosphatase: 54 U/L (ref 40–150)
BILIRUBIN TOTAL: 0.23 mg/dL (ref 0.20–1.20)
BUN: 5.3 mg/dL — ABNORMAL LOW (ref 7.0–26.0)
CO2: 27 meq/L (ref 22–29)
Calcium: 9.3 mg/dL (ref 8.4–10.4)
Chloride: 104 mEq/L (ref 98–109)
Creatinine: 0.6 mg/dL (ref 0.6–1.1)
Glucose: 105 mg/dl (ref 70–140)
POTASSIUM: 3.3 meq/L — AB (ref 3.5–5.1)
SODIUM: 140 meq/L (ref 136–145)
TOTAL PROTEIN: 7 g/dL (ref 6.4–8.3)

## 2017-07-03 MED ORDER — SODIUM CHLORIDE 0.9% FLUSH
10.0000 mL | INTRAVENOUS | Status: DC | PRN
Start: 2017-07-03 — End: 2017-07-03
  Administered 2017-07-03: 10 mL via INTRAVENOUS
  Filled 2017-07-03: qty 10

## 2017-07-03 NOTE — Progress Notes (Signed)
[place]  [phone] Fax:(336) [phone]  Clinic follow up Note   Patient Care Team: [name], MD as PCP - General (Family Medicine) [name], MD as Consulting Physician (Hematology) [name], MD as Consulting Physician (General Surgery) [name], MD as Consulting Physician (Radiation Oncology) 07/03/2017  CHIEF COMPLAINTS:  Malignant neoplasm of lower-outer quadrant of left breast of female, triple negative   Oncology History   Cancer Staging Malignant neoplasm of lower-outer quadrant of left breast of female, estrogen receptor negative ([place]) Staging form: Breast, AJCC 8th Edition - Clinical: Stage IIB (cT2, cN0(f), cM0, G3, ER: Negative, PR: Negative, HER2: Negative) - Unsigned       Malignant neoplasm of lower-outer quadrant of left breast of female, estrogen receptor negative ([place])   04/27/2017 Mammogram    IMPRESSION: 1. Highly suspicious mass within the LEFT breast at the 5:30 o'clock axis, 4 cm from the nipple, measuring 2.5 cm, corresponding to the mammographic finding and corresponding to 1 of the palpable lumps in the left breast. Ultrasound-guided biopsy is recommended. 2. Mildly prominent lymph node in the LEFT axilla, with normal cortical thickness but with questionable effacement of the fatty hilum. Ultrasound-guided core biopsy is recommended. 3. No evidence of malignancy within the RIGHT breast. Also, no evidence of malignancy within the second area of clinical concern in the upper outer left breast.      04/29/2017 Initial Diagnosis    Malignant neoplasm of lower-outer quadrant of left breast of female, estrogen receptor negative ([place])      04/29/2017 Receptors her2    Estrogen Receptor: 0%, NEGATIVE Progesterone Receptor: 0%, NEGATIVE Proliferation Marker Ki67: 90% HER2 NEGATIVE       04/29/2017 Initial Biopsy     Diagnosis 1. 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 - SEE COMMENT. 2. Lymph node, needle/core biopsy, left axilla - THERE IN NO EVIDENCE OF CARCINOMA IN 1 OF 1 LYMPH NODE (0/1).      05/14/2017 Imaging    MRI of Breast Bilateral 05/14/17  IMPRESSION: 1. 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 2. Non mass enhancement extending approximately 3.2 cm anterior to the mass in the lower inner quadrant of the left breast, suspicious for DCIS. There is no correlate on recent mammography. The overall extent of the mass and non mass enhancement is approximately 5 cm. 3. No MRI evidence of malignancy involving the right breast. 4. No pathologic lymphadenopathy. Upper normal sized left axillary lymph nodes, the largest of which was previously biopsied no evidence of metastatic disease.      05/15/2017 Imaging    Bone scan whole body 05/15/17 IMPRESSION: No findings specific for metastatic disease to bone.      05/15/2017 Imaging    CT CAP W contrast 05/15/17  IMPRESSION: 1. 18 mm soft tissue lesion inferior left breast compatible with known primary malignancy. 2. Mildly enlarged left axillary lymph nodes in this patient with biopsy-proven metastatic disease to the left axilla. 3. No evidence for lymphadenopathy elsewhere in the chest. No lymphadenopathy in the abdomen or pelvis. No evidence for metastatic disease in the abdomen or pelvis. 4. Tiny right perifissural lung nodule most likely subpleural lymph node. Attention on follow-up recommended. 5. 7 mm hypoattenuating lesion in the spleen, likely a cyst or pseudocyst. Attention on follow-up recommended. 6. Uterine fibroids. 7.  Aortic Atherosclerois (ICD10-170.0)  05/19/2017 Procedure    Port placement by Dr. [name] on 9/11/8      05/22/2017 -  Chemotherapy    neoadjuvant chemotherpy AC every 2 weeks for 4 cycles, followed by  paclitaxel every 2 weeks for 4 cycles starting 05/22/17        05/22/2017 Pathology Results    Diagnosis Breast, left, needle core biopsy, lower inner quad - DUCTAL CARCINOMA IN SITU. 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  The genes analyzed were the 46 genes on [facility]'s Common Cancers panel (APC, ATM, AXIN2, BARD1, BMPR1A, BRCA1, BRCA2, BRIP1, CDH1, CDKN2A, CHEK2, CTNNA1, DICER1, EPCAM, GREM1, HOXB13, KIT, MEN1, MLH1, MSH2, MSH3, MSH6, MUTYH, NBN, NF1, NTHL1, PALB2, PDGFRA, PMS2, POLD1, POLE, PTEN, RAD50, RAD51C, RAD51D, SDHA, SDHB, SDHC, SDHD, SMAD4, SMARCA4, STK11, TP53, TSC1, TSC2, VHL).        HISTORY OF PRESENTING ILLNESS: 8/29/8  [name] 51 y.o. 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  She presents to the clinic today with her mother and father.  She first noticed her a mass in her left breast on the 17th of August, It was a hard knot, no pain or tenderness. Her mammogram form 03/24/17 and other previous mammograms were all normal.   In the past she was diagnosed with HTN and takes hydrochlorothiazide and lisinopril. She had her gallbladder removed. Her maternal grandmother, maternal aunt and paternal aunt had breast cancer. 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 The pain can last up to 3 days.   Today she denies skin change around breast, pain, fatigue or weight loss. She still has regular periods that last 3 days. She work in [employer] for the [employer].   GYN HISTORY  Menarchal: 14 LMP: 05/01/17 Contraceptive: No HRT: n/a GP: G1P1 at age 54, she has a son  CURRENT THERAPY: neoadjuvant chemotherpy AC every 2 weeks for 4 cycles starting 05/22/17, followed by weekly paclitaxel for 12 cycles starting 07/24/17  INTERVAL HISTORY:  [name] is here for a follow up and cycle 4 of AC. She  presents to the clinic today with her family.  She notes her last treatment went well and she feels less fatigued and faster recovery this time due to not having Onpro. This week she did experience some joint pain in her hand once. She denies any change in bowel movements.   She went to her GYN, Dr. Garwin Brothers, for annual exam and was told he had a heart murmur, she has not experienced chest pain, tightness, or SOB. Her ECHO from 05/14/17 showed no valve issues.   When doing her own breast exam she does not feel her previous lump, but was palpated by her GYN.      MEDICAL HISTORY:  Past Medical History:  Diagnosis Date  . Anemia   . Breast cancer ([place])   . Cancer [facility])    left breast cancer  . Family history of breast cancer   . Genetic testing 05/28/2017   Common Cancers panel (46 genes) @ [facility] - No pathogenic mutations detected  . GERD (gastroesophageal reflux disease)   . Headache    Migraines  . Hypertension    SURGICAL HISTORY: Past Surgical History:  Procedure Laterality Date  . BREAST SURGERY     biopsy  . CESAREAN SECTION    . CHOLECYSTECTOMY N/A  02/07/2013   Procedure: LAPAROSCOPIC CHOLECYSTECTOMY WITH INTRAOPERATIVE CHOLANGIOGRAM;  Surgeon: Gwenyth Ober, MD;  Location: [facility];  Service: General;  Laterality: N/A;  . COLONOSCOPY W/ BIOPSIES AND POLYPECTOMY    . FOOT SURGERY  2003   lt foot bunionectomy  . MULTIPLE TOOTH EXTRACTIONS    . PORTACATH PLACEMENT Right 05/19/2017   Procedure: INSERTION PORT-A-CATH;  Surgeon: [name], MD;  Location: [facility];  Service: General;  Laterality: Right;   SOCIAL HISTORY: Social History   Social History  . Marital status: Single    Spouse name: N/A  . Number of children: N/A  . Years of education: N/A   Occupational History  . Not on file.   Social History Main Topics  . Smoking status: Former Smoker    Types: Cigarettes  . Smokeless tobacco: Never Used     Comment: 15-20 years ago  . Alcohol use No    . Drug use: No  . Sexual activity: Not on file   Other Topics Concern  . Not on file   Social History Narrative   Tobacco use   Cigarettes: Never smoked    Tobacco history last updated 01/23/2014   No smoking   No tobacco exposure   No alcohol   Caffeine : yes soda   No recreational drug use   Occupation :employed ,[employer] 01/25/2013   Martial status : single    Children: Louie Casa 01/25/2013   12 year old son   FAMILY HISTORY: Family History  Problem Relation Age of Onset  . Breast cancer Maternal Grandmother        dx 15s; deceased 45  . Breast cancer Paternal Aunt 51       recurrence vs. 2nd primary at 73; currently 101  . Breast cancer Maternal Aunt 62       deceased 50  . Hypertension Mother   . Breast cancer Other        pat grandfather's sister; dx 30s  . Leukemia Paternal Uncle        deceased 43  . Breast cancer Other        paternal distant cousin; dx 72s   ALLERGIES:  has No Known Allergies.  MEDICATIONS:  Current Outpatient Prescriptions  Medication Sig Dispense Refill  . aspirin-acetaminophen-caffeine (EXCEDRIN MIGRAINE) 250-250-65 MG tablet Take 2 tablets by mouth 2 (two) times daily as needed for headache.    . Cholecalciferol (VITAMIN D) 2000 units CAPS Take 2,000 Units by mouth daily.    . ciprofloxacin (CILOXAN) 0.3 % ophthalmic solution     . fluocinonide ointment (LIDEX) 4.09 % Apply 1 application topically daily as needed (rosacea).    . hydrochlorothiazide (HYDRODIURIL) 25 MG tablet Take 25 mg by mouth daily.    Marland Kitchen lidocaine-prilocaine (EMLA) cream Apply to affected area once 30 g 3  . lisinopril (PRINIVIL,ZESTRIL) 10 MG tablet Take 10 mgs by mouth daily  2  . ondansetron (ZOFRAN) 8 MG tablet Take 1 tablet (8 mg total) by mouth 2 (two) times daily as needed. Start on the third day after chemotherapy. 30 tablet 1  . oxyCODONE (OXY IR/ROXICODONE) 5 MG immediate release tablet Take 1-2 tablets (5-10 mg total) by mouth every 6 (six) hours as  needed for moderate pain, severe pain or breakthrough pain. 20 tablet 0  . prochlorperazine (COMPAZINE) 10 MG tablet Take 1 tablet (10 mg total) by mouth every 6 (six) hours as needed (Nausea or vomiting). 30 tablet 1   No  current facility-administered medications for this visit.    REVIEW OF SYSTEMS:   Constitutional: Denies fevers, chills or abnormal night sweats (+) fatigue  Eyes: Denies blurriness of vision, double vision or watery eyes Ears, nose, mouth, throat, and face: Denies mucositis or sore throat Respiratory: Denies cough, dyspnea or wheezes Cardiovascular: Denies palpitation, chest discomfort or lower extremity swelling (+) mild heart murmur Gastrointestinal:  Denies nausea, heartburn or change in bowel habits Skin: Denies abnormal skin rashes Lymphatics: Denies new lymphadenopathy or easy bruising Neurological:Denies numbness, tingling or new weaknesses MSK: (+) joint pain in hand  Behavioral/Psych: Mood is stable, no new changes. Breast: negative All other systems were reviewed with the patient and are negative.  PHYSICAL EXAMINATION: ECOG PERFORMANCE STATUS: 0 - Asymptomatic BP (!) 135/95 (BP Location: Right Arm, Patient Position: Sitting)   Pulse 76   Temp 98 F (36.7 C) (Oral)   Resp 20   Ht _0  (1.676 m)   Wt 210 lb 14.4 oz (95.7 kg)   SpO2 100%   BMI 34.04 kg/m   Vitals:   07/03/17 1353  BP: (!) 135/95  Pulse: 76  Resp: 20  Temp: 98 F (36.7 C)  TempSrc: Oral  SpO2: 100%  Weight: 210 lb 14.4 oz (95.7 kg)  Height: _1  (1.676 m)    GENERAL:alert, no distress and comfortable SKIN: skin color, texture, turgor are normal, no rashes or significant lesions EYES: normal, conjunctiva are pink and non-injected, sclera clear OROPHARYNX:no exudate, no erythema and lips, buccal mucosa, and tongue normal  NECK: supple, thyroid normal size, non-tender, without nodularity LYMPH:  no palpable lymphadenopathy in the cervical, axillary or inguinal LUNGS: clear  to auscultation and percussion with normal breathing effort HEART: regular rate and no lower extremity edema (+) mild Heart murmur, related to anemia  ABDOMEN:abdomen soft, non-tender and normal bowel sounds Musculoskeletal:no cyanosis of digits and no clubbing  PSYCH: alert & oriented x 3 with fluent speech NEURO: no focal motor/sensory deficits Breast: (+) no palpable mass in left axilla (+) Inner lower quadrant mass in 7:00 position of left breast is no longer palpable (+) no palpable mass in right breast or right axilla, normal   LABORATORY DATA:  I have reviewed the data as listed CBC Latest Ref Rng & Units 07/03/2017 06/19/2017 06/05/2017  WBC 3.9 - 10.3 10e3/uL 3.2(L) 20.8(H) 14.8(H)  Hemoglobin 11.6 - 15.9 g/dL 9.8(L) 10.3(L) 11.3(L)  Hematocrit 34.8 - 46.6 % 30.1(L) 31.4(L) 33.9(L)  Platelets 145 - 400 10e3/uL 341 220 262   CMP Latest Ref Rng & Units 07/03/2017 06/19/2017 06/05/2017  Glucose 70 - 140 mg/dl 105 112 164(H)  BUN 7.0 - 26.0 mg/dL 5.3(L) 5.9(L) 5.2(L)  Creatinine 0.6 - 1.1 mg/dL 0.6 0.7 0.7  Sodium 136 - 145 mEq/L 140 140 138  Potassium 3.5 - 5.1 mEq/L 3.3(L) 3.4(L) 3.7  Chloride 96 - 112 mEq/L - - -  CO2 22 - 29 mEq/L _2 Calcium 8.4 - 10.4 mg/dL 9.3 9.0 9.8  Total Protein 6.4 - 8.3 g/dL 7.0 6.9 7.5  Total Bilirubin 0.20 - 1.20 mg/dL 0.23 0.22 0.25  Alkaline Phos 40 - 150 U/L 54 79 91  AST 5 - 34 U/L _3 ALT 0 - 55 U/L _4 PATHOLOGY  Diagnosis 04/29/17 1. Breast, left, needle core biopsy, 5:30 o'clock, mass - INVASIVE DUCTAL CARCINOMA. - DUCTAL CARCINOMA IN SITU. - SEE COMMENT. 2. Lymph node, needle/core biopsy, left axilla - THERE IN NO EVIDENCE  OF CARCINOMA IN 1 OF 1 LYMPH NODE (0/1). Microscopic Comment 1. The carcinoma appears grade 2-3. A breast prognostic profile will be performed and the results reported separately. The results were called to [facility] on 04/30/17. (JBK:gt, 04/30/17) 1. PROGNOSTIC  INDICATORS Results: IMMUNOHISTOCHEMICAL AND MORPHOMETRIC ANALYSIS PERFORMED MANUALLY Estrogen Receptor: 0%, NEGATIVE Progesterone Receptor: 0%, NEGATIVE Proliferation Marker Ki67: 90% COMMENT: The negative hormone receptor study(ies) in this case has an internal positive control. 1. FLUORESCENCE IN-SITU HYBRIDIZATION Results: HER2 - NEGATIVE RATIO OF HER2/CEP17 SIGNALS 1.11 AVERAGE HER2 COPY NUMBER PER CELL 2.1    PROCEDURES   ECHO 05/14/17 Study Conclusions - Left ventricle: The cavity size was normal. Systolic function was normal. The estimated ejection fraction was in the range of 55% to 60%. Wall motion was normal; there were no regional wall motion abnormalities. Left ventricular diastolic function parameters were normal.  RADIOGRAPHIC STUDIES: I have personally reviewed the radiological images as listed and agreed with the findings in the report.  Bone scan whole body 05/15/17 IMPRESSION: No findings specific for metastatic disease to bone.  CT CAP W contrast 05/15/17  IMPRESSION: 1. 18 mm soft tissue lesion inferior left breast compatible with known primary malignancy. 2. Mildly enlarged left axillary lymph nodes in this patient with biopsy-proven metastatic disease to the left axilla. 3. No evidence for lymphadenopathy elsewhere in the chest. No lymphadenopathy in the abdomen or pelvis. No evidence for metastatic disease in the abdomen or pelvis. 4. Tiny right perifissural lung nodule most likely subpleural lymph node. Attention on follow-up recommended. 5. 7 mm hypoattenuating lesion in the spleen, likely a cyst or pseudocyst. Attention on follow-up recommended. 6. Uterine fibroids. 7.  Aortic Atherosclerois (ICD10-170.0)  No results found. ASSESSMENT & PLAN:  Briana Price is a 51 y.o. african-american female with a history of HTN and arthritis in her back, presented with a palpable left breast mass.  1. Malignant neoplasm of lower-outer quadrant of left  breast of female, invasive ductal carcinoma, c2T0M0, Stage IIB, ER/PR: negative, HER2: negative, Grade 3.  -We discussed her mammogram, ultrasound findings, and initial biopsy results with patient and her family members in details.  -She presented with a palpable left breast mass, measures 2.5 cm on ultrasound, with a prominent lymph node. 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 -She is a candidate for lumpectomy and sentinel lymph node biopsy. She was seen by surgeon Dr. [name] today. Due to her young age and triple negative disease, we recommended her to see genetics, to ruled out inheritable genetic syndrome. -I reviewed her breast MRI findings, which showed additional non-mass enhancement in the left breast, which is anterior to the biopsy proven breast cancer, in the lower inner quadrant. I recommend her to have additional biopsy, she agrees, we'll schedule for this week. -I reviewed her CT scan and bone scan findings in great detail, no evidence of distant metastasis. -both MRI and a CT scan showed enlarged left axillary lymph node, but biopsy was negative for malignancy, although I still have some concern that she may have metastatic adenopathy. -She agreed to proceed with neoadjuvant chemotherapy. She has concerns about the secondary malignancy from Adriamycin. However given the extensive of her disease, I strongly encouraged her to take Adriamycin and Cytoxan, followed by Taxol.  --She started [facility] with Onpro on 05/22/17. She has tolerated well overall. After second cycle she developed Leukocytosis secondary to Onpro. Onpro was skipped with her Cycle 3  treatment.  -I discussed her mild heart murmur is likely related to her anemia. Her 05/2017 ECHO shows no structural issues with her valves.  -Labs reviewed, she is now neutropenic, ANC is 0.9 due to her skipped Onpro with last treatment. Hg is 9.8, no need for blood transfusion today.   -I suggest she holds cycle 4 AC until next week to give time for her counts to recover. She agrees.  -No chemo today  -She has had good clinical response to chemotherapy. Her left breast mass is not palpable anymore. -I answered all her questions and all her concerns about her treatment plan.  -f/u on 11/16, when she will start weekly Taxol    2. Genetics -Based on her significant familial breast cancer history, her young age and triple negative disease,  I suggest she gets genetic testing for possible gene mutations.  -Apt with Roma Kayser in genetics on 05/28/17 -Positive for Variant of Uncertain Significance: ATM, but negative for other 46 genes on [facility]'s Common Cancers panel (APC, ATM, AXIN2, BARD1, BMPR1A, BRCA1, BRCA2, BRIP1, CDH1, CDKN2A, CHEK2, CTNNA1, DICER1, EPCAM, GREM1, HOXB13, KIT, MEN1, MLH1, MSH2, MSH3, MSH6, MUTYH, NBN, NF1, NTHL1, PALB2, PDGFRA, PMS2, POLD1, POLE, PTEN, RAD50, RAD51C, RAD51D, SDHA, SDHB, SDHC, SDHD, SMAD4, SMARCA4, STK11, TP53, TSC1, TSC2, VHL).   3. HTN -she is on HCTZ and lisinopril , we'll continue, she will follow-up with her primary care physician  -We discussed the impact of chemotherapy, blood pressure, and a possibility that we may hold her blood pressure medication if she gets dehydrated.   4. Rosacea -Dermatologist prescribed topical steroid -OK to use during chemotherapy; she will also receive IV steroid which may help control  5. Skin wound at [facility] site and folliculitis in front upper chest, resolved -drained and cultured on 06/05/17  -patient afebrile without signs of systemic infection, but is immune compromised while undergoing chemotherapy -Given bactrim DS BID x10 days for MRSA coverage prescribed 06/05/17 -Resolved now   PLAN:  -Cancel chemo today due to neutropenia  -Lab, flush and chemo AC 10/30  -Lab, flush and weekly taxol X8 starting 11/16  -f/u on 11/16    No orders of the defined types were placed in this  encounter.  All questions were answered. The patient knows to call the clinic with any problems, questions or concerns. I spent 25 minutes counseling the patient face to face. The total time spent in the appointment was 30 minutes and more than 50% was on counseling.  This document serves as a record of services personally performed by [name], MD. It was created on her behalf by Joslyn Devon, a trained medical scribe. The creation of this record is based on the scribe's personal observations and the provider's statements to them. This document has been checked and approved by the attending provider.      [name], MD 07/03/2017

## 2017-07-03 NOTE — Telephone Encounter (Signed)
Gave patient avs report and appointments for October thru January.  °

## 2017-07-05 ENCOUNTER — Encounter: Payer: Self-pay | Admitting: Hematology

## 2017-07-07 ENCOUNTER — Encounter: Payer: Self-pay | Admitting: *Deleted

## 2017-07-07 ENCOUNTER — Ambulatory Visit (HOSPITAL_BASED_OUTPATIENT_CLINIC_OR_DEPARTMENT_OTHER): Payer: Federal, State, Local not specified - PPO

## 2017-07-07 ENCOUNTER — Ambulatory Visit: Payer: Federal, State, Local not specified - PPO

## 2017-07-07 ENCOUNTER — Other Ambulatory Visit (HOSPITAL_BASED_OUTPATIENT_CLINIC_OR_DEPARTMENT_OTHER): Payer: Federal, State, Local not specified - PPO

## 2017-07-07 VITALS — BP 138/92 | HR 85 | Temp 98.2°F | Resp 19

## 2017-07-07 DIAGNOSIS — Z171 Estrogen receptor negative status [ER-]: Principal | ICD-10-CM

## 2017-07-07 DIAGNOSIS — Z5189 Encounter for other specified aftercare: Secondary | ICD-10-CM | POA: Diagnosis not present

## 2017-07-07 DIAGNOSIS — C773 Secondary and unspecified malignant neoplasm of axilla and upper limb lymph nodes: Secondary | ICD-10-CM

## 2017-07-07 DIAGNOSIS — C50512 Malignant neoplasm of lower-outer quadrant of left female breast: Secondary | ICD-10-CM | POA: Diagnosis not present

## 2017-07-07 DIAGNOSIS — Z95828 Presence of other vascular implants and grafts: Secondary | ICD-10-CM

## 2017-07-07 DIAGNOSIS — Z5111 Encounter for antineoplastic chemotherapy: Secondary | ICD-10-CM

## 2017-07-07 LAB — COMPREHENSIVE METABOLIC PANEL
ALT: 40 U/L (ref 0–55)
AST: 37 U/L — ABNORMAL HIGH (ref 5–34)
Albumin: 3.8 g/dL (ref 3.5–5.0)
Alkaline Phosphatase: 58 U/L (ref 40–150)
Anion Gap: 8 mEq/L (ref 3–11)
BUN: 5.2 mg/dL — ABNORMAL LOW (ref 7.0–26.0)
CHLORIDE: 104 meq/L (ref 98–109)
CO2: 27 mEq/L (ref 22–29)
Calcium: 9.3 mg/dL (ref 8.4–10.4)
Creatinine: 0.6 mg/dL (ref 0.6–1.1)
GLUCOSE: 101 mg/dL (ref 70–140)
POTASSIUM: 3.7 meq/L (ref 3.5–5.1)
SODIUM: 139 meq/L (ref 136–145)
Total Bilirubin: 0.29 mg/dL (ref 0.20–1.20)
Total Protein: 7.4 g/dL (ref 6.4–8.3)

## 2017-07-07 LAB — CBC WITH DIFFERENTIAL/PLATELET
BASO%: 2.8 % — AB (ref 0.0–2.0)
Basophils Absolute: 0.2 10*3/uL — ABNORMAL HIGH (ref 0.0–0.1)
EOS%: 1.1 % (ref 0.0–7.0)
Eosinophils Absolute: 0.1 10*3/uL (ref 0.0–0.5)
HCT: 31.7 % — ABNORMAL LOW (ref 34.8–46.6)
HGB: 10.7 g/dL — ABNORMAL LOW (ref 11.6–15.9)
LYMPH%: 19.3 % (ref 14.0–49.7)
MCH: 27.6 pg (ref 25.1–34.0)
MCHC: 33.8 g/dL (ref 31.5–36.0)
MCV: 81.7 fL (ref 79.5–101.0)
MONO#: 1.5 10*3/uL — AB (ref 0.1–0.9)
MONO%: 20.9 % — AB (ref 0.0–14.0)
NEUT%: 55.9 % (ref 38.4–76.8)
NEUTROS ABS: 4 10*3/uL (ref 1.5–6.5)
Platelets: 626 10*3/uL — ABNORMAL HIGH (ref 145–400)
RBC: 3.88 10*6/uL (ref 3.70–5.45)
RDW: 16 % — ABNORMAL HIGH (ref 11.2–14.5)
WBC: 7.1 10*3/uL (ref 3.9–10.3)
lymph#: 1.4 10*3/uL (ref 0.9–3.3)

## 2017-07-07 LAB — TECHNOLOGIST REVIEW

## 2017-07-07 MED ORDER — SODIUM CHLORIDE 0.9% FLUSH
10.0000 mL | INTRAVENOUS | Status: DC | PRN
  Administered 2017-07-07: 10 mL
  Filled 2017-07-07: qty 10

## 2017-07-07 MED ORDER — PEGFILGRASTIM 6 MG/0.6ML ~~LOC~~ PSKT
6.0000 mg | PREFILLED_SYRINGE | Freq: Once | SUBCUTANEOUS | Status: AC
  Administered 2017-07-07: 6 mg via SUBCUTANEOUS
  Filled 2017-07-07: qty 0.6

## 2017-07-07 MED ORDER — HEPARIN SOD (PORK) LOCK FLUSH 100 UNIT/ML IV SOLN
500.0000 [IU] | Freq: Once | INTRAVENOUS | Status: AC | PRN
  Administered 2017-07-07: 500 [IU]
  Filled 2017-07-07: qty 5

## 2017-07-07 MED ORDER — PALONOSETRON HCL INJECTION 0.25 MG/5ML
INTRAVENOUS | Status: AC
  Filled 2017-07-07: qty 5

## 2017-07-07 MED ORDER — SODIUM CHLORIDE 0.9% FLUSH
10.0000 mL | INTRAVENOUS | Status: DC | PRN
  Administered 2017-07-07: 10 mL via INTRAVENOUS
  Filled 2017-07-07: qty 10

## 2017-07-07 MED ORDER — SODIUM CHLORIDE 0.9 % IV SOLN
Freq: Once | INTRAVENOUS | Status: AC
  Administered 2017-07-07: 13:00:00 via INTRAVENOUS
  Filled 2017-07-07: qty 5

## 2017-07-07 MED ORDER — SODIUM CHLORIDE 0.9 % IV SOLN
600.0000 mg/m2 | Freq: Once | INTRAVENOUS | Status: AC
  Administered 2017-07-07: 1300 mg via INTRAVENOUS
  Filled 2017-07-07: qty 65

## 2017-07-07 MED ORDER — SODIUM CHLORIDE 0.9 % IV SOLN
Freq: Once | INTRAVENOUS | Status: AC
  Administered 2017-07-07: 13:00:00 via INTRAVENOUS

## 2017-07-07 MED ORDER — DOXORUBICIN HCL CHEMO IV INJECTION 2 MG/ML
60.0000 mg/m2 | Freq: Once | INTRAVENOUS | Status: AC
Start: 2017-07-07 — End: 2017-07-07
  Administered 2017-07-07: 130 mg via INTRAVENOUS
  Filled 2017-07-07: qty 65

## 2017-07-07 MED ORDER — PALONOSETRON HCL INJECTION 0.25 MG/5ML
0.2500 mg | Freq: Once | INTRAVENOUS | Status: AC
  Administered 2017-07-07: 0.25 mg via INTRAVENOUS

## 2017-07-07 NOTE — Patient Instructions (Signed)
[facility] Discharge Instructions for Patients Receiving Chemotherapy  Today you received the following chemotherapy agents Adriamycin and Cytoxan  To help prevent nausea and vomiting after your treatment, we encourage you to take your nausea medication as directed.  If you develop nausea and vomiting that is not controlled by your nausea medication, call the clinic.   BELOW ARE SYMPTOMS THAT SHOULD BE REPORTED IMMEDIATELY:  *FEVER GREATER THAN 100.5 F  *CHILLS WITH OR WITHOUT FEVER  NAUSEA AND VOMITING THAT IS NOT CONTROLLED WITH YOUR NAUSEA MEDICATION  *UNUSUAL SHORTNESS OF BREATH  *UNUSUAL BRUISING OR BLEEDING  TENDERNESS IN MOUTH AND THROAT WITH OR WITHOUT PRESENCE OF ULCERS  *URINARY PROBLEMS  *BOWEL PROBLEMS  UNUSUAL RASH Items with * indicate a potential emergency and should be followed up as soon as possible.  Feel free to call the clinic should you have any questions or concerns. The clinic phone number is [phone].  Please show the CHEMO ALERT CARD at check-in to the Emergency Department and triage nurse.   

## 2017-07-13 ENCOUNTER — Telehealth: Payer: Self-pay | Admitting: *Deleted

## 2017-07-13 NOTE — Telephone Encounter (Signed)
Received message from [facility] re: pt called yesterday  11/4 reporting of a sore throat.   Spoke with pt today, and was informed that pt was feeling better.  Denied fever, denied coughing, denied pain.  Stated had heat problem at home - was cold, hot, cold from home temp.  Stated she took Theraflu yesterday, and another dose this am.  Stated she was feeling better.   Pt understood to call office back if symptoms worsened, and/or developed fever. Pt aware of next office and chemo appts. Pt's    Phone    [phone].

## 2017-07-23 ENCOUNTER — Other Ambulatory Visit: Payer: Self-pay | Admitting: Hematology

## 2017-07-23 DIAGNOSIS — Z1379 Encounter for other screening for genetic and chromosomal anomalies: Secondary | ICD-10-CM

## 2017-07-24 ENCOUNTER — Other Ambulatory Visit (HOSPITAL_BASED_OUTPATIENT_CLINIC_OR_DEPARTMENT_OTHER): Payer: Federal, State, Local not specified - PPO

## 2017-07-24 ENCOUNTER — Ambulatory Visit (HOSPITAL_BASED_OUTPATIENT_CLINIC_OR_DEPARTMENT_OTHER): Payer: Federal, State, Local not specified - PPO

## 2017-07-24 ENCOUNTER — Ambulatory Visit: Payer: Federal, State, Local not specified - PPO

## 2017-07-24 ENCOUNTER — Other Ambulatory Visit: Payer: Self-pay | Admitting: Hematology

## 2017-07-24 ENCOUNTER — Encounter: Payer: Self-pay | Admitting: Hematology

## 2017-07-24 VITALS — BP 156/97 | HR 83 | Temp 98.7°F | Resp 18

## 2017-07-24 DIAGNOSIS — Z5111 Encounter for antineoplastic chemotherapy: Secondary | ICD-10-CM | POA: Diagnosis not present

## 2017-07-24 DIAGNOSIS — C50512 Malignant neoplasm of lower-outer quadrant of left female breast: Secondary | ICD-10-CM

## 2017-07-24 DIAGNOSIS — C773 Secondary and unspecified malignant neoplasm of axilla and upper limb lymph nodes: Secondary | ICD-10-CM

## 2017-07-24 DIAGNOSIS — Z95828 Presence of other vascular implants and grafts: Secondary | ICD-10-CM

## 2017-07-24 DIAGNOSIS — Z171 Estrogen receptor negative status [ER-]: Principal | ICD-10-CM

## 2017-07-24 LAB — COMPREHENSIVE METABOLIC PANEL
ALBUMIN: 3.7 g/dL (ref 3.5–5.0)
ALK PHOS: 70 U/L (ref 40–150)
ALT: 27 U/L (ref 0–55)
AST: 31 U/L (ref 5–34)
Anion Gap: 9 mEq/L (ref 3–11)
BILIRUBIN TOTAL: 0.39 mg/dL (ref 0.20–1.20)
BUN: 6.3 mg/dL — ABNORMAL LOW (ref 7.0–26.0)
CO2: 28 mEq/L (ref 22–29)
CREATININE: 0.7 mg/dL (ref 0.6–1.1)
Calcium: 9.7 mg/dL (ref 8.4–10.4)
Chloride: 102 mEq/L (ref 98–109)
GLUCOSE: 110 mg/dL (ref 70–140)
Potassium: 3.3 mEq/L — ABNORMAL LOW (ref 3.5–5.1)
SODIUM: 139 meq/L (ref 136–145)
TOTAL PROTEIN: 7.2 g/dL (ref 6.4–8.3)

## 2017-07-24 LAB — CBC WITH DIFFERENTIAL/PLATELET
BASO%: 1.2 % (ref 0.0–2.0)
Basophils Absolute: 0.1 10*3/uL (ref 0.0–0.1)
EOS ABS: 0.1 10*3/uL (ref 0.0–0.5)
EOS%: 0.6 % (ref 0.0–7.0)
HCT: 31.2 % — ABNORMAL LOW (ref 34.8–46.6)
HEMOGLOBIN: 10.4 g/dL — AB (ref 11.6–15.9)
LYMPH%: 11.5 % — ABNORMAL LOW (ref 14.0–49.7)
MCH: 27.6 pg (ref 25.1–34.0)
MCHC: 33.4 g/dL (ref 31.5–36.0)
MCV: 82.8 fL (ref 79.5–101.0)
MONO#: 0.9 10*3/uL (ref 0.1–0.9)
MONO%: 9.4 % (ref 0.0–14.0)
NEUT%: 77.3 % — ABNORMAL HIGH (ref 38.4–76.8)
NEUTROS ABS: 7.3 10*3/uL — AB (ref 1.5–6.5)
PLATELETS: 176 10*3/uL (ref 145–400)
RBC: 3.77 10*6/uL (ref 3.70–5.45)
RDW: 17.7 % — AB (ref 11.2–14.5)
WBC: 9.4 10*3/uL (ref 3.9–10.3)
lymph#: 1.1 10*3/uL (ref 0.9–3.3)

## 2017-07-24 MED ORDER — DEXAMETHASONE SODIUM PHOSPHATE 10 MG/ML IJ SOLN
INTRAMUSCULAR | Status: AC
  Filled 2017-07-24: qty 1

## 2017-07-24 MED ORDER — SODIUM CHLORIDE 0.9% FLUSH
10.0000 mL | INTRAVENOUS | Status: DC | PRN
  Administered 2017-07-24: 10 mL
  Filled 2017-07-24: qty 10

## 2017-07-24 MED ORDER — FAMOTIDINE IN NACL 20-0.9 MG/50ML-% IV SOLN
20.0000 mg | Freq: Once | INTRAVENOUS | Status: AC
  Administered 2017-07-24: 20 mg via INTRAVENOUS

## 2017-07-24 MED ORDER — SODIUM CHLORIDE 0.9% FLUSH
10.0000 mL | INTRAVENOUS | Status: DC | PRN
  Administered 2017-07-24: 10 mL via INTRAVENOUS
  Filled 2017-07-24: qty 10

## 2017-07-24 MED ORDER — DIPHENHYDRAMINE HCL 50 MG/ML IJ SOLN
INTRAMUSCULAR | Status: AC
  Filled 2017-07-24: qty 1

## 2017-07-24 MED ORDER — SODIUM CHLORIDE 0.9 % IV SOLN
10.0000 mg | Freq: Once | INTRAVENOUS | Status: DC
  Filled 2017-07-24: qty 1

## 2017-07-24 MED ORDER — POTASSIUM CHLORIDE CRYS ER 20 MEQ PO TBCR: 20.0000 meq | EXTENDED_RELEASE_TABLET | Freq: Every day | ORAL | 0 refills | Status: DC

## 2017-07-24 MED ORDER — DIPHENHYDRAMINE HCL 50 MG/ML IJ SOLN
25.0000 mg | Freq: Once | INTRAMUSCULAR | Status: AC
  Administered 2017-07-24: 25 mg via INTRAVENOUS

## 2017-07-24 MED ORDER — DEXAMETHASONE SODIUM PHOSPHATE 10 MG/ML IJ SOLN
10.0000 mg | Freq: Once | INTRAMUSCULAR | Status: AC
  Administered 2017-07-24: 10 mg via INTRAVENOUS

## 2017-07-24 MED ORDER — SODIUM CHLORIDE 0.9 % IV SOLN
Freq: Once | INTRAVENOUS | Status: AC
  Administered 2017-07-24: 14:00:00 via INTRAVENOUS

## 2017-07-24 MED ORDER — HEPARIN SOD (PORK) LOCK FLUSH 100 UNIT/ML IV SOLN
500.0000 [IU] | Freq: Once | INTRAVENOUS | Status: AC | PRN
  Administered 2017-07-24: 500 [IU]
  Filled 2017-07-24: qty 5

## 2017-07-24 MED ORDER — SODIUM CHLORIDE 0.9 % IV SOLN
80.0000 mg/m2 | Freq: Once | INTRAVENOUS | Status: AC
  Administered 2017-07-24: 168 mg via INTRAVENOUS
  Filled 2017-07-24: qty 28

## 2017-07-24 MED ORDER — FAMOTIDINE IN NACL 20-0.9 MG/50ML-% IV SOLN
INTRAVENOUS | Status: AC
  Filled 2017-07-24: qty 50

## 2017-07-24 NOTE — Addendum Note (Signed)
Addended by: [name] on: 07/24/2017 05:37 PM   Modules accepted: Orders

## 2017-07-24 NOTE — Progress Notes (Signed)
Met with patient to introduce myself as Arboriculturist and to advise of copay assistance available for Neulasta through [place]. Asked patient if she is interested in applying. She states yes.   Also, asked patient if she is interested in applying for additional assistance through [employer]. She states yes. Patient was able to pull up her household income information and provide.  Enrolled patient in [place] for Neulasta. Patient approved for $10,000 covering the first one at 100% and only leaving a $5 copay for each additional injection. Gave patient my card for any additional financial questions or concerns.  Attempted to enroll patient in copay assistance through [employer] and the funds are fully allocated at this time.

## 2017-07-24 NOTE — Progress Notes (Signed)
Some elevated BP noted in [facility] that Dr. Burr Medico okay to proceed with treatment. VSS upon d/c. Okay to run taxol over one hour during next cycle.

## 2017-07-24 NOTE — Patient Instructions (Signed)
[facility] Discharge Instructions for Patients Receiving Chemotherapy  Today you received the following chemotherapy agents paclitaxel (Taxol)  To help prevent nausea and vomiting after your treatment, we encourage you to take your nausea medication as directed by your doctor.   If you develop nausea and vomiting that is not controlled by your nausea medication, call the clinic.   BELOW ARE SYMPTOMS THAT SHOULD BE REPORTED IMMEDIATELY:  *FEVER GREATER THAN 100.5 F  *CHILLS WITH OR WITHOUT FEVER  NAUSEA AND VOMITING THAT IS NOT CONTROLLED WITH YOUR NAUSEA MEDICATION  *UNUSUAL SHORTNESS OF BREATH  *UNUSUAL BRUISING OR BLEEDING  TENDERNESS IN MOUTH AND THROAT WITH OR WITHOUT PRESENCE OF ULCERS  *URINARY PROBLEMS  *BOWEL PROBLEMS  UNUSUAL RASH Items with * indicate a potential emergency and should be followed up as soon as possible.  Feel free to call the clinic should you have any questions or concerns. The clinic phone number is [phone].  Please show the [facility] at check-in to the Emergency Department and triage nurse.  Paclitaxel injection What is this medicine? PACLITAXEL (PAK li TAX el) is a chemotherapy drug. It targets fast dividing cells, like cancer cells, and causes these cells to die. This medicine is used to treat ovarian cancer, breast cancer, and other cancers. This medicine may be used for other purposes; ask your health care provider or pharmacist if you have questions. COMMON BRAND NAME(S): Onxol, Taxol What should I tell my health care provider before I take this medicine? They need to know if you have any of these conditions: -blood disorders -irregular heartbeat -infection (especially a virus infection such as chickenpox, cold sores, or herpes) -liver disease -previous or ongoing radiation therapy -an unusual or allergic reaction to paclitaxel, alcohol, polyoxyethylated castor oil, other chemotherapy agents, other medicines,  foods, dyes, or preservatives -pregnant or trying to get pregnant -breast-feeding How should I use this medicine? This drug is given as an infusion into a vein. It is administered in a hospital or clinic by a specially trained health care professional. Talk to your pediatrician regarding the use of this medicine in children. Special care may be needed. Overdosage: If you think you have taken too much of this medicine contact a poison control center or emergency room at once. NOTE: This medicine is only for you. Do not share this medicine with others. What if I miss a dose? It is important not to miss your dose. Call your doctor or health care professional if you are unable to keep an appointment. What may interact with this medicine? Do not take this medicine with any of the following medications: -disulfiram -metronidazole This medicine may also interact with the following medications: -cyclosporine -diazepam -ketoconazole -medicines to increase blood counts like filgrastim, pegfilgrastim, sargramostim -other chemotherapy drugs like cisplatin, doxorubicin, epirubicin, etoposide, teniposide, vincristine -quinidine -testosterone -vaccines -verapamil Talk to your doctor or health care professional before taking any of these medicines: -acetaminophen -aspirin -ibuprofen -ketoprofen -naproxen This list may not describe all possible interactions. Give your health care provider a list of all the medicines, herbs, non-prescription drugs, or dietary supplements you use. Also tell them if you smoke, drink alcohol, or use illegal drugs. Some items may interact with your medicine. What should I watch for while using this medicine? Your condition will be monitored carefully while you are receiving this medicine. You will need important blood work done while you are taking this medicine. This medicine can cause serious allergic reactions. To reduce your risk you  will need to take other  medicine(s) before treatment with this medicine. If you experience allergic reactions like skin rash, itching or hives, swelling of the face, lips, or tongue, tell your doctor or health care professional right away. In some cases, you may be given additional medicines to help with side effects. Follow all directions for their use. This drug may make you feel generally unwell. This is not uncommon, as chemotherapy can affect healthy cells as well as cancer cells. Report any side effects. Continue your course of treatment even though you feel ill unless your doctor tells you to stop. Call your doctor or health care professional for advice if you get a fever, chills or sore throat, or other symptoms of a cold or flu. Do not treat yourself. This drug decreases your body's ability to fight infections. Try to avoid being around people who are sick. This medicine may increase your risk to bruise or bleed. Call your doctor or health care professional if you notice any unusual bleeding. Be careful brushing and flossing your teeth or using a toothpick because you may get an infection or bleed more easily. If you have any dental work done, tell your dentist you are receiving this medicine. Avoid taking products that contain aspirin, acetaminophen, ibuprofen, naproxen, or ketoprofen unless instructed by your doctor. These medicines may hide a fever. Do not become pregnant while taking this medicine. Women should inform their doctor if they wish to become pregnant or think they might be pregnant. There is a potential for serious side effects to an unborn child. Talk to your health care professional or pharmacist for more information. Do not breast-feed an infant while taking this medicine. Men are advised not to father a child while receiving this medicine. This product may contain alcohol. Ask your pharmacist or healthcare provider if this medicine contains alcohol. Be sure to tell all healthcare providers you are  taking this medicine. Certain medicines, like metronidazole and disulfiram, can cause an unpleasant reaction when taken with alcohol. The reaction includes flushing, headache, nausea, vomiting, sweating, and increased thirst. The reaction can last from 30 minutes to several hours. What side effects may I notice from receiving this medicine? Side effects that you should report to your doctor or health care professional as soon as possible: -allergic reactions like skin rash, itching or hives, swelling of the face, lips, or tongue -low blood counts - This drug may decrease the number of white blood cells, red blood cells and platelets. You may be at increased risk for infections and bleeding. -signs of infection - fever or chills, cough, sore throat, pain or difficulty passing urine -signs of decreased platelets or bleeding - bruising, pinpoint red spots on the skin, black, tarry stools, nosebleeds -signs of decreased red blood cells - unusually weak or tired, fainting spells, lightheadedness -breathing problems -chest pain -high or low blood pressure -mouth sores -nausea and vomiting -pain, swelling, redness or irritation at the injection site -pain, tingling, numbness in the hands or feet -slow or irregular heartbeat -swelling of the ankle, feet, hands Side effects that usually do not require medical attention (report to your doctor or health care professional if they continue or are bothersome): -bone pain -complete hair loss including hair on your head, underarms, pubic hair, eyebrows, and eyelashes -changes in the color of fingernails -diarrhea -loosening of the fingernails -loss of appetite -muscle or joint pain -red flush to skin -sweating This list may not describe all possible side effects. Call your doctor for   medical advice about side effects. You may report side effects to FDA at 1-800-FDA-1088. Where should I keep my medicine? This drug is given in a hospital or clinic and  will not be stored at home. NOTE: This sheet is a summary. It may not cover all possible information. If you have questions about this medicine, talk to your doctor, pharmacist, or health care provider.  2018 [employer]vier/Gold Standard (2015-06-26 19:58:00)

## 2017-07-26 ENCOUNTER — Telehealth: Payer: Self-pay

## 2017-07-26 NOTE — Telephone Encounter (Signed)
Called and left a message with appts per 11/16 inbasket

## 2017-07-31 ENCOUNTER — Other Ambulatory Visit (HOSPITAL_BASED_OUTPATIENT_CLINIC_OR_DEPARTMENT_OTHER): Payer: Federal, State, Local not specified - PPO

## 2017-07-31 ENCOUNTER — Ambulatory Visit: Payer: Federal, State, Local not specified - PPO

## 2017-07-31 ENCOUNTER — Encounter: Payer: Self-pay | Admitting: Nurse Practitioner

## 2017-07-31 ENCOUNTER — Ambulatory Visit (HOSPITAL_BASED_OUTPATIENT_CLINIC_OR_DEPARTMENT_OTHER): Payer: Federal, State, Local not specified - PPO

## 2017-07-31 ENCOUNTER — Other Ambulatory Visit: Payer: Federal, State, Local not specified - PPO

## 2017-07-31 ENCOUNTER — Ambulatory Visit (HOSPITAL_BASED_OUTPATIENT_CLINIC_OR_DEPARTMENT_OTHER): Payer: Federal, State, Local not specified - PPO | Admitting: Nurse Practitioner

## 2017-07-31 VITALS — BP 150/91 | HR 79 | Temp 98.0°F | Resp 18 | Ht 66.0 in | Wt 208.5 lb

## 2017-07-31 DIAGNOSIS — Z5111 Encounter for antineoplastic chemotherapy: Secondary | ICD-10-CM | POA: Diagnosis not present

## 2017-07-31 DIAGNOSIS — I10 Essential (primary) hypertension: Secondary | ICD-10-CM | POA: Diagnosis not present

## 2017-07-31 DIAGNOSIS — C50512 Malignant neoplasm of lower-outer quadrant of left female breast: Secondary | ICD-10-CM

## 2017-07-31 DIAGNOSIS — Z171 Estrogen receptor negative status [ER-]: Principal | ICD-10-CM

## 2017-07-31 DIAGNOSIS — C773 Secondary and unspecified malignant neoplasm of axilla and upper limb lymph nodes: Secondary | ICD-10-CM | POA: Diagnosis not present

## 2017-07-31 LAB — CBC WITH DIFFERENTIAL/PLATELET
BASO%: 2.5 % — AB (ref 0.0–2.0)
Basophils Absolute: 0.1 10*3/uL (ref 0.0–0.1)
EOS%: 1.1 % (ref 0.0–7.0)
Eosinophils Absolute: 0.1 10*3/uL (ref 0.0–0.5)
HCT: 31.3 % — ABNORMAL LOW (ref 34.8–46.6)
HEMOGLOBIN: 10.2 g/dL — AB (ref 11.6–15.9)
LYMPH#: 1.2 10*3/uL (ref 0.9–3.3)
LYMPH%: 20.6 % (ref 14.0–49.7)
MCH: 27.6 pg (ref 25.1–34.0)
MCHC: 32.6 g/dL (ref 31.5–36.0)
MCV: 84.6 fL (ref 79.5–101.0)
MONO#: 0.7 10*3/uL (ref 0.1–0.9)
MONO%: 11.8 % (ref 0.0–14.0)
NEUT%: 64 % (ref 38.4–76.8)
NEUTROS ABS: 3.6 10*3/uL (ref 1.5–6.5)
Platelets: 358 10*3/uL (ref 145–400)
RBC: 3.7 10*6/uL (ref 3.70–5.45)
RDW: 16.7 % — ABNORMAL HIGH (ref 11.2–14.5)
WBC: 5.6 10*3/uL (ref 3.9–10.3)
nRBC: 0 % (ref 0–0)

## 2017-07-31 LAB — COMPREHENSIVE METABOLIC PANEL
ALBUMIN: 3.6 g/dL (ref 3.5–5.0)
ALK PHOS: 53 U/L (ref 40–150)
ALT: 41 U/L (ref 0–55)
AST: 34 U/L (ref 5–34)
Anion Gap: 10 mEq/L (ref 3–11)
BILIRUBIN TOTAL: 0.32 mg/dL (ref 0.20–1.20)
BUN: 8.1 mg/dL (ref 7.0–26.0)
CALCIUM: 9.4 mg/dL (ref 8.4–10.4)
CO2: 25 mEq/L (ref 22–29)
Chloride: 104 mEq/L (ref 98–109)
Creatinine: 0.7 mg/dL (ref 0.6–1.1)
GLUCOSE: 130 mg/dL (ref 70–140)
POTASSIUM: 3.6 meq/L (ref 3.5–5.1)
SODIUM: 140 meq/L (ref 136–145)
TOTAL PROTEIN: 7.1 g/dL (ref 6.4–8.3)

## 2017-07-31 MED ORDER — FAMOTIDINE IN NACL 20-0.9 MG/50ML-% IV SOLN
INTRAVENOUS | Status: AC
Start: 2017-07-31 — End: 2017-07-31
  Filled 2017-07-31: qty 50

## 2017-07-31 MED ORDER — SODIUM CHLORIDE 0.9 % IV SOLN
80.0000 mg/m2 | Freq: Once | INTRAVENOUS | Status: AC
  Administered 2017-07-31: 168 mg via INTRAVENOUS
  Filled 2017-07-31: qty 28

## 2017-07-31 MED ORDER — FAMOTIDINE IN NACL 20-0.9 MG/50ML-% IV SOLN
20.0000 mg | Freq: Once | INTRAVENOUS | Status: AC
  Administered 2017-07-31: 20 mg via INTRAVENOUS

## 2017-07-31 MED ORDER — SODIUM CHLORIDE 0.9 % IV SOLN: 10.0000 mg | Freq: Once | INTRAVENOUS | Status: DC

## 2017-07-31 MED ORDER — DIPHENHYDRAMINE HCL 50 MG/ML IJ SOLN
INTRAMUSCULAR | Status: AC
Start: 2017-07-31 — End: 2017-07-31
  Filled 2017-07-31: qty 1

## 2017-07-31 MED ORDER — DEXAMETHASONE SODIUM PHOSPHATE 10 MG/ML IJ SOLN
INTRAMUSCULAR | Status: AC
  Filled 2017-07-31: qty 1

## 2017-07-31 MED ORDER — DIPHENHYDRAMINE HCL 50 MG/ML IJ SOLN
25.0000 mg | Freq: Once | INTRAMUSCULAR | Status: AC
  Administered 2017-07-31: 25 mg via INTRAVENOUS

## 2017-07-31 MED ORDER — HEPARIN SOD (PORK) LOCK FLUSH 100 UNIT/ML IV SOLN
500.0000 [IU] | Freq: Once | INTRAVENOUS | Status: AC | PRN
  Administered 2017-07-31: 500 [IU]
  Filled 2017-07-31: qty 5

## 2017-07-31 MED ORDER — DEXAMETHASONE SODIUM PHOSPHATE 10 MG/ML IJ SOLN
10.0000 mg | Freq: Once | INTRAMUSCULAR | Status: AC
  Administered 2017-07-31: 10 mg via INTRAVENOUS

## 2017-07-31 MED ORDER — SODIUM CHLORIDE 0.9 % IV SOLN
Freq: Once | INTRAVENOUS | Status: AC
  Administered 2017-07-31: 15:00:00 via INTRAVENOUS

## 2017-07-31 MED ORDER — SODIUM CHLORIDE 0.9% FLUSH
10.0000 mL | INTRAVENOUS | Status: DC | PRN
  Administered 2017-07-31: 10 mL
  Filled 2017-07-31: qty 10

## 2017-07-31 NOTE — Progress Notes (Signed)
[facility] OFFICE PROGRESS NOTE   Diagnosis: Breast cancer Oncology History   Cancer Staging Malignant neoplasm of lower-outer quadrant of left breast of female, estrogen receptor negative ([place]) Staging form: Breast, AJCC 8th Edition - Clinical: Stage IIB (cT2, cN0(f), cM0, G3, ER: Negative, PR: Negative, HER2: Negative) - Unsigned       Malignant neoplasm of lower-outer quadrant of left breast of female, estrogen receptor negative ([place])   04/27/2017 Mammogram    IMPRESSION: 1. Highly suspicious mass within the LEFT breast at the 5:30 o'clock axis, 4 cm from the nipple, measuring 2.5 cm, corresponding to the mammographic finding and corresponding to 1 of the palpable lumps in the left breast. Ultrasound-guided biopsy is recommended. 2. Mildly prominent lymph node in the LEFT axilla, with normal cortical thickness but with questionable effacement of the fatty hilum. Ultrasound-guided core biopsy is recommended. 3. No evidence of malignancy within the RIGHT breast. Also, no evidence of malignancy within the second area of clinical concern in the upper outer left breast.      04/29/2017 Initial Diagnosis    Malignant neoplasm of lower-outer quadrant of left breast of female, estrogen receptor negative ([place])      04/29/2017 Receptors her2    Estrogen Receptor: 0%, NEGATIVE Progesterone Receptor: 0%, NEGATIVE Proliferation Marker Ki67: 90% HER2 NEGATIVE       04/29/2017 Initial Biopsy     Diagnosis 1. 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 - SEE COMMENT. 2. Lymph node, needle/core biopsy, left axilla - THERE IN NO EVIDENCE OF CARCINOMA IN 1 OF 1 LYMPH NODE (0/1).      05/14/2017 Imaging    MRI of Breast Bilateral 05/14/17  IMPRESSION: 1. 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 2. Non mass enhancement extending approximately 3.2 cm anterior to the mass in the lower inner quadrant of the left breast, suspicious for DCIS. There is no correlate on recent mammography. The overall extent of the mass and non mass enhancement is approximately 5 cm. 3. No MRI evidence of malignancy involving the right breast. 4. No pathologic lymphadenopathy. Upper normal sized left axillary lymph nodes, the largest of which was previously biopsied no evidence of metastatic disease.      05/15/2017 Imaging    Bone scan whole body 05/15/17 IMPRESSION: No findings specific for metastatic disease to bone.      05/15/2017 Imaging    CT CAP W contrast 05/15/17  IMPRESSION: 1. 18 mm soft tissue lesion inferior left breast compatible with known primary malignancy. 2. Mildly enlarged left axillary lymph nodes in this patient with biopsy-proven metastatic disease to the left axilla. 3. No evidence for lymphadenopathy elsewhere in the chest. No lymphadenopathy in the abdomen or pelvis. No evidence for metastatic disease in the abdomen or pelvis. 4. Tiny right perifissural lung nodule most likely subpleural lymph node. Attention on follow-up recommended. 5. 7 mm hypoattenuating lesion in the spleen, likely a cyst or pseudocyst. Attention on follow-up recommended. 6. Uterine fibroids. 7.  Aortic Atherosclerois (ICD10-170.0)      05/19/2017 Procedure    Port placement by Dr. [name] on 9/11/8      05/22/2017 -  Chemotherapy    neoadjuvant chemotherpy AC every 2 weeks for 4 cycles, followed by paclitaxel every 2 weeks for 4 cycles starting 05/22/17        05/22/2017 Pathology  Results    Diagnosis Breast, left, needle core biopsy, lower inner quad - DUCTAL CARCINOMA IN SITU. 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 The genes analyzed were the 46 genes on [employer]'s Common Cancers panel (APC, ATM, AXIN2, BARD1, BMPR1A, BRCA1, BRCA2, BRIP1, CDH1, CDKN2A, CHEK2, CTNNA1, DICER1, EPCAM, GREM1, HOXB13, KIT, MEN1, MLH1, MSH2, MSH3, MSH6, MUTYH, NBN, NF1, NTHL1, PALB2, PDGFRA, PMS2, POLD1, POLE, PTEN, RAD50, RAD51C, RAD51D, SDHA, SDHB, SDHC, SDHD, SMAD4, SMARCA4, STK11, TP53, TSC1, TSC2, VHL).        CURRENT THERAPY: neoadjuvant chemotherpy AC every 2 weeks for 4 cycles starting 05/22/17, followed by weekly paclitaxel for 12 cycles starting 07/24/17    INTERVAL HISTORY:   Briana Price returns as scheduled.  She began weekly Taxol 07/24/2017.  She denies nausea/vomiting.  No mouth sores.  No diarrhea.  No rash.  She has a good appetite.  Objective:  Vital signs in last 24 hours:  Blood pressure (!) 150/91, pulse 79, temperature 98 F (36.7 C), temperature source Oral, resp. rate 18, height '5\' 6"'$  (1.676 m), weight 208 lb 8 oz (94.6 kg), SpO2 100 %.    HEENT: No thrush or ulcers. Resp: Lungs clear bilaterally. Cardio: Regular rate and rhythm. GI: Abdomen soft and nontender.  No hepatosplenomegaly. Vascular: No leg edema.  Calf soft and nontender. Breast: No mass palpated in the left breast. Port-A-Cath without erythema.   Lab Results:  Lab Results  Component Value Date   WBC 9.4 07/24/2017   HGB 10.4 (L) 07/24/2017   HCT 31.2 (L) 07/24/2017   MCV 82.8 07/24/2017   PLT 176 07/24/2017   NEUTROABS 7.3 (H) 07/24/2017    Imaging:  No results found.  Medications: I have reviewed the patient's current medications.  Assessment/Plan: 1. 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   Status post AC x4 05/22/2017 through 07/07/2017; initiation of weekly Taxol 07/24/2017. 2. Genetics.  She has seen a Retail buyer. 3. Hypertension.  On hydrochlorothiazide and  lisinopril. 4. Rosacea.  Topical steroid. 5. History of skin wound at the Port-A-Cath site and folliculitis upper chest.  Resolved.   Disposition: Briana Price appears stable.  She has completed 1 cycle of weekly Taxol.  Plan to proceed with cycle 2 today as scheduled.  She will return for a follow-up visit and cycle 3 in 1 week.  She will contact the office in the interim with any problems.    Ned Card ANP/GNP-BC   07/31/2017  2:21 PM

## 2017-07-31 NOTE — Patient Instructions (Signed)
[facility] Discharge Instructions for Patients Receiving Chemotherapy  Today you received the following chemotherapy agents Taxol  To help prevent nausea and vomiting after your treatment, we encourage you to take your nausea medication.    If you develop nausea and vomiting that is not controlled by your nausea medication, call the clinic.   BELOW ARE SYMPTOMS THAT SHOULD BE REPORTED IMMEDIATELY:  *FEVER GREATER THAN 100.5 F  *CHILLS WITH OR WITHOUT FEVER  NAUSEA AND VOMITING THAT IS NOT CONTROLLED WITH YOUR NAUSEA MEDICATION  *UNUSUAL SHORTNESS OF BREATH  *UNUSUAL BRUISING OR BLEEDING  TENDERNESS IN MOUTH AND THROAT WITH OR WITHOUT PRESENCE OF ULCERS  *URINARY PROBLEMS  *BOWEL PROBLEMS  UNUSUAL RASH Items with * indicate a potential emergency and should be followed up as soon as possible.  Feel free to call the clinic should you have any questions or concerns. The clinic phone number is [phone].  Please show the CHEMO ALERT CARD at check-in to the Emergency Department and triage nurse.   

## 2017-08-06 NOTE — Progress Notes (Signed)
[place]  [phone] [phone]  Clinic follow up Note   Patient Care Team: [name], MD as PCP - General (Family Medicine) [name], MD as Consulting Physician (Hematology) [name], MD as Consulting Physician (General Surgery) [name], MD as Consulting Physician (Radiation Oncology) 08/07/2017  CHIEF COMPLAINTS:  Malignant neoplasm of lower-outer quadrant of left breast of female, triple negative   Oncology History   Cancer Staging Malignant neoplasm of lower-outer quadrant of left breast of female, estrogen receptor negative ([place]) Staging form: Breast, AJCC 8th Edition - Clinical: Stage IIB (cT2, cN0(f), cM0, G3, ER: Negative, PR: Negative, HER2: Negative) - Unsigned       Malignant neoplasm of lower-outer quadrant of left breast of female, estrogen receptor negative ([place])   04/27/2017 Mammogram    IMPRESSION: 1. Highly suspicious mass within the LEFT breast at the 5:30 o'clock axis, 4 cm from the nipple, measuring 2.5 cm, corresponding to the mammographic finding and corresponding to 1 of the palpable lumps in the left breast. Ultrasound-guided biopsy is recommended. 2. Mildly prominent lymph node in the LEFT axilla, with normal cortical thickness but with questionable effacement of the fatty hilum. Ultrasound-guided core biopsy is recommended. 3. No evidence of malignancy within the RIGHT breast. Also, no evidence of malignancy within the second area of clinical concern in the upper outer left breast.      04/29/2017 Initial Diagnosis    Malignant neoplasm of lower-outer quadrant of left breast of female, estrogen receptor negative ([place])      04/29/2017 Receptors her2    Estrogen Receptor: 0%, NEGATIVE Progesterone Receptor: 0%, NEGATIVE Proliferation Marker Ki67: 90% HER2 NEGATIVE       04/29/2017 Initial Biopsy     Diagnosis 1. 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 - SEE COMMENT. 2. Lymph node, needle/core biopsy, left axilla - THERE IN NO EVIDENCE OF CARCINOMA IN 1 OF 1 LYMPH NODE (0/1).      05/14/2017 Imaging    MRI of Breast Bilateral 05/14/17  IMPRESSION: 1. 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 2. Non mass enhancement extending approximately 3.2 cm anterior to the mass in the lower inner quadrant of the left breast, suspicious for DCIS. There is no correlate on recent mammography. The overall extent of the mass and non mass enhancement is approximately 5 cm. 3. No MRI evidence of malignancy involving the right breast. 4. No pathologic lymphadenopathy. Upper normal sized left axillary lymph nodes, the largest of which was previously biopsied no evidence of metastatic disease.      05/15/2017 Imaging    Bone scan whole body 05/15/17 IMPRESSION: No findings specific for metastatic disease to bone.      05/15/2017 Imaging    CT CAP W contrast 05/15/17  IMPRESSION: 1. 18 mm soft tissue lesion inferior left breast compatible with known primary malignancy. 2. Mildly enlarged left axillary lymph nodes in this patient with biopsy-proven metastatic disease to the left axilla. 3. No evidence for lymphadenopathy elsewhere in the chest. No lymphadenopathy in the abdomen or pelvis. No evidence for metastatic disease in the abdomen or pelvis. 4. Tiny right perifissural lung nodule most likely subpleural lymph node. Attention on follow-up recommended. 5. 7 mm hypoattenuating lesion in the spleen, likely a cyst or pseudocyst. Attention on follow-up recommended. 6. Uterine fibroids. 7.  Aortic Atherosclerois (ICD10-170.0)  05/19/2017 Procedure    Port placement by Dr. [name] on 9/11/8      05/22/2017 -  Chemotherapy     neoadjuvant chemotherpy AC every 2 weeks for 4 cycles starting 05/22/17 and  completed on 07/07/17, followed by weekly paclitaxel for 12 cycles starting 07/24/17       05/22/2017 Pathology Results    Diagnosis Breast, left, needle core biopsy, lower inner quad - DUCTAL CARCINOMA IN SITU. 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  The genes analyzed were the 46 genes on [facility]'s Common Cancers panel (APC, ATM, AXIN2, BARD1, BMPR1A, BRCA1, BRCA2, BRIP1, CDH1, CDKN2A, CHEK2, CTNNA1, DICER1, EPCAM, GREM1, HOXB13, KIT, MEN1, MLH1, MSH2, MSH3, MSH6, MUTYH, NBN, NF1, NTHL1, PALB2, PDGFRA, PMS2, POLD1, POLE, PTEN, RAD50, RAD51C, RAD51D, SDHA, SDHB, SDHC, SDHD, SMAD4, SMARCA4, STK11, TP53, TSC1, TSC2, VHL).        HISTORY OF PRESENTING ILLNESS: 8/29/8  [name] 51 y.o. 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  She presents to the clinic today with her mother and father.  She first noticed her a mass in her left breast on the 17th of August, It was a hard knot, no pain or tenderness. Her mammogram form 03/24/17 and other previous mammograms were all normal.   In the past she was diagnosed with HTN and takes hydrochlorothiazide and lisinopril. She had her gallbladder removed. Her maternal grandmother, maternal aunt and paternal aunt had breast cancer. 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 The pain can last up to 3 days.   Today she denies skin change around breast, pain, fatigue or weight loss. She still has regular periods that last 3 days. She work in [employer] for the [employer].   GYN HISTORY  Menarchal: 14 LMP: 05/01/17 Contraceptive: No HRT: n/a GP: G1P1 at age 16, she has a son  CURRENT THERAPY:  neoadjuvant chemotherapy AC every 2 weeks for 4 cycles starting 05/22/17 and completed on 07/07/17, followed by weekly paclitaxel for 12 cycles starting 07/24/17 to complete 10/09/16.   INTERVAL  HISTORY:  ADRAIN BUTRICK is here for a follow up and cycle 3 of Taxol. She presents to the clinic today with her family.  She notes she has been tolerating Taxol better than AC. She denies neuropathy and has been trying to increase her water intake. She has received IV Fluids today. 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 She does not want the stress of answering the phone, so she is requesting a letter to not be assigned to answering calls. She will get more information on what needs to be mentioned in letter.        MEDICAL HISTORY:  Past Medical History:  Diagnosis Date  . Anemia   . Breast cancer ([place])   . Cancer [facility])    left breast cancer  . Family history of breast cancer   . Genetic testing 05/28/2017   Common Cancers panel (46 genes) @ [facility] - No pathogenic mutations detected  . GERD (gastroesophageal reflux disease)   . Headache    Migraines  . Hypertension    SURGICAL HISTORY: Past Surgical History:  Procedure Laterality Date  . BREAST SURGERY     biopsy  . CESAREAN SECTION    . CHOLECYSTECTOMY N/A 02/07/2013   Procedure: LAPAROSCOPIC CHOLECYSTECTOMY WITH INTRAOPERATIVE CHOLANGIOGRAM;  Surgeon: Gwenyth Ober, MD;  Location: [place];  Service: General;  Laterality: N/A;  . COLONOSCOPY W/ BIOPSIES AND POLYPECTOMY    . FOOT SURGERY  2003   lt foot bunionectomy  . MULTIPLE TOOTH EXTRACTIONS    . PORTACATH PLACEMENT Right 05/19/2017   Procedure: INSERTION PORT-A-CATH;  Surgeon: [name], MD;  Location: [place];  Service: General;  Laterality: Right;   SOCIAL HISTORY: Social History   Socioeconomic History  . Marital status: Single    Spouse name: Not on file  . Number of children: Not on file  . Years of education: Not on file  . Highest education level: Not on file  Social Needs  . Financial resource strain: Not on file  . Food insecurity - worry: Not on file  . Food insecurity - inability: Not on file  . Transportation needs -  medical: Not on file  . Transportation needs - non-medical: Not on file  Occupational History  . Not on file  Tobacco Use  . Smoking status: Former Smoker    Types: Cigarettes  . Smokeless tobacco: Never Used  . Tobacco comment: 15-20 years ago  Substance and Sexual Activity  . Alcohol use: No  . Drug use: No  . Sexual activity: Not on file  Other Topics Concern  . Not on file  Social History Narrative   Tobacco use   Cigarettes: Never smoked    Tobacco history last updated 01/23/2014   No smoking   No tobacco exposure   No alcohol   Caffeine : yes soda   No recreational drug use   Occupation :employed ,[employer] 01/25/2013   Martial status : single    Children: Louie Casa 01/25/2013   28 year old son   FAMILY HISTORY: Family History  Problem Relation Age of Onset  . Breast cancer Maternal Grandmother        dx 58s; deceased 28  . Breast cancer Paternal Aunt 26       recurrence vs. 2nd primary at 14; currently 23  . Breast cancer Maternal Aunt 62       deceased 83  . Hypertension Mother   . Breast cancer Other        pat grandfather's sister; dx 47s  . Leukemia Paternal Uncle        deceased 45  . Breast cancer Other        paternal distant cousin; dx 43s   ALLERGIES:  has No Known Allergies.  MEDICATIONS:  Current Outpatient Medications  Medication Sig Dispense Refill  . aspirin-acetaminophen-caffeine (EXCEDRIN MIGRAINE) 250-250-65 MG tablet Take 2 tablets by mouth 2 (two) times daily as needed for headache.    . Cholecalciferol (VITAMIN D) 2000 units CAPS Take 2,000 Units by mouth daily.    . fluocinonide ointment (LIDEX) 6.96 % Apply 1 application topically daily as needed (rosacea).    . hydrochlorothiazide (HYDRODIURIL) 25 MG tablet Take 25 mg by mouth daily.    Marland Kitchen lisinopril (PRINIVIL,ZESTRIL) 10 MG tablet Take 10 mgs by mouth daily  2  . potassium chloride SA (K-DUR,KLOR-CON) 20 MEQ tablet TAKE 1 TABLET BY MOUTH DAILY FOR 7 DAYS AS DIRECTED 90 tablet 0    . ciprofloxacin (CILOXAN) 0.3 % ophthalmic solution     . ciprofloxacin (CIPRO) 500 MG tablet Take 1 tablet (500 mg total) by mouth 2 (two) times daily. 6 tablet 0  . oxyCODONE (OXY IR/ROXICODONE) 5 MG immediate release tablet Take 1-2 tablets (5-10 mg  total) by mouth every 6 (six) hours as needed for moderate pain, severe pain or breakthrough pain. (Patient not taking: Reported on 07/31/2017) 20 tablet 0   No current facility-administered medications for this visit.    Facility-Administered Medications Ordered in Other Visits  Medication Dose Route Frequency Provider Last Rate Last Dose  . sodium chloride flush (NS) 0.9 % injection 10 mL  10 mL Intravenous PRN [name], MD   10 mL at 08/07/17 1354   REVIEW OF SYSTEMS:   Constitutional: Denies fevers, chills or abnormal night sweats  Eyes: Denies blurriness of vision, double vision or watery eyes Ears, nose, mouth, throat, and face: Denies mucositis or sore throat Respiratory: Denies cough, dyspnea or wheezes Cardiovascular: Denies palpitation, chest discomfort or lower extremity swelling (+) mild heart murmur Gastrointestinal:  Denies nausea, heartburn or change in bowel habits Skin: Denies abnormal skin rashes Lymphatics: Denies new lymphadenopathy or easy bruising Neurological:Denies numbness, tingling or new weaknesses MSK: (+) joint pain in hand  Behavioral/Psych: Mood is stable, no new changes. Breast: negative All other systems were reviewed with the patient and are negative.  PHYSICAL EXAMINATION: ECOG PERFORMANCE STATUS: 1 BP (!) 141/90 (BP Location: Left Arm, Patient Position: Sitting)   Pulse 87   Temp 98.4 F (36.9 C) (Oral)   Resp 18   Wt 206 lb (93.4 kg)   SpO2 100%   BMI 33.25 kg/m   Vitals:   08/07/17 1415  BP: (!) 141/90  Pulse: 87  Resp: 18  Temp: 98.4 F (36.9 C)  TempSrc: Oral  SpO2: 100%  Weight: 206 lb (93.4 kg)   GENERAL:alert, no distress and comfortable SKIN: skin color, texture, turgor are  normal, no rashes or significant lesions EYES: normal, conjunctiva are pink and non-injected, sclera clear OROPHARYNX:no exudate, no erythema and lips, buccal mucosa, and tongue normal  NECK: supple, thyroid normal size, non-tender, without nodularity LYMPH:  no palpable lymphadenopathy in the cervical, axillary or inguinal LUNGS: clear to auscultation and percussion with normal breathing effort HEART: regular rate and no lower extremity edema (+) mild Heart murmur, related to anemia  ABDOMEN:abdomen soft, non-tender and normal bowel sounds Musculoskeletal:no cyanosis of digits and no clubbing  PSYCH: alert & oriented x 3 with fluent speech NEURO: no focal motor/sensory deficits Breast: (+) no palpable mass in left axilla (+) Inner lower quadrant mass in 7:00 position of left breast is no longer palpable (+) no palpable mass in right breast or right axilla, normal   LABORATORY DATA:  I have reviewed the data as listed CBC Latest Ref Rng & Units 08/07/2017 07/31/2017 07/24/2017  WBC 3.9 - 10.3 10e3/uL 6.3 5.6 9.4  Hemoglobin 11.6 - 15.9 g/dL 10.6(L) 10.2(L) 10.4(L)  Hematocrit 34.8 - 46.6 % 32.4(L) 31.3(L) 31.2(L)  Platelets 145 - 400 10e3/uL 385 358 176   CMP Latest Ref Rng & Units 08/07/2017 07/31/2017 07/24/2017  Glucose 70 - 140 mg/dl 108 130 110  BUN 7.0 - 26.0 mg/dL 7.7 8.1 6.3(L)  Creatinine 0.6 - 1.1 mg/dL 0.7 0.7 0.7  Sodium 136 - 145 mEq/L 139 140 139  Potassium 3.5 - 5.1 mEq/L 3.4(L) 3.6 3.3(L)  Chloride 96 - 112 mEq/L - - -  CO2 22 - 29 mEq/L _0 Calcium 8.4 - 10.4 mg/dL 9.8 9.4 9.7  Total Protein 6.4 - 8.3 g/dL 7.5 7.1 7.2  Total Bilirubin 0.20 - 1.20 mg/dL 0.56 0.32 0.39  Alkaline Phos 40 - 150 U/L 57 53 70  AST 5 - 34 U/L  29 34 31  ALT 0 - 55 U/L 41 41 27   PATHOLOGY  Diagnosis 04/29/17 1. Breast, left, needle core biopsy, 5:30 o'clock, mass - INVASIVE DUCTAL CARCINOMA. - DUCTAL CARCINOMA IN SITU. - SEE COMMENT. 2. Lymph node, needle/core biopsy, left  axilla - THERE IN NO EVIDENCE OF CARCINOMA IN 1 OF 1 LYMPH NODE (0/1). Microscopic Comment 1. The carcinoma appears grade 2-3. A breast prognostic profile will be performed and the results reported separately. The results were called to [facility] on 04/30/17. (JBK:gt, 04/30/17) 1. PROGNOSTIC INDICATORS Results: IMMUNOHISTOCHEMICAL AND MORPHOMETRIC ANALYSIS PERFORMED MANUALLY Estrogen Receptor: 0%, NEGATIVE Progesterone Receptor: 0%, NEGATIVE Proliferation Marker Ki67: 90% COMMENT: The negative hormone receptor study(ies) in this case has an internal positive control. 1. FLUORESCENCE IN-SITU HYBRIDIZATION Results: HER2 - NEGATIVE RATIO OF HER2/CEP17 SIGNALS 1.11 AVERAGE HER2 COPY NUMBER PER CELL 2.1    PROCEDURES   ECHO 05/14/17 Study Conclusions - Left ventricle: The cavity size was normal. Systolic function was normal. The estimated ejection fraction was in the range of 55% to 60%. Wall motion was normal; there were no regional wall motion abnormalities. Left ventricular diastolic function parameters were normal.  RADIOGRAPHIC STUDIES: I have personally reviewed the radiological images as listed and agreed with the findings in the report.  Bone scan whole body 05/15/17 IMPRESSION: No findings specific for metastatic disease to bone.  CT CAP W contrast 05/15/17  IMPRESSION: 1. 18 mm soft tissue lesion inferior left breast compatible with known primary malignancy. 2. Mildly enlarged left axillary lymph nodes in this patient with biopsy-proven metastatic disease to the left axilla. 3. No evidence for lymphadenopathy elsewhere in the chest. No lymphadenopathy in the abdomen or pelvis. No evidence for metastatic disease in the abdomen or pelvis. 4. Tiny right perifissural lung nodule most likely subpleural lymph node. Attention on follow-up recommended. 5. 7 mm hypoattenuating lesion in the spleen, likely a cyst or pseudocyst. Attention on follow-up  recommended. 6. Uterine fibroids. 7.  Aortic Atherosclerois (ICD10-170.0)  No results found. ASSESSMENT & PLAN:  Briana Price is a 51 y.o. african-american female with a history of HTN and arthritis in her back, presented with a palpable left breast mass.  1. 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  -We discussed her mammogram, ultrasound findings, and initial biopsy results with patient and her family members in details.  -She presented with a palpable left breast mass, measures 2.5 cm on ultrasound, with a prominent lymph node. 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 -She is a candidate for lumpectomy and sentinel lymph node biopsy. She was seen by surgeon Dr. [name] today. Due to her young age and triple negative disease, we recommended her to see genetics, to ruled out inheritable genetic syndrome. -I reviewed her breast MRI findings, which showed additional non-mass enhancement in the left breast, which is anterior to the biopsy proven breast cancer, in the lower inner quadrant. I recommend her to have additional biopsy, she agrees, we'll schedule for this week. -I reviewed her CT scan and bone scan findings in great detail, no evidence of distant metastasis. -both MRI and a CT scan showed enlarged left axillary lymph node, but biopsy was negative for malignancy, although I still have some concern that she may have metastatic adenopathy. -She agreed to proceed with neoadjuvant chemotherapy. She has concerns about the secondary  malignancy from Adriamycin. However given the extensive of her disease, I strongly encouraged her to take Adriamycin and Cytoxan, followed by Taxol.  --She underwent AC with Onpro on 05/22/17-07/07/17. She has tolerated well overall. After second cycle she developed Leukocytosis secondary  to Onpro. Onpro was skipped with her Cycle 3 treatment, cycle 4 was pushed back 1 week due to neutropenia.  -She has had good clinical response to chemotherapy. Her left breast mass is not palpable anymore.  -She started weekly taxol on 07/24/17 and has been tolerating well so far.  -I discussed her mild heart murmur is likely related to her anemia. Her 05/2017 ECHO shows no structural issues with her valves.  -Labs reviewed, mild anemia with Hg at 10.6, potassium at 3.4. Overall labs are adequate to proceed with weekly taxol treatment.  -F/u in 2 weeks   2. Genetics -Based on her significant familial breast cancer history, her young age and triple negative disease,  I suggest she gets genetic testing for possible gene mutations.  -Apt with Roma Kayser in genetics on 05/28/17 -Positive for Variant of Uncertain Significance: ATM, but negative for other 46 genes on [facility]'s Common Cancers panel (APC, ATM, AXIN2, BARD1, BMPR1A, BRCA1, BRCA2, BRIP1, CDH1, CDKN2A, CHEK2, CTNNA1, DICER1, EPCAM, GREM1, HOXB13, KIT, MEN1, MLH1, MSH2, MSH3, MSH6, MUTYH, NBN, NF1, NTHL1, PALB2, PDGFRA, PMS2, POLD1, POLE, PTEN, RAD50, RAD51C, RAD51D, SDHA, SDHB, SDHC, SDHD, SMAD4, SMARCA4, STK11, TP53, TSC1, TSC2, VHL).  3. HTN -she is on HCTZ and lisinopril , we'll continue, she will follow-up with her primary care physician  -We discussed the impact of chemotherapy, blood pressure, and a possibility that we may hold her blood pressure medication if she gets dehydrated.   4. Rosacea -Dermatologist prescribed topical steroid -OK to use during chemotherapy; she will also receive IV steroid which may help control  5. Skin wound at [facility] site and folliculitis in front upper chest, resolved -drained and cultured on 06/05/17  -patient afebrile without signs of systemic infection, but is immune compromised while undergoing chemotherapy -Given bactrim DS BID x10 days for MRSA coverage prescribed 06/05/17 -Resolved now   PLAN:    -Continue weekly Taxol -F/u with NP Lacie in 2 weeks    -I provided her a letter for her work   No orders of the defined types were placed in this encounter.  All questions were answered. The patient knows to call the clinic with any problems, questions or concerns. I spent 20 minutes counseling the patient face to face. The total time spent in the appointment was 25 minutes and more than 50% was on counseling.  This document serves as a record of services personally performed by [name], MD. It was created on her behalf by Joslyn Devon, a trained medical scribe. The creation of this record is based on the scribe's personal observations and the provider's statements to them.    I have reviewed the above documentation for accuracy and completeness, and I agree with the above.     [name], MD 08/09/2017

## 2017-08-07 ENCOUNTER — Ambulatory Visit: Payer: Federal, State, Local not specified - PPO

## 2017-08-07 ENCOUNTER — Ambulatory Visit (HOSPITAL_BASED_OUTPATIENT_CLINIC_OR_DEPARTMENT_OTHER): Payer: Federal, State, Local not specified - PPO | Admitting: Hematology

## 2017-08-07 ENCOUNTER — Encounter: Payer: Self-pay | Admitting: Hematology

## 2017-08-07 ENCOUNTER — Ambulatory Visit (HOSPITAL_BASED_OUTPATIENT_CLINIC_OR_DEPARTMENT_OTHER): Payer: Federal, State, Local not specified - PPO

## 2017-08-07 ENCOUNTER — Other Ambulatory Visit: Payer: Federal, State, Local not specified - PPO

## 2017-08-07 ENCOUNTER — Other Ambulatory Visit (HOSPITAL_BASED_OUTPATIENT_CLINIC_OR_DEPARTMENT_OTHER): Payer: Federal, State, Local not specified - PPO

## 2017-08-07 VITALS — BP 141/90 | HR 87 | Temp 98.4°F | Resp 18 | Wt 206.0 lb

## 2017-08-07 DIAGNOSIS — I10 Essential (primary) hypertension: Secondary | ICD-10-CM | POA: Diagnosis not present

## 2017-08-07 DIAGNOSIS — C50512 Malignant neoplasm of lower-outer quadrant of left female breast: Secondary | ICD-10-CM | POA: Diagnosis not present

## 2017-08-07 DIAGNOSIS — Z5111 Encounter for antineoplastic chemotherapy: Secondary | ICD-10-CM

## 2017-08-07 DIAGNOSIS — Z95828 Presence of other vascular implants and grafts: Secondary | ICD-10-CM

## 2017-08-07 DIAGNOSIS — Z171 Estrogen receptor negative status [ER-]: Secondary | ICD-10-CM | POA: Diagnosis not present

## 2017-08-07 DIAGNOSIS — C773 Secondary and unspecified malignant neoplasm of axilla and upper limb lymph nodes: Secondary | ICD-10-CM | POA: Diagnosis not present

## 2017-08-07 LAB — COMPREHENSIVE METABOLIC PANEL
ALBUMIN: 3.9 g/dL (ref 3.5–5.0)
ALK PHOS: 57 U/L (ref 40–150)
ALT: 41 U/L (ref 0–55)
AST: 29 U/L (ref 5–34)
Anion Gap: 9 mEq/L (ref 3–11)
BUN: 7.7 mg/dL (ref 7.0–26.0)
CALCIUM: 9.8 mg/dL (ref 8.4–10.4)
CO2: 28 mEq/L (ref 22–29)
CREATININE: 0.7 mg/dL (ref 0.6–1.1)
Chloride: 102 mEq/L (ref 98–109)
EGFR: >60 mL/min/{1.73_m2} (ref >60)
GLUCOSE: 108 mg/dL (ref 70–140)
Potassium: 3.4 mEq/L — ABNORMAL LOW (ref 3.5–5.1)
SODIUM: 139 meq/L (ref 136–145)
TOTAL PROTEIN: 7.5 g/dL (ref 6.4–8.3)
Total Bilirubin: 0.56 mg/dL (ref 0.20–1.20)

## 2017-08-07 LAB — CBC WITH DIFFERENTIAL/PLATELET
BASO%: 1.4 % (ref 0.0–2.0)
BASOS ABS: 0.1 10*3/uL (ref 0.0–0.1)
EOS ABS: 0.2 10*3/uL (ref 0.0–0.5)
EOS%: 2.7 % (ref 0.0–7.0)
HEMATOCRIT: 32.4 % — AB (ref 34.8–46.6)
HEMOGLOBIN: 10.6 g/dL — AB (ref 11.6–15.9)
LYMPH%: 19.7 % (ref 14.0–49.7)
MCH: 27.8 pg (ref 25.1–34.0)
MCHC: 32.7 g/dL (ref 31.5–36.0)
MCV: 85 fL (ref 79.5–101.0)
MONO#: 0.5 10*3/uL (ref 0.1–0.9)
MONO%: 7.6 % (ref 0.0–14.0)
NEUT%: 68.6 % (ref 38.4–76.8)
NEUTROS ABS: 4.3 10*3/uL (ref 1.5–6.5)
NRBC: 0 % (ref 0–0)
PLATELETS: 385 10*3/uL (ref 145–400)
RBC: 3.81 10*6/uL (ref 3.70–5.45)
RDW: 17.1 % — AB (ref 11.2–14.5)
WBC: 6.3 10*3/uL (ref 3.9–10.3)
lymph#: 1.2 10*3/uL (ref 0.9–3.3)

## 2017-08-07 MED ORDER — DIPHENHYDRAMINE HCL 50 MG/ML IJ SOLN
25.0000 mg | Freq: Once | INTRAMUSCULAR | Status: AC
  Administered 2017-08-07: 25 mg via INTRAVENOUS

## 2017-08-07 MED ORDER — CIPROFLOXACIN HCL 500 MG PO TABS: 500.0000 mg | ORAL_TABLET | Freq: Two times a day (BID) | ORAL | 0 refills | Status: DC

## 2017-08-07 MED ORDER — DIPHENHYDRAMINE HCL 50 MG/ML IJ SOLN
INTRAMUSCULAR | Status: AC
  Filled 2017-08-07: qty 1

## 2017-08-07 MED ORDER — FAMOTIDINE IN NACL 20-0.9 MG/50ML-% IV SOLN
20.0000 mg | Freq: Once | INTRAVENOUS | Status: AC
  Administered 2017-08-07: 20 mg via INTRAVENOUS

## 2017-08-07 MED ORDER — SODIUM CHLORIDE 0.9% FLUSH
10.0000 mL | INTRAVENOUS | Status: DC | PRN
  Administered 2017-08-07: 10 mL
  Filled 2017-08-07: qty 10

## 2017-08-07 MED ORDER — SODIUM CHLORIDE 0.9 % IV SOLN
80.0000 mg/m2 | Freq: Once | INTRAVENOUS | Status: AC
  Administered 2017-08-07: 168 mg via INTRAVENOUS
  Filled 2017-08-07: qty 28

## 2017-08-07 MED ORDER — DEXAMETHASONE SODIUM PHOSPHATE 10 MG/ML IJ SOLN
INTRAMUSCULAR | Status: AC
  Filled 2017-08-07: qty 1

## 2017-08-07 MED ORDER — SODIUM CHLORIDE 0.9 % IV SOLN
Freq: Once | INTRAVENOUS | Status: AC
Start: 2017-08-07 — End: 2017-08-07
  Administered 2017-08-07: 15:00:00 via INTRAVENOUS

## 2017-08-07 MED ORDER — HEPARIN SOD (PORK) LOCK FLUSH 100 UNIT/ML IV SOLN
500.0000 [IU] | Freq: Once | INTRAVENOUS | Status: AC | PRN
  Administered 2017-08-07: 500 [IU]
  Filled 2017-08-07: qty 5

## 2017-08-07 MED ORDER — DEXAMETHASONE SODIUM PHOSPHATE 10 MG/ML IJ SOLN
10.0000 mg | Freq: Once | INTRAMUSCULAR | Status: AC
  Administered 2017-08-07: 10 mg via INTRAVENOUS

## 2017-08-07 MED ORDER — SODIUM CHLORIDE 0.9 % IV SOLN: 10.0000 mg | Freq: Once | INTRAVENOUS | Status: DC

## 2017-08-07 MED ORDER — SODIUM CHLORIDE 0.9% FLUSH
10.0000 mL | INTRAVENOUS | Status: AC | PRN
  Administered 2017-08-07: 10 mL via INTRAVENOUS
  Filled 2017-08-07: qty 10

## 2017-08-07 MED ORDER — FAMOTIDINE IN NACL 20-0.9 MG/50ML-% IV SOLN
INTRAVENOUS | Status: AC
  Filled 2017-08-07: qty 50

## 2017-08-07 NOTE — Patient Instructions (Signed)
[facility] Discharge Instructions for Patients Receiving Chemotherapy  Today you received the following chemotherapy agents:  Taxol.  To help prevent nausea and vomiting after your treatment, we encourage you to take your nausea medication as directed.   If you develop nausea and vomiting that is not controlled by your nausea medication, call the clinic.   BELOW ARE SYMPTOMS THAT SHOULD BE REPORTED IMMEDIATELY:  *FEVER GREATER THAN 100.5 F  *CHILLS WITH OR WITHOUT FEVER  NAUSEA AND VOMITING THAT IS NOT CONTROLLED WITH YOUR NAUSEA MEDICATION  *UNUSUAL SHORTNESS OF BREATH  *UNUSUAL BRUISING OR BLEEDING  TENDERNESS IN MOUTH AND THROAT WITH OR WITHOUT PRESENCE OF ULCERS  *URINARY PROBLEMS  *BOWEL PROBLEMS  UNUSUAL RASH Items with * indicate a potential emergency and should be followed up as soon as possible.  Feel free to call the clinic should you have any questions or concerns. The clinic phone number is [phone].  Please show the CHEMO ALERT CARD at check-in to the Emergency Department and triage nurse.   

## 2017-08-07 NOTE — Patient Instructions (Signed)
Implanted Port Home Guide An implanted port is a type of central line that is placed under the skin. Central lines are used to provide IV access when treatment or nutrition needs to be given through a person's veins. Implanted ports are used for long-term IV access. An implanted port may be placed because:  You need IV medicine that would be irritating to the small veins in your hands or arms.  You need long-term IV medicines, such as antibiotics.  You need IV nutrition for a long period.  You need frequent blood draws for lab tests.  You need dialysis.  Implanted ports are usually placed in the chest area, but they can also be placed in the upper arm, the abdomen, or the leg. An implanted port has two main parts:  Reservoir. The reservoir is round and will appear as a small, raised area under your skin. The reservoir is the part where a needle is inserted to give medicines or draw blood.  Catheter. The catheter is a thin, flexible tube that extends from the reservoir. The catheter is placed into a large vein. Medicine that is inserted into the reservoir goes into the catheter and then into the vein.  How will I care for my incision site? Do not get the incision site wet. Bathe or shower as directed by your health care provider. How is my port accessed? Special steps must be taken to access the port:  Before the port is accessed, a numbing cream can be placed on the skin. This helps numb the skin over the port site.  Your health care provider uses a sterile technique to access the port. ? Your health care provider must put on a mask and sterile gloves. ? The skin over your port is cleaned carefully with an antiseptic and allowed to dry. ? The port is gently pinched between sterile gloves, and a needle is inserted into the port.  Only "non-coring" port needles should be used to access the port. Once the port is accessed, a blood return should be checked. This helps ensure that the port  is in the vein and is not clogged.  If your port needs to remain accessed for a constant infusion, a clear (transparent) bandage will be placed over the needle site. The bandage and needle will need to be changed every week, or as directed by your health care provider.  Keep the bandage covering the needle clean and dry. Do not get it wet. Follow your health care provider's instructions on how to take a shower or bath while the port is accessed.  If your port does not need to stay accessed, no bandage is needed over the port.  What is flushing? Flushing helps keep the port from getting clogged. Follow your health care provider's instructions on how and when to flush the port. Ports are usually flushed with saline solution or a medicine called heparin. The need for flushing will depend on how the port is used.  If the port is used for intermittent medicines or blood draws, the port will need to be flushed: ? After medicines have been given. ? After blood has been drawn. ? As part of routine maintenance.  If a constant infusion is running, the port may not need to be flushed.  How long will my port stay implanted? The port can stay in for as long as your health care provider thinks it is needed. When it is time for the port to come out, surgery will be   done to remove it. The procedure is similar to the one performed when the port was put in. When should I seek immediate medical care? When you have an implanted port, you should seek immediate medical care if:  You notice a bad smell coming from the incision site.  You have swelling, redness, or drainage at the incision site.  You have more swelling or pain at the port site or the surrounding area.  You have a fever that is not controlled with medicine.  This information is not intended to replace advice given to you by your health care provider. Make sure you discuss any questions you have with your health care provider. Document  Released: 08/25/2005 Document Revised: 01/31/2016 Document Reviewed: 05/02/2013 Elsevier Interactive Patient Education  2017 [employer]  

## 2017-08-09 ENCOUNTER — Encounter: Payer: Self-pay | Admitting: Hematology

## 2017-08-10 ENCOUNTER — Telehealth: Payer: Self-pay | Admitting: Hematology

## 2017-08-10 NOTE — Telephone Encounter (Signed)
Spoke to patient regarding upcoming January appointment updates per 11/30 los.

## 2017-08-11 ENCOUNTER — Other Ambulatory Visit (HOSPITAL_COMMUNITY): Payer: Federal, State, Local not specified - PPO

## 2017-08-11 ENCOUNTER — Ambulatory Visit (HOSPITAL_COMMUNITY): Payer: Federal, State, Local not specified - PPO | Admitting: Internal Medicine

## 2017-08-14 ENCOUNTER — Ambulatory Visit (HOSPITAL_COMMUNITY)
Admission: RE | Admit: 2017-08-14 | Discharge: 2017-08-14 | Disposition: A | Discharge status: Home / Self Care | Payer: Federal, State, Local not specified - PPO | Source: Ambulatory Visit | Attending: Internal Medicine | Admitting: Internal Medicine

## 2017-08-14 ENCOUNTER — Other Ambulatory Visit: Payer: Self-pay | Admitting: Hematology

## 2017-08-14 ENCOUNTER — Other Ambulatory Visit (HOSPITAL_BASED_OUTPATIENT_CLINIC_OR_DEPARTMENT_OTHER): Payer: Federal, State, Local not specified - PPO

## 2017-08-14 ENCOUNTER — Ambulatory Visit (HOSPITAL_BASED_OUTPATIENT_CLINIC_OR_DEPARTMENT_OTHER): Payer: Federal, State, Local not specified - PPO

## 2017-08-14 ENCOUNTER — Ambulatory Visit (HOSPITAL_BASED_OUTPATIENT_CLINIC_OR_DEPARTMENT_OTHER)
Admission: RE | Admit: 2017-08-14 | Discharge: 2017-08-14 | Disposition: A | Discharge status: Home / Self Care | Payer: Federal, State, Local not specified - PPO | Source: Ambulatory Visit | Attending: Internal Medicine | Admitting: Internal Medicine

## 2017-08-14 ENCOUNTER — Other Ambulatory Visit: Payer: Self-pay | Admitting: *Deleted

## 2017-08-14 ENCOUNTER — Ambulatory Visit: Payer: Federal, State, Local not specified - PPO

## 2017-08-14 VITALS — BP 135/80 | HR 85 | Temp 98.6°F | Resp 18

## 2017-08-14 VITALS — BP 120/82 | HR 80 | Wt 207.2 lb

## 2017-08-14 DIAGNOSIS — Z803 Family history of malignant neoplasm of breast: Secondary | ICD-10-CM | POA: Diagnosis not present

## 2017-08-14 DIAGNOSIS — C50512 Malignant neoplasm of lower-outer quadrant of left female breast: Secondary | ICD-10-CM | POA: Diagnosis not present

## 2017-08-14 DIAGNOSIS — I10 Essential (primary) hypertension: Secondary | ICD-10-CM | POA: Insufficient documentation

## 2017-08-14 DIAGNOSIS — C50912 Malignant neoplasm of unspecified site of left female breast: Secondary | ICD-10-CM | POA: Insufficient documentation

## 2017-08-14 DIAGNOSIS — Z171 Estrogen receptor negative status [ER-]: Secondary | ICD-10-CM

## 2017-08-14 DIAGNOSIS — C773 Secondary and unspecified malignant neoplasm of axilla and upper limb lymph nodes: Secondary | ICD-10-CM | POA: Diagnosis not present

## 2017-08-14 DIAGNOSIS — E876 Hypokalemia: Secondary | ICD-10-CM

## 2017-08-14 DIAGNOSIS — Z9221 Personal history of antineoplastic chemotherapy: Secondary | ICD-10-CM | POA: Insufficient documentation

## 2017-08-14 DIAGNOSIS — Z87891 Personal history of nicotine dependence: Secondary | ICD-10-CM | POA: Diagnosis not present

## 2017-08-14 DIAGNOSIS — Z95828 Presence of other vascular implants and grafts: Secondary | ICD-10-CM

## 2017-08-14 DIAGNOSIS — Z5111 Encounter for antineoplastic chemotherapy: Secondary | ICD-10-CM | POA: Diagnosis not present

## 2017-08-14 LAB — COMPREHENSIVE METABOLIC PANEL
ALT: 37 U/L (ref 0–55)
AST: 30 U/L (ref 5–34)
Albumin: 3.8 g/dL (ref 3.5–5.0)
Alkaline Phosphatase: 55 U/L (ref 40–150)
Anion Gap: 11 mEq/L (ref 3–11)
BUN: 8.4 mg/dL (ref 7.0–26.0)
CHLORIDE: 103 meq/L (ref 98–109)
CO2: 26 meq/L (ref 22–29)
Calcium: 9.5 mg/dL (ref 8.4–10.4)
Creatinine: 0.7 mg/dL (ref 0.6–1.1)
EGFR: >60 mL/min/{1.73_m2} (ref >60)
GLUCOSE: 126 mg/dL (ref 70–140)
POTASSIUM: 3.2 meq/L — AB (ref 3.5–5.1)
SODIUM: 141 meq/L (ref 136–145)
Total Bilirubin: 0.66 mg/dL (ref 0.20–1.20)
Total Protein: 7.1 g/dL (ref 6.4–8.3)

## 2017-08-14 LAB — CBC WITH DIFFERENTIAL/PLATELET
BASO%: 1.3 % (ref 0.0–2.0)
BASOS ABS: 0.1 10*3/uL (ref 0.0–0.1)
EOS ABS: 0.2 10*3/uL (ref 0.0–0.5)
EOS%: 3.1 % (ref 0.0–7.0)
HEMATOCRIT: 31.7 % — AB (ref 34.8–46.6)
HGB: 10.4 g/dL — ABNORMAL LOW (ref 11.6–15.9)
LYMPH%: 19.5 % (ref 14.0–49.7)
MCH: 28.1 pg (ref 25.1–34.0)
MCHC: 32.8 g/dL (ref 31.5–36.0)
MCV: 85.7 fL (ref 79.5–101.0)
MONO#: 0.3 10*3/uL (ref 0.1–0.9)
MONO%: 6.1 % (ref 0.0–14.0)
NEUT%: 70 % (ref 38.4–76.8)
NEUTROS ABS: 3.7 10*3/uL (ref 1.5–6.5)
PLATELETS: 384 10*3/uL (ref 145–400)
RBC: 3.7 10*6/uL (ref 3.70–5.45)
RDW: 16.9 % — ABNORMAL HIGH (ref 11.2–14.5)
WBC: 5.2 10*3/uL (ref 3.9–10.3)
lymph#: 1 10*3/uL (ref 0.9–3.3)
nRBC: 0 % (ref 0–0)

## 2017-08-14 MED ORDER — POTASSIUM CHLORIDE CRYS ER 20 MEQ PO TBCR: 20.0000 meq | EXTENDED_RELEASE_TABLET | Freq: Every day | ORAL | 0 refills | Status: DC

## 2017-08-14 MED ORDER — FAMOTIDINE IN NACL 20-0.9 MG/50ML-% IV SOLN
INTRAVENOUS | Status: AC
  Filled 2017-08-14: qty 50

## 2017-08-14 MED ORDER — HEPARIN SOD (PORK) LOCK FLUSH 100 UNIT/ML IV SOLN
500.0000 [IU] | Freq: Once | INTRAVENOUS | Status: AC | PRN
  Administered 2017-08-14: 500 [IU]
  Filled 2017-08-14: qty 5

## 2017-08-14 MED ORDER — DEXAMETHASONE SODIUM PHOSPHATE 10 MG/ML IJ SOLN
10.0000 mg | Freq: Once | INTRAMUSCULAR | Status: AC
  Administered 2017-08-14: 10 mg via INTRAVENOUS

## 2017-08-14 MED ORDER — DEXAMETHASONE SODIUM PHOSPHATE 10 MG/ML IJ SOLN
INTRAMUSCULAR | Status: AC
  Filled 2017-08-14: qty 1

## 2017-08-14 MED ORDER — SODIUM CHLORIDE 0.9 % IV SOLN
80.0000 mg/m2 | Freq: Once | INTRAVENOUS | Status: AC
  Administered 2017-08-14: 168 mg via INTRAVENOUS
  Filled 2017-08-14: qty 28

## 2017-08-14 MED ORDER — FAMOTIDINE IN NACL 20-0.9 MG/50ML-% IV SOLN
20.0000 mg | Freq: Once | INTRAVENOUS | Status: AC
  Administered 2017-08-14: 20 mg via INTRAVENOUS

## 2017-08-14 MED ORDER — SODIUM CHLORIDE 0.9% FLUSH
10.0000 mL | INTRAVENOUS | Status: DC | PRN
  Administered 2017-08-14: 10 mL via INTRAVENOUS
  Filled 2017-08-14: qty 10

## 2017-08-14 MED ORDER — DIPHENHYDRAMINE HCL 50 MG/ML IJ SOLN
25.0000 mg | Freq: Once | INTRAMUSCULAR | Status: AC
  Administered 2017-08-14: 25 mg via INTRAVENOUS

## 2017-08-14 MED ORDER — SODIUM CHLORIDE 0.9% FLUSH
10.0000 mL | INTRAVENOUS | Status: DC | PRN
  Administered 2017-08-14: 10 mL
  Filled 2017-08-14: qty 10

## 2017-08-14 MED ORDER — SODIUM CHLORIDE 0.9 % IV SOLN
Freq: Once | INTRAVENOUS | Status: AC
  Administered 2017-08-14: 14:00:00 via INTRAVENOUS

## 2017-08-14 MED ORDER — DIPHENHYDRAMINE HCL 50 MG/ML IJ SOLN
INTRAMUSCULAR | Status: AC
  Filled 2017-08-14: qty 1

## 2017-08-14 NOTE — Progress Notes (Signed)
  Echocardiogram 2D Echocardiogram has been performed.  Briana Price 08/14/2017, 8:41 AM

## 2017-08-14 NOTE — Progress Notes (Signed)
CARDIO-ONCOLOGY CLINIC CONSULT NOTE  Referring Physician: Dr. Burr Medico   HPI:  Ms. Briana Price is 51 y.o. female with h/o HTN and triple negative left breast cancer diagnoses in 8/18 who is referred by Dr. Burr Medico  for enrollment into the Cardio-Oncology program.   Pathology: Stage IIB (cT2, cN0(f), cM0, G3, ER: Negative, PR: Negative, HER2: Negative)   Chemotherapy regimen:  1) Neoadjuvant chemotherpy AC every 2 weeks for 4 cycles starting 05/22/17 and completed on 07/07/17, followed by weekly paclitaxel for 12 cycles starting 07/24/17   She denies any h/o known heart disease. No SOB, CP or edema. Has completed 4 cycles of AC and now getting Taxol.    ECHO 08/14/17: EF 65% RV normal. Grade I DD Strain ~17.0 (appears underestimated) Reviewed personally.   Review of Systems: [y] = yes, _0  = no   General: Weight gain _1 ; Weight loss _2 ; Anorexia _3 ; Fatigue _4 ; Fever _5 ; Chills _6 ; Weakness _7   Cardiac: Chest pain/pressure _8 ; Resting SOB _9 ; Exertional SOB _10 ; Orthopnea _11 ; Pedal Edema _12 ; Palpitations _13 ; Syncope _14 ; Presyncope _15 ; Paroxysmal nocturnal dyspnea_16   Pulmonary: Cough _17 ; Wheezing_18 ; Hemoptysis_19 ; Sputum _20 ; Snoring _21   GI: Vomiting_22 ; Dysphagia_23 ; Melena_24 ; Hematochezia _25 ; Heartburn_26 ; Abdominal pain _27 ; Constipation _28 ; Diarrhea _29 ; BRBPR _30   GU: Hematuria_31 ; Dysuria _32 ; Nocturia_33   Vascular: Pain in legs with walking _34 ; Pain in feet with lying flat _35 ; Non-healing sores _36 ; Stroke _37 ; TIA _38 ; Slurred speech _39 ;  Neuro: Headaches[y ]; Vertigo_40 ; Seizures_41 ; Paresthesias_42 ;Blurred vision _43 ; Diplopia _44 ; Vision changes _45   Ortho/Skin: Arthritis _46 ; Joint pain _47 ; Muscle pain _48 ; Joint swelling _49 ; Back Pain _50 ; Rash _51   Psych: Depression_52 ; Anxiety_53   Heme: Bleeding problems _54 ; Clotting disorders _55 ; Anemia Blue.Reese ]  Endocrine: Diabetes _56 ; Thyroid dysfunction_57    Past Medical History:  Diagnosis Date  . Anemia   .  Breast cancer ([place])   . Cancer [facility])    left breast cancer  . Family history of breast cancer   . Genetic testing 05/28/2017   Common Cancers panel (46 genes) @ [facility] - No pathogenic mutations detected  . GERD (gastroesophageal reflux disease)   . Headache    Migraines  . Hypertension     Current Outpatient Medications  Medication Sig Dispense Refill  . aspirin-acetaminophen-caffeine (EXCEDRIN MIGRAINE) 250-250-65 MG tablet Take 2 tablets by mouth 2 (two) times daily as needed for headache.    . Cholecalciferol (VITAMIN D) 2000 units CAPS Take 2,000 Units by mouth daily.    . ciprofloxacin (CILOXAN) 0.3 % ophthalmic solution     . fluocinonide ointment (LIDEX) 7.20 % Apply 1 application topically daily as needed (rosacea).    . hydrochlorothiazide (HYDRODIURIL) 25 MG tablet Take 25 mg by mouth daily.    Marland Kitchen lisinopril (PRINIVIL,ZESTRIL) 10 MG tablet Take 10 mgs by mouth daily  2  . ondansetron (ZOFRAN) 8 MG tablet Take 8 mg by mouth every 8 (eight) hours as needed for nausea or vomiting.    Marland Kitchen oxyCODONE (OXY IR/ROXICODONE) 5 MG immediate release tablet Take 1-2 tablets (5-10 mg total) by mouth every 6 (six) hours as needed for moderate pain, severe pain or breakthrough pain. 20 tablet 0   No  current facility-administered medications for this encounter.    Facility-Administered Medications Ordered in Other Encounters  Medication Dose Route Frequency Provider Last Rate Last Dose  . sodium chloride flush (NS) 0.9 % injection 10 mL  10 mL Intravenous PRN [name], MD   10 mL at 08/07/17 1354    No Known Allergies    Social History   Socioeconomic History  . Marital status: Single    Spouse name: Not on file  . Number of children: Not on file  . Years of education: Not on file  . Highest education level: Not on file  Social Needs  . Financial resource strain: Not on file  . Food insecurity - worry: Not on file  . Food insecurity - inability: Not on file  . Transportation needs  - medical: Not on file  . Transportation needs - non-medical: Not on file  Occupational History  . Not on file  Tobacco Use  . Smoking status: Former Smoker    Types: Cigarettes  . Smokeless tobacco: Never Used  . Tobacco comment: 15-20 years ago  Substance and Sexual Activity  . Alcohol use: No  . Drug use: No  . Sexual activity: Not on file  Other Topics Concern  . Not on file  Social History Narrative   Tobacco use   Cigarettes: Never smoked    Tobacco history last updated 01/23/2014   No smoking   No tobacco exposure   No alcohol   Caffeine : yes soda   No recreational drug use   Occupation :employed ,[employer] 01/25/2013   Martial status : single    Children: [employer] Casa 01/25/2013   83 year old son      Family History  Problem Relation Age of Onset  . Breast cancer Maternal Grandmother        dx 24s; deceased 90  . Breast cancer Paternal Aunt 37       recurrence vs. 2nd primary at 72; currently 63  . Breast cancer Maternal Aunt 62       deceased 12  . Hypertension Mother   . Breast cancer Other        pat grandfather's sister; dx 50s  . Leukemia Paternal Uncle        deceased 69  . Breast cancer Other        paternal distant cousin; dx 54s    Vitals:   08/14/17 0913  BP: 120/82  Pulse: 80  SpO2: 100%  Weight: 207 lb 4 oz (94 kg)    PHYSICAL EXAM: General:  Well appearing. No respiratory difficulty HEENT: normal Neck: supple. no JVD. Carotids 2+ bilat; no bruits. No lymphadenopathy or thryomegaly appreciated. Cor: PMI nondisplaced. Regular rate & rhythm. Very brief systolic murmur at RUSB. + port-a-cath. Lungs: clear Abdomen: obese, soft, nontender, nondistended. No hepatosplenomegaly. No bruits or masses. Good bowel sounds. Extremities: no cyanosis, clubbing, rash, edema. Discoloration of fingernails from chemo Neuro: alert & oriented x 3, cranial nerves grossly intact. moves all 4 extremities w/o difficulty. Affect pleasant.   ASSESSMENT &  PLAN: 1. Left Breast Cancer - Triple negative. Stage IIB - Has completed 4 cycles of AC chemo.   I reviewed echos personally. EF and Doppler parameters stable. No HF on exam. Will see back in 6 months for repeat echo to ensure stability. I explained incidence of Adriamycin cardiotoxicity in detail include small possibility of very delayed toxicity.   2. HTN - Well controlled. Continue current regimen.   Quillian Quince  Bensimhon, MD  9:52 AM

## 2017-08-14 NOTE — Patient Instructions (Signed)
We will contact you in 6 months to schedule your next appointment and echocardiogram  

## 2017-08-14 NOTE — Patient Instructions (Signed)
[facility] Discharge Instructions for Patients Receiving Chemotherapy  Today you received the following chemotherapy agents:  Taxol.  To help prevent nausea and vomiting after your treatment, we encourage you to take your nausea medication as directed.   If you develop nausea and vomiting that is not controlled by your nausea medication, call the clinic.   BELOW ARE SYMPTOMS THAT SHOULD BE REPORTED IMMEDIATELY:  *FEVER GREATER THAN 100.5 F  *CHILLS WITH OR WITHOUT FEVER  NAUSEA AND VOMITING THAT IS NOT CONTROLLED WITH YOUR NAUSEA MEDICATION  *UNUSUAL SHORTNESS OF BREATH  *UNUSUAL BRUISING OR BLEEDING  TENDERNESS IN MOUTH AND THROAT WITH OR WITHOUT PRESENCE OF ULCERS  *URINARY PROBLEMS  *BOWEL PROBLEMS  UNUSUAL RASH Items with * indicate a potential emergency and should be followed up as soon as possible.  Feel free to call the clinic should you have any questions or concerns. The clinic phone number is [phone].  Please show the CHEMO ALERT CARD at check-in to the Emergency Department and triage nurse.   

## 2017-08-20 NOTE — Progress Notes (Signed)
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 Highly suspicious mass within the LEFT breast at the 5:30 o'clock axis, 4 cm from the nipple, measuring 2.5 cm, corresponding to the mammographic finding and corresponding to 1 of the palpable lumps in the left breast. Ultrasound-guided biopsy is recommended. 2. Mildly prominent lymph node in the LEFT axilla, with normal cortical thickness but with questionable effacement of the fatty hilum. Ultrasound-guided core biopsy is recommended. 3. No evidence of malignancy within the RIGHT breast. Also, no evidence of malignancy within the second area of clinical concern in the upper outer left breast.      04/29/2017 Initial Diagnosis    Malignant neoplasm of lower-outer quadrant of left breast of female, estrogen receptor negative ([place])      04/29/2017 Receptors her2    Estrogen Receptor: 0%, NEGATIVE Progesterone Receptor: 0%, NEGATIVE Proliferation Marker Ki67: 90% HER2 NEGATIVE       04/29/2017 Initial Biopsy     Diagnosis 1. 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 - SEE COMMENT. 2. Lymph node, needle/core biopsy, left axilla - THERE IN NO EVIDENCE OF CARCINOMA IN 1 OF 1 LYMPH NODE (0/1).      05/14/2017 Imaging    MRI of Breast Bilateral 05/14/17  IMPRESSION: 1. 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 2. Non mass enhancement extending approximately 3.2 cm anterior to the mass in the lower inner quadrant of the left breast, suspicious for DCIS. There is no correlate on recent mammography. The overall extent of the mass and non mass enhancement is approximately 5 cm. 3. No MRI evidence of malignancy involving the right breast. 4. No pathologic lymphadenopathy. Upper normal sized left axillary lymph nodes, the largest of which was previously biopsied no evidence of metastatic disease.      05/15/2017 Imaging    Bone scan whole body 05/15/17 IMPRESSION: No findings specific for metastatic disease to bone.      05/15/2017 Imaging    CT CAP W contrast 05/15/17  IMPRESSION: 1. 18 mm soft tissue lesion inferior left breast compatible with known primary malignancy. 2. Mildly enlarged left axillary lymph nodes in this patient with biopsy-proven metastatic disease to the left axilla. 3. No evidence for lymphadenopathy elsewhere in the chest. No lymphadenopathy in the abdomen or pelvis. No evidence for metastatic disease in the abdomen or pelvis. 4. Tiny right perifissural lung nodule most likely subpleural lymph node. Attention on follow-up recommended. 5. 7 mm hypoattenuating lesion in the spleen, likely a cyst or pseudocyst. Attention on follow-up recommended. 6. Uterine fibroids.  7.  Aortic Atherosclerois (ICD10-170.0)      05/19/2017 Procedure    Port placement by Dr. [name] on 9/11/8      05/22/2017 -  Chemotherapy     neoadjuvant chemotherpy AC every 2 weeks  for 4 cycles starting 05/22/17 and completed on 07/07/17, followed by weekly paclitaxel for 12 cycles starting 07/24/17       05/22/2017 Pathology Results    Diagnosis Breast, left, needle core biopsy, lower inner quad - DUCTAL CARCINOMA IN SITU. 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  The genes analyzed were the 46 genes on [facility]'s Common Cancers panel (APC, ATM, AXIN2, BARD1, BMPR1A, BRCA1, BRCA2, BRIP1, CDH1, CDKN2A, CHEK2, CTNNA1, DICER1, EPCAM, GREM1, HOXB13, KIT, MEN1, MLH1, MSH2, MSH3, MSH6, MUTYH, NBN, NF1, NTHL1, PALB2, PDGFRA, PMS2, POLD1, POLE, PTEN, RAD50, RAD51C, RAD51D, SDHA, SDHB, SDHC, SDHD, SMAD4, SMARCA4, STK11, TP53, TSC1, TSC2, VHL).        08/14/2017 Echocardiogram    EF 60-65%  Study Conclusions  - Left ventricle: The cavity size was normal. Systolic function was   normal. The estimated ejection fraction was in the range of 60%   to 65%. Wall motion was normal; there were no regional wall   motion abnormalities. Doppler parameters are consistent with   abnormal left ventricular relaxation (grade 1 diastolic   dysfunction). - Pulmonary arteries: PA peak pressure: 31 mm Hg (S).  Impressions:  - No change from September 2018 study (strain rate not performed on   that study).        CURRENT THERAPY:  neoadjuvant chemotherapy AC every 2 weeks for 4 cycles starting 05/22/17 and completed on 07/07/17, followed by weekly paclitaxel for 12 cycles starting 07/24/17 to complete 10/09/16.    INTERVAL HISTORY: Ms. Briana Price returns for f/u as scheduled prior to cycle 5 weekly taxol. She has no specific complaints today. She is tolerating this regimen much better than AC. She has enough energy to go to work everyday, eating and drinking well. Bowels are regular and soft, denies diarrhea, nausea, or vomiting. Has some dry skin to palms and nail  darkening, no numbness or tingling to hands or feet. Denies changes or new masses to breasts.   REVIEW OF SYSTEMS:   Constitutional: Denies fatigue, fevers, chills or abnormal weight loss (+) good appetite  Eyes: Denies blurriness of vision Ears, nose, mouth, throat, and face: Denies mucositis or sore throat Respiratory: Denies cough, dyspnea or wheezes Cardiovascular: Denies palpitation, chest discomfort or lower extremity swelling Gastrointestinal:  Denies nausea, vomiting, constipation, diarrhea, heartburn or change in bowel habits (+) soft BM daily, sometimes twice  Skin: Denies abnormal skin rashes (+) dryness to palms (+) nail darkening  Lymphatics: Denies new lymphadenopathy or easy bruising Neurological:Denies numbness, tingling or new weaknesses Behavioral/Psych: Mood is stable, no new changes  Breasts: denies lump or changes  All other systems were reviewed with the patient and are negative.  MEDICAL HISTORY:  Past Medical History:  Diagnosis Date  . Anemia   . Breast cancer ([place])   . Cancer [facility])    left breast cancer  . Family history of breast cancer   . Genetic testing 05/28/2017   Common Cancers panel (46 genes) @ [facility] - No pathogenic mutations detected  . GERD (gastroesophageal reflux disease)   . Headache    Migraines  . Hypertension     SURGICAL HISTORY:  Past Surgical History:  Procedure Laterality Date  . BREAST SURGERY     biopsy  . CESAREAN SECTION    . CHOLECYSTECTOMY N/A 02/07/2013   Procedure: LAPAROSCOPIC CHOLECYSTECTOMY WITH INTRAOPERATIVE CHOLANGIOGRAM;  Surgeon: Gwenyth Ober, MD;  Location: [place];  Service: General;  Laterality: N/A;  . COLONOSCOPY W/ BIOPSIES AND POLYPECTOMY    . FOOT SURGERY  2003   lt foot bunionectomy  . MULTIPLE TOOTH EXTRACTIONS    . PORTACATH PLACEMENT Right 05/19/2017   Procedure: INSERTION PORT-A-CATH;  Surgeon: [name], MD;  Location: [place];  Service: General;  Laterality: Right;    I  have reviewed the social history and family history with the patient and they are unchanged from previous note.  ALLERGIES:  has No Known Allergies.  MEDICATIONS:  Current Outpatient Medications  Medication Sig Dispense Refill  . aspirin-acetaminophen-caffeine (EXCEDRIN MIGRAINE) 250-250-65 MG tablet Take 2 tablets by mouth 2 (two) times daily as needed for headache.    . Cholecalciferol (VITAMIN D) 2000 units CAPS Take 2,000 Units by mouth daily.    . ciprofloxacin (CILOXAN) 0.3 % ophthalmic solution     . fluocinonide ointment (LIDEX) 7.01 % Apply 1 application topically daily as needed (rosacea).    . hydrochlorothiazide (HYDRODIURIL) 25 MG tablet Take 25 mg by mouth daily.    Marland Kitchen lisinopril (PRINIVIL,ZESTRIL) 10 MG tablet Take 10 mgs by mouth daily  2  . potassium chloride SA (K-DUR,KLOR-CON) 20 MEQ tablet Take 1 tablet (20 mEq total) by mouth daily. 30 tablet 0  . ondansetron (ZOFRAN) 8 MG tablet Take 8 mg by mouth every 8 (eight) hours as needed for nausea or vomiting.    Marland Kitchen oxyCODONE (OXY IR/ROXICODONE) 5 MG immediate release tablet Take 1-2 tablets (5-10 mg total) by mouth every 6 (six) hours as needed for moderate pain, severe pain or breakthrough pain. (Patient not taking: Reported on 08/21/2017) 20 tablet 0   No current facility-administered medications for this visit.    Facility-Administered Medications Ordered in Other Visits  Medication Dose Route Frequency Provider Last Rate Last Dose  . 0.9 %  sodium chloride infusion   Intravenous Once [name], MD      . dexamethasone (DECADRON) injection 10 mg  10 mg Intravenous Once [name], MD   10 mg at 08/21/17 1451  . famotidine (PEPCID) IVPB 20 mg premix  20 mg Intravenous Once [name], MD      . heparin lock flush 100 unit/mL  500 Units Intracatheter Once PRN [name], MD      . PACLitaxel (TAXOL) 168 mg in sodium chloride 0.9 % 250 mL chemo infusion (</= 93m/m2)  80 mg/m2 (Treatment Plan Recorded) Intravenous Once FTruitt Merle  MD      . sodium chloride flush (NS) 0.9 % injection 10 mL  10 mL Intravenous PRN FTruitt Merle MD   10 mL at 08/07/17 1354  . sodium chloride flush (NS) 0.9 % injection 10 mL  10 mL Intracatheter PRN FTruitt Merle MD        PHYSICAL EXAMINATION: ECOG PERFORMANCE STATUS: 1 - Symptomatic but completely ambulatory  Vitals:   08/21/17 1344  BP: 127/78  Pulse: 89  Resp: 20  Temp: 97.9 F (36.6 C)  SpO2: 100%   Filed Weights   08/21/17 1344  Weight: 210 lb 14.4 oz (95.7 kg)    GENERAL:alert, no distress and comfortable SKIN: skin color, texture, turgor are normal, no rashes or significant lesions EYES: normal, Conjunctiva are pink  and non-injected, sclera clear OROPHARYNX:no exudate, no erythema and lips, buccal mucosa, and tongue normal  NECK: supple, thyroid normal size, non-tender, without nodularity LYMPH:  no palpable cervical, supraclavicular, or axillary lymphadenopathy  LUNGS: clear to auscultation and percussion with normal breathing effort HEART: regular rate & rhythm and no murmurs and no lower extremity edema ABDOMEN:abdomen soft, non-tender and normal bowel sounds Musculoskeletal:no cyanosis of digits and no clubbing  NEURO: alert & oriented x 3 with fluent speech, no focal motor/sensory deficits BREASTS: No palpable mass in left axilla; no palpable mass in lower inner quadrant of left breast that was previously palpable at 7 o'clock. No palpable mass in right breast or axilla  PAC without erythema  LABORATORY DATA:  I have reviewed the data as listed CBC Latest Ref Rng & Units 08/21/2017 08/14/2017 08/07/2017  WBC 3.9 - 10.3 10e3/uL 5.7 5.2 6.3  Hemoglobin 11.6 - 15.9 g/dL 10.8(L) 10.4(L) 10.6(L)  Hematocrit 34.8 - 46.6 % 32.8(L) 31.7(L) 32.4(L)  Platelets 145 - 400 10e3/uL 317 384 385     CMP Latest Ref Rng & Units 08/21/2017 08/14/2017 08/07/2017  Glucose 70 - 140 mg/dl 133 126 108  BUN 7.0 - 26.0 mg/dL 7.7 8.4 7.7  Creatinine 0.6 - 1.1 mg/dL 0.7 0.7 0.7  Sodium  136 - 145 mEq/L 140 141 139  Potassium 3.5 - 5.1 mEq/L 3.4(L) 3.2(L) 3.4(L)  Chloride 96 - 112 mEq/L - - -  CO2 22 - 29 mEq/L _0 Calcium 8.4 - 10.4 mg/dL 9.6 9.5 9.8  Total Protein 6.4 - 8.3 g/dL 7.3 7.1 7.5  Total Bilirubin 0.20 - 1.20 mg/dL 0.62 0.66 0.56  Alkaline Phos 40 - 150 U/L 59 55 57  AST 5 - 34 U/L _1 ALT 0 - 55 U/L 40 37 41    RADIOGRAPHIC STUDIES: I have personally reviewed the radiological images as listed and agreed with the findings in the report. No results found.   ASSESSMENT & PLAN: Briana Price is a 51 y.o. african-american female with a history of HTN and arthritis in her back, presented with a palpable left breast mass.  1. 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  2. Genetics - positive for VUS: ATM but negative for other 46 genes on [facility]'s Common Cancers Panel  3. HTN 4. Rosacea 5. Skin wound at [facility] site with folliculitis in front upper chest; resolved   Ms. Prochnow appears stable. She has completed 4 cycles of neoadjuvant weekly taxol; she is tolerating very well overall. She continues to have good clinical response to chemotherapy, as the mass in lower inner quadrant of left breast is not palpable. VS and weight stable. Labs reviewed, mild anemia Hgb at 10.8, potassium at 3.4. She will continue oral K 20 mEq daily for mild hypokalemia, refilled today. Labs are stable and adequate to proceed with weekly taxol treatment through 10/09/16 which will be followed by surgery. Return in 1 week for taxol and f/u in 2 weeks.  PLAN Continue weekly taxol  F/u Lattie Haw NP in 2 weeks Continue K supplementation daily    All questions were answered. The patient knows to call the clinic with any problems, questions or concerns. No barriers to learning was detected.    Alla Feeling, NP 08/21/17

## 2017-08-21 ENCOUNTER — Ambulatory Visit (HOSPITAL_BASED_OUTPATIENT_CLINIC_OR_DEPARTMENT_OTHER): Payer: Federal, State, Local not specified - PPO

## 2017-08-21 ENCOUNTER — Ambulatory Visit: Payer: Federal, State, Local not specified - PPO

## 2017-08-21 ENCOUNTER — Telehealth: Payer: Self-pay | Admitting: Nurse Practitioner

## 2017-08-21 ENCOUNTER — Ambulatory Visit (HOSPITAL_BASED_OUTPATIENT_CLINIC_OR_DEPARTMENT_OTHER): Payer: Federal, State, Local not specified - PPO | Admitting: Nurse Practitioner

## 2017-08-21 ENCOUNTER — Encounter: Payer: Self-pay | Admitting: Nurse Practitioner

## 2017-08-21 VITALS — BP 127/78 | HR 89 | Temp 97.9°F | Resp 20 | Ht 66.0 in | Wt 210.9 lb

## 2017-08-21 DIAGNOSIS — C773 Secondary and unspecified malignant neoplasm of axilla and upper limb lymph nodes: Secondary | ICD-10-CM

## 2017-08-21 DIAGNOSIS — C50512 Malignant neoplasm of lower-outer quadrant of left female breast: Secondary | ICD-10-CM

## 2017-08-21 DIAGNOSIS — Z5111 Encounter for antineoplastic chemotherapy: Secondary | ICD-10-CM

## 2017-08-21 DIAGNOSIS — I10 Essential (primary) hypertension: Secondary | ICD-10-CM | POA: Diagnosis not present

## 2017-08-21 DIAGNOSIS — Z171 Estrogen receptor negative status [ER-]: Secondary | ICD-10-CM | POA: Diagnosis not present

## 2017-08-21 DIAGNOSIS — Z95828 Presence of other vascular implants and grafts: Secondary | ICD-10-CM

## 2017-08-21 DIAGNOSIS — E876 Hypokalemia: Secondary | ICD-10-CM

## 2017-08-21 LAB — CBC WITH DIFFERENTIAL/PLATELET
BASO%: 1.1 % (ref 0.0–2.0)
BASOS ABS: 0.1 10*3/uL (ref 0.0–0.1)
EOS ABS: 0.2 10*3/uL (ref 0.0–0.5)
EOS%: 2.6 % (ref 0.0–7.0)
HCT: 32.8 % — ABNORMAL LOW (ref 34.8–46.6)
HEMOGLOBIN: 10.8 g/dL — AB (ref 11.6–15.9)
LYMPH%: 16.2 % (ref 14.0–49.7)
MCH: 28.4 pg (ref 25.1–34.0)
MCHC: 32.9 g/dL (ref 31.5–36.0)
MCV: 86.3 fL (ref 79.5–101.0)
MONO#: 0.4 10*3/uL (ref 0.1–0.9)
MONO%: 6.7 % (ref 0.0–14.0)
NEUT#: 4.2 10*3/uL (ref 1.5–6.5)
NEUT%: 73.4 % (ref 38.4–76.8)
Platelets: 317 10*3/uL (ref 145–400)
RBC: 3.8 10*6/uL (ref 3.70–5.45)
RDW: 16.7 % — AB (ref 11.2–14.5)
WBC: 5.7 10*3/uL (ref 3.9–10.3)
lymph#: 0.9 10*3/uL (ref 0.9–3.3)

## 2017-08-21 LAB — COMPREHENSIVE METABOLIC PANEL
ALT: 40 U/L (ref 0–55)
AST: 27 U/L (ref 5–34)
Albumin: 3.9 g/dL (ref 3.5–5.0)
Alkaline Phosphatase: 59 U/L (ref 40–150)
Anion Gap: 11 mEq/L (ref 3–11)
BUN: 7.7 mg/dL (ref 7.0–26.0)
CHLORIDE: 103 meq/L (ref 98–109)
CO2: 26 meq/L (ref 22–29)
CREATININE: 0.7 mg/dL (ref 0.6–1.1)
Calcium: 9.6 mg/dL (ref 8.4–10.4)
EGFR: >60 mL/min/{1.73_m2} (ref >60)
GLUCOSE: 133 mg/dL (ref 70–140)
Potassium: 3.4 mEq/L — ABNORMAL LOW (ref 3.5–5.1)
SODIUM: 140 meq/L (ref 136–145)
TOTAL PROTEIN: 7.3 g/dL (ref 6.4–8.3)
Total Bilirubin: 0.62 mg/dL (ref 0.20–1.20)

## 2017-08-21 MED ORDER — DEXAMETHASONE SODIUM PHOSPHATE 10 MG/ML IJ SOLN
10.0000 mg | Freq: Once | INTRAMUSCULAR | Status: AC
  Administered 2017-08-21: 10 mg via INTRAVENOUS

## 2017-08-21 MED ORDER — FAMOTIDINE IN NACL 20-0.9 MG/50ML-% IV SOLN
INTRAVENOUS | Status: AC
  Filled 2017-08-21: qty 50

## 2017-08-21 MED ORDER — DEXAMETHASONE SODIUM PHOSPHATE 10 MG/ML IJ SOLN
INTRAMUSCULAR | Status: AC
  Filled 2017-08-21: qty 1

## 2017-08-21 MED ORDER — SODIUM CHLORIDE 0.9 % IV SOLN
80.0000 mg/m2 | Freq: Once | INTRAVENOUS | Status: AC
  Administered 2017-08-21: 168 mg via INTRAVENOUS
  Filled 2017-08-21: qty 28

## 2017-08-21 MED ORDER — SODIUM CHLORIDE 0.9 % IV SOLN
Freq: Once | INTRAVENOUS | Status: AC
  Administered 2017-08-21: 15:00:00 via INTRAVENOUS

## 2017-08-21 MED ORDER — HEPARIN SOD (PORK) LOCK FLUSH 100 UNIT/ML IV SOLN
500.0000 [IU] | Freq: Once | INTRAVENOUS | Status: AC | PRN
  Administered 2017-08-21: 500 [IU]
  Filled 2017-08-21: qty 5

## 2017-08-21 MED ORDER — SODIUM CHLORIDE 0.9% FLUSH
10.0000 mL | INTRAVENOUS | Status: DC | PRN
  Administered 2017-08-21: 10 mL via INTRAVENOUS
  Filled 2017-08-21: qty 10

## 2017-08-21 MED ORDER — SODIUM CHLORIDE 0.9% FLUSH
10.0000 mL | INTRAVENOUS | Status: DC | PRN
  Administered 2017-08-21: 10 mL
  Filled 2017-08-21: qty 10

## 2017-08-21 MED ORDER — DIPHENHYDRAMINE HCL 50 MG/ML IJ SOLN
INTRAMUSCULAR | Status: AC
  Filled 2017-08-21: qty 1

## 2017-08-21 MED ORDER — DIPHENHYDRAMINE HCL 50 MG/ML IJ SOLN
25.0000 mg | Freq: Once | INTRAMUSCULAR | Status: AC
  Administered 2017-08-21: 25 mg via INTRAVENOUS

## 2017-08-21 MED ORDER — FAMOTIDINE IN NACL 20-0.9 MG/50ML-% IV SOLN
20.0000 mg | Freq: Once | INTRAVENOUS | Status: AC
  Administered 2017-08-21: 20 mg via INTRAVENOUS

## 2017-08-21 NOTE — Telephone Encounter (Signed)
Scheduled appt per 12/14 los - Gave patient aVS an calender per los.

## 2017-08-21 NOTE — Patient Instructions (Signed)
[facility] Discharge Instructions for Patients Receiving Chemotherapy  Today you received the following chemotherapy agents:  Taxol.  To help prevent nausea and vomiting after your treatment, we encourage you to take your nausea medication as directed.   If you develop nausea and vomiting that is not controlled by your nausea medication, call the clinic.   BELOW ARE SYMPTOMS THAT SHOULD BE REPORTED IMMEDIATELY:  *FEVER GREATER THAN 100.5 F  *CHILLS WITH OR WITHOUT FEVER  NAUSEA AND VOMITING THAT IS NOT CONTROLLED WITH YOUR NAUSEA MEDICATION  *UNUSUAL SHORTNESS OF BREATH  *UNUSUAL BRUISING OR BLEEDING  TENDERNESS IN MOUTH AND THROAT WITH OR WITHOUT PRESENCE OF ULCERS  *URINARY PROBLEMS  *BOWEL PROBLEMS  UNUSUAL RASH Items with * indicate a potential emergency and should be followed up as soon as possible.  Feel free to call the clinic should you have any questions or concerns. The clinic phone number is [phone].  Please show the CHEMO ALERT CARD at check-in to the Emergency Department and triage nurse.   

## 2017-08-28 ENCOUNTER — Other Ambulatory Visit (HOSPITAL_BASED_OUTPATIENT_CLINIC_OR_DEPARTMENT_OTHER): Payer: Federal, State, Local not specified - PPO

## 2017-08-28 ENCOUNTER — Ambulatory Visit (HOSPITAL_BASED_OUTPATIENT_CLINIC_OR_DEPARTMENT_OTHER): Payer: Federal, State, Local not specified - PPO

## 2017-08-28 ENCOUNTER — Ambulatory Visit: Payer: Federal, State, Local not specified - PPO

## 2017-08-28 VITALS — BP 129/85 | HR 81 | Temp 98.9°F | Resp 18

## 2017-08-28 DIAGNOSIS — C773 Secondary and unspecified malignant neoplasm of axilla and upper limb lymph nodes: Secondary | ICD-10-CM | POA: Diagnosis not present

## 2017-08-28 DIAGNOSIS — C50512 Malignant neoplasm of lower-outer quadrant of left female breast: Secondary | ICD-10-CM

## 2017-08-28 DIAGNOSIS — Z171 Estrogen receptor negative status [ER-]: Principal | ICD-10-CM

## 2017-08-28 DIAGNOSIS — Z5111 Encounter for antineoplastic chemotherapy: Secondary | ICD-10-CM | POA: Diagnosis not present

## 2017-08-28 DIAGNOSIS — Z95828 Presence of other vascular implants and grafts: Secondary | ICD-10-CM

## 2017-08-28 LAB — COMPREHENSIVE METABOLIC PANEL
ALT: 24 U/L (ref 0–55)
AST: 21 U/L (ref 5–34)
Albumin: 3.5 g/dL (ref 3.5–5.0)
Alkaline Phosphatase: 58 U/L (ref 40–150)
Anion Gap: 11 mEq/L (ref 3–11)
BUN: 8 mg/dL (ref 7.0–26.0)
CALCIUM: 8.6 mg/dL (ref 8.4–10.4)
CHLORIDE: 103 meq/L (ref 98–109)
CO2: 24 meq/L (ref 22–29)
CREATININE: 0.7 mg/dL (ref 0.6–1.1)
EGFR: >60 mL/min/{1.73_m2} (ref >60)
GLUCOSE: 117 mg/dL (ref 70–140)
POTASSIUM: 3.5 meq/L (ref 3.5–5.1)
SODIUM: 137 meq/L (ref 136–145)
Total Bilirubin: 0.55 mg/dL (ref 0.20–1.20)
Total Protein: 6.7 g/dL (ref 6.4–8.3)

## 2017-08-28 LAB — CBC WITH DIFFERENTIAL/PLATELET
BASO%: 0.5 % (ref 0.0–2.0)
BASOS ABS: 0 10*3/uL (ref 0.0–0.1)
EOS ABS: 0.2 10*3/uL (ref 0.0–0.5)
EOS%: 2.6 % (ref 0.0–7.0)
HCT: 32 % — ABNORMAL LOW (ref 34.8–46.6)
HGB: 10.4 g/dL — ABNORMAL LOW (ref 11.6–15.9)
LYMPH%: 14.9 % (ref 14.0–49.7)
MCH: 28.3 pg (ref 25.1–34.0)
MCHC: 32.5 g/dL (ref 31.5–36.0)
MCV: 87.2 fL (ref 79.5–101.0)
MONO#: 0.5 10*3/uL (ref 0.1–0.9)
MONO%: 8.1 % (ref 0.0–14.0)
NEUT#: 4.3 10*3/uL (ref 1.5–6.5)
NEUT%: 73.9 % (ref 38.4–76.8)
PLATELETS: 295 10*3/uL (ref 145–400)
RBC: 3.67 10*6/uL — AB (ref 3.70–5.45)
RDW: 16.5 % — ABNORMAL HIGH (ref 11.2–14.5)
WBC: 5.8 10*3/uL (ref 3.9–10.3)
lymph#: 0.9 10*3/uL (ref 0.9–3.3)

## 2017-08-28 MED ORDER — DIPHENHYDRAMINE HCL 50 MG/ML IJ SOLN
25.0000 mg | Freq: Once | INTRAMUSCULAR | Status: AC
  Administered 2017-08-28: 25 mg via INTRAVENOUS

## 2017-08-28 MED ORDER — SODIUM CHLORIDE 0.9% FLUSH
10.0000 mL | INTRAVENOUS | Status: DC | PRN
  Administered 2017-08-28: 10 mL
  Filled 2017-08-28: qty 10

## 2017-08-28 MED ORDER — DEXAMETHASONE SODIUM PHOSPHATE 10 MG/ML IJ SOLN
INTRAMUSCULAR | Status: AC
  Filled 2017-08-28: qty 1

## 2017-08-28 MED ORDER — FAMOTIDINE IN NACL 20-0.9 MG/50ML-% IV SOLN
INTRAVENOUS | Status: AC
  Filled 2017-08-28: qty 50

## 2017-08-28 MED ORDER — HEPARIN SOD (PORK) LOCK FLUSH 100 UNIT/ML IV SOLN
500.0000 [IU] | Freq: Once | INTRAVENOUS | Status: AC | PRN
  Administered 2017-08-28: 500 [IU]
  Filled 2017-08-28: qty 5

## 2017-08-28 MED ORDER — SODIUM CHLORIDE 0.9% FLUSH
10.0000 mL | INTRAVENOUS | Status: DC | PRN
  Administered 2017-08-28: 10 mL via INTRAVENOUS
  Filled 2017-08-28: qty 10

## 2017-08-28 MED ORDER — SODIUM CHLORIDE 0.9 % IV SOLN
80.0000 mg/m2 | Freq: Once | INTRAVENOUS | Status: AC
  Administered 2017-08-28: 168 mg via INTRAVENOUS
  Filled 2017-08-28: qty 28

## 2017-08-28 MED ORDER — FAMOTIDINE IN NACL 20-0.9 MG/50ML-% IV SOLN
20.0000 mg | Freq: Once | INTRAVENOUS | Status: AC
  Administered 2017-08-28: 20 mg via INTRAVENOUS

## 2017-08-28 MED ORDER — SODIUM CHLORIDE 0.9 % IV SOLN
Freq: Once | INTRAVENOUS | Status: AC
  Administered 2017-08-28: 15:00:00 via INTRAVENOUS

## 2017-08-28 MED ORDER — DEXAMETHASONE SODIUM PHOSPHATE 10 MG/ML IJ SOLN
10.0000 mg | Freq: Once | INTRAMUSCULAR | Status: AC
  Administered 2017-08-28: 10 mg via INTRAVENOUS

## 2017-08-28 MED ORDER — DIPHENHYDRAMINE HCL 50 MG/ML IJ SOLN
INTRAMUSCULAR | Status: AC
  Filled 2017-08-28: qty 1

## 2017-08-28 NOTE — Patient Instructions (Signed)
[facility] Discharge Instructions for Patients Receiving Chemotherapy  Today you received the following chemotherapy agents:  Taxol.  To help prevent nausea and vomiting after your treatment, we encourage you to take your nausea medication as directed.   If you develop nausea and vomiting that is not controlled by your nausea medication, call the clinic.   BELOW ARE SYMPTOMS THAT SHOULD BE REPORTED IMMEDIATELY:  *FEVER GREATER THAN 100.5 F  *CHILLS WITH OR WITHOUT FEVER  NAUSEA AND VOMITING THAT IS NOT CONTROLLED WITH YOUR NAUSEA MEDICATION  *UNUSUAL SHORTNESS OF BREATH  *UNUSUAL BRUISING OR BLEEDING  TENDERNESS IN MOUTH AND THROAT WITH OR WITHOUT PRESENCE OF ULCERS  *URINARY PROBLEMS  *BOWEL PROBLEMS  UNUSUAL RASH Items with * indicate a potential emergency and should be followed up as soon as possible.  Feel free to call the clinic should you have any questions or concerns. The clinic phone number is [phone].  Please show the CHEMO ALERT CARD at check-in to the Emergency Department and triage nurse.   

## 2017-09-04 ENCOUNTER — Other Ambulatory Visit (HOSPITAL_BASED_OUTPATIENT_CLINIC_OR_DEPARTMENT_OTHER): Payer: Federal, State, Local not specified - PPO

## 2017-09-04 ENCOUNTER — Other Ambulatory Visit: Payer: Federal, State, Local not specified - PPO

## 2017-09-04 ENCOUNTER — Ambulatory Visit: Payer: Federal, State, Local not specified - PPO

## 2017-09-04 ENCOUNTER — Ambulatory Visit (HOSPITAL_BASED_OUTPATIENT_CLINIC_OR_DEPARTMENT_OTHER): Payer: Federal, State, Local not specified - PPO | Admitting: Nurse Practitioner

## 2017-09-04 ENCOUNTER — Encounter: Payer: Self-pay | Admitting: Nurse Practitioner

## 2017-09-04 ENCOUNTER — Telehealth: Payer: Self-pay | Admitting: Nurse Practitioner

## 2017-09-04 ENCOUNTER — Ambulatory Visit (HOSPITAL_BASED_OUTPATIENT_CLINIC_OR_DEPARTMENT_OTHER): Payer: Federal, State, Local not specified - PPO

## 2017-09-04 VITALS — BP 132/81 | HR 87 | Temp 97.5°F | Resp 16 | Ht 66.0 in | Wt 214.0 lb

## 2017-09-04 DIAGNOSIS — I10 Essential (primary) hypertension: Secondary | ICD-10-CM | POA: Diagnosis not present

## 2017-09-04 DIAGNOSIS — Z95828 Presence of other vascular implants and grafts: Secondary | ICD-10-CM

## 2017-09-04 DIAGNOSIS — C50512 Malignant neoplasm of lower-outer quadrant of left female breast: Secondary | ICD-10-CM | POA: Diagnosis not present

## 2017-09-04 DIAGNOSIS — Z5111 Encounter for antineoplastic chemotherapy: Secondary | ICD-10-CM

## 2017-09-04 DIAGNOSIS — Z171 Estrogen receptor negative status [ER-]: Principal | ICD-10-CM

## 2017-09-04 DIAGNOSIS — E876 Hypokalemia: Secondary | ICD-10-CM

## 2017-09-04 LAB — COMPREHENSIVE METABOLIC PANEL
ALT: 37 U/L (ref 0–55)
AST: 27 U/L (ref 5–34)
Albumin: 3.6 g/dL (ref 3.5–5.0)
Alkaline Phosphatase: 56 U/L (ref 40–150)
Anion Gap: 8 mEq/L (ref 3–11)
BILIRUBIN TOTAL: 0.69 mg/dL (ref 0.20–1.20)
BUN: 6 mg/dL — AB (ref 7.0–26.0)
CHLORIDE: 105 meq/L (ref 98–109)
CO2: 27 meq/L (ref 22–29)
CREATININE: 0.7 mg/dL (ref 0.6–1.1)
Calcium: 9.3 mg/dL (ref 8.4–10.4)
EGFR: >60 mL/min/{1.73_m2} (ref >60)
GLUCOSE: 136 mg/dL (ref 70–140)
Potassium: 3.2 mEq/L — ABNORMAL LOW (ref 3.5–5.1)
SODIUM: 140 meq/L (ref 136–145)
TOTAL PROTEIN: 6.9 g/dL (ref 6.4–8.3)

## 2017-09-04 LAB — CBC WITH DIFFERENTIAL/PLATELET
BASO%: 0.9 % (ref 0.0–2.0)
BASOS ABS: 0 10*3/uL (ref 0.0–0.1)
EOS ABS: 0.1 10*3/uL (ref 0.0–0.5)
EOS%: 2.4 % (ref 0.0–7.0)
HCT: 31.8 % — ABNORMAL LOW (ref 34.8–46.6)
HEMOGLOBIN: 10.6 g/dL — AB (ref 11.6–15.9)
LYMPH%: 17.7 % (ref 14.0–49.7)
MCH: 29 pg (ref 25.1–34.0)
MCHC: 33.2 g/dL (ref 31.5–36.0)
MCV: 87.2 fL (ref 79.5–101.0)
MONO#: 0.3 10*3/uL (ref 0.1–0.9)
MONO%: 6.9 % (ref 0.0–14.0)
NEUT#: 3.6 10*3/uL (ref 1.5–6.5)
NEUT%: 72.1 % (ref 38.4–76.8)
Platelets: 394 10*3/uL (ref 145–400)
RBC: 3.64 10*6/uL — ABNORMAL LOW (ref 3.70–5.45)
RDW: 17.7 % — AB (ref 11.2–14.5)
WBC: 5 10*3/uL (ref 3.9–10.3)
lymph#: 0.9 10*3/uL (ref 0.9–3.3)

## 2017-09-04 MED ORDER — FAMOTIDINE IN NACL 20-0.9 MG/50ML-% IV SOLN
INTRAVENOUS | Status: AC
  Filled 2017-09-04: qty 50

## 2017-09-04 MED ORDER — DEXAMETHASONE SODIUM PHOSPHATE 10 MG/ML IJ SOLN
10.0000 mg | Freq: Once | INTRAMUSCULAR | Status: AC
  Administered 2017-09-04: 10 mg via INTRAVENOUS

## 2017-09-04 MED ORDER — DIPHENHYDRAMINE HCL 50 MG/ML IJ SOLN
INTRAMUSCULAR | Status: AC
  Filled 2017-09-04: qty 1

## 2017-09-04 MED ORDER — FAMOTIDINE IN NACL 20-0.9 MG/50ML-% IV SOLN
20.0000 mg | Freq: Once | INTRAVENOUS | Status: AC
Start: 2017-09-04 — End: 2017-09-04
  Administered 2017-09-04: 20 mg via INTRAVENOUS

## 2017-09-04 MED ORDER — SODIUM CHLORIDE 0.9% FLUSH
10.0000 mL | INTRAVENOUS | Status: DC | PRN
  Administered 2017-09-04: 10 mL via INTRAVENOUS
  Filled 2017-09-04: qty 10

## 2017-09-04 MED ORDER — DIPHENHYDRAMINE HCL 50 MG/ML IJ SOLN
25.0000 mg | Freq: Once | INTRAMUSCULAR | Status: AC
  Administered 2017-09-04: 25 mg via INTRAVENOUS

## 2017-09-04 MED ORDER — PACLITAXEL CHEMO INJECTION 300 MG/50ML
80.0000 mg/m2 | Freq: Once | INTRAVENOUS | Status: AC
  Administered 2017-09-04: 168 mg via INTRAVENOUS
  Filled 2017-09-04: qty 28

## 2017-09-04 MED ORDER — SODIUM CHLORIDE 0.9 % IV SOLN
Freq: Once | INTRAVENOUS | Status: AC
  Administered 2017-09-04: 15:00:00 via INTRAVENOUS

## 2017-09-04 MED ORDER — DEXAMETHASONE SODIUM PHOSPHATE 10 MG/ML IJ SOLN
INTRAMUSCULAR | Status: AC
  Filled 2017-09-04: qty 1

## 2017-09-04 MED ORDER — POTASSIUM CHLORIDE CRYS ER 20 MEQ PO TBCR: 20.0000 meq | EXTENDED_RELEASE_TABLET | Freq: Every day | ORAL | 1 refills | Status: DC

## 2017-09-04 NOTE — Telephone Encounter (Signed)
No 12/28 los.  

## 2017-09-04 NOTE — Patient Instructions (Signed)
[facility] Discharge Instructions for Patients Receiving Chemotherapy  Today you received the following chemotherapy agents:  Taxol.  To help prevent nausea and vomiting after your treatment, we encourage you to take your nausea medication as directed.   If you develop nausea and vomiting that is not controlled by your nausea medication, call the clinic.   BELOW ARE SYMPTOMS THAT SHOULD BE REPORTED IMMEDIATELY:  *FEVER GREATER THAN 100.5 F  *CHILLS WITH OR WITHOUT FEVER  NAUSEA AND VOMITING THAT IS NOT CONTROLLED WITH YOUR NAUSEA MEDICATION  *UNUSUAL SHORTNESS OF BREATH  *UNUSUAL BRUISING OR BLEEDING  TENDERNESS IN MOUTH AND THROAT WITH OR WITHOUT PRESENCE OF ULCERS  *URINARY PROBLEMS  *BOWEL PROBLEMS  UNUSUAL RASH Items with * indicate a potential emergency and should be followed up as soon as possible.  Feel free to call the clinic should you have any questions or concerns. The clinic phone number is [phone].  Please show the CHEMO ALERT CARD at check-in to the Emergency Department and triage nurse.   

## 2017-09-04 NOTE — Progress Notes (Signed)
  [facility] OFFICE PROGRESS NOTE   Diagnosis: Breast cancer  CURRENT THERAPY:neoadjuvant chemotherapyAC every 2 weeks for 4 cycles starting 05/22/17 and completed on 07/07/17, followed by weekly paclitaxel for 12 cycles starting 11/16/18to complete 10/09/16.   INTERVAL HISTORY:   Ms. Chavira returns as scheduled.  She completed cycle 6 weekly Taxol 08/28/2017.  She denies nausea/vomiting.  No mouth sores.  No diarrhea or constipation.  No numbness or tingling in her hands or feet.  She notes fingernails are discolored.  No associated discomfort.  She has a good appetite.  She has a good energy level.  She notes improved tolerance of the Taxol as compared to [facility].  Objective:  Vital signs in last 24 hours:  Blood pressure 132/81, pulse 87, temperature (!) 97.5 F (36.4 C), temperature source Oral, resp. rate 16, height _0  (1.676 m), weight 214 lb (97.1 kg), SpO2 100 %.    HEENT: No thrush or ulcers. Resp: Lungs clear bilaterally. Cardio: Regular rate and rhythm. GI: Abdomen soft and nontender.  No hepatomegaly. Vascular: No leg edema. Neuro: Vibratory sense mildly decreased over the fingertips for tuning fork exam. Skin: Multiple fingernails with discoloration. Breasts: No mass palpated in the left breast. Port-A-Cath without erythema.   Lab Results:  Lab Results  Component Value Date   WBC 5.0 09/04/2017   HGB 10.6 (L) 09/04/2017   HCT 31.8 (L) 09/04/2017   MCV 87.2 09/04/2017   PLT 394 09/04/2017   NEUTROABS 3.6 09/04/2017    Imaging:  No results found.  Medications: I have reviewed the patient's current medications.  Assessment/Plan: 1. 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  Status post AC x4 05/22/2017 through 07/07/2017; initiation of weekly Taxol 07/24/2017. 2. Genetics.  She has seen a Retail buyer. 3. Hypertension.  On  hydrochlorothiazide and lisinopril. 4. Rosacea.  Topical steroid. 5. History of skin wound at the Port-A-Cath site and folliculitis upper chest.  Resolved.     Disposition: Ms. Cartwright appears stable.  She has completed 6 cycles of weekly Taxol.  She is tolerating the chemotherapy well.  Plan to proceed with cycle 7 today as scheduled.  She will return for a follow-up visit 09/11/2017.  She will contact the office in the interim with any problems.  We reviewed today's labs.  She has hypokalemia.  She is currently taking Kdur 20 mEq daily.  She will increase to 20 mEq twice daily for 3 days and then resume once daily.    Ned Card ANP/GNP-BC   09/04/2017  2:07 PM

## 2017-09-04 NOTE — Patient Instructions (Signed)
Implanted Port Home Guide An implanted port is a type of central line that is placed under the skin. Central lines are used to provide IV access when treatment or nutrition needs to be given through a person's veins. Implanted ports are used for long-term IV access. An implanted port may be placed because:  You need IV medicine that would be irritating to the small veins in your hands or arms.  You need long-term IV medicines, such as antibiotics.  You need IV nutrition for a long period.  You need frequent blood draws for lab tests.  You need dialysis.  Implanted ports are usually placed in the chest area, but they can also be placed in the upper arm, the abdomen, or the leg. An implanted port has two main parts:  Reservoir. The reservoir is round and will appear as a small, raised area under your skin. The reservoir is the part where a needle is inserted to give medicines or draw blood.  Catheter. The catheter is a thin, flexible tube that extends from the reservoir. The catheter is placed into a large vein. Medicine that is inserted into the reservoir goes into the catheter and then into the vein.  How will I care for my incision site? Do not get the incision site wet. Bathe or shower as directed by your health care provider. How is my port accessed? Special steps must be taken to access the port:  Before the port is accessed, a numbing cream can be placed on the skin. This helps numb the skin over the port site.  Your health care provider uses a sterile technique to access the port. ? Your health care provider must put on a mask and sterile gloves. ? The skin over your port is cleaned carefully with an antiseptic and allowed to dry. ? The port is gently pinched between sterile gloves, and a needle is inserted into the port.  Only "non-coring" port needles should be used to access the port. Once the port is accessed, a blood return should be checked. This helps ensure that the port  is in the vein and is not clogged.  If your port needs to remain accessed for a constant infusion, a clear (transparent) bandage will be placed over the needle site. The bandage and needle will need to be changed every week, or as directed by your health care provider.  Keep the bandage covering the needle clean and dry. Do not get it wet. Follow your health care provider's instructions on how to take a shower or bath while the port is accessed.  If your port does not need to stay accessed, no bandage is needed over the port.  What is flushing? Flushing helps keep the port from getting clogged. Follow your health care provider's instructions on how and when to flush the port. Ports are usually flushed with saline solution or a medicine called heparin. The need for flushing will depend on how the port is used.  If the port is used for intermittent medicines or blood draws, the port will need to be flushed: ? After medicines have been given. ? After blood has been drawn. ? As part of routine maintenance.  If a constant infusion is running, the port may not need to be flushed.  How long will my port stay implanted? The port can stay in for as long as your health care provider thinks it is needed. When it is time for the port to come out, surgery will be   done to remove it. The procedure is similar to the one performed when the port was put in. When should I seek immediate medical care? When you have an implanted port, you should seek immediate medical care if:  You notice a bad smell coming from the incision site.  You have swelling, redness, or drainage at the incision site.  You have more swelling or pain at the port site or the surrounding area.  You have a fever that is not controlled with medicine.  This information is not intended to replace advice given to you by your health care provider. Make sure you discuss any questions you have with your health care provider. Document  Released: 08/25/2005 Document Revised: 01/31/2016 Document Reviewed: 05/02/2013 Elsevier Interactive Patient Education  2017 [employer]  

## 2017-09-04 NOTE — Progress Notes (Signed)
Per Ned Card NP ok to tx with today's labs. K of 3.2 addressed with Lattie Haw NP.

## 2017-09-10 NOTE — Progress Notes (Signed)
[place]  [phone] [phone]  Clinic follow up Note   Patient Care Team: [name], MD as PCP - General (Family Medicine) [name], MD as Consulting Physician (Hematology) [name], MD as Consulting Physician (General Surgery) [name], MD as Consulting Physician (Radiation Oncology)   Date of Service:  09/11/2017   CHIEF COMPLAINTS:  Malignant neoplasm of lower-outer quadrant of left breast of female, triple negative   Oncology History   Cancer Staging Malignant neoplasm of lower-outer quadrant of left breast of female, estrogen receptor negative ([place]) Staging form: Breast, AJCC 8th Edition - Clinical: Stage IIB (cT2, cN0(f), cM0, G3, ER: Negative, PR: Negative, HER2: Negative) - Unsigned       Malignant neoplasm of lower-outer quadrant of left breast of female, estrogen receptor negative ([place])   04/27/2017 Mammogram    IMPRESSION: 1. Highly suspicious mass within the LEFT breast at the 5:30 o'clock axis, 4 cm from the nipple, measuring 2.5 cm, corresponding to the mammographic finding and corresponding to 1 of the palpable lumps in the left breast. Ultrasound-guided biopsy is recommended. 2. Mildly prominent lymph node in the LEFT axilla, with normal cortical thickness but with questionable effacement of the fatty hilum. Ultrasound-guided core biopsy is recommended. 3. No evidence of malignancy within the RIGHT breast. Also, no evidence of malignancy within the second area of clinical concern in the upper outer left breast.      04/29/2017 Initial Diagnosis    Malignant neoplasm of lower-outer quadrant of left breast of female, estrogen receptor negative ([place])      04/29/2017 Receptors her2    Estrogen Receptor: 0%, NEGATIVE Progesterone Receptor: 0%, NEGATIVE Proliferation Marker Ki67: 90% HER2 NEGATIVE       04/29/2017 Initial Biopsy     Diagnosis 1. Breast, left, needle core biopsy, 5:30 o'clock, mass -  INVASIVE DUCTAL CARCINOMA, G2-3 - DUCTAL CARCINOMA IN SITU. - SEE COMMENT. 2. Lymph node, needle/core biopsy, left axilla - THERE IN NO EVIDENCE OF CARCINOMA IN 1 OF 1 LYMPH NODE (0/1).      05/14/2017 Imaging    MRI of Breast Bilateral 05/14/17  IMPRESSION: 1. 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 2. Non mass enhancement extending approximately 3.2 cm anterior to the mass in the lower inner quadrant of the left breast, suspicious for DCIS. There is no correlate on recent mammography. The overall extent of the mass and non mass enhancement is approximately 5 cm. 3. No MRI evidence of malignancy involving the right breast. 4. No pathologic lymphadenopathy. Upper normal sized left axillary lymph nodes, the largest of which was previously biopsied no evidence of metastatic disease.      05/15/2017 Imaging    Bone scan whole body 05/15/17 IMPRESSION: No findings specific for metastatic disease to bone.      05/15/2017 Imaging    CT CAP W contrast 05/15/17  IMPRESSION: 1. 18 mm soft tissue lesion inferior left breast compatible with known primary malignancy. 2. Mildly enlarged left axillary lymph nodes in this patient with biopsy-proven metastatic disease to the left axilla. 3. No evidence for lymphadenopathy elsewhere in the chest. No lymphadenopathy in the abdomen or pelvis. No evidence for metastatic disease in the abdomen or pelvis. 4. Tiny right perifissural lung nodule most likely subpleural lymph node. Attention on follow-up recommended. 5. 7 mm hypoattenuating lesion in the spleen, likely a cyst or pseudocyst. Attention on follow-up recommended. 6.  Uterine fibroids. 7.  Aortic Atherosclerois (ICD10-170.0)      05/19/2017 Procedure    Port placement by Dr. [name] on 9/11/8      05/22/2017 -  Chemotherapy     neoadjuvant chemotherpy AC every 2 weeks for 4 cycles  starting 05/22/17 and completed on 07/07/17, followed by weekly paclitaxel for 12 cycles starting 07/24/17 to complete on 10/09/17       05/22/2017 Pathology Results    Diagnosis Breast, left, needle core biopsy, lower inner quad - DUCTAL CARCINOMA IN SITU. 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  The genes analyzed were the 46 genes on [facility]'s Common Cancers panel (APC, ATM, AXIN2, BARD1, BMPR1A, BRCA1, BRCA2, BRIP1, CDH1, CDKN2A, CHEK2, CTNNA1, DICER1, EPCAM, GREM1, HOXB13, KIT, MEN1, MLH1, MSH2, MSH3, MSH6, MUTYH, NBN, NF1, NTHL1, PALB2, PDGFRA, PMS2, POLD1, POLE, PTEN, RAD50, RAD51C, RAD51D, SDHA, SDHB, SDHC, SDHD, SMAD4, SMARCA4, STK11, TP53, TSC1, TSC2, VHL).        08/14/2017 Echocardiogram    EF 60-65%  Study Conclusions  - Left ventricle: The cavity size was normal. Systolic function was   normal. The estimated ejection fraction was in the range of 60%   to 65%. Wall motion was normal; there were no regional wall   motion abnormalities. Doppler parameters are consistent with   abnormal left ventricular relaxation (grade 1 diastolic   dysfunction). - Pulmonary arteries: PA peak pressure: 31 mm Hg (S).  Impressions:  - No change from September 2018 study (strain rate not performed on   that study).         HISTORY OF PRESENTING ILLNESS: 8/29/8  Briana Price 52 y.o. 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  She presents to the clinic today with her mother and father.  She first noticed her a mass in her left breast on the 17th of August, It was a hard knot, no pain or tenderness. Her mammogram form 03/24/17 and other previous mammograms were all normal.   In the past she was diagnosed with HTN and takes hydrochlorothiazide and lisinopril. She had her gallbladder removed. Her maternal grandmother,  maternal aunt and paternal aunt had breast cancer. 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 The pain can last up to 3 days.   Today she denies skin change around breast, pain, fatigue or weight loss. She still has regular periods that last 3 days. She work in [employer] for the [employer].   GYN HISTORY  Menarchal: 14 LMP: 05/01/17 Contraceptive: No HRT: n/a GP: G1P1 at age 60, she has a son  CURRENT THERAPY: neoadjuvant chemotherapy AC every 2 weeks for 4 cycles starting 05/22/17 and completed on 07/07/17, followed by weekly paclitaxel for 12 cycles starting 07/24/17 to complete 10/09/17.   INTERVAL HISTORY:  Briana Price is here for a follow up and cycle 8 of Taxol. She presents to the clinic today with her family.  She notes she is doing well. She denies any concerns at this time. She has not seen surgeon to determine if she will do lumpectomy or mastectomy with reconstruction. She is still taking her oral potassium. She is still on HCTZ.   On review of symptoms, she notes she is eating the same, she denies pain or abnormal bowel habits.      MEDICAL HISTORY:  Past Medical History:  Diagnosis  Date  . Anemia   . Breast cancer ([place])   . [facility])    left breast cancer  . Family history of breast cancer   . Genetic testing 05/28/2017   Common Cancers panel (46 genes) @ [facility] - No pathogenic mutations detected  . GERD (gastroesophageal reflux disease)   . Headache    Migraines  . Hypertension    SURGICAL HISTORY: Past Surgical History:  Procedure Laterality Date  . BREAST SURGERY     biopsy  . CESAREAN SECTION    . CHOLECYSTECTOMY N/A 02/07/2013   Procedure: LAPAROSCOPIC CHOLECYSTECTOMY WITH INTRAOPERATIVE CHOLANGIOGRAM;  Surgeon: Gwenyth Ober, MD;  Location: [facility];  Service: General;  Laterality: N/A;  . COLONOSCOPY W/ BIOPSIES AND POLYPECTOMY    . FOOT SURGERY  2003   lt foot bunionectomy  . MULTIPLE TOOTH EXTRACTIONS    . PORTACATH PLACEMENT  Right 05/19/2017   Procedure: INSERTION PORT-A-CATH;  Surgeon: [name], MD;  Location: [facility];  Service: General;  Laterality: Right;   SOCIAL HISTORY: Social History   Socioeconomic History  . Marital status: Single    Spouse name: Not on file  . Number of children: Not on file  . Years of education: Not on file  . Highest education level: Not on file  Social Needs  . Financial resource strain: Not on file  . Food insecurity - worry: Not on file  . Food insecurity - inability: Not on file  . Transportation needs - medical: Not on file  . Transportation needs - non-medical: Not on file  Occupational History  . Not on file  Tobacco Use  . Smoking status: Former Smoker    Types: Cigarettes  . Smokeless tobacco: Never Used  . Tobacco comment: 15-20 years ago  Substance and Sexual Activity  . Alcohol use: No  . Drug use: No  . Sexual activity: Not on file  Other Topics Concern  . Not on file  Social History Narrative   Tobacco use   Cigarettes: Never smoked    Tobacco history last updated 01/23/2014   No smoking   No tobacco exposure   No alcohol   Caffeine : yes soda   No recreational drug use   Occupation :employed ,[employer] 01/25/2013   Martial status : single    Children: Louie Casa 01/25/2013   45 year old son   FAMILY HISTORY: Family History  Problem Relation Age of Onset  . Breast cancer Maternal Grandmother        dx 68s; deceased 88  . Breast cancer Paternal Aunt 81       recurrence vs. 2nd primary at 46; currently 51  . Breast cancer Maternal Aunt 62       deceased 63  . Hypertension Mother   . Breast cancer Other        pat grandfather's sister; dx 36s  . Leukemia Paternal Uncle        deceased 33  . Breast cancer Other        paternal distant cousin; dx 49s   ALLERGIES:  has No Known Allergies.  MEDICATIONS:  Current Outpatient Medications  Medication Sig Dispense Refill  . aspirin-acetaminophen-caffeine (EXCEDRIN MIGRAINE) 250-250-65  MG tablet Take 2 tablets by mouth 2 (two) times daily as needed for headache.    . Cholecalciferol (VITAMIN D) 2000 units CAPS Take 2,000 Units by mouth daily.    . ciprofloxacin (CILOXAN) 0.3 % ophthalmic solution     .  fluocinonide ointment (LIDEX) 2.40 % Apply 1 application topically daily as needed (rosacea).    . hydrochlorothiazide (HYDRODIURIL) 25 MG tablet Take 25 mg by mouth daily.    Marland Kitchen lisinopril (PRINIVIL,ZESTRIL) 10 MG tablet Take 10 mgs by mouth daily  2  . ondansetron (ZOFRAN) 8 MG tablet Take 8 mg by mouth every 8 (eight) hours as needed for nausea or vomiting.    . potassium chloride SA (K-DUR,KLOR-CON) 20 MEQ tablet Take 1 tablet (20 mEq total) by mouth daily. 30 tablet 1  . oxyCODONE (OXY IR/ROXICODONE) 5 MG immediate release tablet Take 1-2 tablets (5-10 mg total) by mouth every 6 (six) hours as needed for moderate pain, severe pain or breakthrough pain. (Patient not taking: Reported on 09/04/2017) 20 tablet 0   No current facility-administered medications for this visit.    Facility-Administered Medications Ordered in Other Visits  Medication Dose Route Frequency Provider Last Rate Last Dose  . sodium chloride flush (NS) 0.9 % injection 10 mL  10 mL Intravenous PRN [name], MD   10 mL at 08/07/17 1354   REVIEW OF SYSTEMS:   Constitutional: Denies fevers, chills or abnormal night sweats  Eyes: Denies blurriness of vision, double vision or watery eyes Ears, nose, mouth, throat, and face: Denies mucositis or sore throat Respiratory: Denies cough, dyspnea or wheezes Cardiovascular: Denies palpitation, chest discomfort or lower extremity swelling (+) mild heart murmur Gastrointestinal:  Denies nausea, heartburn or change in bowel habits Skin: Denies abnormal skin rashes Lymphatics: Denies new lymphadenopathy or easy bruising Neurological:Denies numbness, tingling or new weaknesses MSK: (+) joint pain in hand  Behavioral/Psych: Mood is stable, no new changes. Breast:  negative All other systems were reviewed with the patient and are negative.  PHYSICAL EXAMINATION: ECOG PERFORMANCE STATUS: 1 BP 132/86 (BP Location: Left Arm, Patient Position: Sitting)   Pulse 89   Temp 97.8 F (36.6 C) (Oral)   Resp 20   Ht _0  (1.676 m)   Wt 214 lb 11.2 oz (97.4 kg)   SpO2 100%   BMI 34.65 kg/m   Vitals:   09/11/17 0924  BP: 132/86  Pulse: 89  Resp: 20  Temp: 97.8 F (36.6 C)  TempSrc: Oral  SpO2: 100%  Weight: 214 lb 11.2 oz (97.4 kg)  Height: _1  (1.676 m)     GENERAL:alert, no distress and comfortable SKIN: skin color, texture, turgor are normal, no rashes or significant lesions EYES: normal, conjunctiva are pink and non-injected, sclera clear OROPHARYNX:no exudate, no erythema and lips, buccal mucosa, and tongue normal  NECK: supple, thyroid normal size, non-tender, without nodularity LYMPH:  no palpable lymphadenopathy in the cervical, axillary or inguinal LUNGS: clear to auscultation and percussion with normal breathing effort HEART: regular rate and no lower extremity edema (+) mild Heart murmur, related to anemia  ABDOMEN:abdomen soft, non-tender and normal bowel sounds Musculoskeletal:no cyanosis of digits and no clubbing  PSYCH: alert & oriented x 3 with fluent speech NEURO: no focal motor/sensory deficits Breast: (+) no palpable mass in left axilla (+) Inner lower quadrant mass in 7:00 position of left breast is no longer palpable (+) no palpable mass in right breast or right axilla, normal   LABORATORY DATA:  I have reviewed the data as listed CBC Latest Ref Rng & Units 09/11/2017 09/04/2017 08/28/2017  WBC 3.9 - 10.3 10e3/uL 5.9 5.0 5.8  Hemoglobin 11.6 - 15.9 g/dL 11.2(L) 10.6(L) 10.4(L)  Hematocrit 34.8 - 46.6 % 33.7(L) 31.8(L) 32.0(L)  Platelets 145 - 400 10e3/uL  406(H) 394 295   CMP Latest Ref Rng & Units 09/11/2017 09/04/2017 08/28/2017  Glucose 70 - 140 mg/dl 116 136 117  BUN 7.0 - 26.0 mg/dL 9.1 6.0(L) 8.0  Creatinine 0.6 -  1.1 mg/dL 0.7 0.7 0.7  Sodium 136 - 145 mEq/L 138 140 137  Potassium 3.5 - 5.1 mEq/L 3.7 3.2(L) 3.5  Chloride 96 - 112 mEq/L - - -  CO2 22 - 29 mEq/L _0 Calcium 8.4 - 10.4 mg/dL 9.1 9.3 8.6  Total Protein 6.4 - 8.3 g/dL 7.0 6.9 6.7  Total Bilirubin 0.20 - 1.20 mg/dL 0.61 0.69 0.55  Alkaline Phos 40 - 150 U/L 64 56 58  AST 5 - 34 U/L _1 ALT 0 - 55 U/L 29 37 24   PATHOLOGY  Diagnosis 04/29/17 1. Breast, left, needle core biopsy, 5:30 o'clock, mass - INVASIVE DUCTAL CARCINOMA. - DUCTAL CARCINOMA IN SITU. - SEE COMMENT. 2. Lymph node, needle/core biopsy, left axilla - THERE IN NO EVIDENCE OF CARCINOMA IN 1 OF 1 LYMPH NODE (0/1). Microscopic Comment 1. The carcinoma appears grade 2-3. A breast prognostic profile will be performed and the results reported separately. The results were called to [facility] on 04/30/17. (JBK:gt, 04/30/17) 1. PROGNOSTIC INDICATORS Results: IMMUNOHISTOCHEMICAL AND MORPHOMETRIC ANALYSIS PERFORMED MANUALLY Estrogen Receptor: 0%, NEGATIVE Progesterone Receptor: 0%, NEGATIVE Proliferation Marker Ki67: 90% COMMENT: The negative hormone receptor study(ies) in this case has an internal positive control. 1. FLUORESCENCE IN-SITU HYBRIDIZATION Results: HER2 - NEGATIVE RATIO OF HER2/CEP17 SIGNALS 1.11 AVERAGE HER2 COPY NUMBER PER CELL 2.1    PROCEDURES   ECHO 05/14/17 Study Conclusions - Left ventricle: The cavity size was normal. Systolic function was normal. The estimated ejection fraction was in the range of 55% to 60%. Wall motion was normal; there were no regional wall motion abnormalities. Left ventricular diastolic function parameters were normal.  RADIOGRAPHIC STUDIES: I have personally reviewed the radiological images as listed and agreed with the findings in the report.  Bone scan whole body 05/15/17 IMPRESSION: No findings specific for metastatic disease to bone.  CT CAP W contrast 05/15/17  IMPRESSION: 1. 18  mm soft tissue lesion inferior left breast compatible with known primary malignancy. 2. Mildly enlarged left axillary lymph nodes in this patient with biopsy-proven metastatic disease to the left axilla. 3. No evidence for lymphadenopathy elsewhere in the chest. No lymphadenopathy in the abdomen or pelvis. No evidence for metastatic disease in the abdomen or pelvis. 4. Tiny right perifissural lung nodule most likely subpleural lymph node. Attention on follow-up recommended. 5. 7 mm hypoattenuating lesion in the spleen, likely a cyst or pseudocyst. Attention on follow-up recommended. 6. Uterine fibroids. 7.  Aortic Atherosclerois (ICD10-170.0)  No results found. ASSESSMENT & PLAN:  TAKERIA MARQUINA is a 53 y.o. african-american female with a history of HTN and arthritis in her back, presented with a palpable left breast mass.  1. 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  -We discussed her mammogram, ultrasound findings, and initial biopsy results with patient and her family members in details.  -She presented with a palpable left breast mass, measures 2.5 cm on ultrasound, with a prominent lymph node. 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 -She is a candidate for lumpectomy and sentinel lymph node biopsy. She was seen by surgeon Dr. [name]. Due to her  young age and triple negative disease, we recommended her to see genetics, to ruled out inheritable genetic syndrome. -I reviewed her breast MRI findings, which showed additional non-mass enhancement in the left breast, which is anterior to the biopsy proven breast cancer, in the lower inner quadrant. I recommend her to have additional biopsy, she agrees, we'll schedule for this week. -I reviewed her CT scan and bone scan findings in great detail, no evidence of  distant metastasis. -both MRI and a CT scan showed enlarged left axillary lymph node, but biopsy was negative for malignancy, although I still have some concern that she may have metastatic adenopathy. -She agreed to proceed with neoadjuvant chemotherapy. She has concerns about the secondary malignancy from Adriamycin. However given the extensive of her disease, I strongly encouraged her to take Adriamycin and Cytoxan, followed by Taxol.  --She underwent AC with Onpro on 05/22/17-07/07/17. She has tolerated well overall. After second cycle she developed Leukocytosis secondary to Onpro. Onpro was skipped with her Cycle 3 treatment, cycle 4 was pushed back 1 week due to neutropenia.  -She has had good clinical response to chemotherapy. Her left breast mass is not palpable anymore.  -She started weekly taxol on 07/24/17 and has been tolerating well so far. Plan to complete on 10/09/17 -I discussed her mild heart murmur is likely related to her anemia. Her 05/2017 ECHO shows no structural issues with her valves.  -Will do breast MRI in 5 weeks to evaluate her response to neoadjuvant chemotherapy -Send message to Dr. [name] about future surgery  -Labs reviewed, Hg improved to 11.2. Labs adequate to continue weekly Taxol.   -F/u in 2 weeks with lacie, f/u with me on 2/1  2. Genetics -Based on her significant familial breast cancer history, her young age and triple negative disease,  I suggest she gets genetic testing for possible gene mutations.  -Apt with Roma Kayser in genetics on 05/28/17 -Positive for Variant of Uncertain Significance: ATM, but negative for other 46 genes on [facility]'s Common Cancers panel (APC, ATM, AXIN2, BARD1, BMPR1A, BRCA1, BRCA2, BRIP1, CDH1, CDKN2A, CHEK2, CTNNA1, DICER1, EPCAM, GREM1, HOXB13, KIT, MEN1, MLH1, MSH2, MSH3, MSH6, MUTYH, NBN, NF1, NTHL1, PALB2, PDGFRA, PMS2, POLD1, POLE, PTEN, RAD50, RAD51C, RAD51D, SDHA, SDHB, SDHC, SDHD, SMAD4, SMARCA4, STK11, TP53, TSC1, TSC2,  VHL).  3. HTN -she is on HCTZ and lisinopril , we'll continue, she will follow-up with her primary care physician  -We discussed the impact of chemotherapy, blood pressure, and a possibility that we may hold her blood pressure medication if she gets dehydrated.   4. Rosacea -Dermatologist prescribed topical steroid -OK to use during chemotherapy; she will also receive IV steroid which may help control  5. Skin wound at [facility] site and folliculitis in front upper chest, resolved -drained and cultured on 06/05/17  -patient afebrile without signs of systemic infection, but is immune compromised while undergoing chemotherapy -Given bactrim DS BID x10 days for MRSA coverage prescribed 06/05/17 -Resolved now  6. Hypokalemia -She is on Kdur 20 mEQ daily    PLAN:  -Labs reviewed and adequate to proceed with taxol today and continue weekly until 2/1  -Send message to Dr. [name] about future surgery, she will see Dr. [name] in Feb after breast MRI  -Breast MRI in 5 weeks  -Lab and f/u with lacie in 2 weeks  -Lab and f/u with me on 2/1   Orders Placed This Encounter  Procedures  . MR BREAST BILATERAL W WO CONTRAST INC CAD  Standing Status:   Future    Standing Expiration Date:   11/10/2018    Order Specific Question:   If indicated for the ordered procedure, I authorize the administration of contrast media per Radiology protocol    Answer:   Yes    Order Specific Question:   What is the patient's sedation requirement?    Answer:   No Sedation    Order Specific Question:   Does the patient have a pacemaker or implanted devices?    Answer:   No    Order Specific Question:   Radiology Contrast Protocol - do NOT remove file path    Answer:   [email or web address]    Order Specific Question:   Preferred imaging location?    Answer:   [facility] (table limit-550lbs)   All questions were answered. The patient knows to call the clinic with any problems,  questions or concerns. I spent 20 minutes counseling the patient face to face. The total time spent in the appointment was 25 minutes and more than 50% was on counseling.  This document serves as a record of services personally performed by [name], MD. It was created on her behalf by Joslyn Devon, a trained medical scribe. The creation of this record is based on the scribe's personal observations and the provider's statements to them.    I have reviewed the above documentation for accuracy and completeness, and I agree with the above.     [name], MD 09/11/2017

## 2017-09-11 ENCOUNTER — Ambulatory Visit: Payer: Federal, State, Local not specified - PPO | Admitting: Hematology

## 2017-09-11 ENCOUNTER — Ambulatory Visit: Payer: Federal, State, Local not specified - PPO

## 2017-09-11 ENCOUNTER — Encounter: Payer: Self-pay | Admitting: Hematology

## 2017-09-11 ENCOUNTER — Ambulatory Visit (HOSPITAL_BASED_OUTPATIENT_CLINIC_OR_DEPARTMENT_OTHER): Payer: Federal, State, Local not specified - PPO

## 2017-09-11 ENCOUNTER — Other Ambulatory Visit: Payer: Federal, State, Local not specified - PPO

## 2017-09-11 ENCOUNTER — Inpatient Hospital Stay: Payer: Federal, State, Local not specified - PPO | Attending: Hematology | Admitting: Hematology

## 2017-09-11 ENCOUNTER — Other Ambulatory Visit (HOSPITAL_BASED_OUTPATIENT_CLINIC_OR_DEPARTMENT_OTHER): Payer: Federal, State, Local not specified - PPO

## 2017-09-11 VITALS — BP 132/86 | HR 89 | Temp 97.8°F | Resp 20 | Ht 66.0 in | Wt 214.7 lb

## 2017-09-11 DIAGNOSIS — E876 Hypokalemia: Secondary | ICD-10-CM | POA: Diagnosis not present

## 2017-09-11 DIAGNOSIS — Z95828 Presence of other vascular implants and grafts: Secondary | ICD-10-CM

## 2017-09-11 DIAGNOSIS — K219 Gastro-esophageal reflux disease without esophagitis: Secondary | ICD-10-CM | POA: Insufficient documentation

## 2017-09-11 DIAGNOSIS — M479 Spondylosis, unspecified: Secondary | ICD-10-CM | POA: Insufficient documentation

## 2017-09-11 DIAGNOSIS — L719 Rosacea, unspecified: Secondary | ICD-10-CM | POA: Diagnosis not present

## 2017-09-11 DIAGNOSIS — T451X5S Adverse effect of antineoplastic and immunosuppressive drugs, sequela: Secondary | ICD-10-CM | POA: Insufficient documentation

## 2017-09-11 DIAGNOSIS — Z171 Estrogen receptor negative status [ER-]: Principal | ICD-10-CM

## 2017-09-11 DIAGNOSIS — G620 Drug-induced polyneuropathy: Secondary | ICD-10-CM | POA: Insufficient documentation

## 2017-09-11 DIAGNOSIS — Z5111 Encounter for antineoplastic chemotherapy: Secondary | ICD-10-CM | POA: Insufficient documentation

## 2017-09-11 DIAGNOSIS — C50512 Malignant neoplasm of lower-outer quadrant of left female breast: Secondary | ICD-10-CM

## 2017-09-11 DIAGNOSIS — Z7982 Long term (current) use of aspirin: Secondary | ICD-10-CM | POA: Insufficient documentation

## 2017-09-11 DIAGNOSIS — I10 Essential (primary) hypertension: Secondary | ICD-10-CM

## 2017-09-11 DIAGNOSIS — Z79899 Other long term (current) drug therapy: Secondary | ICD-10-CM | POA: Insufficient documentation

## 2017-09-11 LAB — CBC WITH DIFFERENTIAL/PLATELET
BASO%: 0.6 % (ref 0.0–2.0)
BASOS ABS: 0 10*3/uL (ref 0.0–0.1)
EOS ABS: 0.1 10*3/uL (ref 0.0–0.5)
EOS%: 1.6 % (ref 0.0–7.0)
HCT: 33.7 % — ABNORMAL LOW (ref 34.8–46.6)
HGB: 11.2 g/dL — ABNORMAL LOW (ref 11.6–15.9)
LYMPH%: 14.3 % (ref 14.0–49.7)
MCH: 28.9 pg (ref 25.1–34.0)
MCHC: 33.2 g/dL (ref 31.5–36.0)
MCV: 87.2 fL (ref 79.5–101.0)
MONO#: 0.5 10*3/uL (ref 0.1–0.9)
MONO%: 8.7 % (ref 0.0–14.0)
NEUT%: 74.8 % (ref 38.4–76.8)
NEUTROS ABS: 4.4 10*3/uL (ref 1.5–6.5)
Platelets: 406 10*3/uL — ABNORMAL HIGH (ref 145–400)
RBC: 3.86 10*6/uL (ref 3.70–5.45)
RDW: 16.9 % — ABNORMAL HIGH (ref 11.2–14.5)
WBC: 5.9 10*3/uL (ref 3.9–10.3)
lymph#: 0.9 10*3/uL (ref 0.9–3.3)

## 2017-09-11 LAB — COMPREHENSIVE METABOLIC PANEL
ALT: 29 U/L (ref 0–55)
AST: 21 U/L (ref 5–34)
Albumin: 3.7 g/dL (ref 3.5–5.0)
Alkaline Phosphatase: 64 U/L (ref 40–150)
Anion Gap: 7 mEq/L (ref 3–11)
BUN: 9.1 mg/dL (ref 7.0–26.0)
CALCIUM: 9.1 mg/dL (ref 8.4–10.4)
CHLORIDE: 103 meq/L (ref 98–109)
CO2: 28 mEq/L (ref 22–29)
CREATININE: 0.7 mg/dL (ref 0.6–1.1)
EGFR: >60 mL/min/{1.73_m2} (ref >60)
Glucose: 116 mg/dl (ref 70–140)
Potassium: 3.7 mEq/L (ref 3.5–5.1)
Sodium: 138 mEq/L (ref 136–145)
Total Bilirubin: 0.61 mg/dL (ref 0.20–1.20)
Total Protein: 7 g/dL (ref 6.4–8.3)

## 2017-09-11 MED ORDER — FAMOTIDINE IN NACL 20-0.9 MG/50ML-% IV SOLN
INTRAVENOUS | Status: AC
  Filled 2017-09-11: qty 50

## 2017-09-11 MED ORDER — SODIUM CHLORIDE 0.9 % IV SOLN
80.0000 mg/m2 | Freq: Once | INTRAVENOUS | Status: AC
  Administered 2017-09-11: 168 mg via INTRAVENOUS
  Filled 2017-09-11: qty 28

## 2017-09-11 MED ORDER — DIPHENHYDRAMINE HCL 50 MG/ML IJ SOLN
INTRAMUSCULAR | Status: AC
  Filled 2017-09-11: qty 1

## 2017-09-11 MED ORDER — FAMOTIDINE IN NACL 20-0.9 MG/50ML-% IV SOLN
20.0000 mg | Freq: Once | INTRAVENOUS | Status: AC
  Administered 2017-09-11: 20 mg via INTRAVENOUS

## 2017-09-11 MED ORDER — DEXAMETHASONE SODIUM PHOSPHATE 10 MG/ML IJ SOLN
10.0000 mg | Freq: Once | INTRAMUSCULAR | Status: AC
  Administered 2017-09-11: 10 mg via INTRAVENOUS

## 2017-09-11 MED ORDER — SODIUM CHLORIDE 0.9 % IV SOLN
Freq: Once | INTRAVENOUS | Status: AC
Start: 2017-09-11 — End: 2017-09-11
  Administered 2017-09-11: 10:00:00 via INTRAVENOUS

## 2017-09-11 MED ORDER — DEXAMETHASONE SODIUM PHOSPHATE 10 MG/ML IJ SOLN
INTRAMUSCULAR | Status: AC
  Filled 2017-09-11: qty 1

## 2017-09-11 MED ORDER — DIPHENHYDRAMINE HCL 50 MG/ML IJ SOLN
25.0000 mg | Freq: Once | INTRAMUSCULAR | Status: AC
  Administered 2017-09-11: 25 mg via INTRAVENOUS

## 2017-09-11 MED ORDER — HEPARIN SOD (PORK) LOCK FLUSH 100 UNIT/ML IV SOLN
500.0000 [IU] | Freq: Once | INTRAVENOUS | Status: AC | PRN
  Administered 2017-09-11: 500 [IU]
  Filled 2017-09-11: qty 5

## 2017-09-11 MED ORDER — SODIUM CHLORIDE 0.9% FLUSH
10.0000 mL | INTRAVENOUS | Status: DC | PRN
  Administered 2017-09-11: 10 mL
  Filled 2017-09-11: qty 10

## 2017-09-11 MED ORDER — SODIUM CHLORIDE 0.9% FLUSH
10.0000 mL | INTRAVENOUS | Status: DC | PRN
  Administered 2017-09-11: 10 mL via INTRAVENOUS
  Filled 2017-09-11: qty 10

## 2017-09-11 NOTE — Patient Instructions (Signed)
[facility] Discharge Instructions for Patients Receiving Chemotherapy  Today you received the following chemotherapy agents:  Taxol (paclitaxel)  To help prevent nausea and vomiting after your treatment, we encourage you to take your nausea medication as prescribed.   If you develop nausea and vomiting that is not controlled by your nausea medication, call the clinic.   BELOW ARE SYMPTOMS THAT SHOULD BE REPORTED IMMEDIATELY:  *FEVER GREATER THAN 100.5 F  *CHILLS WITH OR WITHOUT FEVER  NAUSEA AND VOMITING THAT IS NOT CONTROLLED WITH YOUR NAUSEA MEDICATION  *UNUSUAL SHORTNESS OF BREATH  *UNUSUAL BRUISING OR BLEEDING  TENDERNESS IN MOUTH AND THROAT WITH OR WITHOUT PRESENCE OF ULCERS  *URINARY PROBLEMS  *BOWEL PROBLEMS  UNUSUAL RASH Items with * indicate a potential emergency and should be followed up as soon as possible.  Feel free to call the clinic should you have any questions or concerns. The clinic phone number is [phone].  Please show the CHEMO ALERT CARD at check-in to the Emergency Department and triage nurse.   

## 2017-09-14 ENCOUNTER — Telehealth: Payer: Self-pay | Admitting: Hematology

## 2017-09-14 NOTE — Telephone Encounter (Signed)
Added f/u to 2/1 appointments per 1/4 los. Also s/w patient re calling [facility] to schedule breast mri prior to seeing YF 2/1. Other appointments remain the same.

## 2017-09-18 ENCOUNTER — Inpatient Hospital Stay: Payer: Federal, State, Local not specified - PPO

## 2017-09-18 VITALS — BP 138/89 | HR 87 | Temp 98.1°F | Resp 19

## 2017-09-18 DIAGNOSIS — L719 Rosacea, unspecified: Secondary | ICD-10-CM | POA: Diagnosis not present

## 2017-09-18 DIAGNOSIS — Z171 Estrogen receptor negative status [ER-]: Principal | ICD-10-CM

## 2017-09-18 DIAGNOSIS — C50512 Malignant neoplasm of lower-outer quadrant of left female breast: Secondary | ICD-10-CM

## 2017-09-18 DIAGNOSIS — I10 Essential (primary) hypertension: Secondary | ICD-10-CM | POA: Diagnosis not present

## 2017-09-18 DIAGNOSIS — K219 Gastro-esophageal reflux disease without esophagitis: Secondary | ICD-10-CM | POA: Diagnosis not present

## 2017-09-18 DIAGNOSIS — Z79899 Other long term (current) drug therapy: Secondary | ICD-10-CM | POA: Diagnosis not present

## 2017-09-18 DIAGNOSIS — Z7982 Long term (current) use of aspirin: Secondary | ICD-10-CM | POA: Diagnosis not present

## 2017-09-18 DIAGNOSIS — G620 Drug-induced polyneuropathy: Secondary | ICD-10-CM | POA: Diagnosis not present

## 2017-09-18 DIAGNOSIS — Z95828 Presence of other vascular implants and grafts: Secondary | ICD-10-CM

## 2017-09-18 DIAGNOSIS — T451X5S Adverse effect of antineoplastic and immunosuppressive drugs, sequela: Secondary | ICD-10-CM | POA: Diagnosis not present

## 2017-09-18 DIAGNOSIS — M479 Spondylosis, unspecified: Secondary | ICD-10-CM | POA: Diagnosis not present

## 2017-09-18 DIAGNOSIS — Z5111 Encounter for antineoplastic chemotherapy: Secondary | ICD-10-CM | POA: Diagnosis not present

## 2017-09-18 DIAGNOSIS — E876 Hypokalemia: Secondary | ICD-10-CM | POA: Diagnosis not present

## 2017-09-18 LAB — CBC WITH DIFFERENTIAL/PLATELET
Basophils Absolute: 0.1 10*3/uL (ref 0.0–0.1)
Basophils Relative: 1 %
Eosinophils Absolute: 0.1 10*3/uL (ref 0.0–0.5)
Eosinophils Relative: 2 %
HEMATOCRIT: 32.5 % — AB (ref 34.8–46.6)
Hemoglobin: 10.8 g/dL — ABNORMAL LOW (ref 11.6–15.9)
LYMPHS ABS: 0.8 10*3/uL — AB (ref 0.9–3.3)
LYMPHS PCT: 12 %
MCH: 29.3 pg (ref 25.1–34.0)
MCHC: 33.4 g/dL (ref 31.5–36.0)
MCV: 87.7 fL (ref 79.5–101.0)
MONO ABS: 0.6 10*3/uL (ref 0.1–0.9)
MONOS PCT: 9 %
NEUTROS ABS: 4.7 10*3/uL (ref 1.5–6.5)
NEUTROS PCT: 76 %
Platelets: 407 10*3/uL — ABNORMAL HIGH (ref 145–400)
RBC: 3.71 MIL/uL (ref 3.70–5.45)
RDW: 16.2 % — ABNORMAL HIGH (ref 11.2–16.1)
WBC: 6.2 10*3/uL (ref 3.9–10.3)

## 2017-09-18 LAB — COMPREHENSIVE METABOLIC PANEL
ALBUMIN: 3.7 g/dL (ref 3.5–5.0)
ALK PHOS: 67 U/L (ref 40–150)
ALT: 32 U/L (ref 0–55)
AST: 26 U/L (ref 5–34)
Anion gap: 8 (ref 3–11)
BUN: 7 mg/dL (ref 7–26)
CALCIUM: 9.4 mg/dL (ref 8.4–10.4)
CHLORIDE: 102 mmol/L (ref 98–109)
CO2: 29 mmol/L (ref 22–29)
Creatinine, Ser: 0.71 mg/dL (ref 0.60–1.10)
GFR calc non Af Amer: >60 mL/min (ref >60)
GLUCOSE: 118 mg/dL (ref 70–140)
Potassium: 3.5 mmol/L (ref 3.3–4.7)
SODIUM: 139 mmol/L (ref 136–145)
Total Bilirubin: 0.8 mg/dL (ref 0.2–1.2)
Total Protein: 7.2 g/dL (ref 6.4–8.3)

## 2017-09-18 MED ORDER — SODIUM CHLORIDE 0.9 % IV SOLN
80.0000 mg/m2 | Freq: Once | INTRAVENOUS | Status: AC
  Administered 2017-09-18: 168 mg via INTRAVENOUS
  Filled 2017-09-18: qty 28

## 2017-09-18 MED ORDER — DEXAMETHASONE SODIUM PHOSPHATE 10 MG/ML IJ SOLN
10.0000 mg | Freq: Once | INTRAMUSCULAR | Status: AC
  Administered 2017-09-18: 10 mg via INTRAVENOUS

## 2017-09-18 MED ORDER — SODIUM CHLORIDE 0.9% FLUSH
10.0000 mL | INTRAVENOUS | Status: DC | PRN
Start: 2017-09-18 — End: 2017-09-18
  Administered 2017-09-18: 10 mL via INTRAVENOUS
  Filled 2017-09-18: qty 10

## 2017-09-18 MED ORDER — DEXAMETHASONE SODIUM PHOSPHATE 10 MG/ML IJ SOLN
INTRAMUSCULAR | Status: AC
  Filled 2017-09-18: qty 1

## 2017-09-18 MED ORDER — FAMOTIDINE IN NACL 20-0.9 MG/50ML-% IV SOLN
20.0000 mg | Freq: Once | INTRAVENOUS | Status: AC
  Administered 2017-09-18: 20 mg via INTRAVENOUS

## 2017-09-18 MED ORDER — DIPHENHYDRAMINE HCL 50 MG/ML IJ SOLN
INTRAMUSCULAR | Status: AC
Start: 2017-09-18 — End: 2017-09-18
  Filled 2017-09-18: qty 1

## 2017-09-18 MED ORDER — HEPARIN SOD (PORK) LOCK FLUSH 100 UNIT/ML IV SOLN
500.0000 [IU] | Freq: Once | INTRAVENOUS | Status: AC | PRN
  Administered 2017-09-18: 500 [IU]
  Filled 2017-09-18: qty 5

## 2017-09-18 MED ORDER — SODIUM CHLORIDE 0.9 % IV SOLN
Freq: Once | INTRAVENOUS | Status: AC
  Administered 2017-09-18: 15:00:00 via INTRAVENOUS

## 2017-09-18 MED ORDER — DIPHENHYDRAMINE HCL 50 MG/ML IJ SOLN
25.0000 mg | Freq: Once | INTRAMUSCULAR | Status: AC
  Administered 2017-09-18: 25 mg via INTRAVENOUS

## 2017-09-18 MED ORDER — FAMOTIDINE IN NACL 20-0.9 MG/50ML-% IV SOLN
INTRAVENOUS | Status: AC
  Filled 2017-09-18: qty 50

## 2017-09-18 MED ORDER — SODIUM CHLORIDE 0.9% FLUSH
10.0000 mL | INTRAVENOUS | Status: DC | PRN
  Administered 2017-09-18: 10 mL
  Filled 2017-09-18: qty 10

## 2017-09-18 NOTE — Patient Instructions (Signed)
[facility] Discharge Instructions for Patients Receiving Chemotherapy  Today you received the following chemotherapy agents:  Taxol.  To help prevent nausea and vomiting after your treatment, we encourage you to take your nausea medication as directed.   If you develop nausea and vomiting that is not controlled by your nausea medication, call the clinic.   BELOW ARE SYMPTOMS THAT SHOULD BE REPORTED IMMEDIATELY:  *FEVER GREATER THAN 100.5 F  *CHILLS WITH OR WITHOUT FEVER  NAUSEA AND VOMITING THAT IS NOT CONTROLLED WITH YOUR NAUSEA MEDICATION  *UNUSUAL SHORTNESS OF BREATH  *UNUSUAL BRUISING OR BLEEDING  TENDERNESS IN MOUTH AND THROAT WITH OR WITHOUT PRESENCE OF ULCERS  *URINARY PROBLEMS  *BOWEL PROBLEMS  UNUSUAL RASH Items with * indicate a potential emergency and should be followed up as soon as possible.  Feel free to call the clinic should you have any questions or concerns. The clinic phone number is [phone].  Please show the CHEMO ALERT CARD at check-in to the Emergency Department and triage nurse.   

## 2017-09-21 ENCOUNTER — Encounter: Payer: Self-pay | Admitting: Hematology

## 2017-09-21 NOTE — Progress Notes (Signed)
Called patient to provide number to [employer] to apply for copay assistance via phone due to website having technical difficulties. I advised patient to have her gross household income available when she calls. She verbalized understanding. Advised her to let me know the decision either way and if approved, once letter received to provide me a copy to scan for billing. She verbalized understanding.

## 2017-09-23 NOTE — Progress Notes (Signed)
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 Highly suspicious mass within the LEFT breast at the 5:30 o'clock axis, 4 cm from the nipple, measuring 2.5 cm, corresponding to the mammographic finding and corresponding to 1 of the palpable lumps in the left breast. Ultrasound-guided biopsy is recommended. 2. Mildly prominent lymph node in the LEFT axilla, with normal cortical thickness but with questionable effacement of the fatty hilum. Ultrasound-guided core biopsy is recommended. 3. No evidence of malignancy within the RIGHT breast. Also, no evidence of malignancy within the second area of clinical concern in the upper outer left breast.      04/29/2017 Initial Diagnosis    Malignant neoplasm of lower-outer quadrant of left breast of female, estrogen receptor negative ([place])      04/29/2017 Receptors her2    Estrogen Receptor: 0%, NEGATIVE Progesterone Receptor: 0%, NEGATIVE Proliferation Marker Ki67: 90% HER2 NEGATIVE       04/29/2017 Initial Biopsy     Diagnosis 1. 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 - SEE COMMENT. 2. Lymph node, needle/core biopsy, left axilla - THERE IN NO EVIDENCE OF CARCINOMA IN 1 OF 1 LYMPH NODE (0/1).      05/14/2017 Imaging    MRI of Breast Bilateral 05/14/17  IMPRESSION: 1. 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 2. Non mass enhancement extending approximately 3.2 cm anterior to the mass in the lower inner quadrant of the left breast, suspicious for DCIS. There is no correlate on recent mammography. The overall extent of the mass and non mass enhancement is approximately 5 cm. 3. No MRI evidence of malignancy involving the right breast. 4. No pathologic lymphadenopathy. Upper normal sized left axillary lymph nodes, the largest of which was previously biopsied no evidence of metastatic disease.      05/15/2017 Imaging    Bone scan whole body 05/15/17 IMPRESSION: No findings specific for metastatic disease to bone.      05/15/2017 Imaging    CT CAP W contrast 05/15/17  IMPRESSION: 1. 18 mm soft tissue lesion inferior left breast compatible with known primary malignancy. 2. Mildly enlarged left axillary lymph nodes in this patient with biopsy-proven metastatic disease to the left axilla. 3. No evidence for lymphadenopathy elsewhere in the chest. No lymphadenopathy in the abdomen or pelvis. No evidence for metastatic disease in the abdomen or pelvis. 4. Tiny right perifissural lung nodule most likely subpleural lymph node. Attention on follow-up recommended. 5. 7 mm hypoattenuating lesion in the spleen, likely a cyst or pseudocyst. Attention on follow-up recommended. 6. Uterine fibroids. 7.  Aortic Atherosclerois (ICD10-170.0)      05/19/2017 Procedure    Port placement by Dr. [name] on 9/11/8      05/22/2017 -  Chemotherapy     neoadjuvant chemotherpy AC every 2 weeks  for 4 cycles starting 05/22/17 and completed on 07/07/17, followed by weekly paclitaxel for 12 cycles starting 07/24/17 to complete on 10/09/17       05/22/2017 Pathology Results    Diagnosis Breast, left, needle core biopsy, lower inner quad - DUCTAL CARCINOMA IN SITU. 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  The genes analyzed were the 46 genes on [facility]'s Common Cancers panel (APC, ATM, AXIN2, BARD1, BMPR1A, BRCA1, BRCA2, BRIP1, CDH1, CDKN2A, CHEK2, CTNNA1, DICER1, EPCAM, GREM1, HOXB13, KIT, MEN1, MLH1, MSH2, MSH3, MSH6, MUTYH, NBN, NF1, NTHL1, PALB2, PDGFRA, PMS2, POLD1, POLE, PTEN, RAD50, RAD51C, RAD51D, SDHA, SDHB, SDHC, SDHD, SMAD4, SMARCA4, STK11, TP53, TSC1, TSC2, VHL).        08/14/2017 Echocardiogram    EF 60-65%  Study Conclusions  - Left ventricle: The cavity size was normal. Systolic function was   normal. The estimated ejection fraction was in the range of 60%   to 65%. Wall motion was normal; there were no regional wall   motion abnormalities. Doppler parameters are consistent with   abnormal left ventricular relaxation (grade 1 diastolic   dysfunction). - Pulmonary arteries: PA peak pressure: 31 mm Hg (S).  Impressions:  - No change from September 2018 study (strain rate not performed on   that study).        CURRENT THERAPY: neoadjuvant chemotherapy AC every 2 weeks for 4 cycles starting 05/22/17 and completed on 07/07/17, followed by weekly paclitaxel for 12 cycles starting 07/24/17 to complete 10/09/17.   INTERVAL HISTORY: Ms. Hashimi returns for follow up as scheduled prior to cycle 10 weekly taxol. She noticed mild transient numbness to fingertips since last treatment, none in toes. Not affective function, grip, or strength. Back of her tongue slightly with citrus liquids or hot foods, no obvious sore. Occasionally has blood streaked  nasal discharge when blowing nose, no frank bleeding. No sinus pain, congestions, cough, fever, or chills. She is otherwise doing well.   REVIEW OF SYSTEMS:   Constitutional: Denies fevers, chills (+) abnormal weight loss Eyes: Denies blurriness of vision Ears, nose, mouth, throat, and face: Denies mucositis, sore throat, or epistaxis (+) sore tongue with citric liquids and hot food (+) blood streaked nasal discharge Respiratory: Denies cough, dyspnea or wheezes Cardiovascular: Denies palpitation, chest discomfort or lower extremity swelling Gastrointestinal:  Denies nausea, vomiting, constipation, diarrhea, heartburn or change in bowel habits Skin: Denies abnormal skin rashes Lymphatics: Denies new lymphadenopathy or easy bruising Neurological:Denies tingling or new weaknesses (+) mild intermittent numbness to fingertips, not affecting function, grip, or strength Behavioral/Psych: Mood is stable, no new changes  All other systems were reviewed with the patient and are negative.  MEDICAL HISTORY:  Past Medical History:  Diagnosis Date  . Anemia   . Breast cancer ([place])   . Cancer [facility])    left breast cancer  . Family history of breast cancer   . Genetic testing 05/28/2017   Common Cancers panel (46 genes) @ [facility] - No pathogenic mutations detected  . GERD (gastroesophageal reflux disease)   . Headache    Migraines  . Hypertension     SURGICAL HISTORY: Past Surgical History:  Procedure Laterality Date  . BREAST SURGERY     biopsy  . CESAREAN SECTION    . CHOLECYSTECTOMY N/A 02/07/2013   Procedure: LAPAROSCOPIC CHOLECYSTECTOMY WITH INTRAOPERATIVE CHOLANGIOGRAM;  Surgeon: Gwenyth Ober, MD;  Location: [facility];  Service: General;  Laterality: N/A;  . COLONOSCOPY W/ BIOPSIES AND POLYPECTOMY    . FOOT SURGERY  2003   lt foot bunionectomy  . MULTIPLE TOOTH EXTRACTIONS    . PORTACATH PLACEMENT Right 05/19/2017   Procedure: INSERTION PORT-A-CATH;  Surgeon: [name], MD;  Location: [facility];  Service: General;  Laterality: Right;    I have reviewed the social history and family history with the patient and they are unchanged from previous note.  ALLERGIES:  has No Known Allergies.  MEDICATIONS:  Current Outpatient Medications  Medication Sig Dispense Refill  . aspirin-acetaminophen-caffeine (EXCEDRIN MIGRAINE) 250-250-65 MG tablet Take 2 tablets by mouth 2 (two) times daily as needed for headache.    . Cholecalciferol (VITAMIN D) 2000 units CAPS Take 2,000 Units by mouth daily.    . hydrochlorothiazide (HYDRODIURIL) 25 MG tablet Take 25 mg by mouth daily.    Marland Kitchen lisinopril (PRINIVIL,ZESTRIL) 10 MG tablet Take 10 mgs by mouth daily  2  . potassium chloride SA (K-DUR,KLOR-CON) 20 MEQ tablet Take 1 tablet (20 mEq total) by mouth daily. 30 tablet 1  . ciprofloxacin (CILOXAN) 0.3 % ophthalmic solution     . fluocinonide ointment (LIDEX) 5.09 % Apply 1 application topically daily as needed (rosacea).    . ondansetron (ZOFRAN) 8 MG tablet Take 8 mg by mouth every 8 (eight) hours as needed for nausea or vomiting.    Marland Kitchen oxyCODONE (OXY IR/ROXICODONE) 5 MG immediate release tablet Take 1-2 tablets (5-10 mg total) by mouth every 6 (six) hours as needed for moderate pain, severe pain or breakthrough pain. (Patient not taking: Reported on 09/04/2017) 20 tablet 0   No current facility-administered medications for this visit.    Facility-Administered Medications Ordered in Other Visits  Medication Dose Route Frequency Provider Last Rate Last Dose  . sodium chloride flush (NS) 0.9 % injection 10 mL  10 mL Intravenous PRN [name], MD   10 mL at 08/07/17 1354  . sodium chloride flush (NS) 0.9 % injection 10 mL  10 mL Intracatheter PRN [name], MD   10 mL at 09/25/17 1530    PHYSICAL EXAMINATION: ECOG PERFORMANCE STATUS: 1 - Symptomatic but completely ambulatory  Vitals:   09/25/17 1258  BP: (!) 142/93  Pulse: 90  Resp: 20  Temp: 98.6 F (37 C)  SpO2: 98%     Filed Weights   09/25/17 1258  Weight: 211 lb 12.8 oz (96.1 kg)    GENERAL:alert, no distress and comfortable SKIN: skin color, texture, turgor are normal, no rashes or significant lesions EYES: normal, Conjunctiva are pink and non-injected, sclera clear OROPHARYNX:no exudate, no erythema and lips, buccal mucosa, and tongue normal  NECK: supple, thyroid normal size, non-tender, without nodularity LYMPH:  no palpable cervical, supraclavicular, axillary, or inguinal lymphadenopathy  LUNGS: clear to auscultation bilaterally with normal breathing effort HEART: regular rate & rhythm and no murmurs and no lower extremity edema ABDOMEN:abdomen soft, non-tender and normal bowel sounds. No hepatomegaly  Musculoskeletal:no cyanosis of digits and no clubbing  NEURO: alert & oriented x 3 with fluent speech, no focal motor/sensory deficits BREASTS: no palpable mass in right breast or axilla. (+) previously palpable left lower inner quadrant mass is not palpable; no palpable mass in  left axilla PAC without erythema   LABORATORY DATA:  I have reviewed the data as listed CBC Latest Ref Rng & Units 09/25/2017 09/18/2017 09/11/2017  WBC 3.9 - 10.3 K/uL 7.1 6.2 5.9  Hemoglobin 11.6 - 15.9 g/dL 11.2(L) 10.8(L) 11.2(L)  Hematocrit 34.8 - 46.6 % 34.3(L) 32.5(L) 33.7(L)  Platelets 145 - 400 K/uL 411(H) 407(H) 406(H)     CMP Latest Ref Rng & Units 09/25/2017 09/18/2017 09/11/2017  Glucose 70 - 140 mg/dL 101 118 116  BUN 7 - 26 mg/dL 9 7 9.1  Creatinine 0.60 - 1.10 mg/dL 0.70 0.71 0.7  Sodium 136 - 145 mmol/L 139 139 138  Potassium 3.3 - 4.7 mmol/L 3.7 3.5 3.7  Chloride 98 - 109 mmol/L 101 102 -  CO2 22 - 29 mmol/L '27 29 28  '$ Calcium 8.4 - 10.4 mg/dL 9.7 9.4 9.1  Total Protein 6.4 - 8.3 g/dL 7.5 7.2 7.0  Total Bilirubin 0.2 - 1.2 mg/dL 0.6 0.8 0.61  Alkaline Phos 40 - 150 U/L 67 67 64  AST 5 - 34 U/L '20 26 21  '$ ALT 0 - 55 U/L 27 32 29   PATHOLOGY  Diagnosis 04/29/17 1. Breast, left, needle core  biopsy, 5:30 o'clock, mass - INVASIVE DUCTAL CARCINOMA. - DUCTAL CARCINOMA IN SITU. - SEE COMMENT. 2. Lymph node, needle/core biopsy, left axilla - THERE IN NO EVIDENCE OF CARCINOMA IN 1 OF 1 LYMPH NODE (0/1). Microscopic Comment 1. The carcinoma appears grade 2-3. A breast prognostic profile will be performed and the results reported separately. The results were called to [facility] on 04/30/17. (JBK:gt, 04/30/17) 1. PROGNOSTIC INDICATORS Results: IMMUNOHISTOCHEMICAL AND MORPHOMETRIC ANALYSIS PERFORMED MANUALLY Estrogen Receptor: 0%, NEGATIVE Progesterone Receptor: 0%, NEGATIVE Proliferation Marker Ki67: 90% COMMENT: The negative hormone receptor study(ies) in this case has an internal positive control. 1. FLUORESCENCE IN-SITU HYBRIDIZATION Results: HER2 - NEGATIVE RATIO OF HER2/CEP17 SIGNALS 1.11 AVERAGE HER2 COPY NUMBER PER CELL 2.1   RADIOGRAPHIC STUDIES: I have personally reviewed the radiological images as listed and agreed with the findings in the report. No results found.   ASSESSMENT & PLAN: KANDYCE DIEGUEZ is a 52 y.o. african-american female with a history of HTN and arthritis in her back, presented with a palpable left breast mass.  1. 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  2. Genetics 3. HTN 4. Rosacea 5. Skin wound to PAC site and folliculitis in front upper chest, resolved 6. Hypokalemia 7. Neuropathy, G1  Ms. Anspach appears stable today. She has completed 9 cycles of neoadjuvant weekly taxol. She has developed low grade neuropathy in fingertips, secondary to taxol. I recommend she try B complex vitamin; will monitor closely. Has sore tongue with no obvious ulcers. I recommend warm salt water rinse TID PRN. Labs reviewed, anemia stable, Hgb 11.2; Cmet unremarkable. She will continue weekly Taxol for total 12 cycles. Will obtain  MRI 10/14/17 at completion to evaluate her response to neoadjuvant therapy prior to surgery. She will return in 1 week for cycle 11 and f/u with Dr. Burr Medico prior to last cycle in 2 weeks.   PLAN -B complex for neuropathy -Warm salt water rinse for tongue pain TID PRN -Labs reviewed, continue neoadjuvant weekly Taxol with cycle 10 today -Return in 1 week for cycle 11 -F/u with Dr. Burr Medico and final cycle 12 in 2 weeks -MRI 10/14/17 after completion of therapy   All questions were answered.  The patient knows to call the clinic with any problems, questions or concerns. No barriers to learning was detected.   Alla Feeling, NP 09/25/17

## 2017-09-24 ENCOUNTER — Encounter: Payer: Self-pay | Admitting: Hematology

## 2017-09-24 NOTE — Progress Notes (Signed)
Received physician form from [employer] copay relief for physician to complete and return to me to fax back.  Left on physician desk with a note to return to me.

## 2017-09-25 ENCOUNTER — Inpatient Hospital Stay (HOSPITAL_BASED_OUTPATIENT_CLINIC_OR_DEPARTMENT_OTHER): Payer: Federal, State, Local not specified - PPO | Admitting: Nurse Practitioner

## 2017-09-25 ENCOUNTER — Encounter: Payer: Self-pay | Admitting: Nurse Practitioner

## 2017-09-25 ENCOUNTER — Inpatient Hospital Stay: Payer: Federal, State, Local not specified - PPO

## 2017-09-25 VITALS — BP 142/93 | HR 90 | Temp 98.6°F | Resp 20 | Ht 66.0 in | Wt 211.8 lb

## 2017-09-25 DIAGNOSIS — M479 Spondylosis, unspecified: Secondary | ICD-10-CM

## 2017-09-25 DIAGNOSIS — C50512 Malignant neoplasm of lower-outer quadrant of left female breast: Secondary | ICD-10-CM

## 2017-09-25 DIAGNOSIS — K219 Gastro-esophageal reflux disease without esophagitis: Secondary | ICD-10-CM

## 2017-09-25 DIAGNOSIS — Z79899 Other long term (current) drug therapy: Secondary | ICD-10-CM

## 2017-09-25 DIAGNOSIS — Z7982 Long term (current) use of aspirin: Secondary | ICD-10-CM

## 2017-09-25 DIAGNOSIS — G620 Drug-induced polyneuropathy: Secondary | ICD-10-CM | POA: Diagnosis not present

## 2017-09-25 DIAGNOSIS — Z5111 Encounter for antineoplastic chemotherapy: Secondary | ICD-10-CM | POA: Diagnosis not present

## 2017-09-25 DIAGNOSIS — Z171 Estrogen receptor negative status [ER-]: Principal | ICD-10-CM

## 2017-09-25 DIAGNOSIS — L719 Rosacea, unspecified: Secondary | ICD-10-CM

## 2017-09-25 DIAGNOSIS — T451X5S Adverse effect of antineoplastic and immunosuppressive drugs, sequela: Secondary | ICD-10-CM

## 2017-09-25 DIAGNOSIS — I10 Essential (primary) hypertension: Secondary | ICD-10-CM

## 2017-09-25 DIAGNOSIS — E876 Hypokalemia: Secondary | ICD-10-CM

## 2017-09-25 DIAGNOSIS — Z95828 Presence of other vascular implants and grafts: Secondary | ICD-10-CM

## 2017-09-25 LAB — COMPREHENSIVE METABOLIC PANEL
ALBUMIN: 3.8 g/dL (ref 3.5–5.0)
ALT: 27 U/L (ref 0–55)
ANION GAP: 11 (ref 3–11)
AST: 20 U/L (ref 5–34)
Alkaline Phosphatase: 67 U/L (ref 40–150)
BILIRUBIN TOTAL: 0.6 mg/dL (ref 0.2–1.2)
BUN: 9 mg/dL (ref 7–26)
CHLORIDE: 101 mmol/L (ref 98–109)
CO2: 27 mmol/L (ref 22–29)
Calcium: 9.7 mg/dL (ref 8.4–10.4)
Creatinine, Ser: 0.7 mg/dL (ref 0.60–1.10)
GFR calc Af Amer: >60 mL/min (ref >60)
GFR calc non Af Amer: >60 mL/min (ref >60)
GLUCOSE: 101 mg/dL (ref 70–140)
POTASSIUM: 3.7 mmol/L (ref 3.3–4.7)
SODIUM: 139 mmol/L (ref 136–145)
TOTAL PROTEIN: 7.5 g/dL (ref 6.4–8.3)

## 2017-09-25 LAB — CBC WITH DIFFERENTIAL/PLATELET
BASOS ABS: 0.1 10*3/uL (ref 0.0–0.1)
Basophils Relative: 1 %
Eosinophils Absolute: 0.1 10*3/uL (ref 0.0–0.5)
Eosinophils Relative: 2 %
HEMATOCRIT: 34.3 % — AB (ref 34.8–46.6)
Hemoglobin: 11.2 g/dL — ABNORMAL LOW (ref 11.6–15.9)
LYMPHS ABS: 0.9 10*3/uL (ref 0.9–3.3)
LYMPHS PCT: 12 %
MCH: 28.9 pg (ref 25.1–34.0)
MCHC: 32.7 g/dL (ref 31.5–36.0)
MCV: 88.6 fL (ref 79.5–101.0)
MONO ABS: 0.8 10*3/uL (ref 0.1–0.9)
Monocytes Relative: 11 %
NEUTROS ABS: 5.3 10*3/uL (ref 1.5–6.5)
Neutrophils Relative %: 74 %
Platelets: 411 10*3/uL — ABNORMAL HIGH (ref 145–400)
RBC: 3.87 MIL/uL (ref 3.70–5.45)
RDW: 14.3 % (ref 11.2–16.1)
WBC: 7.1 10*3/uL (ref 3.9–10.3)

## 2017-09-25 MED ORDER — DIPHENHYDRAMINE HCL 50 MG/ML IJ SOLN
INTRAMUSCULAR | Status: AC
  Filled 2017-09-25: qty 1

## 2017-09-25 MED ORDER — DIPHENHYDRAMINE HCL 50 MG/ML IJ SOLN
25.0000 mg | Freq: Once | INTRAMUSCULAR | Status: AC
  Administered 2017-09-25: 25 mg via INTRAVENOUS

## 2017-09-25 MED ORDER — SODIUM CHLORIDE 0.9 % IV SOLN
80.0000 mg/m2 | Freq: Once | INTRAVENOUS | Status: AC
  Administered 2017-09-25: 168 mg via INTRAVENOUS
  Filled 2017-09-25: qty 28

## 2017-09-25 MED ORDER — FAMOTIDINE IN NACL 20-0.9 MG/50ML-% IV SOLN
20.0000 mg | Freq: Once | INTRAVENOUS | Status: AC
  Administered 2017-09-25: 20 mg via INTRAVENOUS

## 2017-09-25 MED ORDER — SODIUM CHLORIDE 0.9% FLUSH
10.0000 mL | INTRAVENOUS | Status: DC | PRN
  Administered 2017-09-25: 10 mL via INTRAVENOUS
  Filled 2017-09-25: qty 10

## 2017-09-25 MED ORDER — HEPARIN SOD (PORK) LOCK FLUSH 100 UNIT/ML IV SOLN
500.0000 [IU] | Freq: Once | INTRAVENOUS | Status: AC | PRN
  Administered 2017-09-25: 500 [IU]
  Filled 2017-09-25: qty 5

## 2017-09-25 MED ORDER — SODIUM CHLORIDE 0.9 % IV SOLN
Freq: Once | INTRAVENOUS | Status: AC
  Administered 2017-09-25: 14:00:00 via INTRAVENOUS

## 2017-09-25 MED ORDER — DEXAMETHASONE SODIUM PHOSPHATE 10 MG/ML IJ SOLN
10.0000 mg | Freq: Once | INTRAMUSCULAR | Status: AC
  Administered 2017-09-25: 10 mg via INTRAVENOUS

## 2017-09-25 MED ORDER — SODIUM CHLORIDE 0.9% FLUSH
10.0000 mL | INTRAVENOUS | Status: DC | PRN
  Administered 2017-09-25: 10 mL
  Filled 2017-09-25: qty 10

## 2017-09-25 MED ORDER — FAMOTIDINE IN NACL 20-0.9 MG/50ML-% IV SOLN
INTRAVENOUS | Status: AC
  Filled 2017-09-25: qty 50

## 2017-09-25 MED ORDER — DEXAMETHASONE SODIUM PHOSPHATE 10 MG/ML IJ SOLN
INTRAMUSCULAR | Status: AC
  Filled 2017-09-25: qty 1

## 2017-09-25 NOTE — Patient Instructions (Signed)
[facility] Discharge Instructions for Patients Receiving Chemotherapy  Today you received the following chemotherapy agents:  Taxol.  To help prevent nausea and vomiting after your treatment, we encourage you to take your nausea medication as directed.   If you develop nausea and vomiting that is not controlled by your nausea medication, call the clinic.   BELOW ARE SYMPTOMS THAT SHOULD BE REPORTED IMMEDIATELY:  *FEVER GREATER THAN 100.5 F  *CHILLS WITH OR WITHOUT FEVER  NAUSEA AND VOMITING THAT IS NOT CONTROLLED WITH YOUR NAUSEA MEDICATION  *UNUSUAL SHORTNESS OF BREATH  *UNUSUAL BRUISING OR BLEEDING  TENDERNESS IN MOUTH AND THROAT WITH OR WITHOUT PRESENCE OF ULCERS  *URINARY PROBLEMS  *BOWEL PROBLEMS  UNUSUAL RASH Items with * indicate a potential emergency and should be followed up as soon as possible.  Feel free to call the clinic should you have any questions or concerns. The clinic phone number is [phone].  Please show the CHEMO ALERT CARD at check-in to the Emergency Department and triage nurse.   

## 2017-09-30 ENCOUNTER — Encounter: Payer: Self-pay | Admitting: Hematology

## 2017-09-30 NOTE — Progress Notes (Signed)
Received signed physician form for [facility].  Faxed to [facility]. Fax received ok per confirmation sheet.

## 2017-10-02 ENCOUNTER — Inpatient Hospital Stay: Payer: Federal, State, Local not specified - PPO

## 2017-10-02 ENCOUNTER — Other Ambulatory Visit: Payer: Federal, State, Local not specified - PPO

## 2017-10-02 VITALS — BP 121/69 | HR 96 | Temp 98.3°F | Resp 18 | Wt 208.5 lb

## 2017-10-02 DIAGNOSIS — M479 Spondylosis, unspecified: Secondary | ICD-10-CM | POA: Diagnosis not present

## 2017-10-02 DIAGNOSIS — Z171 Estrogen receptor negative status [ER-]: Principal | ICD-10-CM

## 2017-10-02 DIAGNOSIS — Z5111 Encounter for antineoplastic chemotherapy: Secondary | ICD-10-CM | POA: Diagnosis not present

## 2017-10-02 DIAGNOSIS — C50512 Malignant neoplasm of lower-outer quadrant of left female breast: Secondary | ICD-10-CM | POA: Diagnosis not present

## 2017-10-02 DIAGNOSIS — G620 Drug-induced polyneuropathy: Secondary | ICD-10-CM | POA: Diagnosis not present

## 2017-10-02 DIAGNOSIS — I10 Essential (primary) hypertension: Secondary | ICD-10-CM | POA: Diagnosis not present

## 2017-10-02 DIAGNOSIS — T451X5S Adverse effect of antineoplastic and immunosuppressive drugs, sequela: Secondary | ICD-10-CM | POA: Diagnosis not present

## 2017-10-02 DIAGNOSIS — E876 Hypokalemia: Secondary | ICD-10-CM | POA: Diagnosis not present

## 2017-10-02 DIAGNOSIS — K219 Gastro-esophageal reflux disease without esophagitis: Secondary | ICD-10-CM | POA: Diagnosis not present

## 2017-10-02 DIAGNOSIS — Z79899 Other long term (current) drug therapy: Secondary | ICD-10-CM | POA: Diagnosis not present

## 2017-10-02 DIAGNOSIS — Z7982 Long term (current) use of aspirin: Secondary | ICD-10-CM | POA: Diagnosis not present

## 2017-10-02 DIAGNOSIS — L719 Rosacea, unspecified: Secondary | ICD-10-CM | POA: Diagnosis not present

## 2017-10-02 LAB — COMPREHENSIVE METABOLIC PANEL
ALK PHOS: 71 U/L (ref 40–150)
ALT: 22 U/L (ref 0–55)
AST: 20 U/L (ref 5–34)
Albumin: 3.8 g/dL (ref 3.5–5.0)
Anion gap: 10 (ref 3–11)
BUN: 11 mg/dL (ref 7–26)
CALCIUM: 9.8 mg/dL (ref 8.4–10.4)
CO2: 27 mmol/L (ref 22–29)
CREATININE: 0.76 mg/dL (ref 0.60–1.10)
Chloride: 100 mmol/L (ref 98–109)
Glucose, Bld: 141 mg/dL — ABNORMAL HIGH (ref 70–140)
Potassium: 3.6 mmol/L (ref 3.3–4.7)
SODIUM: 137 mmol/L (ref 136–145)
Total Bilirubin: 0.7 mg/dL (ref 0.2–1.2)
Total Protein: 7.7 g/dL (ref 6.4–8.3)

## 2017-10-02 LAB — CBC WITH DIFFERENTIAL/PLATELET
Basophils Absolute: 0.1 10*3/uL (ref 0.0–0.1)
Basophils Relative: 1 %
Eosinophils Absolute: 0.1 10*3/uL (ref 0.0–0.5)
Eosinophils Relative: 1 %
HCT: 33.7 % — ABNORMAL LOW (ref 34.8–46.6)
HEMOGLOBIN: 11.3 g/dL — AB (ref 11.6–15.9)
LYMPHS ABS: 0.9 10*3/uL (ref 0.9–3.3)
LYMPHS PCT: 13 %
MCH: 29.2 pg (ref 25.1–34.0)
MCHC: 33.5 g/dL (ref 31.5–36.0)
MCV: 87.1 fL (ref 79.5–101.0)
Monocytes Absolute: 0.7 10*3/uL (ref 0.1–0.9)
Monocytes Relative: 10 %
NEUTROS ABS: 5.3 10*3/uL (ref 1.5–6.5)
Neutrophils Relative %: 75 %
Platelets: 430 10*3/uL — ABNORMAL HIGH (ref 145–400)
RBC: 3.87 MIL/uL (ref 3.70–5.45)
RDW: 15 % (ref 11.2–16.1)
WBC: 7.1 10*3/uL (ref 3.9–10.3)

## 2017-10-02 MED ORDER — SODIUM CHLORIDE 0.9 % IV SOLN
Freq: Once | INTRAVENOUS | Status: AC
  Administered 2017-10-02: 13:00:00 via INTRAVENOUS

## 2017-10-02 MED ORDER — DEXAMETHASONE SODIUM PHOSPHATE 10 MG/ML IJ SOLN
10.0000 mg | Freq: Once | INTRAMUSCULAR | Status: AC
  Administered 2017-10-02: 10 mg via INTRAVENOUS

## 2017-10-02 MED ORDER — FAMOTIDINE IN NACL 20-0.9 MG/50ML-% IV SOLN
INTRAVENOUS | Status: AC
  Filled 2017-10-02: qty 50

## 2017-10-02 MED ORDER — DEXAMETHASONE SODIUM PHOSPHATE 10 MG/ML IJ SOLN
INTRAMUSCULAR | Status: AC
  Filled 2017-10-02: qty 1

## 2017-10-02 MED ORDER — FAMOTIDINE IN NACL 20-0.9 MG/50ML-% IV SOLN
20.0000 mg | Freq: Once | INTRAVENOUS | Status: AC
  Administered 2017-10-02: 20 mg via INTRAVENOUS

## 2017-10-02 MED ORDER — HEPARIN SOD (PORK) LOCK FLUSH 100 UNIT/ML IV SOLN
500.0000 [IU] | Freq: Once | INTRAVENOUS | Status: AC | PRN
  Administered 2017-10-02: 500 [IU]
  Filled 2017-10-02: qty 5

## 2017-10-02 MED ORDER — SODIUM CHLORIDE 0.9 % IV SOLN
80.0000 mg/m2 | Freq: Once | INTRAVENOUS | Status: AC
  Administered 2017-10-02: 168 mg via INTRAVENOUS
  Filled 2017-10-02: qty 28

## 2017-10-02 MED ORDER — SODIUM CHLORIDE 0.9% FLUSH
10.0000 mL | INTRAVENOUS | Status: DC | PRN
  Administered 2017-10-02: 10 mL
  Filled 2017-10-02: qty 10

## 2017-10-02 MED ORDER — DIPHENHYDRAMINE HCL 50 MG/ML IJ SOLN
INTRAMUSCULAR | Status: AC
  Filled 2017-10-02: qty 1

## 2017-10-02 MED ORDER — DIPHENHYDRAMINE HCL 50 MG/ML IJ SOLN
25.0000 mg | Freq: Once | INTRAMUSCULAR | Status: AC
  Administered 2017-10-02: 25 mg via INTRAVENOUS

## 2017-10-02 NOTE — Patient Instructions (Addendum)
[facility] Discharge Instructions for Patients Receiving Chemotherapy  Today you received the following chemotherapy agents:  Taxol.  To help prevent nausea and vomiting after your treatment, we encourage you to take your nausea medication as directed.   If you develop nausea and vomiting that is not controlled by your nausea medication, call the clinic.   BELOW ARE SYMPTOMS THAT SHOULD BE REPORTED IMMEDIATELY:  *FEVER GREATER THAN 100.5 F  *CHILLS WITH OR WITHOUT FEVER  NAUSEA AND VOMITING THAT IS NOT CONTROLLED WITH YOUR NAUSEA MEDICATION  *UNUSUAL SHORTNESS OF BREATH  *UNUSUAL BRUISING OR BLEEDING  TENDERNESS IN MOUTH AND THROAT WITH OR WITHOUT PRESENCE OF ULCERS  *URINARY PROBLEMS  *BOWEL PROBLEMS  UNUSUAL RASH Items with * indicate a potential emergency and should be followed up as soon as possible.  Feel free to call the clinic should you have any questions or concerns. The clinic phone number is [phone].  Please show the CHEMO ALERT CARD at check-in to the Emergency Department and triage nurse.   

## 2017-10-07 NOTE — Progress Notes (Signed)
[place]  Telephone:(336) [phone] Fax:([phone]) [phone]  Clinic follow up Note   Patient Care Team: [name], MD as PCP - General (Family Medicine) [name], MD as Consulting Physician (Hematology) [name], MD as Consulting Physician (General Surgery) [name], MD as Consulting Physician (Radiation Oncology)   Date of Service:  10/09/2017   CHIEF COMPLAINTS:  Malignant neoplasm of lower-outer quadrant of left breast of female, triple negative   Oncology History   Cancer Staging Malignant neoplasm of lower-outer quadrant of left breast of female, estrogen receptor negative ([employer]) Staging form: Breast, AJCC 8th Edition - Clinical: Stage IIB (cT2, cN0(f), cM0, G3, ER: Negative, PR: Negative, HER2: Negative) - Unsigned       Malignant neoplasm of lower-outer quadrant of left breast of female, estrogen receptor negative ([place])   04/27/2017 Mammogram    IMPRESSION: 1. Highly suspicious mass within the LEFT breast at the 5:30 o'clock axis, 4 cm from the nipple, measuring 2.5 cm, corresponding to the mammographic finding and corresponding to 1 of the palpable lumps in the left breast. Ultrasound-guided biopsy is recommended. 2. Mildly prominent lymph node in the LEFT axilla, with normal cortical thickness but with questionable effacement of the fatty hilum. Ultrasound-guided core biopsy is recommended. 3. No evidence of malignancy within the RIGHT breast. Also, no evidence of malignancy within the second area of clinical concern in the upper outer left breast.      04/29/2017 Initial Diagnosis    Malignant neoplasm of lower-outer quadrant of left breast of female, estrogen receptor negative ([place])      04/29/2017 Receptors her2    Estrogen Receptor: 0%, NEGATIVE Progesterone Receptor: 0%, NEGATIVE Proliferation Marker Ki67: 90% HER2 NEGATIVE       04/29/2017 Initial Biopsy     Diagnosis 1. Breast, left, needle core biopsy, 5:30 o'clock, mass -  INVASIVE DUCTAL CARCINOMA, G2-3 - DUCTAL CARCINOMA IN SITU. - SEE COMMENT. 2. Lymph node, needle/core biopsy, left axilla - THERE IN NO EVIDENCE OF CARCINOMA IN 1 OF 1 LYMPH NODE (0/1).      05/14/2017 Imaging    MRI of Breast Bilateral 05/14/17  IMPRESSION: 1. 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 2. Non mass enhancement extending approximately 3.2 cm anterior to the mass in the lower inner quadrant of the left breast, suspicious for DCIS. There is no correlate on recent mammography. The overall extent of the mass and non mass enhancement is approximately 5 cm. 3. No MRI evidence of malignancy involving the right breast. 4. No pathologic lymphadenopathy. Upper normal sized left axillary lymph nodes, the largest of which was previously biopsied no evidence of metastatic disease.      05/15/2017 Imaging    Bone scan whole body 05/15/17 IMPRESSION: No findings specific for metastatic disease to bone.      05/15/2017 Imaging    CT CAP W contrast 05/15/17  IMPRESSION: 1. 18 mm soft tissue lesion inferior left breast compatible with known primary malignancy. 2. Mildly enlarged left axillary lymph nodes in this patient with biopsy-proven metastatic disease to the left axilla. 3. No evidence for lymphadenopathy elsewhere in the chest. No lymphadenopathy in the abdomen or pelvis. No evidence for metastatic disease in the abdomen or pelvis. 4. Tiny right perifissural lung nodule most likely subpleural lymph node. Attention on follow-up recommended. 5. 7 mm hypoattenuating lesion in the spleen, likely a cyst or pseudocyst. Attention on follow-up recommended. 6.  Uterine fibroids. 7.  Aortic Atherosclerois (ICD10-170.0)      05/19/2017 Procedure    Port placement by Dr. [name] on 9/11/8      05/22/2017 -  Chemotherapy     neoadjuvant chemotherpy AC every 2 weeks for 4 cycles  starting 05/22/17 and completed on 07/07/17, followed by weekly paclitaxel for 12 cycles starting 07/24/17 to complete on 10/09/17       05/22/2017 Pathology Results    Diagnosis Breast, left, needle core biopsy, lower inner quad - DUCTAL CARCINOMA IN SITU. 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  The genes analyzed were the 46 genes on [facility]'s Common Cancers panel (APC, ATM, AXIN2, BARD1, BMPR1A, BRCA1, BRCA2, BRIP1, CDH1, CDKN2A, CHEK2, CTNNA1, DICER1, EPCAM, GREM1, HOXB13, KIT, MEN1, MLH1, MSH2, MSH3, MSH6, MUTYH, NBN, NF1, NTHL1, PALB2, PDGFRA, PMS2, POLD1, POLE, PTEN, RAD50, RAD51C, RAD51D, SDHA, SDHB, SDHC, SDHD, SMAD4, SMARCA4, STK11, TP53, TSC1, TSC2, VHL).        08/14/2017 Echocardiogram    EF 60-65%  Study Conclusions  - Left ventricle: The cavity size was normal. Systolic function was   normal. The estimated ejection fraction was in the range of 60%   to 65%. Wall motion was normal; there were no regional wall   motion abnormalities. Doppler parameters are consistent with   abnormal left ventricular relaxation (grade 1 diastolic   dysfunction). - Pulmonary arteries: PA peak pressure: 31 mm Hg (S).  Impressions:  - No change from September 2018 study (strain rate not performed on   that study).         HISTORY OF PRESENTING ILLNESS: 8/29/8  Briana Price 52 y.o. 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  She presents to the clinic today with her mother and father.   She first noticed her a mass in her left breast on the 17th of August, It was a hard knot, no pain or tenderness. Her mammogram form 03/24/17 and other previous mammograms were all normal.   In the past she was diagnosed with HTN and takes hydrochlorothiazide and lisinopril. She had her gallbladder removed. Her maternal grandmother,  maternal aunt and paternal aunt had breast cancer. 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 The pain can last up to 3 days.   Today she denies skin change around breast, pain, fatigue or weight loss. She still has regular periods that last 3 days. She work in [employer] for the [employer].   GYN HISTORY  Menarchal: 14 LMP: 05/01/17 Contraceptive: No HRT: n/a GP: G1P1 at age 19, she has a son  CURRENT THERAPY: neoadjuvant chemotherapy AC every 2 weeks for 4 cycles starting 05/22/17 and completed on 07/07/17, followed by weekly paclitaxel for 12 cycles starting 07/24/17 to complete 10/09/17.   INTERVAL HISTORY:  Briana Price is here for a follow up. She presents to the infusion room today with her mother for her last treatment of taxol. She notes she is doing well. 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 She is able to pick up small things.   She will have a Breast MRI on 10/17/17 and an appointment with Dr. [name] a few days after. She reports that she may want reconstruction in the future.   On review of systems, pt denies new pain, or any other complaints at this time. Pertinent positives are  listed and detailed within the above HPI.     MEDICAL HISTORY:  Past Medical History:  Diagnosis Date  . Anemia   . Breast cancer ([place])   . Cancer [facility])    left breast cancer  . Family history of breast cancer   . Genetic testing 05/28/2017   Common Cancers panel (46 genes) @ [facility] - No pathogenic mutations detected  . GERD (gastroesophageal reflux disease)   . Headache    Migraines  . Hypertension    SURGICAL HISTORY: Past Surgical History:  Procedure Laterality Date  . BREAST SURGERY     biopsy  . CESAREAN SECTION    . CHOLECYSTECTOMY N/A 02/07/2013   Procedure: LAPAROSCOPIC CHOLECYSTECTOMY WITH INTRAOPERATIVE CHOLANGIOGRAM;  Surgeon: Gwenyth Ober, MD;  Location: [facility];  Service: General;  Laterality: N/A;  . COLONOSCOPY W/ BIOPSIES AND  POLYPECTOMY    . FOOT SURGERY  2003   lt foot bunionectomy  . MULTIPLE TOOTH EXTRACTIONS    . PORTACATH PLACEMENT Right 05/19/2017   Procedure: INSERTION PORT-A-CATH;  Surgeon: [name], MD;  Location: [facility];  Service: General;  Laterality: Right;   SOCIAL HISTORY: Social History   Socioeconomic History  . Marital status: Single    Spouse name: Not on file  . Number of children: Not on file  . Years of education: Not on file  . Highest education level: Not on file  Social Needs  . Financial resource strain: Not on file  . Food insecurity - worry: Not on file  . Food insecurity - inability: Not on file  . Transportation needs - medical: Not on file  . Transportation needs - non-medical: Not on file  Occupational History  . Not on file  Tobacco Use  . Smoking status: Former Smoker    Types: Cigarettes  . Smokeless tobacco: Never Used  . Tobacco comment: 15-20 years ago  Substance and Sexual Activity  . Alcohol use: No  . Drug use: No  . Sexual activity: Not on file  Other Topics Concern  . Not on file  Social History Narrative   Tobacco use   Cigarettes: Never smoked    Tobacco history last updated 01/23/2014   No smoking   No tobacco exposure   No alcohol   Caffeine : yes soda   No recreational drug use   Occupation :employed ,[employer] 01/25/2013   Martial status : single    Children: Louie Casa 01/25/2013   73 year old son   FAMILY HISTORY: Family History  Problem Relation Age of Onset  . Breast cancer Maternal Grandmother        dx 91s; deceased 38  . Breast cancer Paternal Aunt 25       recurrence vs. 2nd primary at 71; currently 55  . Breast cancer Maternal Aunt 62       deceased 81  . Hypertension Mother   . Breast cancer Other        pat grandfather's sister; dx 25s  . Leukemia Paternal Uncle        deceased 4  . Breast cancer Other        paternal distant cousin; dx 84s   ALLERGIES:  has No Known Allergies.  MEDICATIONS:  Current  Outpatient Medications  Medication Sig Dispense Refill  . aspirin-acetaminophen-caffeine (EXCEDRIN MIGRAINE) 250-250-65 MG tablet Take 2 tablets by mouth 2 (two) times daily as needed for headache.    . Cholecalciferol (VITAMIN D) 2000 units CAPS Take 2,000  Units by mouth daily.    . fluocinonide ointment (LIDEX) 4.26 % Apply 1 application topically daily as needed (rosacea).    . hydrochlorothiazide (HYDRODIURIL) 25 MG tablet Take 25 mg by mouth daily.    Marland Kitchen lisinopril (PRINIVIL,ZESTRIL) 10 MG tablet Take 10 mgs by mouth daily  2  . ondansetron (ZOFRAN) 8 MG tablet Take 8 mg by mouth every 8 (eight) hours as needed for nausea or vomiting.    . potassium chloride SA (K-DUR,KLOR-CON) 20 MEQ tablet Take 1 tablet (20 mEq total) by mouth daily. 30 tablet 1  . ciprofloxacin (CILOXAN) 0.3 % ophthalmic solution     . oxyCODONE (OXY IR/ROXICODONE) 5 MG immediate release tablet Take 1-2 tablets (5-10 mg total) by mouth every 6 (six) hours as needed for moderate pain, severe pain or breakthrough pain. (Patient not taking: Reported on 09/04/2017) 20 tablet 0   No current facility-administered medications for this visit.    Facility-Administered Medications Ordered in Other Visits  Medication Dose Route Frequency Provider Last Rate Last Dose  . heparin lock flush 100 unit/mL  500 Units Intracatheter Once PRN [name], MD      . PACLitaxel (TAXOL) 168 mg in sodium chloride 0.9 % 250 mL chemo infusion (</= '80mg'$ /m2)  80 mg/m2 (Treatment Plan Recorded) Intravenous Once [name], MD 278 mL/hr at 10/09/17 1630 168 mg at 10/09/17 1630  . sodium chloride flush (NS) 0.9 % injection 10 mL  10 mL Intravenous PRN [name], MD   10 mL at 08/07/17 1354  . sodium chloride flush (NS) 0.9 % injection 10 mL  10 mL Intracatheter PRN [name], MD       REVIEW OF SYSTEMS:   Constitutional: Denies fevers, chills or abnormal night sweats  Eyes: Denies blurriness of vision, double vision or watery eyes Ears, nose, mouth,  throat, and face: Denies mucositis or sore throat Respiratory: Denies cough, dyspnea or wheezes Cardiovascular: Denies palpitation, chest discomfort or lower extremity swelling (+) mild heart murmur Gastrointestinal:  Denies nausea, heartburn or change in bowel habits Skin: Denies abnormal skin rashes Lymphatics: Denies new lymphadenopathy or easy bruising Neurological:Denies new weaknesses (+) neuropathy in fingers MSK: (+) joint pain in hand  Behavioral/Psych: Mood is stable, no new changes. Breast: negative All other systems were reviewed with the patient and are negative.  PHYSICAL EXAMINATION: ECOG PERFORMANCE STATUS: 1 BP (!) 141/92 (BP Location: Left Arm, Patient Position: Sitting) Comment: Mrytle RN is aware  Pulse 100   Temp 98.3 F (36.8 C) (Oral)   Resp 18   Ht '5\' 6"'$  (1.676 m)   Wt 211 lb 11.2 oz (96 kg)   SpO2 100%   BMI 34.17 kg/m   Vitals:   10/09/17 1500  BP: (!) 141/92  Pulse: 100  Resp: 18  Temp: 98.3 F (36.8 C)  TempSrc: Oral  SpO2: 100%  Weight: 211 lb 11.2 oz (96 kg)  Height: '5\' 6"'$  (1.676 m)     GENERAL:alert, no distress and comfortable SKIN: skin color, texture, turgor are normal, no rashes or significant lesions EYES: normal, conjunctiva are pink and non-injected, sclera clear OROPHARYNX:no exudate, no erythema and lips, buccal mucosa, and tongue normal  NECK: supple, thyroid normal size, non-tender, without nodularity LYMPH:  no palpable lymphadenopathy in the cervical, axillary or inguinal LUNGS: clear to auscultation and percussion with normal breathing effort HEART: regular rate and no lower extremity edema (+) mild Heart murmur, related to anemia  ABDOMEN:abdomen soft, non-tender and normal bowel sounds Musculoskeletal:no cyanosis of  digits and no clubbing  PSYCH: alert & oriented x 3 with fluent speech NEURO: no focal motor/sensory deficits Breast exam deferred today   LABORATORY DATA:  I have reviewed the data as listed CBC Latest  Ref Rng & Units 10/09/2017 10/02/2017 09/25/2017  WBC 3.9 - 10.3 K/uL 7.2 7.1 7.1  Hemoglobin 11.6 - 15.9 g/dL 11.1(L) 11.3(L) 11.2(L)  Hematocrit 34.8 - 46.6 % 33.2(L) 33.7(L) 34.3(L)  Platelets 145 - 400 K/uL 451(H) 430(H) 411(H)   CMP Latest Ref Rng & Units 10/09/2017 10/02/2017 09/25/2017  Glucose 70 - 140 mg/dL 157(H) 141(H) 101  BUN 7 - 26 mg/dL '8 11 9  '$ Creatinine 0.60 - 1.10 mg/dL 0.76 0.76 0.70  Sodium 136 - 145 mmol/L 139 137 139  Potassium 3.5 - 5.1 mmol/L 3.5 3.6 3.7  Chloride 98 - 109 mmol/L 101 100 101  CO2 22 - 29 mmol/L '26 27 27  '$ Calcium 8.4 - 10.4 mg/dL 9.6 9.8 9.7  Total Protein 6.4 - 8.3 g/dL 7.4 7.7 7.5  Total Bilirubin 0.2 - 1.2 mg/dL 0.6 0.7 0.6  Alkaline Phos 40 - 150 U/L 68 71 67  AST 5 - 34 U/L '18 20 20  '$ ALT 0 - 55 U/L '20 22 27   '$ PATHOLOGY  Diagnosis 04/29/17 1. Breast, left, needle core biopsy, 5:30 o'clock, mass - INVASIVE DUCTAL CARCINOMA. - DUCTAL CARCINOMA IN SITU. - SEE COMMENT. 2. Lymph node, needle/core biopsy, left axilla - THERE IN NO EVIDENCE OF CARCINOMA IN 1 OF 1 LYMPH NODE (0/1). Microscopic Comment 1. The carcinoma appears grade 2-3. A breast prognostic profile will be performed and the results reported separately. The results were called to [facility] on 04/30/17. (JBK:gt, 04/30/17) 1. PROGNOSTIC INDICATORS Results: IMMUNOHISTOCHEMICAL AND MORPHOMETRIC ANALYSIS PERFORMED MANUALLY Estrogen Receptor: 0%, NEGATIVE Progesterone Receptor: 0%, NEGATIVE Proliferation Marker Ki67: 90% COMMENT: The negative hormone receptor study(ies) in this case has an internal positive control. 1. FLUORESCENCE IN-SITU HYBRIDIZATION Results: HER2 - NEGATIVE RATIO OF HER2/CEP17 SIGNALS 1.11 AVERAGE HER2 COPY NUMBER PER CELL 2.1    PROCEDURES   ECHO 05/14/17 Study Conclusions - Left ventricle: The cavity size was normal. Systolic function was normal. The estimated ejection fraction was in the range of 55% to 60%. Wall motion was normal; there  were no regional wall motion abnormalities. Left ventricular diastolic function parameters were normal.  RADIOGRAPHIC STUDIES: I have personally reviewed the radiological images as listed and agreed with the findings in the report.  Bone scan whole body 05/15/17 IMPRESSION: No findings specific for metastatic disease to bone.  CT CAP W contrast 05/15/17  IMPRESSION: 1. 18 mm soft tissue lesion inferior left breast compatible with known primary malignancy. 2. Mildly enlarged left axillary lymph nodes in this patient with biopsy-proven metastatic disease to the left axilla. 3. No evidence for lymphadenopathy elsewhere in the chest. No lymphadenopathy in the abdomen or pelvis. No evidence for metastatic disease in the abdomen or pelvis. 4. Tiny right perifissural lung nodule most likely subpleural lymph node. Attention on follow-up recommended. 5. 7 mm hypoattenuating lesion in the spleen, likely a cyst or pseudocyst. Attention on follow-up recommended. 6. Uterine fibroids. 7.  Aortic Atherosclerois (ICD10-170.0)  Diagnostic Mammogram 04/27/17 IMPRESSION: 1. Highly suspicious mass within the LEFT breast at the 5:30 o'clock axis, 4 cm from the nipple, measuring 2.5 cm, corresponding to the mammographic finding and corresponding to 1 of the palpable lumps in the left breast. Ultrasound-guided biopsy is recommended. 2. Mildly prominent lymph node in the LEFT axilla,  with normal ortical thickness but with questionable effacement of the fatty hilum. Ultrasound-guided core biopsy is recommended. 3. No evidence of malignancy within the RIGHT breast. Also, no evidence of malignancy within the second area of clinical concern in the upper outer left breast.  No results found. ASSESSMENT & PLAN:  Briana Price is a 52 y.o. african-american female with a history of HTN and arthritis in her back, presented with a palpable left breast mass.  1. Malignant neoplasm of lower-outer quadrant of left breast  of female, invasive ductal carcinoma, c2T0M0, Stage IIB, ER/PR: negative, HER2: negative, Grade 3.  -We discussed her mammogram, ultrasound findings, and initial biopsy results with patient and her family members in details.  -She presented with a palpable left breast mass, measures 2.5 cm on ultrasound, with a prominent lymph node. 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 -She is a candidate for lumpectomy and sentinel lymph node biopsy. She was seen by surgeon Dr. [name]. Due to her young age and triple negative disease, we recommended her to see genetics, to ruled out inheritable genetic syndrome. -I reviewed her breast MRI findings, which showed additional non-mass enhancement in the left breast, which is anterior to the biopsy proven breast cancer, in the lower inner quadrant. I recommend her to have additional biopsy, she agrees, we'll schedule for this week. -I reviewed her CT scan and bone scan findings in great detail, no evidence of distant metastasis. -both MRI and a CT scan showed enlarged left axillary lymph node, but biopsy was negative for malignancy, although I still have some concern that she may have metastatic adenopathy. -She agreed to proceed with neoadjuvant chemotherapy. She has concerns about the secondary malignancy from Adriamycin. However given the extensive of her disease, I strongly encouraged her to take Adriamycin and Cytoxan, followed by Taxol.  -She underwent AC with Onpro on 05/22/17-07/07/17. She has tolerated well overall. After second cycle she developed Leukocytosis secondary to Onpro. Onpro was skipped with her Cycle 3 treatment, cycle 4 was pushed back 1 week due to neutropenia.  -On a previous exam, she had good clinical response to chemotherapy. Her left breast mass is not palpable anymore.  -I previously discussed her mild heart murmur is likely related to her anemia. Her 05/2017 ECHO shows no  structural issues with her valves.  -She started weekly taxol on 07/24/17 and has been tolerating well so far. wil complete today  -Will do breast MRI in 10/14/17 to evaluate her response to neoadjuvant chemotherapy -she is scheduled to see Dr. [name] after breast MRI. -Pt has questions about surgery options. I explained that mastectomy and lumpectomy plus radiation have the same risk of recurrence. She is scared about radiation. I advised her to speak to Dr. Lisbeth Renshaw about all her concerns. I will also discuss her case at tumor board.  -Labs reviewed today (10/09/17) her K is 3.5 and I want her to continue to keep taking the K supplement for now. May stop in the future -She is tolerating chemotherapy well, mild intermittent neuropathy, labs reviewed and adequate to proceed with last cycle of taxol today -F/u in 3 weeks to   2. Genetics -Based on her significant familial breast cancer history, her young age and triple negative disease, I suggest she gets genetic testing for possible gene mutations.  -Apt with Roma Kayser in genetics on 05/28/17 -Positive for Variant of Uncertain Significance: ATM, but negative for other 46 genes on [facility]'s Common  Cancers panel (APC, ATM, AXIN2, BARD1, BMPR1A, BRCA1, BRCA2, BRIP1, CDH1, CDKN2A, CHEK2, CTNNA1, DICER1, EPCAM, GREM1, HOXB13, KIT, MEN1, MLH1, MSH2, MSH3, MSH6, MUTYH, NBN, NF1, NTHL1, PALB2, PDGFRA, PMS2, POLD1, POLE, PTEN, RAD50, RAD51C, RAD51D, SDHA, SDHB, SDHC, SDHD, SMAD4, SMARCA4, STK11, TP53, TSC1, TSC2, VHL).  3. HTN -she is on HCTZ and lisinopril , we'll continue, she will follow-up with her primary care physician  -We discussed the impact of chemotherapy, blood pressure, and a possibility that we may hold her blood pressure medication if she gets dehydrated.   4. Rosacea -Dermatologist prescribed topical steroid -OK to use during chemotherapy; she will also receive IV steroid which may help control  5. Skin wound at [facility] site and folliculitis in  front upper chest, resolved -drained and cultured on 06/05/17  -patient afebrile without signs of systemic infection, but is immune compromised while undergoing chemotherapy -Given bactrim DS BID x10 days for MRSA coverage prescribed 06/05/17 -Resolved now  6. Hypokalemia -She is on Kdur 20 mEQ daily  -Labs reviewed today (10/09/17) her K is 3.5 and I want her to continue to keep taking the K supplement for now. May stop in the future   PLAN:  -Labs reviewed and adequate to proceed with last dose taxol today  -F/u in 3 weeks -I will discuss her case at tumor board after breast MRI   No orders of the defined types were placed in this encounter.  All questions were answered. The patient knows to call the clinic with any problems, questions or concerns. I spent 20 minutes counseling the patient face to face. The total time spent in the appointment was 25 minutes and more than 50% was on counseling.  This document serves as a record of services personally performed by [name], MD. It was created on her behalf by Theresia Bough, a trained medical scribe. The creation of this record is based on the scribe's personal observations and the provider's statements to them.   I have reviewed the above documentation for accuracy and completeness, and I agree with the above.     [name], MD 10/09/2017 4:34 PM

## 2017-10-09 ENCOUNTER — Inpatient Hospital Stay: Payer: Federal, State, Local not specified - PPO | Attending: Hematology

## 2017-10-09 ENCOUNTER — Inpatient Hospital Stay: Payer: Federal, State, Local not specified - PPO

## 2017-10-09 ENCOUNTER — Inpatient Hospital Stay (HOSPITAL_BASED_OUTPATIENT_CLINIC_OR_DEPARTMENT_OTHER): Payer: Federal, State, Local not specified - PPO | Admitting: Hematology

## 2017-10-09 VITALS — BP 141/92 | HR 100 | Temp 98.3°F | Resp 18 | Ht 66.0 in | Wt 211.7 lb

## 2017-10-09 DIAGNOSIS — E876 Hypokalemia: Secondary | ICD-10-CM | POA: Insufficient documentation

## 2017-10-09 DIAGNOSIS — Z5111 Encounter for antineoplastic chemotherapy: Secondary | ICD-10-CM | POA: Insufficient documentation

## 2017-10-09 DIAGNOSIS — Z9221 Personal history of antineoplastic chemotherapy: Secondary | ICD-10-CM | POA: Diagnosis not present

## 2017-10-09 DIAGNOSIS — Z171 Estrogen receptor negative status [ER-]: Secondary | ICD-10-CM | POA: Diagnosis not present

## 2017-10-09 DIAGNOSIS — M1990 Unspecified osteoarthritis, unspecified site: Secondary | ICD-10-CM | POA: Diagnosis not present

## 2017-10-09 DIAGNOSIS — C50512 Malignant neoplasm of lower-outer quadrant of left female breast: Secondary | ICD-10-CM | POA: Diagnosis not present

## 2017-10-09 DIAGNOSIS — Z7982 Long term (current) use of aspirin: Secondary | ICD-10-CM | POA: Insufficient documentation

## 2017-10-09 DIAGNOSIS — L739 Follicular disorder, unspecified: Secondary | ICD-10-CM | POA: Diagnosis not present

## 2017-10-09 DIAGNOSIS — Z79899 Other long term (current) drug therapy: Secondary | ICD-10-CM

## 2017-10-09 DIAGNOSIS — R911 Solitary pulmonary nodule: Secondary | ICD-10-CM | POA: Insufficient documentation

## 2017-10-09 DIAGNOSIS — Z95828 Presence of other vascular implants and grafts: Secondary | ICD-10-CM

## 2017-10-09 DIAGNOSIS — L719 Rosacea, unspecified: Secondary | ICD-10-CM | POA: Diagnosis not present

## 2017-10-09 DIAGNOSIS — D259 Leiomyoma of uterus, unspecified: Secondary | ICD-10-CM | POA: Diagnosis not present

## 2017-10-09 DIAGNOSIS — C773 Secondary and unspecified malignant neoplasm of axilla and upper limb lymph nodes: Secondary | ICD-10-CM

## 2017-10-09 DIAGNOSIS — L609 Nail disorder, unspecified: Secondary | ICD-10-CM | POA: Diagnosis not present

## 2017-10-09 DIAGNOSIS — I10 Essential (primary) hypertension: Secondary | ICD-10-CM

## 2017-10-09 DIAGNOSIS — G629 Polyneuropathy, unspecified: Secondary | ICD-10-CM | POA: Diagnosis not present

## 2017-10-09 DIAGNOSIS — R5383 Other fatigue: Secondary | ICD-10-CM | POA: Diagnosis not present

## 2017-10-09 DIAGNOSIS — Z87891 Personal history of nicotine dependence: Secondary | ICD-10-CM

## 2017-10-09 DIAGNOSIS — K219 Gastro-esophageal reflux disease without esophagitis: Secondary | ICD-10-CM | POA: Insufficient documentation

## 2017-10-09 DIAGNOSIS — Z803 Family history of malignant neoplasm of breast: Secondary | ICD-10-CM | POA: Insufficient documentation

## 2017-10-09 LAB — CBC WITH DIFFERENTIAL/PLATELET
Basophils Absolute: 0.1 10*3/uL (ref 0.0–0.1)
Basophils Relative: 1 %
EOS ABS: 0.1 10*3/uL (ref 0.0–0.5)
Eosinophils Relative: 1 %
HCT: 33.2 % — ABNORMAL LOW (ref 34.8–46.6)
HEMOGLOBIN: 11.1 g/dL — AB (ref 11.6–15.9)
LYMPHS PCT: 9 %
Lymphs Abs: 0.7 10*3/uL — ABNORMAL LOW (ref 0.9–3.3)
MCH: 29.3 pg (ref 25.1–34.0)
MCHC: 33.5 g/dL (ref 31.5–36.0)
MCV: 87.2 fL (ref 79.5–101.0)
Monocytes Absolute: 0.8 10*3/uL (ref 0.1–0.9)
Monocytes Relative: 11 %
NEUTROS PCT: 78 %
Neutro Abs: 5.6 10*3/uL (ref 1.5–6.5)
Platelets: 451 10*3/uL — ABNORMAL HIGH (ref 145–400)
RBC: 3.8 MIL/uL (ref 3.70–5.45)
RDW: 14.8 % — ABNORMAL HIGH (ref 11.2–14.5)
WBC: 7.2 10*3/uL (ref 3.9–10.3)

## 2017-10-09 LAB — COMPREHENSIVE METABOLIC PANEL
ALT: 20 U/L (ref 0–55)
AST: 18 U/L (ref 5–34)
Albumin: 3.6 g/dL (ref 3.5–5.0)
Alkaline Phosphatase: 68 U/L (ref 40–150)
Anion gap: 12 — ABNORMAL HIGH (ref 3–11)
BUN: 8 mg/dL (ref 7–26)
CO2: 26 mmol/L (ref 22–29)
Calcium: 9.6 mg/dL (ref 8.4–10.4)
Chloride: 101 mmol/L (ref 98–109)
Creatinine, Ser: 0.76 mg/dL (ref 0.60–1.10)
Glucose, Bld: 157 mg/dL — ABNORMAL HIGH (ref 70–140)
POTASSIUM: 3.5 mmol/L (ref 3.5–5.1)
Sodium: 139 mmol/L (ref 136–145)
Total Bilirubin: 0.6 mg/dL (ref 0.2–1.2)
Total Protein: 7.4 g/dL (ref 6.4–8.3)

## 2017-10-09 MED ORDER — SODIUM CHLORIDE 0.9% FLUSH
10.0000 mL | INTRAVENOUS | Status: DC | PRN
  Administered 2017-10-09: 10 mL via INTRAVENOUS
  Filled 2017-10-09: qty 10

## 2017-10-09 MED ORDER — SODIUM CHLORIDE 0.9 % IV SOLN
80.0000 mg/m2 | Freq: Once | INTRAVENOUS | Status: AC
  Administered 2017-10-09: 168 mg via INTRAVENOUS
  Filled 2017-10-09: qty 28

## 2017-10-09 MED ORDER — DEXAMETHASONE SODIUM PHOSPHATE 10 MG/ML IJ SOLN
INTRAMUSCULAR | Status: AC
  Filled 2017-10-09: qty 1

## 2017-10-09 MED ORDER — SODIUM CHLORIDE 0.9 % IV SOLN
Freq: Once | INTRAVENOUS | Status: AC
  Administered 2017-10-09: 15:00:00 via INTRAVENOUS

## 2017-10-09 MED ORDER — FAMOTIDINE IN NACL 20-0.9 MG/50ML-% IV SOLN
INTRAVENOUS | Status: AC
  Filled 2017-10-09: qty 50

## 2017-10-09 MED ORDER — DIPHENHYDRAMINE HCL 50 MG/ML IJ SOLN
INTRAMUSCULAR | Status: AC
  Filled 2017-10-09: qty 1

## 2017-10-09 MED ORDER — FAMOTIDINE IN NACL 20-0.9 MG/50ML-% IV SOLN
20.0000 mg | Freq: Once | INTRAVENOUS | Status: AC
  Administered 2017-10-09: 20 mg via INTRAVENOUS

## 2017-10-09 MED ORDER — DEXAMETHASONE SODIUM PHOSPHATE 10 MG/ML IJ SOLN
10.0000 mg | Freq: Once | INTRAMUSCULAR | Status: AC
  Administered 2017-10-09: 10 mg via INTRAVENOUS

## 2017-10-09 MED ORDER — DIPHENHYDRAMINE HCL 50 MG/ML IJ SOLN
25.0000 mg | Freq: Once | INTRAMUSCULAR | Status: AC
  Administered 2017-10-09: 25 mg via INTRAVENOUS

## 2017-10-09 MED ORDER — HEPARIN SOD (PORK) LOCK FLUSH 100 UNIT/ML IV SOLN
500.0000 [IU] | Freq: Once | INTRAVENOUS | Status: AC | PRN
  Administered 2017-10-09: 500 [IU]
  Filled 2017-10-09: qty 5

## 2017-10-09 MED ORDER — SODIUM CHLORIDE 0.9% FLUSH
10.0000 mL | INTRAVENOUS | Status: DC | PRN
  Administered 2017-10-09: 10 mL
  Filled 2017-10-09: qty 10

## 2017-10-09 NOTE — Patient Instructions (Signed)
[facility] Discharge Instructions for Patients Receiving Chemotherapy  Today you received the following chemotherapy agents:  Taxol.  To help prevent nausea and vomiting after your treatment, we encourage you to take your nausea medication as directed.   If you develop nausea and vomiting that is not controlled by your nausea medication, call the clinic.   BELOW ARE SYMPTOMS THAT SHOULD BE REPORTED IMMEDIATELY:  *FEVER GREATER THAN 100.5 F  *CHILLS WITH OR WITHOUT FEVER  NAUSEA AND VOMITING THAT IS NOT CONTROLLED WITH YOUR NAUSEA MEDICATION  *UNUSUAL SHORTNESS OF BREATH  *UNUSUAL BRUISING OR BLEEDING  TENDERNESS IN MOUTH AND THROAT WITH OR WITHOUT PRESENCE OF ULCERS  *URINARY PROBLEMS  *BOWEL PROBLEMS  UNUSUAL RASH Items with * indicate a potential emergency and should be followed up as soon as possible.  Feel free to call the clinic should you have any questions or concerns. The clinic phone number is [phone].  Please show the CHEMO ALERT CARD at check-in to the Emergency Department and triage nurse.   

## 2017-10-10 ENCOUNTER — Encounter: Payer: Self-pay | Admitting: Hematology

## 2017-10-14 ENCOUNTER — Ambulatory Visit
Admission: RE | Admit: 2017-10-14 | Discharge: 2017-10-14 | Disposition: A | Discharge status: Home / Self Care | Payer: Federal, State, Local not specified - PPO | Source: Ambulatory Visit | Attending: Hematology | Admitting: Hematology

## 2017-10-14 DIAGNOSIS — Z171 Estrogen receptor negative status [ER-]: Principal | ICD-10-CM

## 2017-10-14 DIAGNOSIS — C50512 Malignant neoplasm of lower-outer quadrant of left female breast: Secondary | ICD-10-CM

## 2017-10-14 DIAGNOSIS — C50919 Malignant neoplasm of unspecified site of unspecified female breast: Secondary | ICD-10-CM | POA: Diagnosis not present

## 2017-10-14 MED ORDER — GADOBENATE DIMEGLUMINE 529 MG/ML IV SOLN
20.0000 mL | Freq: Once | INTRAVENOUS | Status: AC | PRN
  Administered 2017-10-14: 20 mL via INTRAVENOUS

## 2017-10-16 ENCOUNTER — Other Ambulatory Visit: Payer: Self-pay | Admitting: General Surgery

## 2017-10-16 DIAGNOSIS — C50512 Malignant neoplasm of lower-outer quadrant of left female breast: Secondary | ICD-10-CM | POA: Diagnosis not present

## 2017-10-22 ENCOUNTER — Telehealth: Payer: Self-pay | Admitting: *Deleted

## 2017-10-22 NOTE — Telephone Encounter (Signed)
Pt had called earlier & left message regarding above.  Message left for pt to return call.  Pt reached later & doesn't know if contact was viral or bacterial.  Informed Dr Burr Medico & pt should watch for any signs such as h/a, fever & let us know but nothing to do for now.  Hopefully counts are back up to normal.  She will let us know if it was viral. Informed to see Dr Burr Medico next week & message sent to scheduler.

## 2017-10-22 NOTE — Telephone Encounter (Signed)
"  Should I be concerned or do anything about exposure to someone with meningitis?  Yesterday I visited a friend for fifteen minutes.  She cares for her mother who was transferred by EMS to hospital.  Her mom passed by me on stretcher to ambulance.  Today I learned mom has meningitis.  Do not know if viral or bacterial.  I just finished chemotherapy on 10-09-2017.  Her mom did not cough, sneeze or kiss me."   Call transferred to collaborative.    Message left perchance patient reached voicemail and no message left.    Received no information in reference to vaccinations with this call.

## 2017-10-23 ENCOUNTER — Telehealth: Payer: Self-pay | Admitting: Hematology

## 2017-10-23 NOTE — Telephone Encounter (Signed)
Spoke with patient and confirmed appt per 2/15 sch msg

## 2017-10-23 NOTE — Telephone Encounter (Signed)
Scheduled appt per 2/15 sch msg - left voicemail with appts that were added.

## 2017-10-26 DIAGNOSIS — C50512 Malignant neoplasm of lower-outer quadrant of left female breast: Secondary | ICD-10-CM | POA: Diagnosis not present

## 2017-10-26 DIAGNOSIS — Z171 Estrogen receptor negative status [ER-]: Secondary | ICD-10-CM | POA: Diagnosis not present

## 2017-10-29 ENCOUNTER — Ambulatory Visit: Payer: Federal, State, Local not specified - PPO | Admitting: Hematology

## 2017-10-29 ENCOUNTER — Encounter: Payer: Self-pay | Admitting: Nurse Practitioner

## 2017-10-29 ENCOUNTER — Inpatient Hospital Stay (HOSPITAL_BASED_OUTPATIENT_CLINIC_OR_DEPARTMENT_OTHER): Payer: Federal, State, Local not specified - PPO | Admitting: Nurse Practitioner

## 2017-10-29 ENCOUNTER — Inpatient Hospital Stay: Payer: Federal, State, Local not specified - PPO

## 2017-10-29 ENCOUNTER — Other Ambulatory Visit: Payer: Federal, State, Local not specified - PPO

## 2017-10-29 VITALS — BP 135/82 | HR 86 | Temp 98.6°F | Resp 18 | Ht 66.0 in | Wt 210.2 lb

## 2017-10-29 DIAGNOSIS — R5383 Other fatigue: Secondary | ICD-10-CM | POA: Diagnosis not present

## 2017-10-29 DIAGNOSIS — E559 Vitamin D deficiency, unspecified: Secondary | ICD-10-CM | POA: Diagnosis not present

## 2017-10-29 DIAGNOSIS — L739 Follicular disorder, unspecified: Secondary | ICD-10-CM | POA: Diagnosis not present

## 2017-10-29 DIAGNOSIS — L609 Nail disorder, unspecified: Secondary | ICD-10-CM

## 2017-10-29 DIAGNOSIS — C773 Secondary and unspecified malignant neoplasm of axilla and upper limb lymph nodes: Secondary | ICD-10-CM | POA: Diagnosis not present

## 2017-10-29 DIAGNOSIS — Z9221 Personal history of antineoplastic chemotherapy: Secondary | ICD-10-CM | POA: Diagnosis not present

## 2017-10-29 DIAGNOSIS — I10 Essential (primary) hypertension: Secondary | ICD-10-CM

## 2017-10-29 DIAGNOSIS — Z79899 Other long term (current) drug therapy: Secondary | ICD-10-CM

## 2017-10-29 DIAGNOSIS — C50512 Malignant neoplasm of lower-outer quadrant of left female breast: Secondary | ICD-10-CM

## 2017-10-29 DIAGNOSIS — L719 Rosacea, unspecified: Secondary | ICD-10-CM | POA: Diagnosis not present

## 2017-10-29 DIAGNOSIS — G629 Polyneuropathy, unspecified: Secondary | ICD-10-CM | POA: Diagnosis not present

## 2017-10-29 DIAGNOSIS — M1990 Unspecified osteoarthritis, unspecified site: Secondary | ICD-10-CM | POA: Diagnosis not present

## 2017-10-29 DIAGNOSIS — K219 Gastro-esophageal reflux disease without esophagitis: Secondary | ICD-10-CM | POA: Diagnosis not present

## 2017-10-29 DIAGNOSIS — E876 Hypokalemia: Secondary | ICD-10-CM

## 2017-10-29 DIAGNOSIS — E782 Mixed hyperlipidemia: Secondary | ICD-10-CM | POA: Diagnosis not present

## 2017-10-29 DIAGNOSIS — D509 Iron deficiency anemia, unspecified: Secondary | ICD-10-CM | POA: Diagnosis not present

## 2017-10-29 DIAGNOSIS — Z87891 Personal history of nicotine dependence: Secondary | ICD-10-CM

## 2017-10-29 DIAGNOSIS — Z171 Estrogen receptor negative status [ER-]: Secondary | ICD-10-CM

## 2017-10-29 DIAGNOSIS — D259 Leiomyoma of uterus, unspecified: Secondary | ICD-10-CM | POA: Diagnosis not present

## 2017-10-29 DIAGNOSIS — Z7982 Long term (current) use of aspirin: Secondary | ICD-10-CM

## 2017-10-29 DIAGNOSIS — R911 Solitary pulmonary nodule: Secondary | ICD-10-CM | POA: Diagnosis not present

## 2017-10-29 DIAGNOSIS — Z5111 Encounter for antineoplastic chemotherapy: Secondary | ICD-10-CM | POA: Diagnosis not present

## 2017-10-29 DIAGNOSIS — Z803 Family history of malignant neoplasm of breast: Secondary | ICD-10-CM

## 2017-10-29 LAB — COMPREHENSIVE METABOLIC PANEL
ALBUMIN: 3.6 g/dL (ref 3.5–5.0)
ALT: 31 U/L (ref 0–55)
AST: 27 U/L (ref 5–34)
Alkaline Phosphatase: 67 U/L (ref 40–150)
Anion gap: 9 (ref 3–11)
BILIRUBIN TOTAL: 0.6 mg/dL (ref 0.2–1.2)
BUN: 6 mg/dL — AB (ref 7–26)
CO2: 30 mmol/L — ABNORMAL HIGH (ref 22–29)
CREATININE: 0.72 mg/dL (ref 0.60–1.10)
Calcium: 10.1 mg/dL (ref 8.4–10.4)
Chloride: 103 mmol/L (ref 98–109)
GFR calc Af Amer: >60 mL/min (ref >60)
GFR calc non Af Amer: >60 mL/min (ref >60)
GLUCOSE: 129 mg/dL (ref 70–140)
POTASSIUM: 4 mmol/L (ref 3.5–5.1)
Sodium: 142 mmol/L (ref 136–145)
TOTAL PROTEIN: 7.4 g/dL (ref 6.4–8.3)

## 2017-10-29 LAB — CBC WITH DIFFERENTIAL/PLATELET
BASOS ABS: 0 10*3/uL (ref 0.0–0.1)
Basophils Relative: 0 %
EOS ABS: 0.1 10*3/uL (ref 0.0–0.5)
EOS PCT: 2 %
HCT: 35.9 % (ref 34.8–46.6)
Hemoglobin: 11.6 g/dL (ref 11.6–15.9)
LYMPHS ABS: 1.2 10*3/uL (ref 0.9–3.3)
LYMPHS PCT: 17 %
MCH: 28.5 pg (ref 25.1–34.0)
MCHC: 32.3 g/dL (ref 31.5–36.0)
MCV: 88.2 fL (ref 79.5–101.0)
MONO ABS: 0.7 10*3/uL (ref 0.1–0.9)
Monocytes Relative: 10 %
Neutro Abs: 5 10*3/uL (ref 1.5–6.5)
Neutrophils Relative %: 71 %
PLATELETS: 370 10*3/uL (ref 145–400)
RBC: 4.07 MIL/uL (ref 3.70–5.45)
RDW: 13.5 % (ref 11.2–14.5)
WBC: 7 10*3/uL (ref 3.9–10.3)

## 2017-10-29 NOTE — Progress Notes (Addendum)
[facility] [facility]  [phone] [phone]  Clinic Follow up Note   Patient Care Team: [name], MD as PCP - General (Family Medicine) [name], MD as Consulting Physician (Hematology) [name], MD as Consulting Physician (General Surgery) [name], MD as Consulting Physician (Radiation Oncology)  Date of Service: 10/29/17  SUMMARY OF ONCOLOGIC HISTORY: Oncology History   Cancer Staging Malignant neoplasm of lower-outer quadrant of left breast of female, estrogen receptor negative ([place]) Staging form: Breast, AJCC 8th Edition - Clinical: Stage IIB (cT2, cN0(f), cM0, G3, ER: Negative, PR: Negative, HER2: Negative) - Unsigned       Malignant neoplasm of lower-outer quadrant of left breast of female, estrogen receptor negative ([place])   04/27/2017 Mammogram    IMPRESSION: 1. Highly suspicious mass within the LEFT breast at the 5:30 o'clock axis, 4 cm from the nipple, measuring 2.5 cm, corresponding to the mammographic finding and corresponding to 1 of the palpable lumps in the left breast. Ultrasound-guided biopsy is recommended. 2. Mildly prominent lymph node in the LEFT axilla, with normal cortical thickness but with questionable effacement of the fatty hilum. Ultrasound-guided core biopsy is recommended. 3. No evidence of malignancy within the RIGHT breast. Also, no evidence of malignancy within the second area of clinical concern in the upper outer left breast.      04/29/2017 Initial Diagnosis    Malignant neoplasm of lower-outer quadrant of left breast of female, estrogen receptor negative ([place])      04/29/2017 Receptors her2    Estrogen Receptor: 0%, NEGATIVE Progesterone Receptor: 0%, NEGATIVE Proliferation Marker Ki67: 90% HER2 NEGATIVE       04/29/2017 Initial Biopsy     Diagnosis 1. 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 - SEE COMMENT. 2.  Lymph node, needle/core biopsy, left axilla - THERE IN NO EVIDENCE OF CARCINOMA IN 1 OF 1 LYMPH NODE (0/1).      05/14/2017 Imaging    MRI of Breast Bilateral 05/14/17  IMPRESSION: 1. 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 2. Non mass enhancement extending approximately 3.2 cm anterior to the mass in the lower inner quadrant of the left breast, suspicious for DCIS. There is no correlate on recent mammography. The overall extent of the mass and non mass enhancement is approximately 5 cm. 3. No MRI evidence of malignancy involving the right breast. 4. No pathologic lymphadenopathy. Upper normal sized left axillary lymph nodes, the largest of which was previously biopsied no evidence of metastatic disease.      05/15/2017 Imaging    Bone scan whole body 05/15/17 IMPRESSION: No findings specific for metastatic disease to bone.      05/15/2017 Imaging    CT CAP W contrast 05/15/17  IMPRESSION: 1. 18 mm soft tissue lesion inferior left breast compatible with known primary malignancy. 2. Mildly enlarged left axillary lymph nodes in this patient with biopsy-proven metastatic disease to the left axilla. 3. No evidence for lymphadenopathy elsewhere in the chest. No lymphadenopathy in the abdomen or pelvis. No evidence for metastatic disease in the abdomen or pelvis. 4. Tiny right perifissural lung nodule most likely subpleural lymph node. Attention on follow-up recommended. 5. 7 mm hypoattenuating lesion in the spleen, likely a cyst or pseudocyst. Attention on follow-up recommended. 6. Uterine fibroids. 7.  Aortic Atherosclerois (ICD10-170.0)      05/19/2017 Procedure  Port placement by Dr. [name] on 9/11/8      05/22/2017 - 10/09/2017 Chemotherapy     neoadjuvant chemotherpy AC every 2 weeks for 4 cycles starting 05/22/17 and completed on 07/07/17, followed by weekly paclitaxel for  12 cycles starting 07/24/17 to complete on 10/09/17       05/22/2017 Pathology Results    Diagnosis Breast, left, needle core biopsy, lower inner quad - DUCTAL CARCINOMA IN SITU. 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  The genes analyzed were the 46 genes on [facility]'s Common Cancers panel (APC, ATM, AXIN2, BARD1, BMPR1A, BRCA1, BRCA2, BRIP1, CDH1, CDKN2A, CHEK2, CTNNA1, DICER1, EPCAM, GREM1, HOXB13, KIT, MEN1, MLH1, MSH2, MSH3, MSH6, MUTYH, NBN, NF1, NTHL1, PALB2, PDGFRA, PMS2, POLD1, POLE, PTEN, RAD50, RAD51C, RAD51D, SDHA, SDHB, SDHC, SDHD, SMAD4, SMARCA4, STK11, TP53, TSC1, TSC2, VHL).        08/14/2017 Echocardiogram    EF 60-65%  Study Conclusions  - Left ventricle: The cavity size was normal. Systolic function was   normal. The estimated ejection fraction was in the range of 60%   to 65%. Wall motion was normal; there were no regional wall   motion abnormalities. Doppler parameters are consistent with   abnormal left ventricular relaxation (grade 1 diastolic   dysfunction). - Pulmonary arteries: PA peak pressure: 31 mm Hg (S).  Impressions:  - No change from September 2018 study (strain rate not performed on   that study).         10/14/2017 Imaging    MRI Breast Bilateral  IMPRESSION:  Significant improvement following neoadjuvant treatment.  No residual enhancement in the left breast at sites of known malignancy.  RECOMMENDATION: Treatment plan for known malignancy.     CURRENT THERAPY: neoadjuvant chemotherapy AC every 2 weeks for 4 cycles starting 05/22/17 and completed on 07/07/17, followed by weekly paclitaxel for 12 cycles starting 07/24/17 to complete 10/09/17. PENDING definitive breast surgery   INTERVAL HISTORY: Ms. Comes returns for follow up as scheduled following breast MRI and consult with Dr. [name]. She is leaning  toward mastectomy, she wants to avoid radiation. She is in the process of meeting with plastic surgery. She completed chemo on 10/09/17. She has stable mild fatigue, able to work and carry out activities normally. Eating and drinking well. No fever, chills, cough, dyspnea, nausea, vomiting, constipation, diarrhea, or changes in her breasts. She has darkening to nails and worries left hand 3rd finger is infected, she felt the nail lift up recently. It is mildly red, tender, and swollen. She denies other specific complaints today.  REVIEW OF SYSTEMS:   Constitutional: Denies fevers, chills or abnormal weight loss (+) mild fatigue, stable Eyes: Denies blurriness of vision Ears, nose, mouth, throat, and face: Denies mucositis or sore throat Respiratory: Denies cough, dyspnea or wheezes Cardiovascular: Denies palpitation, chest discomfort or lower extremity swelling Gastrointestinal:  Denies nausea, vomiting, constipation, diarrhea, heartburn or change in bowel habits Skin: Denies abnormal skin rashes (+) nail darkening (+) left hand 3rd finger redness, swelling, and pain  Lymphatics: Denies new lymphadenopathy or easy bruising Neurological:Denies new weaknesses (+) mild intermittent neuropathy to fingertips  Behavioral/Psych: Mood is stable, no new changes  All other systems were reviewed with the patient and are negative.  MEDICAL HISTORY:  Past Medical History:  Diagnosis Date  . Anemia   . Breast cancer ([place])   . Cancer ([place])  left breast cancer  . Family history of breast cancer   . Genetic testing 05/28/2017   Common Cancers panel (46 genes) @ [facility] - No pathogenic mutations detected  . GERD (gastroesophageal reflux disease)   . Headache    Migraines  . Hypertension     SURGICAL HISTORY: Past Surgical History:  Procedure Laterality Date  . BREAST SURGERY     biopsy  . CESAREAN SECTION    . CHOLECYSTECTOMY N/A 02/07/2013   Procedure: LAPAROSCOPIC CHOLECYSTECTOMY WITH  INTRAOPERATIVE CHOLANGIOGRAM;  Surgeon: Gwenyth Ober, MD;  Location: [facility];  Service: General;  Laterality: N/A;  . COLONOSCOPY W/ BIOPSIES AND POLYPECTOMY    . FOOT SURGERY  2003   lt foot bunionectomy  . MULTIPLE TOOTH EXTRACTIONS    . PORTACATH PLACEMENT Right 05/19/2017   Procedure: INSERTION PORT-A-CATH;  Surgeon: [name], MD;  Location: [facility];  Service: General;  Laterality: Right;    I have reviewed the social history and family history with the patient and they are unchanged from previous note.  ALLERGIES:  has No Known Allergies.  MEDICATIONS:  Current Outpatient Medications  Medication Sig Dispense Refill  . aspirin-acetaminophen-caffeine (EXCEDRIN MIGRAINE) 250-250-65 MG tablet Take 2 tablets by mouth 2 (two) times daily as needed for headache.    . Cholecalciferol (VITAMIN D) 2000 units CAPS Take 2,000 Units by mouth daily.    . ciprofloxacin (CILOXAN) 0.3 % ophthalmic solution     . ferrous sulfate 325 (65 FE) MG tablet Take by mouth.    . fluocinonide ointment (LIDEX) 9.93 % Apply 1 application topically daily as needed (rosacea).    . hydrochlorothiazide (HYDRODIURIL) 25 MG tablet Take 25 mg by mouth daily.    Marland Kitchen lisinopril (PRINIVIL,ZESTRIL) 10 MG tablet Take 10 mgs by mouth daily  2  . potassium chloride SA (K-DUR,KLOR-CON) 20 MEQ tablet Take 1 tablet (20 mEq total) by mouth daily. 30 tablet 1  . ondansetron (ZOFRAN) 8 MG tablet Take 8 mg by mouth every 8 (eight) hours as needed for nausea or vomiting.    . potassium bicarbonate (K-LYTE) 25 MEQ disintegrating tablet Take by mouth.     No current facility-administered medications for this visit.    Facility-Administered Medications Ordered in Other Visits  Medication Dose Route Frequency Provider Last Rate Last Dose  . sodium chloride flush (NS) 0.9 % injection 10 mL  10 mL Intravenous PRN [name], MD   10 mL at 08/07/17 1354    PHYSICAL EXAMINATION: ECOG PERFORMANCE STATUS: 1 -  Symptomatic but completely ambulatory  Vitals:   10/29/17 1313  BP: 135/82  Pulse: 86  Resp: 18  Temp: 98.6 F (37 C)  SpO2: 100%   Filed Weights   10/29/17 1313  Weight: 210 lb 3.2 oz (95.3 kg)    GENERAL:alert, no distress and comfortable SKIN: skin color, texture, turgor are normal, no rashes or significant lesions EYES: normal, Conjunctiva are pink and non-injected, sclera clear OROPHARYNX:no exudate, no erythema and lips, buccal mucosa, and tongue normal  NECK: supple, thyroid normal size, non-tender, without nodularity LYMPH:  no palpable cervical, supraclavicular, axillary lymphadenopathy  LUNGS: clear to auscultation bilaterally with normal breathing effort HEART: regular rate & rhythm and no murmurs and no lower extremity edema ABDOMEN:abdomen soft, non-tender and normal bowel sounds. No hepatomegaly  Musculoskeletal:no cyanosis of digits and no clubbing (+) nail hyperpigmentation (+) left hand 3rd finger with mild erythema, edema, and tenderness to nailbed  NEURO: alert & oriented x 3 with  fluent speech, no focal motor/sensory deficits  BREASTS: inspection shows them to be symmetrical without nipple discharge. No palpable mass in breasts or axilla that I could appreciate.  PAC without erythema   LABORATORY DATA:  I have reviewed the data as listed CBC Latest Ref Rng & Units 10/29/2017 10/09/2017 10/02/2017  WBC 3.9 - 10.3 K/uL 7.0 7.2 7.1  Hemoglobin 11.6 - 15.9 g/dL 11.6 11.1(L) 11.3(L)  Hematocrit 34.8 - 46.6 % 35.9 33.2(L) 33.7(L)  Platelets 145 - 400 K/uL 370 451(H) 430(H)     CMP Latest Ref Rng & Units 10/29/2017 10/09/2017 10/02/2017  Glucose 70 - 140 mg/dL 129 157(H) 141(H)  BUN 7 - 26 mg/dL 6(L) 8 11  Creatinine 0.60 - 1.10 mg/dL 0.72 0.76 0.76  Sodium 136 - 145 mmol/L 142 139 137  Potassium 3.5 - 5.1 mmol/L 4.0 3.5 3.6  Chloride 98 - 109 mmol/L 103 101 100  CO2 22 - 29 mmol/L 30(H) 26 27  Calcium 8.4 - 10.4 mg/dL 10.1 9.6 9.8  Total Protein 6.4 - 8.3 g/dL  7.4 7.4 7.7  Total Bilirubin 0.2 - 1.2 mg/dL 0.6 0.6 0.7  Alkaline Phos 40 - 150 U/L 67 68 71  AST 5 - 34 U/L _0 ALT 0 - 55 U/L _1 RADIOGRAPHIC STUDIES: I have personally reviewed the radiological images as listed and agreed with the findings in the report. No results found.   ASSESSMENT & PLAN: Briana Price is a 52 y.o. african-american female with a history of HTN and arthritis in her back, presented with a palpable left breast mass.  1. 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  -Briana Price appears stable. She has recovered well from neoadjuvant chemotherapy. She has nail toxicity, recommend she perform nail soaks with vinegar and water 2-3 x daily for 20 minutes each time. She has mild infection to her nailbed on left hand 3rd finger, does not appear to need to be drained. She will apply neosporin for now. If symptoms worsen or do not resolve, will order oral antibiotic. Otherwise she is doing well. VS, weight, and labs stable.  -We reviewed her post-treatment MRI result, which shows significant improvement following neoadjuvant treatment, no residual enhancement in the left breast at sites of known malignancy. Dr. Burr Medico discussed this good result but that her cancer is likely not completely gone. She will proceed with definitive surgery; she is leaning toward mastectomy, she wants to avoid radiation. Mastectomy is reasonable given her invasive ductal carcinoma with DCIS in the lower outer quadrant and DCIS in the lower inner quadrant. She is in the process of meeting with plastic surgery and has not set surgery date. We discussed she should have surgery within 4-6 weeks from completion of chemotherapy (on 10/09/17), given the aggressive nature of triple negative disease.  -Dr. Burr Medico discussed possible need for adjuvant chemotherapy pending final surgical  pathology and discussed possible enrollment in immunotherapy clinical trial. She will leave her PAC in for now and think about it.  -f/u in 4 weeks to review surgical path  2. Genetics  -Positive for Variant of UncertainSignificance: ATM, but negative for other 46 genes on [facility]'s Common Cancers panel   3. HTN -on lisinopril and HCTZ; BP 135/82 today. Will monitor closely   4. Rosacea -Controlled; f/u dermatology   5. Skin wound at [facility] site and folliculitis to anterior  upper chest  -treated with Bactrim DS BID x10 ays for MRSA coverage on 06/05/17; Resolved   6. Hypokalemia  -she has been on oral K 20 Meq daily. K is normal today, but given her HCTZ use, will continue for now and monitor closely.   PLAN: -Nail soaks and topical neospirin to nailbed for nail toxicity -Proceed to surgery with Dr. [name] pending plastic surgery consult -F/u in 4 weeks to discuss final surgical pathology   All questions were answered. The patient knows to call the clinic with any problems, questions or concerns. No barriers to learning was detected. I spent 25 minutes counseling the patient face to face. The total time spent in the appointment was 35 minutes  and more than 50% was on counseling and review of test results     Briana Feeling, NP 10/30/17    Addendum  I have seen the patient, examined her. I agree with the assessment and and plan and have edited the notes.   Ms Mellin has recovered well from chemotherapy, restaging breast MRI showed complete geographic response to neoadjuvant chemotherapy.  She has seen Dr. [name], and is leaning towards left mastectomy with reconstruction, she prefers not to have implant. She saw Dr. Marla Roe but would like to have a second opinion about reconstruction. We discussed that her surgery ideally should be done in 4-6 weeks after chemo (she completed on 2/1) without too much delay.   We discussed the role of adjuvant therapy, including postmastectomy  radiation, Xeloda, and the clinical trial of pembrolizumab if she has significant residual disease. Since she is going to have second reconstruction surgery, I recommend her to keep the port for now, in case she meets the criteria for the [facility] trial. She agrees.   Plan to see her back after surgery. All questions were answered.   [name]  10/29/2017

## 2017-10-30 ENCOUNTER — Telehealth: Payer: Self-pay

## 2017-10-30 ENCOUNTER — Encounter: Payer: Self-pay | Admitting: Nurse Practitioner

## 2017-10-30 ENCOUNTER — Telehealth: Payer: Self-pay | Admitting: Hematology

## 2017-10-30 NOTE — Telephone Encounter (Signed)
-----   Message from Hughie Closs, LPN sent at 0/05/2329  9:47 AM EST -----   ----- Message ----- From: Alla Feeling, NP Sent: 10/30/2017   9:15 AM To: Hughie Closs, LPN  [place],   Can you please call her and have her restart potassium, one 20 mEq tab daily as she was on before. I didn't realize she is on HCTZ when I told her yesterday she could stop K.   Thanks, Regan Rakers

## 2017-10-30 NOTE — Telephone Encounter (Signed)
LVM for pt to restart potassium 20 meq one a day as before.  Let pt know to center with questions or concerns or if any issues arise.  Left center number for call back.

## 2017-10-30 NOTE — Telephone Encounter (Signed)
Scheduled appt per 2/21 los - Sent reminder letter in the mail.

## 2017-11-02 ENCOUNTER — Telehealth: Payer: Self-pay | Admitting: *Deleted

## 2017-11-02 NOTE — Telephone Encounter (Signed)
Left vm on pt's mobile # for pt to call back tomorrow for information.

## 2017-11-04 ENCOUNTER — Telehealth: Payer: Self-pay | Admitting: Hematology

## 2017-11-04 NOTE — Telephone Encounter (Signed)
Faxed records to Dr. Maia Plan office. Dr. Maia Plan nurse will review records and if pt is excepted they will call pt with appt.

## 2017-11-05 ENCOUNTER — Telehealth: Payer: Self-pay | Admitting: Emergency Medicine

## 2017-11-05 NOTE — Telephone Encounter (Signed)
Left VM with pt regarding referral to [facility]. Awaiting return call from patient.

## 2017-11-06 ENCOUNTER — Telehealth: Payer: Self-pay | Admitting: Nurse Practitioner

## 2017-11-06 NOTE — Telephone Encounter (Signed)
I called patient to discuss plastic surgery consult. After further consideration, the patient wishes to stay local and wants to meet with Dr. Harlow Mares, she got a consultation apt on 3/18. I called the office to request sooner appt given the time sensitive nature of her situation. New apt on 11/09/17 at 8:45. Patient is aware.

## 2017-11-09 ENCOUNTER — Telehealth: Payer: Self-pay | Admitting: *Deleted

## 2017-11-09 DIAGNOSIS — Z171 Estrogen receptor negative status [ER-]: Secondary | ICD-10-CM | POA: Diagnosis not present

## 2017-11-09 DIAGNOSIS — C50512 Malignant neoplasm of lower-outer quadrant of left female breast: Secondary | ICD-10-CM | POA: Diagnosis not present

## 2017-11-09 MED ORDER — CEPHALEXIN 500 MG PO CAPS: 500.0000 mg | ORAL_CAPSULE | Freq: Three times a day (TID) | ORAL | 0 refills | Status: DC

## 2017-11-09 NOTE — Telephone Encounter (Signed)
Pt called and left message requesting antibiotics for infected and loose fingernails.   Stated she had soaked nails in vinegar and warm water for about a week as instructed at last office visit  with no relief.  Dr. Burr Medico notified. Spoke with pt and was informed her that md had sent in Keflex for her to start today.  Pt voiced understanding.

## 2017-11-09 NOTE — Addendum Note (Signed)
Addended by: [name] on: 11/09/2017 04:30 PM   Modules accepted: Orders

## 2017-11-09 NOTE — Telephone Encounter (Signed)
I called in Keflex for her today.   [name] MD

## 2017-11-11 ENCOUNTER — Telehealth: Payer: Self-pay | Admitting: *Deleted

## 2017-11-11 NOTE — Telephone Encounter (Signed)
Pt called requesting a call back from nurse.   Called pt back without answer.  Left message on voice mail asking pt to call nurse back.

## 2017-11-12 ENCOUNTER — Other Ambulatory Visit: Payer: Self-pay | Admitting: General Surgery

## 2017-11-12 ENCOUNTER — Telehealth: Payer: Self-pay | Admitting: *Deleted

## 2017-11-12 DIAGNOSIS — Z170 Estrogen receptor positive status [ER+]: Principal | ICD-10-CM

## 2017-11-12 DIAGNOSIS — C50312 Malignant neoplasm of lower-inner quadrant of left female breast: Secondary | ICD-10-CM

## 2017-11-12 NOTE — Telephone Encounter (Signed)
I called back and left a VM on her phone to encourage her to call back.   [name] MD

## 2017-11-12 NOTE — Telephone Encounter (Signed)
Pt called requesting a call back from nurse.  Spoke with pt, and was informed that pt needs to talk to either Regan Rakers, NP or  Dr. Burr Medico  URGENTLY about her meeting with surgeon Dr. [name] in Feb , and to surgeon's nurse yesterday about her disease progress.  Pt was told by the nurse that her cancer could recurrent due to pt is Triple Negative. Stated she has conflict with the information provided by Dr. Burr Medico and the surgeon.   Stated her last chemo was on  Oct 09, 2017.   Pt requested a call back from either provider today if possible.  Pt is very anxious. Pt's     Phone      [phone].

## 2017-11-13 ENCOUNTER — Encounter (HOSPITAL_BASED_OUTPATIENT_CLINIC_OR_DEPARTMENT_OTHER): Payer: Self-pay | Admitting: *Deleted

## 2017-11-13 ENCOUNTER — Other Ambulatory Visit: Payer: Self-pay

## 2017-11-16 ENCOUNTER — Telehealth: Payer: Self-pay | Admitting: *Deleted

## 2017-11-16 ENCOUNTER — Other Ambulatory Visit: Payer: Self-pay | Admitting: Hematology

## 2017-11-16 ENCOUNTER — Telehealth: Payer: Self-pay | Admitting: Hematology

## 2017-11-16 NOTE — Telephone Encounter (Signed)
Left message on voicemail for patient per 3/11 sch msg

## 2017-11-16 NOTE — Telephone Encounter (Signed)
Received vm call from pt stating that she is having surgery 11/19/17 with DR [name] & has appt 11/26/17 for 11/26/17 & needs to cancel & wants to know if Dr [name] will flush port.  Discussed with Dr Burr Medico & per her instructions will r/s her appts to 3/28 or 3/29 , lab & f/u. Message left for pt & scheduling message sent.

## 2017-11-18 DIAGNOSIS — C50512 Malignant neoplasm of lower-outer quadrant of left female breast: Secondary | ICD-10-CM | POA: Diagnosis not present

## 2017-11-18 NOTE — Progress Notes (Signed)
Patient given Ensure pre surgery with instruction to drink by 0400. Also gave pt a bottle of CHG scrub with instruction to shower night before surgery and morning of surgery, she voiced understanding.

## 2017-11-18 NOTE — H&P (Signed)
Briana Price Documented: 11/18/2017 4:41 PM Location: [facility] Patient #: [id number] DOB: 1965-12-01 Single / Language: Briana Price / Race: Black or African American Female   History of Present Illness [name] MD; 11/18/2017 5:39 PM) The patient is a 52 year old female who presents for a follow-up for Breast cancer. Pt is a 52 yo F referred by Dr. 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 She received dx mammogram and ultrasound showing 2.5 cm irregular hypoechoic mass at 5:30 as well as a mildly prominent lymph node. She had core needle biopsies of both. 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 The lymph node was negative for cancer on biopsy.   She had no other personal history of cancer. menarche was age 33. She is a G1P1 wtih child at age 52. She has had a colonoscopy wtih several polyps wtih Dr. Collene Mares and is on a 5 year follow up schedule for another colonoscopy.   She has a significant family history of cancer. Her maternal grandmother and maternal aunt had breast cancer. She has two paternal aunts who had breast cancer and a paternal uncle who had leukemia.   **Pt had breast MRI since she had triple negative disease. 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 Total size was 5 cm including invasive cancer and DCIS. Pt completed chemotherapy. She has decided on bilateral mastectomy with left sentinel lymph node biopsy and pursuing free flap reconstruction later. She is very against foreign body reconstruction.   MRI breast 10/14/17 COMPARISON: MRI 05/14/2017, mammogram and ultrasound 05/22/2017 and earlier  FINDINGS: Breast composition: c. Heterogeneous fibroglandular tissue.  Background parenchymal enhancement: Minimal  Right breast: The patient has a right-sided Port-A-Cath. No mass or abnormal  enhancement.  Left breast: Tissue marker clip is identified in the posterior inferior portion of the left breast consistent with location of breast malignancy. Tissue marker clip artifact is also identified in the lower inner quadrant of the left breast following MR guided core biopsy of DCIS in this location. There is no abnormal enhancement in the left breast.  Lymph nodes: No abnormal appearing lymph nodes.  Ancillary findings: None.  IMPRESSION: Significant improvement following neoadjuvant treatment.  No residual enhancement in the left breast at sites of known malignancy.  RECOMMENDATION: Treatment plan for known malignancy.  BI-RADS CATEGORY 6: Known biopsy-proven malignancy.     Pathology 04/29/17 Diagnosis 1. Breast, left, needle core biopsy, 5:30 o'clock, mass - INVASIVE DUCTAL CARCINOMA. - DUCTAL CARCINOMA IN SITU. - SEE COMMENT. 2. Lymph node, needle/core biopsy, left axilla - THERE IN NO EVIDENCE OF CARCINOMA IN 1 OF 1 LYMPH NODE (0/1). Microscopic Comment 1. The carcinoma appears grade 2-3. IMMUNOHISTOCHEMICAL AND MORPHOMETRIC ANALYSIS PERFORMED MANUALLY Estrogen Receptor: 0%, NEGATIVE Progesterone Receptor: 0%, NEGATIVE Proliferation Marker Ki67: 90% Results: HER2 - NEGATIVE RATIO OF HER2/CEP17 SIGNALS 1.11 AVERAGE HER2 COPY NUMBER PER CELL 2.10  CBC remarkable for Plts 435k, CMET for CO2 30.   Dx mammogram/us 04/27/2017 ACR Breast Density Category c: The breast tissue is heterogeneously dense, which may obscure small masses.  FINDINGS: There is an irregular mass within the inferior left breast, at posterior depth, measuring approximately 2 cm greatest dimension.  There are no additional masses, suspicious calcifications or secondary signs of malignancy within either breast. Specifically, there are no mammographic abnormalities within the outer breasts bilaterally corresponding to the additional areas of clinical concern.  Mammographic  images were processed with CAD.  LEFT breast: Targeted ultrasound is performed, showing an irregular hypoechoic mass in the left breast at the 5:30 o'clock axis, 4 cm from the nipple, measuring 2.5 x 1.6 x 2 cm, corresponding to the mammographic finding and corresponding to 1 of the sites of clinical concern.  Targeted ultrasound also performed of the outer left breast corresponding to the additional area of clinical concern, showing only normal fibroglandular tissues and fat lobules throughout.  RIGHT breast: Targeted ultrasound is performed, evaluating the outer right breast corresponding to the area of clinical concern, showing only normal fibroglandular tissues and fat lobules. No suspicious solid or cystic mass is identified.  LEFT axilla was evaluated with ultrasound showing a single mildly prominent lymph node, measuring 1.3 cm greatest dimension, with normal cortical thickness of 3.5 mm but with questionable effacement of the fatty hilum.  IMPRESSION: 1. Highly suspicious mass within the LEFT breast at the 5:30 o'clock axis, 4 cm from the nipple, measuring 2.5 cm, corresponding to the mammographic finding and corresponding to 1 of the palpable lumps in the left breast. Ultrasound-guided biopsy is recommended. 2. Mildly prominent lymph node in the LEFT axilla, with normal cortical thickness but with questionable effacement of the fatty hilum. Ultrasound-guided core biopsy is recommended. 3. No evidence of malignancy within the RIGHT breast. Also, no evidence of malignancy within the second area of clinical concern in the upper outer left breast.  RECOMMENDATION: 1. Ultrasound-guided biopsy of the left breast mass at the 5:30 o'clock axis. 2. Ultrasound-guided biopsy of the single mildly prominent lymph node in the left axilla. Ultrasound-guided biopsies are scheduled for August 22nd.    Allergies Levonne Spiller, CMA; 11/18/2017 4:42 PM) No Known Drug Allergies  [11/18/2017]: Allergies Reconciled   Medication History Levonne Spiller, CMA; 11/18/2017 4:42 PM) Atorvastatin Calcium (20MG Tablet, Oral) Active. Lisinopril (10MG Tablet, Oral) Active. Cephalexin (500MG Capsule, Oral) Active. Potassium Chloride Crys ER (20MEQ Tablet ER, Oral) Active. BC Headache (325-16-95MG Tablet, Oral) Active. Vitamin D (Cholecalciferol) (1000UNIT Capsule, Oral) Active. Ferro-Sequels (50MG Tablet ER, Oral) Active. HydroDiuril (25MG Tablet, Oral) Active. Mobic (15MG Tablet, Oral) Active. Roxicet (5-325MG Tablet, Oral) Active. Medications Reconciled    Review of Systems [name] MD; 11/18/2017 5:40 PM) All other systems negative  Vitals (Danielle Gerrigner CMA; 11/18/2017 4:42 PM) 11/18/2017 4:42 PM Weight: 213 lb Height: 67in Body Surface Area: 2.08 m Body Mass Index: 33.36 kg/m  Temp.: 98.24F(Oral)  Pulse: 95 (Regular)  BP: 130/98 (Sitting, Left Arm, Standard)       Physical Exam [name] MD; 11/18/2017 5:40 PM) The physical exam findings are as follows: Note:entire visit spent in counseling.    Assessment & Plan [name] MD; 11/18/2017 5:49 PM) PRIMARY CANCER OF LOWER OUTER QUADRANT OF LEFT FEMALE BREAST (C50.512) Impression: Patient has decided to go with bilateral mastectomy with left sentinel lymph node biopsy and no reconstruction upfront. This way she can make sure that she does not need radiation. She then would like to pursue the free flap reconstruction at [facility], or [place]. I discussed that I would try to leave a little extra skin for that and not make the flaps too tight. I reviewed the risks of surgery including bleeding, infection, pain, dissatisfaction with her scars, swelling, possible need for additional procedures or surgeries, drain placement, possible heart or lung complications, possible blood clot. The patient has her mom coming to visit her. Her mom will stay with her as long as she  needs assistance. She  understands and wishes to proceed. This entire visit was spent in counseling.  30 min spent in evaluation, examination, counseling, and coordination of care. >50% spent in counseling.    Signed by [name], MD (11/18/2017 5:50 PM)

## 2017-11-18 NOTE — Anesthesia Preprocedure Evaluation (Signed)
Anesthesia Evaluation  Patient identified by MRN, date of birth, ID band Patient awake    Reviewed: Allergy & Precautions, NPO status , Patient's Chart, lab work & pertinent test results  Airway Mallampati: II  TM Distance: >3 FB Neck ROM: Full    Dental   Pulmonary former smoker,    Pulmonary exam normal breath sounds clear to auscultation       Cardiovascular hypertension, Pt. on medications Normal cardiovascular exam Rhythm:Regular Rate:Normal     Neuro/Psych  Headaches, negative psych ROS   GI/Hepatic Neg liver ROS, GERD  Medicated and Controlled,  Endo/Other  Obesity Left Breast Ca  Renal/GU negative Renal ROS  negative genitourinary   Musculoskeletal negative musculoskeletal ROS (+)   Abdominal (+) + obese,   Peds  Hematology  (+) anemia ,   Anesthesia Other Findings   Reproductive/Obstetrics negative OB ROS                             Anesthesia Physical  Anesthesia Plan  ASA: II  Anesthesia Plan: General   Post-op Pain Management: GA combined w/ Regional for post-op pain   Induction: Intravenous  PONV Risk Score and Plan: 4 or greater and Ondansetron, Dexamethasone, Midazolam, Propofol infusion and Metaclopromide  Airway Management Planned: LMA  Additional Equipment:   Intra-op Plan:   Post-operative Plan: Extubation in OR  Informed Consent: I have reviewed the patients History and Physical, chart, labs and discussed the procedure including the risks, benefits and alternatives for the proposed anesthesia with the patient or authorized representative who has indicated his/her understanding and acceptance.   Dental advisory given  Plan Discussed with: CRNA  Anesthesia Plan Comments:         Anesthesia Quick Evaluation

## 2017-11-19 ENCOUNTER — Encounter (HOSPITAL_BASED_OUTPATIENT_CLINIC_OR_DEPARTMENT_OTHER)
Admission: RE | Disposition: A | Discharge status: Home / Self Care | Payer: Self-pay | Source: Ambulatory Visit | Attending: General Surgery

## 2017-11-19 ENCOUNTER — Ambulatory Visit (HOSPITAL_COMMUNITY)
Admission: RE | Admit: 2017-11-19 | Discharge: 2017-11-19 | Disposition: A | Discharge status: Home / Self Care | Payer: Federal, State, Local not specified - PPO | Source: Ambulatory Visit | Attending: General Surgery | Admitting: General Surgery

## 2017-11-19 ENCOUNTER — Other Ambulatory Visit: Payer: Self-pay

## 2017-11-19 ENCOUNTER — Encounter (HOSPITAL_BASED_OUTPATIENT_CLINIC_OR_DEPARTMENT_OTHER): Payer: Self-pay | Admitting: Anesthesiology

## 2017-11-19 ENCOUNTER — Ambulatory Visit (HOSPITAL_BASED_OUTPATIENT_CLINIC_OR_DEPARTMENT_OTHER)
Admission: RE | Admit: 2017-11-19 | Discharge: 2017-11-20 | Disposition: A | Discharge status: Home / Self Care | Payer: Federal, State, Local not specified - PPO | Source: Ambulatory Visit | Attending: General Surgery | Admitting: General Surgery

## 2017-11-19 ENCOUNTER — Ambulatory Visit (HOSPITAL_BASED_OUTPATIENT_CLINIC_OR_DEPARTMENT_OTHER): Payer: Federal, State, Local not specified - PPO | Admitting: Anesthesiology

## 2017-11-19 DIAGNOSIS — Z792 Long term (current) use of antibiotics: Secondary | ICD-10-CM | POA: Diagnosis not present

## 2017-11-19 DIAGNOSIS — L7632 Postprocedural hematoma of skin and subcutaneous tissue following other procedure: Secondary | ICD-10-CM | POA: Insufficient documentation

## 2017-11-19 DIAGNOSIS — C50312 Malignant neoplasm of lower-inner quadrant of left female breast: Secondary | ICD-10-CM

## 2017-11-19 DIAGNOSIS — D649 Anemia, unspecified: Secondary | ICD-10-CM | POA: Insufficient documentation

## 2017-11-19 DIAGNOSIS — Z87891 Personal history of nicotine dependence: Secondary | ICD-10-CM | POA: Diagnosis not present

## 2017-11-19 DIAGNOSIS — C50912 Malignant neoplasm of unspecified site of left female breast: Secondary | ICD-10-CM | POA: Diagnosis not present

## 2017-11-19 DIAGNOSIS — Z79899 Other long term (current) drug therapy: Secondary | ICD-10-CM | POA: Insufficient documentation

## 2017-11-19 DIAGNOSIS — Z171 Estrogen receptor negative status [ER-]: Secondary | ICD-10-CM | POA: Insufficient documentation

## 2017-11-19 DIAGNOSIS — C50412 Malignant neoplasm of upper-outer quadrant of left female breast: Secondary | ICD-10-CM | POA: Diagnosis not present

## 2017-11-19 DIAGNOSIS — I10 Essential (primary) hypertension: Secondary | ICD-10-CM | POA: Insufficient documentation

## 2017-11-19 DIAGNOSIS — E669 Obesity, unspecified: Secondary | ICD-10-CM | POA: Diagnosis not present

## 2017-11-19 DIAGNOSIS — L7622 Postprocedural hemorrhage and hematoma of skin and subcutaneous tissue following other procedure: Secondary | ICD-10-CM | POA: Diagnosis not present

## 2017-11-19 DIAGNOSIS — D241 Benign neoplasm of right breast: Secondary | ICD-10-CM | POA: Diagnosis not present

## 2017-11-19 DIAGNOSIS — Z6833 Body mass index (BMI) 33.0-33.9, adult: Secondary | ICD-10-CM | POA: Diagnosis not present

## 2017-11-19 DIAGNOSIS — C50512 Malignant neoplasm of lower-outer quadrant of left female breast: Secondary | ICD-10-CM | POA: Diagnosis not present

## 2017-11-19 DIAGNOSIS — Z791 Long term (current) use of non-steroidal anti-inflammatories (NSAID): Secondary | ICD-10-CM | POA: Diagnosis not present

## 2017-11-19 DIAGNOSIS — Z170 Estrogen receptor positive status [ER+]: Principal | ICD-10-CM

## 2017-11-19 DIAGNOSIS — Y838 Other surgical procedures as the cause of abnormal reaction of the patient, or of later complication, without mention of misadventure at the time of the procedure: Secondary | ICD-10-CM | POA: Insufficient documentation

## 2017-11-19 DIAGNOSIS — Z803 Family history of malignant neoplasm of breast: Secondary | ICD-10-CM | POA: Insufficient documentation

## 2017-11-19 DIAGNOSIS — R918 Other nonspecific abnormal finding of lung field: Secondary | ICD-10-CM | POA: Diagnosis not present

## 2017-11-19 DIAGNOSIS — N6012 Diffuse cystic mastopathy of left breast: Secondary | ICD-10-CM | POA: Diagnosis not present

## 2017-11-19 DIAGNOSIS — N6011 Diffuse cystic mastopathy of right breast: Secondary | ICD-10-CM | POA: Diagnosis not present

## 2017-11-19 DIAGNOSIS — K8020 Calculus of gallbladder without cholecystitis without obstruction: Secondary | ICD-10-CM | POA: Diagnosis not present

## 2017-11-19 DIAGNOSIS — G8918 Other acute postprocedural pain: Secondary | ICD-10-CM | POA: Diagnosis not present

## 2017-11-19 HISTORY — PX: MASTECTOMY W/ SENTINEL NODE BIOPSY: SHX2001

## 2017-11-19 LAB — CBC
HCT: 32.7 % — ABNORMAL LOW (ref 36.0–46.0)
HEMOGLOBIN: 10.5 g/dL — AB (ref 12.0–15.0)
MCH: 28 pg (ref 26.0–34.0)
MCHC: 32.1 g/dL (ref 30.0–36.0)
MCV: 87.2 fL (ref 78.0–100.0)
Platelets: 307 10*3/uL (ref 150–400)
RBC: 3.75 MIL/uL — AB (ref 3.87–5.11)
RDW: 12.9 % (ref 11.5–15.5)
WBC: 14.4 10*3/uL — ABNORMAL HIGH (ref 4.0–10.5)

## 2017-11-19 LAB — POCT I-STAT, CHEM 8
BUN: 7 mg/dL (ref 6–20)
CHLORIDE: 100 mmol/L — AB (ref 101–111)
Calcium, Ion: 1.06 mmol/L — ABNORMAL LOW (ref 1.15–1.40)
Creatinine, Ser: 0.5 mg/dL (ref 0.44–1.00)
Glucose, Bld: 229 mg/dL — ABNORMAL HIGH (ref 65–99)
HEMATOCRIT: 31 % — AB (ref 36.0–46.0)
Hemoglobin: 10.5 g/dL — ABNORMAL LOW (ref 12.0–15.0)
Potassium: 3.9 mmol/L (ref 3.5–5.1)
SODIUM: 135 mmol/L (ref 135–145)
TCO2: 25 mmol/L (ref 22–32)

## 2017-11-19 SURGERY — MASTECTOMY WITH SENTINEL LYMPH NODE BIOPSY
Anesthesia: General | Site: Breast | Laterality: Bilateral

## 2017-11-19 MED ORDER — PROMETHAZINE HCL 25 MG/ML IJ SOLN: 6.2500 mg | INTRAMUSCULAR | Status: DC | PRN

## 2017-11-19 MED ORDER — FENTANYL CITRATE (PF) 100 MCG/2ML IJ SOLN
INTRAMUSCULAR | Status: AC
  Filled 2017-11-19: qty 2

## 2017-11-19 MED ORDER — CELECOXIB 200 MG PO CAPS
200.0000 mg | ORAL_CAPSULE | Freq: Two times a day (BID) | ORAL | Status: DC
  Administered 2017-11-19: 200 mg via ORAL

## 2017-11-19 MED ORDER — ONDANSETRON 4 MG PO TBDP: 4.0000 mg | ORAL_TABLET | Freq: Four times a day (QID) | ORAL | Status: DC | PRN

## 2017-11-19 MED ORDER — DOCUSATE SODIUM 100 MG PO CAPS
100.0000 mg | ORAL_CAPSULE | Freq: Two times a day (BID) | ORAL | Status: DC
Start: 2017-11-19 — End: 2017-11-20

## 2017-11-19 MED ORDER — PHENYLEPHRINE HCL 10 MG/ML IJ SOLN
INTRAMUSCULAR | Status: AC
  Filled 2017-11-19: qty 1

## 2017-11-19 MED ORDER — LIDOCAINE HCL (CARDIAC) 20 MG/ML IV SOLN
INTRAVENOUS | Status: AC
  Filled 2017-11-19: qty 5

## 2017-11-19 MED ORDER — LIDOCAINE HCL (CARDIAC) 20 MG/ML IV SOLN
INTRAVENOUS | Status: DC | PRN
  Administered 2017-11-19: 10 mg via INTRAVENOUS

## 2017-11-19 MED ORDER — OXYCODONE HCL 5 MG PO TABS: 5.0000 mg | ORAL_TABLET | ORAL | Status: DC | PRN

## 2017-11-19 MED ORDER — MEPERIDINE HCL 25 MG/ML IJ SOLN: 6.2500 mg | INTRAMUSCULAR | Status: DC | PRN

## 2017-11-19 MED ORDER — MIDAZOLAM HCL 2 MG/2ML IJ SOLN
1.0000 mg | INTRAMUSCULAR | Status: DC | PRN
  Administered 2017-11-19: 2 mg via INTRAVENOUS

## 2017-11-19 MED ORDER — HYDROCHLOROTHIAZIDE 25 MG PO TABS: 25.0000 mg | ORAL_TABLET | Freq: Every day | ORAL | Status: DC

## 2017-11-19 MED ORDER — GABAPENTIN 300 MG PO CAPS
ORAL_CAPSULE | ORAL | Status: AC
  Filled 2017-11-19: qty 1

## 2017-11-19 MED ORDER — DIPHENHYDRAMINE HCL 12.5 MG/5ML PO ELIX: 12.5000 mg | ORAL_SOLUTION | Freq: Four times a day (QID) | ORAL | Status: DC | PRN

## 2017-11-19 MED ORDER — ACETAMINOPHEN 325 MG PO TABS: 650.0000 mg | ORAL_TABLET | Freq: Four times a day (QID) | ORAL | Status: DC | PRN

## 2017-11-19 MED ORDER — MORPHINE SULFATE (PF) 2 MG/ML IV SOLN: 1.0000 mg | INTRAVENOUS | Status: DC | PRN

## 2017-11-19 MED ORDER — LISINOPRIL 10 MG PO TABS
10.0000 mg | ORAL_TABLET | Freq: Every day | ORAL | Status: DC
  Administered 2017-11-19: 10 mg via ORAL

## 2017-11-19 MED ORDER — PHENYLEPHRINE HCL 10 MG/ML IJ SOLN
INTRAMUSCULAR | Status: DC | PRN
  Administered 2017-11-19 (×5): 80 ug via INTRAVENOUS

## 2017-11-19 MED ORDER — CEFAZOLIN SODIUM-DEXTROSE 2-4 GM/100ML-% IV SOLN
2.0000 g | Freq: Three times a day (TID) | INTRAVENOUS | Status: AC
  Administered 2017-11-19: 2 g via INTRAVENOUS
  Filled 2017-11-19: qty 100

## 2017-11-19 MED ORDER — PROPOFOL 10 MG/ML IV BOLUS
INTRAVENOUS | Status: AC
  Filled 2017-11-19: qty 20

## 2017-11-19 MED ORDER — FLUOCINONIDE 0.05 % EX OINT
1.0000 | TOPICAL_OINTMENT | Freq: Every day | CUTANEOUS | Status: DC | PRN
Start: 2017-11-19 — End: 2017-11-20

## 2017-11-19 MED ORDER — CEFAZOLIN SODIUM-DEXTROSE 2-4 GM/100ML-% IV SOLN
2.0000 g | INTRAVENOUS | Status: AC
  Administered 2017-11-19: 2 g via INTRAVENOUS

## 2017-11-19 MED ORDER — ACETAMINOPHEN 650 MG RE SUPP: 650.0000 mg | Freq: Four times a day (QID) | RECTAL | Status: DC | PRN

## 2017-11-19 MED ORDER — METHOCARBAMOL 500 MG PO TABS
500.0000 mg | ORAL_TABLET | Freq: Four times a day (QID) | ORAL | Status: DC | PRN
  Administered 2017-11-19: 500 mg via ORAL
  Filled 2017-11-19: qty 1

## 2017-11-19 MED ORDER — GABAPENTIN 300 MG PO CAPS
300.0000 mg | ORAL_CAPSULE | ORAL | Status: AC
  Administered 2017-11-19: 300 mg via ORAL

## 2017-11-19 MED ORDER — DEXAMETHASONE SODIUM PHOSPHATE 4 MG/ML IJ SOLN
INTRAMUSCULAR | Status: DC | PRN
  Administered 2017-11-19: 10 mg via INTRAVENOUS

## 2017-11-19 MED ORDER — POTASSIUM CHLORIDE CRYS ER 20 MEQ PO TBCR
20.0000 meq | EXTENDED_RELEASE_TABLET | Freq: Every day | ORAL | Status: DC
  Administered 2017-11-20: 20 meq via ORAL

## 2017-11-19 MED ORDER — CLONIDINE HCL (ANALGESIA) 100 MCG/ML EP SOLN
EPIDURAL | Status: DC | PRN
  Administered 2017-11-19: 80 ug

## 2017-11-19 MED ORDER — SODIUM CHLORIDE 0.9 % IJ SOLN
INTRAMUSCULAR | Status: AC
  Filled 2017-11-19: qty 10

## 2017-11-19 MED ORDER — PHENYLEPHRINE HCL 10 MG/ML IJ SOLN
INTRAVENOUS | Status: DC | PRN
  Administered 2017-11-19: 25 ug/min via INTRAVENOUS

## 2017-11-19 MED ORDER — CEFAZOLIN SODIUM-DEXTROSE 2-4 GM/100ML-% IV SOLN
INTRAVENOUS | Status: AC
  Filled 2017-11-19: qty 100

## 2017-11-19 MED ORDER — FENTANYL CITRATE (PF) 100 MCG/2ML IJ SOLN
INTRAMUSCULAR | Status: DC | PRN
  Administered 2017-11-19 (×4): 25 ug via INTRAVENOUS
  Administered 2017-11-19: 50 ug via INTRAVENOUS

## 2017-11-19 MED ORDER — ZOLPIDEM TARTRATE 5 MG PO TABS: 5.0000 mg | ORAL_TABLET | Freq: Every evening | ORAL | Status: DC | PRN

## 2017-11-19 MED ORDER — BUPIVACAINE-EPINEPHRINE (PF) 0.25% -1:200000 IJ SOLN
INTRAMUSCULAR | Status: AC
  Filled 2017-11-19: qty 30

## 2017-11-19 MED ORDER — CIPROFLOXACIN HCL 0.3 % OP SOLN: 1.0000 [drp] | OPHTHALMIC | Status: DC

## 2017-11-19 MED ORDER — FENTANYL CITRATE (PF) 100 MCG/2ML IJ SOLN
50.0000 ug | INTRAMUSCULAR | Status: DC | PRN
  Administered 2017-11-19: 100 ug via INTRAVENOUS

## 2017-11-19 MED ORDER — ONDANSETRON HCL 4 MG/2ML IJ SOLN
INTRAMUSCULAR | Status: DC | PRN
  Administered 2017-11-19: 4 mg via INTRAVENOUS

## 2017-11-19 MED ORDER — METHYLENE BLUE 0.5 % INJ SOLN
INTRAVENOUS | Status: AC
  Filled 2017-11-19: qty 10

## 2017-11-19 MED ORDER — SODIUM CHLORIDE 0.9 % IJ SOLN
INTRAVENOUS | Status: DC | PRN
  Administered 2017-11-19: 4 mL

## 2017-11-19 MED ORDER — CELECOXIB 200 MG PO CAPS
ORAL_CAPSULE | ORAL | Status: AC
  Filled 2017-11-19: qty 1

## 2017-11-19 MED ORDER — LACTATED RINGERS IV SOLN
INTRAVENOUS | Status: DC
  Administered 2017-11-19 (×3): via INTRAVENOUS

## 2017-11-19 MED ORDER — PROPOFOL 10 MG/ML IV BOLUS
INTRAVENOUS | Status: DC | PRN
  Administered 2017-11-19: 150 mg via INTRAVENOUS

## 2017-11-19 MED ORDER — FENTANYL CITRATE (PF) 100 MCG/2ML IJ SOLN
25.0000 ug | INTRAMUSCULAR | Status: DC | PRN
  Administered 2017-11-19 (×2): 50 ug via INTRAVENOUS

## 2017-11-19 MED ORDER — MIDAZOLAM HCL 5 MG/5ML IJ SOLN
INTRAMUSCULAR | Status: DC | PRN
  Administered 2017-11-19: 2 mg via INTRAVENOUS

## 2017-11-19 MED ORDER — ROPIVACAINE HCL 5 MG/ML IJ SOLN
INTRAMUSCULAR | Status: DC | PRN
  Administered 2017-11-19: 50 mL via PERINEURAL

## 2017-11-19 MED ORDER — TECHNETIUM TC 99M SULFUR COLLOID FILTERED
1.0000 | Freq: Once | INTRAVENOUS | Status: AC | PRN
  Administered 2017-11-19: 1 via INTRADERMAL

## 2017-11-19 MED ORDER — CELECOXIB 200 MG PO CAPS
200.0000 mg | ORAL_CAPSULE | ORAL | Status: AC
  Administered 2017-11-19: 200 mg via ORAL

## 2017-11-19 MED ORDER — SCOPOLAMINE 1 MG/3DAYS TD PT72: 1.0000 | MEDICATED_PATCH | Freq: Once | TRANSDERMAL | Status: DC | PRN

## 2017-11-19 MED ORDER — MIDAZOLAM HCL 2 MG/2ML IJ SOLN
INTRAMUSCULAR | Status: AC
  Filled 2017-11-19: qty 2

## 2017-11-19 MED ORDER — LIDOCAINE HCL (PF) 1 % IJ SOLN
INTRAMUSCULAR | Status: AC
  Filled 2017-11-19: qty 30

## 2017-11-19 MED ORDER — CHLORHEXIDINE GLUCONATE CLOTH 2 % EX PADS: 6.0000 | MEDICATED_PAD | Freq: Once | CUTANEOUS | Status: DC

## 2017-11-19 MED ORDER — ONDANSETRON HCL 4 MG/2ML IJ SOLN
INTRAMUSCULAR | Status: AC
Start: 2017-11-19 — End: ?
  Filled 2017-11-19: qty 2

## 2017-11-19 MED ORDER — OXYCODONE HCL 5 MG PO TABS: 5.0000 mg | ORAL_TABLET | Freq: Once | ORAL | 0 refills | Status: DC | PRN

## 2017-11-19 MED ORDER — KCL IN DEXTROSE-NACL 20-5-0.45 MEQ/L-%-% IV SOLN
INTRAVENOUS | Status: AC
  Administered 2017-11-19: 14:00:00 via INTRAVENOUS
  Filled 2017-11-19: qty 1000

## 2017-11-19 MED ORDER — HEPARIN SOD (PORK) LOCK FLUSH 100 UNIT/ML IV SOLN: 500.0000 [IU] | Freq: Once | INTRAVENOUS | Status: DC

## 2017-11-19 MED ORDER — HEPARIN SOD (PORK) LOCK FLUSH 100 UNIT/ML IV SOLN
500.0000 [IU] | INTRAVENOUS | Status: AC | PRN
  Administered 2017-11-19: 500 [IU]

## 2017-11-19 MED ORDER — OXYCODONE HCL 5 MG/5ML PO SOLN: 5.0000 mg | Freq: Once | ORAL | Status: DC | PRN

## 2017-11-19 MED ORDER — OXYCODONE HCL 5 MG PO TABS: 5.0000 mg | ORAL_TABLET | Freq: Once | ORAL | Status: DC | PRN

## 2017-11-19 MED ORDER — DIPHENHYDRAMINE HCL 50 MG/ML IJ SOLN: 12.5000 mg | Freq: Four times a day (QID) | INTRAMUSCULAR | Status: DC | PRN

## 2017-11-19 MED ORDER — GABAPENTIN 300 MG PO CAPS
300.0000 mg | ORAL_CAPSULE | Freq: Two times a day (BID) | ORAL | Status: DC
  Administered 2017-11-19: 300 mg via ORAL

## 2017-11-19 MED ORDER — PHENYLEPHRINE 40 MCG/ML (10ML) SYRINGE FOR IV PUSH (FOR BLOOD PRESSURE SUPPORT)
PREFILLED_SYRINGE | INTRAVENOUS | Status: AC
  Filled 2017-11-19: qty 10

## 2017-11-19 MED ORDER — DEXAMETHASONE SODIUM PHOSPHATE 10 MG/ML IJ SOLN
INTRAMUSCULAR | Status: AC
  Filled 2017-11-19: qty 1

## 2017-11-19 MED ORDER — ONDANSETRON HCL 4 MG/2ML IJ SOLN: 4.0000 mg | Freq: Four times a day (QID) | INTRAMUSCULAR | Status: DC | PRN

## 2017-11-19 MED ORDER — ACETAMINOPHEN 500 MG PO TABS
1000.0000 mg | ORAL_TABLET | ORAL | Status: AC
  Administered 2017-11-19: 1000 mg via ORAL

## 2017-11-19 MED ORDER — METHOCARBAMOL 500 MG PO TABS: 500.0000 mg | ORAL_TABLET | Freq: Three times a day (TID) | ORAL | 1 refills | Status: DC | PRN

## 2017-11-19 MED ORDER — ACETAMINOPHEN 500 MG PO TABS
ORAL_TABLET | ORAL | Status: AC
  Filled 2017-11-19: qty 2

## 2017-11-19 MED ORDER — SODIUM CHLORIDE 0.9 % IV SOLN
INTRAVENOUS | Status: DC | PRN
  Administered 2017-11-19: 1000 mL

## 2017-11-19 SURGICAL SUPPLY — 80 items
ADH SKN CLS APL DERMABOND .7 (GAUZE/BANDAGES/DRESSINGS) ×3
BAG DECANTER FOR FLEXI CONT (MISCELLANEOUS) ×3 IMPLANT
BINDER BREAST LRG (GAUZE/BANDAGES/DRESSINGS) IMPLANT
BINDER BREAST MEDIUM (GAUZE/BANDAGES/DRESSINGS) IMPLANT
BINDER BREAST XLRG (GAUZE/BANDAGES/DRESSINGS) IMPLANT
BINDER BREAST XXLRG (GAUZE/BANDAGES/DRESSINGS) ×1 IMPLANT
BIOPATCH RED 1 DISK 7.0 (GAUZE/BANDAGES/DRESSINGS) ×2 IMPLANT
BLADE CLIPPER SURG (BLADE) ×1 IMPLANT
BLADE HEX COATED 2.75 (ELECTRODE) ×2 IMPLANT
BLADE SURG 10 STRL SS (BLADE) ×2 IMPLANT
BLADE SURG 15 STRL LF DISP TIS (BLADE) ×1 IMPLANT
BLADE SURG 15 STRL SS (BLADE) ×4
CANISTER SUCT 1200ML W/VALVE (MISCELLANEOUS) ×4 IMPLANT
CHLORAPREP W/TINT 26ML (MISCELLANEOUS) ×3 IMPLANT
CLIP VESOCCLUDE LG 6/CT (CLIP) ×1 IMPLANT
CLIP VESOCCLUDE MED 6/CT (CLIP) ×4 IMPLANT
CLIP VESOCCLUDE SM WIDE 6/CT (CLIP) ×1 IMPLANT
COVER MAYO STAND STRL (DRAPES) ×2 IMPLANT
COVER PROBE W GEL 5X96 (DRAPES) ×2 IMPLANT
DECANTER SPIKE VIAL GLASS SM (MISCELLANEOUS) IMPLANT
DERMABOND ADVANCED (GAUZE/BANDAGES/DRESSINGS) ×3
DERMABOND ADVANCED .7 DNX12 (GAUZE/BANDAGES/DRESSINGS) ×1 IMPLANT
DRAIN CHANNEL 19F RND (DRAIN) ×3 IMPLANT
DRAPE UTILITY XL STRL (DRAPES) ×3 IMPLANT
DRSG PAD ABDOMINAL 8X10 ST (GAUZE/BANDAGES/DRESSINGS) ×4 IMPLANT
ELECT BLADE 4.0 EZ CLEAN MEGAD (MISCELLANEOUS) ×2
ELECT REM PT RETURN 9FT ADLT (ELECTROSURGICAL) ×2
ELECTRODE BLDE 4.0 EZ CLN MEGD (MISCELLANEOUS) IMPLANT
ELECTRODE REM PT RTRN 9FT ADLT (ELECTROSURGICAL) ×1 IMPLANT
EVACUATOR SILICONE 100CC (DRAIN) ×3 IMPLANT
GAUZE SPONGE 4X4 12PLY STRL (GAUZE/BANDAGES/DRESSINGS) ×2 IMPLANT
GLOVE BIO SURGEON STRL SZ 6 (GLOVE) ×3 IMPLANT
GLOVE BIO SURGEON STRL SZ7 (GLOVE) ×2 IMPLANT
GLOVE BIOGEL PI IND STRL 6.5 (GLOVE) ×1 IMPLANT
GLOVE BIOGEL PI IND STRL 7.5 (GLOVE) IMPLANT
GLOVE BIOGEL PI INDICATOR 6.5 (GLOVE) ×1
GLOVE BIOGEL PI INDICATOR 7.5 (GLOVE) ×2
GLOVE EXAM NITRILE MD LF STRL (GLOVE) ×1 IMPLANT
GLOVE SURG SYN 7.5  E (GLOVE) ×1
GLOVE SURG SYN 7.5 E (GLOVE) ×1 IMPLANT
GLOVE SURG SYN 7.5 PF PI (GLOVE) IMPLANT
GOWN STRL REUS W/ TWL LRG LVL3 (GOWN DISPOSABLE) ×1 IMPLANT
GOWN STRL REUS W/ TWL XL LVL3 (GOWN DISPOSABLE) IMPLANT
GOWN STRL REUS W/TWL 2XL LVL3 (GOWN DISPOSABLE) ×2 IMPLANT
GOWN STRL REUS W/TWL LRG LVL3 (GOWN DISPOSABLE) ×2
GOWN STRL REUS W/TWL XL LVL3 (GOWN DISPOSABLE) ×2
LIGHT WAVEGUIDE WIDE FLAT (MISCELLANEOUS) ×1 IMPLANT
NDL HYPO 25X1 1.5 SAFETY (NEEDLE) ×2 IMPLANT
NDL SAFETY ECLIPSE 18X1.5 (NEEDLE) ×1 IMPLANT
NDL SPNL 18GX3.5 QUINCKE PK (NEEDLE) ×1 IMPLANT
NDL SPNL 22GX3.5 QUINCKE BK (NEEDLE) IMPLANT
NEEDLE HYPO 18GX1.5 SHARP (NEEDLE)
NEEDLE HYPO 25X1 1.5 SAFETY (NEEDLE) ×2 IMPLANT
NEEDLE SPNL 18GX3.5 QUINCKE PK (NEEDLE) IMPLANT
NEEDLE SPNL 22GX3.5 QUINCKE BK (NEEDLE) IMPLANT
NS IRRIG 1000ML POUR BTL (IV SOLUTION) ×3 IMPLANT
PACK BASIN DAY SURGERY FS (CUSTOM PROCEDURE TRAY) ×2 IMPLANT
PACK UNIVERSAL I (CUSTOM PROCEDURE TRAY) ×2 IMPLANT
PENCIL BUTTON HOLSTER BLD 10FT (ELECTRODE) ×2 IMPLANT
PIN SAFETY STERILE (MISCELLANEOUS) ×2 IMPLANT
SLEEVE SCD COMPRESS KNEE MED (MISCELLANEOUS) ×2 IMPLANT
SPONGE LAP 18X18 RF (DISPOSABLE) ×8 IMPLANT
STAPLER VISISTAT 35W (STAPLE) ×1 IMPLANT
STOCKINETTE IMPERVIOUS LG (DRAPES) ×2 IMPLANT
STRIP CLOSURE SKIN 1/2X4 (GAUZE/BANDAGES/DRESSINGS) ×2 IMPLANT
SUT ETHILON 2 0 FS 18 (SUTURE) ×2 IMPLANT
SUT MNCRL AB 4-0 PS2 18 (SUTURE) ×8 IMPLANT
SUT SILK 0 TIES 10X30 (SUTURE) ×2 IMPLANT
SUT SILK 2 0 SH (SUTURE) IMPLANT
SUT VICRYL 3-0 CR8 SH (SUTURE) ×7 IMPLANT
SUT VICRYL AB 2 0 TIE (SUTURE) ×1 IMPLANT
SUT VICRYL AB 2 0 TIES (SUTURE) ×2
SYR 50ML LL SCALE MARK (SYRINGE) ×1 IMPLANT
SYR BULB IRRIGATION 50ML (SYRINGE) ×1 IMPLANT
SYR CONTROL 10ML LL (SYRINGE) ×2 IMPLANT
TOWEL OR 17X24 6PK STRL BLUE (TOWEL DISPOSABLE) ×2 IMPLANT
TOWEL OR NON WOVEN STRL DISP B (DISPOSABLE) ×1 IMPLANT
TUBE CONNECTING 20X1/4 (TUBING) ×3 IMPLANT
UNDERPAD 30X30 (UNDERPADS AND DIAPERS) ×3 IMPLANT
YANKAUER SUCT BULB TIP NO VENT (SUCTIONS) ×3 IMPLANT

## 2017-11-19 NOTE — Anesthesia Postprocedure Evaluation (Signed)
Anesthesia Post Note  Patient: Briana Price  Procedure(s) Performed: BILATERAL MASTECTOMIES WITH LEFT SENTINEL LYMPH NODE BIOPSY (Bilateral Breast)     Patient location during evaluation: PACU Anesthesia Type: General Level of consciousness: sedated and patient cooperative Pain management: pain level controlled Vital Signs Assessment: post-procedure vital signs reviewed and stable Respiratory status: spontaneous breathing Cardiovascular status: stable Anesthetic complications: no    Last Vitals:  Vitals:   11/19/17 1300 11/19/17 1339  BP:  117/70  Pulse:  80  Resp:  16  Temp:  37 C  SpO2: 95% 97%    Last Pain:  Vitals:   11/19/17 1339  TempSrc:   PainSc: 1                  Nolon Nations

## 2017-11-19 NOTE — Progress Notes (Signed)
Pt up to bathroom with assist.nurse noticed blood on breast binder.JP drain emptied of 60cc bloody drainage with large clot.left upper breast close to axilla feel firm.applied sterile 4x4s around drain insertion site Left side and changed breast binder.size extra large.Dr [name] notified.order for stat CBC.JP drain emptied of10cc.

## 2017-11-19 NOTE — Interval H&P Note (Signed)
History and Physical Interval Note:  11/19/2017 7:27 AM  Briana Price  has presented today for surgery, with the diagnosis of LEFT BREAST CANCER  The various methods of treatment have been discussed with the patient and family. After consideration of risks, benefits and other options for treatment, the patient has consented to  Procedure(s): BILATERAL MASTECTOMIES WITH LEFT SENTINEL LYMPH NODE BIOPSY (Bilateral) as a surgical intervention .  The patient's history has been reviewed, patient examined, no change in status, stable for surgery.  I have reviewed the patient's chart and labs.  Questions were answered to the patient's satisfaction.     [name]

## 2017-11-19 NOTE — Anesthesia Procedure Notes (Signed)
Procedure Name: LMA Insertion Date/Time: 11/19/2017 7:46 AM Performed by: Marrianne Mood, CRNA Pre-anesthesia Checklist: Patient identified, Emergency Drugs available, Suction available, Patient being monitored and Timeout performed Patient Re-evaluated:Patient Re-evaluated prior to induction Oxygen Delivery Method: Circle system utilized Preoxygenation: Pre-oxygenation with 100% oxygen Induction Type: IV induction Ventilation: Mask ventilation without difficulty LMA: LMA inserted LMA Size: 4.0 Number of attempts: 1 Airway Equipment and Method: Bite block Placement Confirmation: positive ETCO2 Tube secured with: Tape Dental Injury: Teeth and Oropharynx as per pre-operative assessment

## 2017-11-19 NOTE — Discharge Instructions (Addendum)
[facility] °[phone] ° °MASTECTOMY: POST OP INSTRUCTIONS ° °Always review your discharge instruction sheet given to you by the facility where your surgery was performed. °IF YOU HAVE DISABILITY OR FAMILY LEAVE FORMS, YOU MUST BRING THEM TO THE OFFICE FOR PROCESSING.   °DO NOT GIVE THEM TO YOUR DOCTOR. °A prescription for pain medication may be given to you upon discharge.  Take your pain medication as prescribed, if needed.  If narcotic pain medicine is not needed, then you may take acetaminophen (Tylenol) or ibuprofen (Advil) as needed. °1. Take your usually prescribed medications unless otherwise directed. °2. If you need a refill on your pain medication, please contact your pharmacy.  They will contact our office to request authorization.  Prescriptions will not be filled after 5pm or on week-ends. °3. You should follow a light diet the first few days after arrival home, such as soup and crackers, etc.  Resume your normal diet the day after surgery. °4. Most patients will experience some swelling and bruising on the chest and underarm.  Ice packs will help.  Swelling and bruising can take several days to resolve.  °5. It is common to experience some constipation if taking pain medication after surgery.  Increasing fluid intake and taking a stool softener (such as Colace) will usually help or prevent this problem from occurring.  A mild laxative (Milk of Magnesia or Miralax) should be taken according to package instructions if there are no bowel movements after 48 hours. °6. Unless discharge instructions indicate otherwise, leave your bandage dry and in place until your next appointment in 3-5 days.  You may take a limited sponge bath.  No tube baths or showers until the drains are removed.  You may have steri-strips (small skin tapes) in place directly over the incision.  These strips should be left on the skin for 7-10 days.  If your surgeon used skin glue on the incision, you may  shower in 24 hours.  The glue will flake off over the next 2-3 weeks.  Any sutures or staples will be removed at the office during your follow-up visit. °7. DRAINS:  If you have drains in place, it is important to keep a list of the amount of drainage produced each day in your drains.  Before leaving the hospital, you should be instructed on drain care.  Call our office if you have any questions about your drains. °8. ACTIVITIES:  You may resume regular (light) daily activities beginning the next day--such as daily self-care, walking, climbing stairs--gradually increasing activities as tolerated.  You may have sexual intercourse when it is comfortable.  Refrain from any heavy lifting or straining until approved by your doctor. °a. You may drive when you are no longer taking prescription pain medication, you can comfortably wear a seatbelt, and you can safely maneuver your car and apply brakes. °b. RETURN TO WORK:  __________________________________________________________ °9. You should see your doctor in the office for a follow-up appointment approximately 3-5 days after your surgery.  Your doctor’s nurse will typically make your follow-up appointment when she calls you with your pathology report.  Expect your pathology report 2-3 business days after your surgery.  You may call to check if you do not hear from us after three days.   °10. OTHER INSTRUCTIONS: ______________________________________________________________________________________________ ____________________________________________________________________________________________ °WHEN TO CALL YOUR DOCTOR: °1. Fever over 101.0 °2. Nausea and/or vomiting °3. Extreme swelling or bruising °4. Continued bleeding from incision. °5. Increased pain, redness, or drainage from the incision. °  The clinic staff is available to answer your questions during regular business hours.  Please don’t hesitate to call and ask to speak to one of the nurses for clinical  concerns.  If you have a medical emergency, go to the nearest emergency room or call 911.  A surgeon from [facility] is always on call at the hospital. °[address], [address], [place], [place]  [address] ? [address] [address], [place], [place]   [address] °[phone] ? [phone] ? FAX [phone] °Web site: [facility] ° ° °Post Anesthesia Home Care Instructions ° °Activity: °Get plenty of rest for the remainder of the day. A responsible individual must stay with you for 24 hours following the procedure.  °For the next 24 hours, DO NOT: °-Drive a car °-Operate machinery °-Drink alcoholic beverages °-Take any medication unless instructed by your physician °-Make any legal decisions or sign important papers. ° °Meals: °Start with liquid foods such as gelatin or soup. Progress to regular foods as tolerated. Avoid greasy, spicy, heavy foods. If nausea and/or vomiting occur, drink only clear liquids until the nausea and/or vomiting subsides. Call your physician if vomiting continues. ° °Special Instructions/Symptoms: °Your throat may feel dry or sore from the anesthesia or the breathing tube placed in your throat during surgery. If this causes discomfort, gargle with warm salt water. The discomfort should disappear within 24 hours. ° °If you had a scopolamine patch placed behind your ear for the management of post- operative nausea and/or vomiting: ° °1. The medication in the patch is effective for 72 hours, after which it should be removed.  Wrap patch in a tissue and discard in the trash. Wash hands thoroughly with soap and water. °2. You may remove the patch earlier than 72 hours if you experience unpleasant side effects which may include dry mouth, dizziness or visual disturbances. °3. Avoid touching the patch. Wash your hands with soap and water after contact with the patch. °   ° ° ° ° °JP Drain Totals °· Bring this sheet to all of your post-operative appointments while you have your  drains. °· Please measure your drains by CC's or ML's. °· Make sure you drain and measure your JP Drains 2 or 3 times per day. °· At the end of each day, add up totals for the left side and add up totals for the right side. °   ( 9 am )     ( 3 pm )        ( 9 pm )                °Date L  R  L  R  L  R  Total L/R  °               °               °               °               °               °               °               °               °               °               °               °               ° ° °

## 2017-11-19 NOTE — Progress Notes (Signed)
Assisted Dr. Lissa Hoard with Left and Right, ultrasound guided, pectoralis blocks. Side rails up, monitors on throughout procedure. See vital signs in flow sheet. Tolerated Procedure well.

## 2017-11-19 NOTE — Transfer of Care (Signed)
Immediate Anesthesia Transfer of Care Note  Patient: Briana Price  Procedure(s) Performed: BILATERAL MASTECTOMIES WITH LEFT SENTINEL LYMPH NODE BIOPSY (Bilateral Breast)  Patient Location: PACU  Anesthesia Type:GA combined with regional for post-op pain  Level of Consciousness: sedated  Airway & Oxygen Therapy: Patient Spontanous Breathing and Patient connected to face mask oxygen  Post-op Assessment: Report given to RN and Post -op Vital signs reviewed and stable  Post vital signs: Reviewed and stable  Last Vitals:  Vitals:   11/19/17 0727 11/19/17 0730  BP:  123/80  Pulse: 81 74  Resp: 13 13  Temp:    SpO2: 100% 100%    Last Pain:  Vitals:   11/19/17 0644  TempSrc: Oral         Complications: No apparent anesthesia complications

## 2017-11-19 NOTE — Op Note (Signed)
Bilateral Mastectomy with Left Sentinel Node mapping and Biopsy  Indications: This patient presents with history of left breast cancer, LOQ, Stage IIB, cT2N0M0, G3, triple negative, s/p neoadjuvant chemotherapy  Pre-operative Diagnosis: left breast cancer, desire for symmetry, high risk of recurrent cancer  Post-operative Diagnosis: same  Surgeon: [name]   Anesthesia: General endotracheal anesthesia and bilateral pectoral block  ASA Class: II  Procedure Details  The patient was seen in the Holding Room. The risks, benefits, complications, treatment options, and expected outcomes were discussed with the patient. The possibilities of reaction to medication, pulmonary aspiration, bleeding, infection, the need for additional procedures, failure to diagnose a condition, and creating a complication requiring transfusion or operation were discussed with the patient. The patient concurred with the proposed plan, giving informed consent.  The site of surgery properly noted/marked. The patient was taken to Operating Room # 8, identified as Briana Price and the procedure verified as Bilateral Mastectomy and Sentinel Node Biopsy. A Time Out was held and the above information confirmed.    After induction of anesthesia, the bilateral breast and chest were prepped and draped in standard fashion. The methylene blue was injected in the left subareolar location.      The right breast was addressed first as it was benign.  The borders of the breast were identified and marked.  The incisions of the breast were drawn out to make sure incision lines were equidistant in length. The superior incision was made with the #10 blade.  Mastectomy hooks were used to provide elevation of the skin edges, and the cautery was used to create the mastectomy flaps.  The dissection was taken to the fascia of the pectoralis major.  The right subclavian port was avoided.  The penetrating vessels were clipped.  The superior flap  was taken medially to the lateral sternal border, superiorly to the inferior border of the clavicle.  The inferior flap was similarly created, inferiorly to the inframammary fold and laterally to the border of the latissimus.  The breast was taken off including the pectoralis fascia and the axillary tail marked.  The wound was irrigated.  Hemostasis was achieved with cautery.  The wound was packed with moist laps while the left side was addressed.    At this point, the left mastectomy was performed in similar fashion.  The axilla was then addressed on the left.  Using a hand-held gamma probe, axillary sentinel nodes were identified.  Two deep level 2 axillary sentinel nodes were removed and submitted to pathology.  The findings are below.  The lymphovascular channels were clipped with metal clips.      The right side was reinspected for hemostasis.  The right side was irrigated with antibiotic solution.  One 19 Blake drain was placed laterally and secured with a 2-0 nylon.   The skin was closed with a 3-0 Vicryl deep dermal interrupted sutures and 4-0 Vicryl subcuticular closure in layers.    The left side was reinspected for hemostasis and then irrigated with antibiotic solution.  One 19 Blake drain was placed laterally and secured with a 2-0 nylon.   The skin was closed with a 3-0 Vicryl deep dermal interrupted sutures and 4-0 Vicryl subcuticular closure in layers.  Sterile dressings were applied. At the end of the operation, all sponge, instrument, and needle counts were correct.  Findings: grossly clear surgical margins, left SLN #1 cps 80, SLN #2 cps 10, background 0  Estimated Blood Loss: 25 mL  Drains: 19 Fr blake drain in right chest wall and one in left chest wall.                 Specimens: right breast, left breast and two deep axillary sentinel nodes         Complications:  None; patient tolerated the procedure well.         Disposition: PACU - hemodynamically stable.          Condition: stable

## 2017-11-19 NOTE — Progress Notes (Signed)
Porta cath accessed in the surgical center just for her routine flush. Per the patient her last port access and flush was Feb 1st. Port accessed without incident, great blood return. Flushed with 10cc NS followed by Heparin 70ml (100u/ml) then deaccessed. No bleeding to site, band aid to site for comfort. [employer]

## 2017-11-19 NOTE — Addendum Note (Signed)
Addendum  created 11/19/17 1407 by Nolon Nations, MD   Intraprocedure Meds edited

## 2017-11-19 NOTE — Progress Notes (Signed)
Called Dr. [name] with update about patients hemoglobin of 10.5. Patients oxygen saturation 94% on room air, 82 heart rate. Patient resting well and comfortable at this time. No new orders received. Dr. [name] to check on patient later. Will continue to closely monitor patient. Briana Price, Briana Price

## 2017-11-19 NOTE — Anesthesia Procedure Notes (Signed)
Anesthesia Regional Block: Pectoralis block   Pre-Anesthetic Checklist: ,, timeout performed, Correct Patient, Correct Site, Correct Laterality, Correct Procedure, Correct Position, site marked, Risks and benefits discussed,  Surgical consent,  Pre-op evaluation,  At surgeon's request and post-op pain management  Laterality: Left and Right  Prep: chloraprep       Needles:   Needle Type: Stimiplex     Needle Length: 9cm      Additional Needles:   Narrative:  Start time: 11/19/2017 7:10 AM End time: 11/19/2017 7:20 AM Injection made incrementally with aspirations every 5 mL.  Performed by: Personally  Anesthesiologist: Nolon Nations, MD  Additional Notes: Patient tolerated well. Good fascial spread noted.

## 2017-11-20 ENCOUNTER — Encounter (HOSPITAL_BASED_OUTPATIENT_CLINIC_OR_DEPARTMENT_OTHER): Payer: Self-pay | Admitting: General Surgery

## 2017-11-20 ENCOUNTER — Emergency Department (HOSPITAL_COMMUNITY)
Admission: EM | Admit: 2017-11-20 | Discharge: 2017-11-21 | Disposition: A | Discharge status: Home / Self Care | Payer: Federal, State, Local not specified - PPO | Source: Home / Self Care | Attending: Emergency Medicine | Admitting: Emergency Medicine

## 2017-11-20 DIAGNOSIS — I10 Essential (primary) hypertension: Secondary | ICD-10-CM | POA: Insufficient documentation

## 2017-11-20 DIAGNOSIS — L7622 Postprocedural hemorrhage and hematoma of skin and subcutaneous tissue following other procedure: Secondary | ICD-10-CM | POA: Diagnosis not present

## 2017-11-20 DIAGNOSIS — Z79899 Other long term (current) drug therapy: Secondary | ICD-10-CM | POA: Insufficient documentation

## 2017-11-20 DIAGNOSIS — T85838A Hemorrhage due to other internal prosthetic devices, implants and grafts, initial encounter: Secondary | ICD-10-CM

## 2017-11-20 DIAGNOSIS — Z7982 Long term (current) use of aspirin: Secondary | ICD-10-CM

## 2017-11-20 DIAGNOSIS — T82838A Hemorrhage of vascular prosthetic devices, implants and grafts, initial encounter: Secondary | ICD-10-CM

## 2017-11-20 DIAGNOSIS — Y828 Other medical devices associated with adverse incidents: Secondary | ICD-10-CM

## 2017-11-20 DIAGNOSIS — C50512 Malignant neoplasm of lower-outer quadrant of left female breast: Secondary | ICD-10-CM | POA: Diagnosis not present

## 2017-11-20 DIAGNOSIS — Z87891 Personal history of nicotine dependence: Secondary | ICD-10-CM

## 2017-11-20 MED ORDER — LIDOCAINE-PRILOCAINE 2.5-2.5 % EX CREA: TOPICAL_CREAM | Freq: Once | CUTANEOUS | Status: DC

## 2017-11-20 MED ORDER — LIDOCAINE 4 % EX CREA
TOPICAL_CREAM | CUTANEOUS | Status: AC
  Filled 2017-11-20: qty 5

## 2017-11-20 MED ORDER — GABAPENTIN 300 MG PO CAPS: 300.0000 mg | ORAL_CAPSULE | Freq: Two times a day (BID) | ORAL | 1 refills | Status: DC

## 2017-11-20 NOTE — Addendum Note (Signed)
Addendum  created 11/20/17 0906 by Tawni Millers, CRNA   Charge Capture section accepted

## 2017-11-20 NOTE — Progress Notes (Signed)
Seen,  Alert and oriented. Vitals stable.  Small hematoma left lower flap.    Pain well controlled.  Narcotic and robaxin sent to pharmacy.

## 2017-11-20 NOTE — ED Notes (Signed)
Bed: WLPT1 Expected date:  Expected time:  Means of arrival:  Comments: 

## 2017-11-20 NOTE — ED Notes (Signed)
Bed: WTR9 Expected date:  Expected time:  Means of arrival:  Comments: 

## 2017-11-21 ENCOUNTER — Other Ambulatory Visit: Payer: Self-pay

## 2017-11-21 ENCOUNTER — Emergency Department (HOSPITAL_COMMUNITY): Payer: Federal, State, Local not specified - PPO

## 2017-11-21 ENCOUNTER — Encounter (HOSPITAL_COMMUNITY): Payer: Self-pay | Admitting: Emergency Medicine

## 2017-11-21 DIAGNOSIS — C50512 Malignant neoplasm of lower-outer quadrant of left female breast: Secondary | ICD-10-CM | POA: Diagnosis not present

## 2017-11-21 DIAGNOSIS — R918 Other nonspecific abnormal finding of lung field: Secondary | ICD-10-CM | POA: Diagnosis not present

## 2017-11-21 NOTE — ED Triage Notes (Signed)
Patient had an double mastectomy. Patient is bleeding on the right side around the JP tube. The JP tubed has a few clots.

## 2017-11-21 NOTE — ED Notes (Signed)
Patient questioning the need for blood work. Please confirm the need for blood work.

## 2017-11-21 NOTE — ED Provider Notes (Signed)
[facility] DEPT Provider Note   CSN: [id number] Arrival date & time: 11/20/17  2155     History   Chief Complaint Chief Complaint  Patient presents with  . Post-op Problem    HPI Briana Price is a 52 y.o. female personal history of breast cancer who is status post mastectomy 2 days ago who presents for evaluation of postop issue.  Patient has bilateral JP drains inserted at the mastectomy sites.  Patient reports that tonight, she noticed a clot in the left JP drain and it was not draining as much.  Patient called the on-call surgeon and was prompted to go to the emergency department for further evaluation.  Patient reports she was also having some bleeding at the surgical incision site where the JP drain is sutured.  No purulent drainage noted.  She states that there has been no surrounding warmth, erythema, purulent drainage of the mastectomy incisions.  She reports she has not had any pain associated with her symptoms today.  Patient denies any fever, chest pain, difficulty breathing, warmth, erythema, abdominal pain, vomiting.   The history is provided by the patient.    Past Medical History:  Diagnosis Date  . Anemia   . Breast cancer ([place])   . Cancer [facility])    left breast cancer  . Family history of breast cancer   . Genetic testing 05/28/2017   Common Cancers panel (46 genes) @ [facility] - No pathogenic mutations detected  . GERD (gastroesophageal reflux disease)   . Headache    Migraines  . Hypertension     Patient Active Problem List   Diagnosis Date Noted  . Breast cancer of lower-outer quadrant of left female breast ([place]) 11/19/2017  . Port-A-Cath in place 06/19/2017  . Genetic testing 05/28/2017  . Breast cancer ([place])   . Family history of breast cancer   . Malignant neoplasm of lower-outer quadrant of left breast of female, estrogen receptor negative ([place]) 05/05/2017  . Cholelithiasis 01/11/2013    Past Surgical History:    Procedure Laterality Date  . BREAST SURGERY     biopsy  . CESAREAN SECTION    . CHOLECYSTECTOMY N/A 02/07/2013   Procedure: LAPAROSCOPIC CHOLECYSTECTOMY WITH INTRAOPERATIVE CHOLANGIOGRAM;  Surgeon: Gwenyth Ober, MD;  Location: [facility];  Service: General;  Laterality: N/A;  . COLONOSCOPY W/ BIOPSIES AND POLYPECTOMY    . FOOT SURGERY  2003   lt foot bunionectomy  . MASTECTOMY W/ SENTINEL NODE BIOPSY Bilateral 11/19/2017   Procedure: BILATERAL MASTECTOMIES WITH LEFT SENTINEL LYMPH NODE BIOPSY;  Surgeon: [name], MD;  Location: [facility];  Service: General;  Laterality: Bilateral;  . MULTIPLE TOOTH EXTRACTIONS    . PORTACATH PLACEMENT Right 05/19/2017   Procedure: INSERTION PORT-A-CATH;  Surgeon: [name], MD;  Location: [facility];  Service: General;  Laterality: Right;    OB History    No data available       Home Medications    Prior to Admission medications   Medication Sig Start Date End Date Taking? Authorizing Provider  aspirin-acetaminophen-caffeine (EXCEDRIN MIGRAINE) [phone] MG tablet Take 2 tablets by mouth 2 (two) times daily as needed for headache.   Yes [provider]  atorvastatin (LIPITOR) 20 MG tablet Take 20 mg by mouth daily. 11/13/17  Yes [provider]  Cholecalciferol (VITAMIN D) 2000 units CAPS Take 2,000 Units by mouth daily.   Yes [provider]  ferrous sulfate 325 (65 FE) MG tablet Take  325 mg by mouth every other day.    Yes [provider]  fluocinonide ointment (LIDEX) 6.01 % Apply 1 application topically daily as needed (rosacea).   Yes [provider]  gabapentin (NEURONTIN) 300 MG capsule Take 1 capsule (300 mg total) by mouth 2 (two) times daily. 11/20/17  Yes [name], MD  hydrochlorothiazide (HYDRODIURIL) 25 MG tablet Take 25 mg by mouth daily.   Yes [provider]  lisinopril (PRINIVIL,ZESTRIL) 10 MG tablet Take 10 mgs by mouth daily 02/13/17  Yes  [provider]  methocarbamol (ROBAXIN) 500 MG tablet Take 1 tablet (500 mg total) by mouth every 8 (eight) hours as needed for muscle spasms. 11/19/17  Yes [name], MD  Multiple Vitamins-Minerals (MULTIVITAMIN ADULT PO) Take 1 tablet by mouth daily.   Yes [provider]  ondansetron (ZOFRAN) 8 MG tablet Take 8 mg by mouth every 8 (eight) hours as needed for nausea or vomiting.   Yes [provider]  potassium chloride SA (K-DUR,KLOR-CON) 20 MEQ tablet Take 1 tablet (20 mEq total) by mouth daily. 09/04/17  Yes Owens Shark, NP  cephALEXin (KEFLEX) 500 MG capsule Take 1 capsule (500 mg total) by mouth 3 (three) times daily. Patient not taking: Reported on 11/21/2017 11/09/17   [name], MD  oxyCODONE (OXY IR/ROXICODONE) 5 MG immediate release tablet Take 1 tablet (5 mg total) by mouth once as needed (for pain score of 1-4). 11/19/17   [name], MD  prochlorperazine (COMPAZINE) 10 MG tablet Take 1 tablet (10 mg total) by mouth every 6 (six) hours as needed (Nausea or vomiting). 05/08/17 07/23/17  [name], MD    Family History Family History  Problem Relation Age of Onset  . Breast cancer Maternal Grandmother        dx 87s; deceased 68  . Breast cancer Paternal Aunt 66       recurrence vs. 2nd primary at 75; currently 62  . Breast cancer Maternal Aunt 62       deceased 21  . Hypertension Mother   . Breast cancer Other        pat grandfather's sister; dx 35s  . Leukemia Paternal Uncle        deceased 75  . Breast cancer Other        paternal distant cousin; dx 80s    Social History Social History   Tobacco Use  . Smoking status: Former Smoker    Types: Cigarettes  . Smokeless tobacco: Never Used  . Tobacco comment: 15-20 years ago  Substance Use Topics  . Alcohol use: No  . Drug use: No     Allergies   Patient has no known allergies.   Review of Systems Review of Systems  Constitutional: Negative for fever.  Respiratory: Negative  for shortness of breath.   Cardiovascular: Negative for chest pain.  Gastrointestinal: Negative for abdominal pain, nausea and vomiting.  Genitourinary: Negative for dysuria and hematuria.  Skin: Positive for wound. Negative for color change.  Neurological: Negative for headaches.  All other systems reviewed and are negative.    Physical Exam Updated Vital Signs BP (!) 158/86 (BP Location: Right Leg)   Pulse 73   Temp 98 F (36.7 C) (Oral)   Resp 18   Ht 5\' 6"  (1.676 m)   Wt 96.2 kg (212 lb)   SpO2 99%   BMI 34.22 kg/m   Physical Exam  Constitutional: She is oriented to person, place, and time. She appears well-developed  and well-nourished.  HENT:  Head: Normocephalic and atraumatic.  Mouth/Throat: Oropharynx is clear and moist and mucous membranes are normal.  Eyes: Conjunctivae, EOM and lids are normal. Pupils are equal, round, and reactive to light.  Neck: Full passive range of motion without pain.  Cardiovascular: Normal rate, regular rhythm, normal heart sounds and normal pulses. Exam reveals no gallop and no friction rub.  No murmur heard. Pulses:      Radial pulses are 2+ on the right side, and 2+ on the left side.  Pulmonary/Chest: Effort normal and breath sounds normal.  Bilateral mastectomy surgical incisions with staples in place.  No surrounding warmth, erythema, purulent drainage.  No deformity or crepitus noted.  Bilateral JP drains noted at each site.  Left JP drain is intact with sutures in place.  No surrounding bleeding.  Right JP drain intact with sutures in place.  No surrounding bleeding.  Bilateral JP drains are draining serosanguineous fluid. No evidence of respiratory distress. Able to speak in full sentences without difficulty.  Abdominal: Soft. Normal appearance. There is no tenderness. There is no rigidity and no guarding.  Musculoskeletal: Normal range of motion.  Neurological: She is alert and oriented to person, place, and time.  Skin: Skin is warm  and dry. Capillary refill takes less than 2 seconds.  Good distal cap refill. BUE are not dusky in appearance or cool to touch.  Psychiatric: She has a normal mood and affect. Her speech is normal.  Nursing note and vitals reviewed.    ED Treatments / Results  Labs (all labs ordered are listed, but only abnormal results are displayed) Labs Reviewed - No data to display  EKG  EKG Interpretation None       Radiology Dg Chest 2 View  Result Date: 11/21/2017 CLINICAL DATA:  Bilateral mastectomy 11/19/2017 with bilateral drains. EXAM: CHEST - 2 VIEW COMPARISON:  Chest radiograph 05/19/2017 FINDINGS: Power injectable right chest wall Port-A-Cath tip is in the lower SVC 5A right subclavian approach. There are bilateral drainage catheters at the mastectomy beds. Lungs are clear. Normal cardiomediastinal contours. IMPRESSION: No active cardiopulmonary disease. Electronically Signed   By: Ulyses Jarred M.D.   On: 11/21/2017 03:30   Nm Sentinel Node Inj-no Rpt (breast)  Result Date: 11/19/2017 Sulfur colloid was injected by the nuclear medicine technologist for melanoma sentinel node.    Procedures Procedures (including critical care time)  Medications Ordered in ED Medications - No data to display   Initial Impression / Assessment and Plan / ED Course  I have reviewed the triage vital signs and the nursing notes.  Pertinent labs & imaging results that were available during my care of the patient were reviewed by me and considered in my medical decision making (see chart for details).     52 y.o. F with PMH/o breast cancer who is 2 days post-op of a bilateral mastectomy for evaluation of JP drain problem.  Patient reports that she has bilateral JP drains in her surgical incision spot.  She reports they have been draining.  Patient reports that this evening, she noted some blood clot in the left drain.  She called the surgery office and was prompted to go to the ED for further  evaluation.  Patient denies any associated pain to the area.  Patient denies any fevers, chest pain, difficulty breathing, warmth, erythema of the area.  Patient reports that in the initial triage, the drains were evaluated and the clot was removed without any difficulty.  Since  then, the drain has been draining without any difficulty.  She notes that is been draining serous and with his fluid is more bloody in nature.  My evaluation, both drains are draining appropriately.  There are draining serosanguineous fluid.  No purulent drainage noted.  Mastectomy incision are without warmth, erythema, purulent drainage.  The insertion site of the JP drains that there will sutured in place with no surrounding drainage.  Initial labs ordered at triage.  We will also plan for chest x-ray for evaluation of tube placement.  Patient refused lab draw until discussed with surgery.  Chest x-ray reviewed.  Negative for any pneumothorax, lung abnormalities.  Drains appear placed appropriately.  Will consult general surgery.  4:00 AM: Discussed with Dr. Hassell Done (Gen Surg) guarding workup here in the ED. he recommends no further workup in the ED tonight.  He states it is acceptable that patient is having more bloody drainage in her JP drains bilaterally.  He recommends having patient follow-up in the outpatient office this week.  Her next appointment is within the next 1.5 weeks.  He recommends having the patient call the office on Monday morning to arrange for a follow-up outpatient appointment.  I discussed plan with patient.  She is agreeable to plan.  Reevaluation shows that the JP drain on the left is actively draining.  No evidence of clots.  Patient denies any pain at this time.  She does not wish to have her blood drawn at this time.  Discussed drain care with patient.  Dr. to follow-up with Delsa Sale surge and to call their office on Monday morning to arrange for an appointment this week. Patient had ample opportunity for  questions and discussion. All patient's questions were answered with full understanding. Strict return precautions discussed. Patient expresses understanding and agreement to plan.   Final Clinical Impressions(s) / ED Diagnoses   Final diagnoses:  Bleeding from Jackson-Pratt drain, initial encounter    ED Discharge Orders    None       Desma Mcgregor 11/21/17 3300    Palumbo, April, MD 11/21/17 [phone]

## 2017-11-21 NOTE — Discharge Instructions (Signed)
Continue with drain management care.  Follow-up with the surgeon's office.  Call their office on Monday to arrange for follow-up appointment this week.  Return to the emergency department for any warmth, swelling, redness, drainage from the incision sites, worsening pain, clots noted in the JP drains or any other worsening or concerning symptoms.

## 2017-11-21 NOTE — ED Notes (Signed)
Patient had bilateral mastectomy 11-19-17 with 2 JP drains in place. Left side JP drain was bleeding at the insertion site and had clots present. Triage RN worked the clot down the tubing of JP drain and drained 25 ml out. Patient had no c/o pain.

## 2017-11-22 ENCOUNTER — Other Ambulatory Visit: Payer: Self-pay | Admitting: Nurse Practitioner

## 2017-11-22 DIAGNOSIS — E876 Hypokalemia: Secondary | ICD-10-CM

## 2017-11-25 NOTE — Progress Notes (Signed)
Please let patient know that NO residual cancer was seen and LN are negative.

## 2017-11-26 ENCOUNTER — Other Ambulatory Visit: Payer: Federal, State, Local not specified - PPO

## 2017-11-26 ENCOUNTER — Ambulatory Visit: Payer: Federal, State, Local not specified - PPO | Admitting: Hematology

## 2017-12-01 NOTE — Progress Notes (Signed)
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 Highly suspicious mass within the LEFT breast at the 5:30 o'clock axis, 4 cm from the nipple, measuring 2.5 cm, corresponding to the mammographic finding and corresponding to 1 of the palpable lumps in the left breast. Ultrasound-guided biopsy is recommended. 2. Mildly prominent lymph node in the LEFT axilla, with normal cortical thickness but with questionable effacement of the fatty hilum. Ultrasound-guided core biopsy is recommended. 3. No evidence of malignancy within the RIGHT breast. Also, no evidence of malignancy within the second area of clinical concern in the upper outer left breast.      04/29/2017 Initial Diagnosis    Malignant neoplasm of lower-outer quadrant of left breast of female, estrogen receptor negative ([place])      04/29/2017 Receptors her2    Estrogen Receptor: 0%, NEGATIVE Progesterone Receptor: 0%, NEGATIVE Proliferation Marker Ki67: 90% HER2 NEGATIVE       04/29/2017 Initial Biopsy     Diagnosis 1. 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 - SEE COMMENT. 2. Lymph node, needle/core biopsy, left axilla - THERE IN NO EVIDENCE OF CARCINOMA IN 1 OF 1 LYMPH NODE (0/1).      05/14/2017 Imaging    MRI of Breast Bilateral 05/14/17  IMPRESSION: 1. 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 2. Non mass enhancement extending approximately 3.2 cm anterior to the mass in the lower inner quadrant of the left breast, suspicious for DCIS. There is no correlate on recent mammography. The overall extent of the mass and non mass enhancement is approximately 5 cm. 3. No MRI evidence of malignancy involving the right breast. 4. No pathologic lymphadenopathy. Upper normal sized left axillary lymph nodes, the largest of which was previously biopsied no evidence of metastatic disease.      05/15/2017 Imaging    Bone scan whole body 05/15/17 IMPRESSION: No findings specific for metastatic disease to bone.      05/15/2017 Imaging    CT CAP W contrast 05/15/17  IMPRESSION: 1. 18 mm soft tissue lesion inferior left breast compatible with known primary malignancy. 2. Mildly enlarged left axillary lymph nodes in this patient with biopsy-proven metastatic disease to the left axilla. 3. No evidence for lymphadenopathy elsewhere in the chest. No lymphadenopathy in the abdomen or pelvis. No evidence for metastatic disease in the abdomen or pelvis. 4. Tiny right perifissural lung nodule most likely subpleural lymph node. Attention on follow-up recommended. 5. 7 mm hypoattenuating lesion in the spleen, likely a cyst or pseudocyst. Attention on follow-up  recommended. 6. Uterine fibroids. 7.  Aortic Atherosclerois (ICD10-170.0)      05/19/2017 Procedure    Port placement by Dr. [name] on 9/11/8      05/22/2017 - 10/09/2017 Chemotherapy     neoadjuvant chemotherpy AC every  2 weeks for 4 cycles starting 05/22/17 and completed on 07/07/17, followed by weekly paclitaxel for 12 cycles starting 07/24/17 to complete on 10/09/17       05/22/2017 Pathology Results    Diagnosis Breast, left, needle core biopsy, lower inner quad - DUCTAL CARCINOMA IN SITU. 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  The genes analyzed were the 46 genes on [facility]'s Common Cancers panel (APC, ATM, AXIN2, BARD1, BMPR1A, BRCA1, BRCA2, BRIP1, CDH1, CDKN2A, CHEK2, CTNNA1, DICER1, EPCAM, GREM1, HOXB13, KIT, MEN1, MLH1, MSH2, MSH3, MSH6, MUTYH, NBN, NF1, NTHL1, PALB2, PDGFRA, PMS2, POLD1, POLE, PTEN, RAD50, RAD51C, RAD51D, SDHA, SDHB, SDHC, SDHD, SMAD4, SMARCA4, STK11, TP53, TSC1, TSC2, VHL).        08/14/2017 Echocardiogram    EF 60-65%  Study Conclusions  - Left ventricle: The cavity size was normal. Systolic function was   normal. The estimated ejection fraction was in the range of 60%   to 65%. Wall motion was normal; there were no regional wall   motion abnormalities. Doppler parameters are consistent with   abnormal left ventricular relaxation (grade 1 diastolic   dysfunction). - Pulmonary arteries: PA peak pressure: 31 mm Hg (S).  Impressions:  - No change from September 2018 study (strain rate not performed on   that study).         10/14/2017 Imaging    MRI Breast Bilateral  IMPRESSION:  Significant improvement following neoadjuvant treatment.  No residual enhancement in the left breast at sites of known malignancy.  RECOMMENDATION: Treatment plan for known malignancy.      11/19/2017 Surgery    BILATERAL MASTECTOMIES WITH LEFT SENTINEL LYMPH NODE BIOPSY by Dr. [name] 11/19/17      11/19/2017 Pathology Results     Diagnosis 11/19/17 1. Breast, simple mastectomy, Right - FIBROCYSTIC AND FIBROADENOMATOID CHANGE. - NO MALIGNANCY  IDENTIFIED. 2. Breast, simple mastectomy, Left - NO RESIDUAL CARCINOMA IDENTIFIED. - BIOPSY SITES. - FIBROCYSTIC AND FIBROADENOMATOID CHANGE. - SEE ONCOLOGY TABLE. 3. Lymph node, sentinel, biopsy, Left Axillary #1 - ONE OF ONE LYMPH NODES NEGATIVE FOR CARCINOMA (0/1). - BIOPSY SITE. 4. Lymph node, sentinel, biopsy, Left Axillary # 2 - ONE OF ONE LYMPH NODES NEGATIVE FOR CARCINOMA (0/1).      HISTORY OF PRESENTING ILLNESS: 8/29/8  Briana Price 52 y.o. 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  She presents to the clinic today with her mother and father.   She first noticed her a mass in her left breast on the 17th of August, It was a hard knot, no pain or tenderness. Her mammogram form 03/24/17 and other previous mammograms were all normal.   In the past she was diagnosed with HTN and takes hydrochlorothiazide and lisinopril. She had her gallbladder removed. Her maternal grandmother, maternal aunt and paternal aunt had breast cancer. 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 The pain can last up to 3 days.   Today she denies skin change around breast, pain, fatigue or weight loss. She still has regular periods that last 3 days. 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   GYN HISTORY  Menarchal: 14 LMP: 05/01/17 Contraceptive: No HRT: n/a GP: G1P1 at age 19, she has a son  CURRENT THERAPY: surveillance   INTERVAL HISTORY:  Briana Price is here for a follow up. She presents to the clinic today with her family post surgery, having had a b/l mastectomy on 11/19/17. She presented to the ED the day after due to bleeding from her JP draining tube.Her draining tubes were removed 3 days ago. She notes she ran out of her oral potassium and is not taking it now. She plans to see Dr. [name] in 12/2017 and will contact [facility] about reconstruction surgery.    On review of symptoms, pt note pulling in her right axillary post surgery, no pain noted.         MEDICAL HISTORY:  Past Medical History:  Diagnosis Date  . Anemia   . Breast cancer ([place])   . Cancer [facility])    left breast cancer  . Family history of breast cancer   . Genetic testing 05/28/2017   Common Cancers panel (46 genes) @ [facility] - No pathogenic mutations detected  . GERD (gastroesophageal reflux disease)   . Headache    Migraines  . Hypertension    SURGICAL HISTORY: Past Surgical History:  Procedure Laterality Date  . BREAST SURGERY     biopsy  . CESAREAN SECTION    . CHOLECYSTECTOMY N/A 02/07/2013   Procedure: LAPAROSCOPIC CHOLECYSTECTOMY WITH INTRAOPERATIVE CHOLANGIOGRAM;  Surgeon: Gwenyth Ober, MD;  Location: [place];  Service: General;  Laterality: N/A;  . COLONOSCOPY W/ BIOPSIES AND POLYPECTOMY    . FOOT SURGERY  2003   lt foot bunionectomy  . MASTECTOMY W/ SENTINEL NODE BIOPSY Bilateral 11/19/2017   Procedure: BILATERAL MASTECTOMIES WITH LEFT SENTINEL LYMPH NODE BIOPSY;  Surgeon: [name], MD;  Location: [facility];  Service: General;  Laterality: Bilateral;  . MULTIPLE TOOTH EXTRACTIONS    . PORTACATH PLACEMENT Right 05/19/2017   Procedure: INSERTION PORT-A-CATH;  Surgeon: [name], MD;  Location: [place];  Service: General;  Laterality: Right;   SOCIAL HISTORY: Social History   Socioeconomic History  . Marital status: Single    Spouse name: Not on file  . Number of children: Not on file  . Years of education: Not on file  . Highest education level: Not on file  Occupational History  . Not on file  Social Needs  . Financial resource strain: Not on file  . Food insecurity:    Worry: Not on file    Inability: Not on file  . Transportation needs:    Medical: Not on file    Non-medical: Not on file  Tobacco Use  . Smoking status: Former Smoker    Types: Cigarettes  . Smokeless tobacco: Never Used  . Tobacco comment: 15-20 years ago  Substance and Sexual Activity  . Alcohol use: No  . Drug use: No  . Sexual  activity: Not on file  Lifestyle  . Physical activity:    Days per week: Not on file    Minutes per session: Not on file  . Stress: Not on file  Relationships  . Social connections:    Talks on phone: Not on file    Gets together: Not on file    Attends religious service: Not on file    Active member of club or organization: Not on file    Attends meetings of clubs or organizations: Not on file  Relationship status: Not on file  . Intimate partner violence:    Fear of current or ex partner: Not on file    Emotionally abused: Not on file    Physically abused: Not on file    Forced sexual activity: Not on file  Other Topics Concern  . Not on file  Social History Narrative   Tobacco use   Cigarettes: Never smoked    Tobacco history last updated 01/23/2014   No smoking   No tobacco exposure   No alcohol   Caffeine : yes soda   No recreational drug use   Occupation :employed ,[employer]ie [employer]a 01/25/2013   Martial status : single    Children: Louie Casa 01/25/2013   65 year old son   FAMILY HISTORY: Family History  Problem Relation Age of Onset  . Breast cancer Maternal Grandmother        dx 63s; deceased 62  . Breast cancer Paternal Aunt 81       recurrence vs. 2nd primary at 62; currently 22  . Breast cancer Maternal Aunt 62       deceased 67  . Hypertension Mother   . Breast cancer Other        pat grandfather's sister; dx 59s  . Leukemia Paternal Uncle        deceased 9  . Breast cancer Other        paternal distant cousin; dx 63s   ALLERGIES:  has No Known Allergies.  MEDICATIONS:  Current Outpatient Medications  Medication Sig Dispense Refill  . atorvastatin (LIPITOR) 20 MG tablet Take 20 mg by mouth daily.  1  . Cholecalciferol (VITAMIN D) 2000 units CAPS Take 2,000 Units by mouth daily.    . ferrous sulfate 325 (65 FE) MG tablet Take 325 mg by mouth every other day.     . fluocinonide ointment (LIDEX) 1.69 % Apply 1 application topically daily as needed  (rosacea).    . gabapentin (NEURONTIN) 300 MG capsule Take 1 capsule (300 mg total) by mouth 2 (two) times daily. 30 capsule 1  . hydrochlorothiazide (HYDRODIURIL) 25 MG tablet Take 25 mg by mouth daily.    Marland Kitchen lisinopril (PRINIVIL,ZESTRIL) 10 MG tablet Take 10 mgs by mouth daily  2  . methocarbamol (ROBAXIN) 500 MG tablet Take 1 tablet (500 mg total) by mouth every 8 (eight) hours as needed for muscle spasms. 30 tablet 1  . Multiple Vitamins-Minerals (MULTIVITAMIN ADULT PO) Take 1 tablet by mouth daily.    Marland Kitchen oxyCODONE (OXY IR/ROXICODONE) 5 MG immediate release tablet Take 1 tablet (5 mg total) by mouth once as needed (for pain score of 1-4). 30 tablet 0  . aspirin-acetaminophen-caffeine (EXCEDRIN MIGRAINE) 250-250-65 MG tablet Take 2 tablets by mouth 2 (two) times daily as needed for headache.    . ondansetron (ZOFRAN) 8 MG tablet Take 8 mg by mouth every 8 (eight) hours as needed for nausea or vomiting.     No current facility-administered medications for this visit.    Facility-Administered Medications Ordered in Other Visits  Medication Dose Route Frequency Provider Last Rate Last Dose  . sodium chloride flush (NS) 0.9 % injection 10 mL  10 mL Intravenous PRN [name], MD   10 mL at 08/07/17 1354   REVIEW OF SYSTEMS:   Constitutional: Denies fevers, chills or abnormal night sweats  Eyes: Denies blurriness of vision, double vision or watery eyes Ears, nose, mouth, throat, and face: Denies mucositis or sore throat Respiratory: Denies cough, dyspnea  or wheezes Cardiovascular: Denies palpitation, chest discomfort or lower extremity swelling (+) mild heart murmur Gastrointestinal:  Denies nausea, heartburn or change in bowel habits Skin: Denies abnormal skin rashes Lymphatics: Denies new lymphadenopathy or easy bruising Neurological:Denies new weaknesses (+) 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 Breast (+) pulling in right axillary post  surgery  All other systems were reviewed with the patient and are negative.  PHYSICAL EXAMINATION: ECOG PERFORMANCE STATUS: 1 BP (!) 171/102 (BP Location: Left Leg, Patient Position: Sitting, Cuff Size: Large)   Pulse 74   Temp 98.7 F (37.1 C) (Oral)   Resp 20   Ht '5\' 6"'$  (1.676 m)   Wt 212 lb 4.8 oz (96.3 kg)   SpO2 100%   BMI 34.27 kg/m   Vitals:   12/03/17 1102  BP: (!) 171/102  Pulse: 74  Resp: 20  Temp: 98.7 F (37.1 C)  TempSrc: Oral  SpO2: 100%  Weight: 212 lb 4.8 oz (96.3 kg)  Height: '5\' 6"'$  (1.676 m)    GENERAL:alert, no distress and comfortable SKIN: skin color, texture, turgor are normal, no rashes or significant lesions EYES: normal, conjunctiva are pink and non-injected, sclera clear OROPHARYNX:no exudate, no erythema and lips, buccal mucosa, and tongue normal  NECK: supple, thyroid normal size, non-tender, without nodularity LYMPH:  no palpable lymphadenopathy in the cervical, axillary or inguinal LUNGS: clear to auscultation and percussion with normal breathing effort HEART: regular rate and no lower extremity edema (+) mild Heart murmur, related to anemia  ABDOMEN:abdomen soft, non-tender and normal bowel sounds Musculoskeletal:no cyanosis of digits and no clubbing  PSYCH: alert & oriented x 3 with fluent speech NEURO: no focal motor/sensory deficits Breast: S/p b/l mastectomy:  (+) Surgical incisions are healing well. Mild old bleed from left breast incision.   LABORATORY DATA:  I have reviewed the data as listed CBC Latest Ref Rng & Units 12/03/2017 11/19/2017 11/19/2017  WBC 3.9 - 10.3 K/uL 6.4 - 14.4(H)  Hemoglobin 11.6 - 15.9 g/dL 11.2(L) 10.5(L) 10.5(L)  Hematocrit 34.8 - 46.6 % 34.0(L) 31.0(L) 32.7(L)  Platelets 145 - 400 K/uL 418(H) - 307   CMP Latest Ref Rng & Units 12/03/2017 11/19/2017 10/29/2017  Glucose 70 - 140 mg/dL 103 229(H) 129  BUN 7 - 26 mg/dL 5(L) 7 6(L)  Creatinine 0.60 - 1.10 mg/dL 0.66 0.50 0.72  Sodium 136 - 145 mmol/L 139 135 142   Potassium 3.5 - 5.1 mmol/L 3.7 3.9 4.0  Chloride 98 - 109 mmol/L 103 100(L) 103  CO2 22 - 29 mmol/L 29 - 30(H)  Calcium 8.4 - 10.4 mg/dL 10.2 - 10.1  Total Protein 6.4 - 8.3 g/dL 7.2 - 7.4  Total Bilirubin 0.2 - 1.2 mg/dL 0.7 - 0.6  Alkaline Phos 40 - 150 U/L 65 - 67  AST 5 - 34 U/L 25 - 27  ALT 0 - 55 U/L 22 - 31   PATHOLOGY  Diagnosis 11/19/17 1. Breast, simple mastectomy, Right - FIBROCYSTIC AND FIBROADENOMATOID CHANGE. - NO MALIGNANCY IDENTIFIED. 2. Breast, simple mastectomy, Left - NO RESIDUAL CARCINOMA IDENTIFIED. - BIOPSY SITES. - FIBROCYSTIC AND FIBROADENOMATOID CHANGE. - SEE ONCOLOGY TABLE. 3. Lymph node, sentinel, biopsy, Left Axillary #1 - ONE OF ONE LYMPH NODES NEGATIVE FOR CARCINOMA (0/1). - BIOPSY SITE. 4. Lymph node, sentinel, biopsy, Left Axillary # 2 - ONE OF ONE LYMPH NODES NEGATIVE FOR CARCINOMA (0/1). Microscopic Comment 2. BREAST, STATUS POST NEOADJUVANT TREATMENT Procedure: Left simple mastectomy and left axillary sentinel lymph  node biopsies. Laterality: Left. Tumor Size: No residual tumor. Histologic Type: No residual tumor. Prior biopsy with invasive ductal carcinoma. Grade: No residual tumor. Prior biopsy grade 2-3. Ductal Carcinoma in Situ (DCIS): Not identified. Regional Lymph Nodes: Number of Lymph Nodes Examined: 2 Number of Sentinel Lymph Nodes Examined: 2 Lymph Nodes with Macrometastases: 0 Lymph Nodes with Micrometastases: 0 Lymph Nodes with Isolated Tumor Cells: 0 Margins: Invasive carcinoma, distance from closest margin: N/A. DCIS, distance from closest margin: N/A. 1 of 3 FINAL for Briana, Price [phone]) Microscopic Comment(continued) Extent of Tumor: N/A. Breast Prognostic Profile (pre-neoadjuvant case #: [id number]) Estrogen Receptor: Negative. Progesterone Receptor: Negative. Her2: Negative. Ki-67: 90% Residual Cancer Burden (RCB): Complete pathologic response. Pathologic Stage Classification (p TNM, AJCC 8th  Edition): Primary Tumor (ypT): ypT0 Regional Lymph Nodes (ypN): ypN0  Diagnosis 04/29/17 1. Breast, left, needle core biopsy, 5:30 o'clock, mass - INVASIVE DUCTAL CARCINOMA. - DUCTAL CARCINOMA IN SITU. - SEE COMMENT. 2. Lymph node, needle/core biopsy, left axilla - THERE IN NO EVIDENCE OF CARCINOMA IN 1 OF 1 LYMPH NODE (0/1). Microscopic Comment 1. The carcinoma appears grade 2-3. A breast prognostic profile will be performed and the results reported separately. The results were called to [facility] on 04/30/17. (JBK:gt, 04/30/17) 1. PROGNOSTIC INDICATORS Results: IMMUNOHISTOCHEMICAL AND MORPHOMETRIC ANALYSIS PERFORMED MANUALLY Estrogen Receptor: 0%, NEGATIVE Progesterone Receptor: 0%, NEGATIVE Proliferation Marker Ki67: 90% COMMENT: The negative hormone receptor study(ies) in this case has an internal positive control. 1. FLUORESCENCE IN-SITU HYBRIDIZATION Results: HER2 - NEGATIVE RATIO OF HER2/CEP17 SIGNALS 1.11 AVERAGE HER2 COPY NUMBER PER CELL 2.1    PROCEDURES   ECHO 05/14/17 Study Conclusions - Left ventricle: The cavity size was normal. Systolic function was normal. The estimated ejection fraction was in the range of 55% to 60%. Wall motion was normal; there were no regional wall motion abnormalities. Left ventricular diastolic function parameters were normal.  RADIOGRAPHIC STUDIES: I have personally reviewed the radiological images as listed and agreed with the findings in the report.  Bone scan whole body 05/15/17 IMPRESSION: No findings specific for metastatic disease to bone.  CT CAP W contrast 05/15/17  IMPRESSION: 1. 18 mm soft tissue lesion inferior left breast compatible with known primary malignancy. 2. Mildly enlarged left axillary lymph nodes in this patient with biopsy-proven metastatic disease to the left axilla. 3. No evidence for lymphadenopathy elsewhere in the chest. No lymphadenopathy in the abdomen or pelvis. No evidence for  metastatic disease in the abdomen or pelvis. 4. Tiny right perifissural lung nodule most likely subpleural lymph node. Attention on follow-up recommended. 5. 7 mm hypoattenuating lesion in the spleen, likely a cyst or pseudocyst. Attention on follow-up recommended. 6. Uterine fibroids. 7.  Aortic Atherosclerois (ICD10-170.0)  Diagnostic Mammogram 04/27/17 IMPRESSION: 1. Highly suspicious mass within the LEFT breast at the 5:30 o'clock axis, 4 cm from the nipple, measuring 2.5 cm, corresponding to the mammographic finding and corresponding to 1 of the palpable lumps in the left breast. Ultrasound-guided biopsy is recommended. 2. Mildly prominent lymph node in the LEFT axilla, with normal ortical thickness but with questionable effacement of the fatty hilum. Ultrasound-guided core biopsy is recommended. 3. No evidence of malignancy within the RIGHT breast. Also, no evidence of malignancy within the second area of clinical concern in the upper outer left breast.  Dg Chest 2 View  Result Date: 11/21/2017 CLINICAL DATA:  Bilateral mastectomy 11/19/2017 with bilateral drains. EXAM: CHEST - 2 VIEW COMPARISON:  Chest radiograph 05/19/2017 FINDINGS: Power injectable right  chest wall Port-A-Cath tip is in the lower SVC 5A right subclavian approach. There are bilateral drainage catheters at the mastectomy beds. Lungs are clear. Normal cardiomediastinal contours. IMPRESSION: No active cardiopulmonary disease. Electronically Signed   By: Ulyses Jarred M.D.   On: 11/21/2017 03:30   Nm Sentinel Node Inj-no Rpt (breast)  Result Date: 11/19/2017 Sulfur colloid was injected by the nuclear medicine technologist for melanoma sentinel node.   ASSESSMENT & PLAN:  Briana Price is a 52 y.o. african-american female with a history of HTN and arthritis in her back, presented with a palpable left breast mass.  1. Malignant neoplasm of lower-outer quadrant of left breast of female, invasive ductal carcinoma, c2T0M0,  Stage IIB, ER/PR: negative, HER2: negative, Grade 3. ypT0N0 -We discussed her mammogram, ultrasound findings, and initial biopsy results with patient and her family members in details.  -She presented with a palpable left breast mass, measures 2.5 cm on ultrasound, with a prominent lymph node. 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 -I reviewed her breast MRI findings, which showed additional non-mass enhancement in the left breast, which is anterior to the biopsy proven breast cancer, in the lower inner quadrant. I recommend her to have additional biopsy, she agrees, we'll schedule for this week. -I reviewed her CT scan and bone scan findings in great detail, no evidence of distant metastasis. -she received neoadjuvant chemotherapy with dose dense ACX4 followed by weekly taxol X12 -She underwent B/l mastectomy with left sentinel lymph node biopsy on 11/19/17. We discussed her pathology results that showed complete pathological response to neoadjuvant treatment, 2 nodes negative.  -Given her results, she is no longer eligible for the [facility] trail due to lack of residual disease and she does not need adjuvant chemotherapy such as Xeloda. I will discuss her possible option for radiation with Dr. Lisbeth Renshaw.  -She plans to undergo reconstruction surgery and will discuss this with [facility]. She can get her port removed with reconstruction and will flush port every 6 weeks until then.  -I explained she is fine to have blood drawn from right arm as there were no right axillary lymph nodes removed.  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  -I discussed cancer surveillance with pt. Will follow her for 5 years with regular visits and labs every 4-6 months for the first 2-3 years then yearly. No need for mammograms.  -I discussed her incidental findings of benign lesions in spleen and lung. She will repeat  scan in a few months to monitor.  -I offered the option of survivorship clinic to adjust post-treatment. She is interested. Will schedule at next visit.  -F/u in 3 months    2. Genetics -Based on her significant familial breast cancer history, her young age and triple negative disease, I suggest she gets genetic testing for possible gene mutations.  -Apt with Roma Kayser in genetics on 05/28/17 -Positive for Variant of Uncertain Significance: ATM, but negative for other 46 genes on [facility]'s Common Cancers panel (APC, ATM, AXIN2, BARD1, BMPR1A, BRCA1, BRCA2, BRIP1, CDH1, CDKN2A, CHEK2, CTNNA1, DICER1, EPCAM, GREM1, HOXB13, KIT, MEN1, MLH1, MSH2, MSH3, MSH6, MUTYH, NBN, NF1, NTHL1, PALB2, PDGFRA, PMS2, POLD1, POLE, PTEN, RAD50, RAD51C, RAD51D, SDHA, SDHB, SDHC, SDHD, SMAD4, SMARCA4, STK11, TP53, TSC1, TSC2, VHL).  3. HTN -she is on HCTZ and lisinopril , we'll continue, she will follow-up with her primary care physician  -We discussed the  impact of chemotherapy, blood pressure, and a possibility that we may hold her blood pressure medication if she gets dehydrated.  -BP at 171/102 today (12/03/17)  4. Rosacea -Dermatologist prescribed topical steroid -OK to use during chemotherapy; she will also receive IV steroid which may help control  5. Lung nodules -Her restaging CT scan showed tiny right lung nodule, 1-2 mm, likely benign, follow-up recommended.  She never smoked, low risk for lung cancer. -May get a CT chest without contrast in the summer, if stable or resolved, no further surveillance scan is needed.   6. Hypokalemia -She is on Kdur 20 mEQ daily  -Based on today's labs may stop her oral potassium     PLAN:  -Lab, flush and f/u in 3 months  -Port flush in 6 weeks, port will be removed during her reconstruction surgery -OK to stop KCl  -will discuss with Dr. Lisbeth Renshaw about postmastectomy radiation  No orders of the defined types were placed in this encounter.  All questions were  answered. The patient knows to call the clinic with any problems, questions or concerns. I spent 20 minutes counseling the patient face to face. The total time spent in the appointment was 25 minutes and more than 50% was on counseling.  This document serves as a record of services personally performed by [name], MD. It was created on her behalf by Joslyn Devon, a trained medical scribe. The creation of this record is based on the scribe's personal observations and the provider's statements to them.   I have reviewed the above documentation for accuracy and completeness, and I agree with the above.      [name], MD 12/03/2017 2:44 PM

## 2017-12-03 ENCOUNTER — Inpatient Hospital Stay: Payer: Federal, State, Local not specified - PPO | Attending: Hematology

## 2017-12-03 ENCOUNTER — Encounter: Payer: Self-pay | Admitting: Hematology

## 2017-12-03 ENCOUNTER — Inpatient Hospital Stay (HOSPITAL_BASED_OUTPATIENT_CLINIC_OR_DEPARTMENT_OTHER): Payer: Federal, State, Local not specified - PPO | Admitting: Hematology

## 2017-12-03 ENCOUNTER — Other Ambulatory Visit: Payer: Federal, State, Local not specified - PPO

## 2017-12-03 VITALS — BP 171/102 | HR 74 | Temp 98.7°F | Resp 20 | Ht 66.0 in | Wt 212.3 lb

## 2017-12-03 DIAGNOSIS — C773 Secondary and unspecified malignant neoplasm of axilla and upper limb lymph nodes: Secondary | ICD-10-CM

## 2017-12-03 DIAGNOSIS — G620 Drug-induced polyneuropathy: Secondary | ICD-10-CM | POA: Diagnosis not present

## 2017-12-03 DIAGNOSIS — Z79899 Other long term (current) drug therapy: Secondary | ICD-10-CM

## 2017-12-03 DIAGNOSIS — Z87891 Personal history of nicotine dependence: Secondary | ICD-10-CM | POA: Diagnosis not present

## 2017-12-03 DIAGNOSIS — R918 Other nonspecific abnormal finding of lung field: Secondary | ICD-10-CM | POA: Insufficient documentation

## 2017-12-03 DIAGNOSIS — Z452 Encounter for adjustment and management of vascular access device: Secondary | ICD-10-CM | POA: Insufficient documentation

## 2017-12-03 DIAGNOSIS — E876 Hypokalemia: Secondary | ICD-10-CM | POA: Insufficient documentation

## 2017-12-03 DIAGNOSIS — K219 Gastro-esophageal reflux disease without esophagitis: Secondary | ICD-10-CM | POA: Insufficient documentation

## 2017-12-03 DIAGNOSIS — Z803 Family history of malignant neoplasm of breast: Secondary | ICD-10-CM | POA: Diagnosis not present

## 2017-12-03 DIAGNOSIS — C50512 Malignant neoplasm of lower-outer quadrant of left female breast: Secondary | ICD-10-CM

## 2017-12-03 DIAGNOSIS — Z171 Estrogen receptor negative status [ER-]: Secondary | ICD-10-CM | POA: Diagnosis not present

## 2017-12-03 DIAGNOSIS — L719 Rosacea, unspecified: Secondary | ICD-10-CM | POA: Insufficient documentation

## 2017-12-03 DIAGNOSIS — Z9221 Personal history of antineoplastic chemotherapy: Secondary | ICD-10-CM

## 2017-12-03 DIAGNOSIS — Z7982 Long term (current) use of aspirin: Secondary | ICD-10-CM

## 2017-12-03 DIAGNOSIS — T451X5S Adverse effect of antineoplastic and immunosuppressive drugs, sequela: Secondary | ICD-10-CM

## 2017-12-03 DIAGNOSIS — Z9013 Acquired absence of bilateral breasts and nipples: Secondary | ICD-10-CM | POA: Insufficient documentation

## 2017-12-03 DIAGNOSIS — M1990 Unspecified osteoarthritis, unspecified site: Secondary | ICD-10-CM | POA: Insufficient documentation

## 2017-12-03 DIAGNOSIS — Z9889 Other specified postprocedural states: Secondary | ICD-10-CM

## 2017-12-03 DIAGNOSIS — Z806 Family history of leukemia: Secondary | ICD-10-CM | POA: Diagnosis not present

## 2017-12-03 DIAGNOSIS — I10 Essential (primary) hypertension: Secondary | ICD-10-CM | POA: Diagnosis not present

## 2017-12-03 LAB — COMPREHENSIVE METABOLIC PANEL
ALT: 22 U/L (ref 0–55)
AST: 25 U/L (ref 5–34)
Albumin: 3.6 g/dL (ref 3.5–5.0)
Alkaline Phosphatase: 65 U/L (ref 40–150)
Anion gap: 7 (ref 3–11)
BUN: 5 mg/dL — ABNORMAL LOW (ref 7–26)
CHLORIDE: 103 mmol/L (ref 98–109)
CO2: 29 mmol/L (ref 22–29)
CREATININE: 0.66 mg/dL (ref 0.60–1.10)
Calcium: 10.2 mg/dL (ref 8.4–10.4)
Glucose, Bld: 103 mg/dL (ref 70–140)
Potassium: 3.7 mmol/L (ref 3.5–5.1)
Sodium: 139 mmol/L (ref 136–145)
Total Bilirubin: 0.7 mg/dL (ref 0.2–1.2)
Total Protein: 7.2 g/dL (ref 6.4–8.3)

## 2017-12-03 LAB — CBC WITH DIFFERENTIAL/PLATELET
BASOS ABS: 0.1 10*3/uL (ref 0.0–0.1)
BASOS PCT: 1 %
EOS ABS: 0.2 10*3/uL (ref 0.0–0.5)
EOS PCT: 3 %
HCT: 34 % — ABNORMAL LOW (ref 34.8–46.6)
Hemoglobin: 11.2 g/dL — ABNORMAL LOW (ref 11.6–15.9)
Lymphocytes Relative: 20 %
Lymphs Abs: 1.3 10*3/uL (ref 0.9–3.3)
MCH: 27.5 pg (ref 25.1–34.0)
MCHC: 33 g/dL (ref 31.5–36.0)
MCV: 83.4 fL (ref 79.5–101.0)
MONO ABS: 0.6 10*3/uL (ref 0.1–0.9)
Monocytes Relative: 10 %
Neutro Abs: 4.2 10*3/uL (ref 1.5–6.5)
Neutrophils Relative %: 66 %
PLATELETS: 418 10*3/uL — AB (ref 145–400)
RBC: 4.07 MIL/uL (ref 3.70–5.45)
RDW: 13.7 % (ref 11.2–14.5)
WBC: 6.4 10*3/uL (ref 3.9–10.3)

## 2017-12-03 MED ORDER — SODIUM CHLORIDE 0.9 % IJ SOLN
10.0000 mL | Freq: Once | INTRAMUSCULAR | Status: AC
  Administered 2017-12-03: 10 mL
  Filled 2017-12-03: qty 10

## 2017-12-03 MED ORDER — HEPARIN SOD (PORK) LOCK FLUSH 100 UNIT/ML IV SOLN
500.0000 [IU] | Freq: Once | INTRAVENOUS | Status: AC
  Administered 2017-12-03: 500 [IU]
  Filled 2017-12-03: qty 5

## 2017-12-04 ENCOUNTER — Telehealth: Payer: Self-pay | Admitting: Hematology

## 2017-12-04 NOTE — Telephone Encounter (Signed)
Appointments scheduled Calendar/ letter mailed to patient per 3/28 los

## 2017-12-07 DIAGNOSIS — K5750 Diverticulosis of both small and large intestine without perforation or abscess without bleeding: Secondary | ICD-10-CM | POA: Insufficient documentation

## 2017-12-16 ENCOUNTER — Ambulatory Visit: Payer: Federal, State, Local not specified - PPO | Attending: General Surgery | Admitting: Physical Therapy

## 2017-12-16 ENCOUNTER — Other Ambulatory Visit: Payer: Self-pay

## 2017-12-16 VITALS — BP 120/82

## 2017-12-16 DIAGNOSIS — R600 Localized edema: Secondary | ICD-10-CM | POA: Insufficient documentation

## 2017-12-16 DIAGNOSIS — M25612 Stiffness of left shoulder, not elsewhere classified: Secondary | ICD-10-CM

## 2017-12-16 NOTE — Therapy (Addendum)
[place], [place], [address] Phone: [phone]   Fax:  [phone]  Physical Therapy Evaluation  Patient Details  Name: Briana Price MRN: [id number] Date of Birth: March 18, 1966 Referring Provider: Dr. [name]   Encounter Date: 12/16/2017  PT End of Session - 12/16/17 1645    Visit Number  1    Number of Visits  2    Date for PT Re-Evaluation  01/15/18    PT Start Time  8676    PT Stop Time  1531    PT Time Calculation (min)  54 min    Activity Tolerance  Patient tolerated treatment well    Behavior During Therapy  [facility] for tasks assessed/performed       Past Medical History:  Diagnosis Date  . Anemia   . Breast cancer ([place])   . Cancer [facility])    left breast cancer  . Family history of breast cancer   . Genetic testing 05/28/2017   Common Cancers panel (46 genes) @ [facility] - No pathogenic mutations detected  . GERD (gastroesophageal reflux disease)   . Headache    Migraines  . Hypertension     Past Surgical History:  Procedure Laterality Date  . BREAST SURGERY     biopsy  . CESAREAN SECTION    . CHOLECYSTECTOMY N/A 02/07/2013   Procedure: LAPAROSCOPIC CHOLECYSTECTOMY WITH INTRAOPERATIVE CHOLANGIOGRAM;  Surgeon: Gwenyth Ober, MD;  Location: [place];  Service: General;  Laterality: N/A;  . COLONOSCOPY W/ BIOPSIES AND POLYPECTOMY    . FOOT SURGERY  2003   lt foot bunionectomy  . MASTECTOMY W/ SENTINEL NODE BIOPSY Bilateral 11/19/2017   Procedure: BILATERAL MASTECTOMIES WITH LEFT SENTINEL LYMPH NODE BIOPSY;  Surgeon: [name], MD;  Location: [place];  Service: General;  Laterality: Bilateral;  . MULTIPLE TOOTH EXTRACTIONS    . PORTACATH PLACEMENT Right 05/19/2017   Procedure: INSERTION PORT-A-CATH;  Surgeon: [name], MD;  Location: [place];  Service: General;  Laterality: Right;    Vitals:   12/16/17 1450  BP: 120/82     Subjective  Assessment - 12/16/17 1442    Subjective  "I had a double mastectomy on March 14th. I'm not having any trouble, I'm not having any pain." After the mastectomy it's like one side is bigger than the other." Plans to have DIEP flap reconstruction in six months.    Pertinent History  Neo-adjuvant chemotherapy, then double mastectomy on 11/19/17 for cancer in left breast, triple negative, with 2 lymph nodes removed on left, both negative. Won't have radiation; will just have follow-ups now. HTN and on meds, but with variable control. Borderline hyperlipidemia. No other health issues.    Patient Stated Goals  get checked with area of swelling and other post-mastectomy issues    Currently in Pain?  No/denies         [facility] PT Assessment - 12/16/17 0001      Assessment   Medical Diagnosis  left breast cancer s/p bilateral mastectomy 11/19/17 for triple negative cancer on the left; had neo-adjuvant chemo    Referring Provider  Dr. [name]    Onset Date/Surgical Date  11/19/17    Hand Dominance  Right    Prior Therapy  none      Precautions   Precautions  Other (comment)    Precaution Comments  cancer precautions      Restrictions   Weight Bearing Restrictions  No  Balance Screen   Has the patient fallen in the past 6 months  No    Has the patient had a decrease in activity level because of a fear of falling?   No    Is the patient reluctant to leave their home because of a fear of falling?   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 lifting    Leisure  no regular exercise      Cognition   Overall Cognitive Status  Within Functional Limits for tasks assessed       Observation/Other Assessments   Observations  left mastectomy area is visibly larger/fuller than right    Skin Integrity  still with steri strips in place across both incisions; appears to have some scabbing still      Posture/Postural Control   Posture/Postural Control  Postural limitations    Postural Limitations  Forward head      ROM / Strength   AROM / PROM / Strength  AROM      AROM   AROM Assessment Site  Shoulder    Right/Left Shoulder  Right;Left    Right Shoulder Flexion  157 Degrees in sitting    Right Shoulder ABduction  167 Degrees    Right Shoulder Internal Rotation  63 Degrees in sitting    Right Shoulder External Rotation  95 Degrees    Left Shoulder Flexion  143 Degrees    Left Shoulder ABduction  149 Degrees    Left Shoulder Internal Rotation  90 Degrees    Left Shoulder External Rotation  91 Degrees      Palpation   Palpation comment  left mastectomy area feels firmer than right; decreased soft tissue mobility at both incision areas        LYMPHEDEMA/ONCOLOGY QUESTIONNAIRE - 12/16/17 1512      Type   Cancer Type  left breast      Surgeries   Mastectomy Date  11/19/17 bilat    Sentinel Lymph Node Biopsy Date  11/19/17    Number Lymph Nodes Removed  2 left side      Treatment   Past Chemotherapy Treatment  Yes    Date  10/09/17    Active Radiation Treatment  No    Past Radiation Treatment  No      Lymphedema Assessments   Lymphedema Assessments  Upper extremities      Right Upper Extremity Lymphedema   10 cm Proximal to Olecranon Process  34.7 cm    Olecranon Process  27.2 cm    10 cm Proximal to Ulnar Styloid Process  22.8 cm    Just Proximal to Ulnar Styloid Process  17 cm    Across Hand at [employer]  21 cm    At [place] of 2nd Digit  6.3 cm      Left Upper Extremity Lymphedema   10 cm Proximal to Olecranon Process  34.8 cm    Olecranon Process  27.8 cm    10 cm Proximal to Ulnar Styloid Process  22.8 cm  Just Proximal to Ulnar  Styloid Process  16.6 cm    Across Hand at [employer]  19.4 cm    At [place] of 2nd Digit  6.2 cm               Objective measurements completed on examination: See above findings.      [place] Adult PT Treatment/Exercise - 12/16/17 0001      Self-Care   Self-Care  Other Self-Care Comments    Other Self-Care Comments   Cut 1/2 inch foam pad and placed it in TG soft, then instructed patient in wearing it on left side under pink smocked compression top or new bra when she gets it, and explained results we hope to see.             PT Education - 12/16/17 1644    Education provided  Yes    Education Details  about the benefit of exercise for statistically reducing cancer recurrence risk; about her low risk for lymphedema, about use of foam pad with compression garment to try to reduce left chest induration, about ABC class    Person(s) Educated  Patient    Methods  Explanation;Handout    Comprehension  Verbalized understanding          PT Long Term Goals - 12/16/17 1654      PT LONG TERM GOAL #1   Title  Pt. will be independent in HEP for shoulder ROM.    Time  2    Period  Weeks    Status  New      PT LONG TERM GOAL #2   Title  Pt. will be knowledgeable about lymphedema risk reduction practices.    Time  2    Period  Weeks    Status  New             Plan - 12/16/17 1646    Clinical Impression Statement  This is a patient approx. 4 weeks s/p bilateral mastectomy with left SLNB for left triple positive breast cancer. She had neo-adjuvant chemo and will not have radiation. She shows fairly good shoulder AROM for this stage post-surgery, though left is more limited than right.  She does have a difference in the appearance of the right and left sides of her chest, the left appearing fuller. Both are indurated and both have limited mobility of the soft tissue around her incisions, which still have steri strips in place and appear to have some scabs still. She  was encouraged to attend free ABC class but also has one follow-up appointment with [place] as she may not be able to attend the free class due to conflicts. She has not previously learned shoulder ROM exercises.    Clinical Presentation  Stable    Clinical Decision Making  Low    Rehab Potential  Good    PT Frequency  1x / week    PT Duration  -- 1 week    PT Treatment/Interventions  ADLs/Self Care Home Management;DME Instruction;Therapeutic exercise;Patient/family education;Manual techniques;Scar mobilization;Passive range of motion    PT Next Visit Plan  See if patient did or will attend ABC class.  If not, go over shoulder ROM HEP and lymphedema risk reduction practices as well as how to do scar mobilization. If yes, review stretches, discuss safe progression of strengthening exercises, and teach scar mobilization (possibly to be done later). Check whether foam pad worked to reduce induration.    Consulted and Agree with Plan of  Care  Patient       Patient will benefit from skilled therapeutic intervention in order to improve the following deficits and impairments:  Decreased range of motion, Increased edema, Increased fascial restricitons  Visit Diagnosis: Localized edema - Plan: PT plan of care cert/re-cert  Stiffness of left shoulder, not elsewhere classified - Plan: PT plan of care cert/re-cert     Problem List Patient Active Problem List   Diagnosis Date Noted  . Breast cancer of lower-outer quadrant of left female breast ([place]) 11/19/2017  . Port-A-Cath in place 06/19/2017  . Genetic testing 05/28/2017  . Breast cancer ([place])   . Family history of breast cancer   . Malignant neoplasm of lower-outer quadrant of left breast of female, estrogen receptor negative ([place]) 05/05/2017  . Cholelithiasis 01/11/2013    SALISBURY,DONNA 12/16/2017, 4:59 PM  [place], [place], [address] Phone: [phone]   Fax:   [phone]  Name: Briana Price MRN: [id number] Date of Birth: December 07, 1965  Serafina Royals, PT 12/16/17 5:00 PM

## 2017-12-16 NOTE — Patient Instructions (Signed)
[facility] [address] [place], [place] [address]         [phone]  Why exercise?  So many benefits! Here are SOME of them: 1. Heart health, including raising your good cholesterol level and reducing heart rate and blood pressure 2. Lung health, including improved lung capacity 3. It burns fats, and most of us can stand to be leaner, whether or not we are overweight. 4. It increases the body's natural painkillers and mood elevators, so makes you feel better. 5. Not only makes you feel better, but look better too 6. Improves sleep 7. Takes a bite out of stress 8. May decrease your risk of many types of cancer 9. If you are currently undergoing cancer treatment, exercise may improve your ability to tolerate treatments including chemotherapy. 10. For everybody, it can improve your energy level. Those with cancer-related fatigue report a 40-50% reduction in this symptom when exercising regularly. 11. If you are a survivor of breast, colon, or prostate cancer, it may decrease your risk of a recurrence. (This may hold for other cancers too, but so far we have data just for these three types.)  How to exercise: 1. Get your doctor's okay. 2. Pick something you enjoy doing, like walking, Zumba, biking, swimming, or whatever. 3. Start at low intensity and time, then gradually increase.  (See walking program handout.) 4. Set a goal to achieve over time.  The American Cancer Society, American Heart Association, and U.S. Dept. of Health and Human Services recommend 150 minutes of moderate exercise, 75 minutes of vigorous exercise, or a combination of both per week. This should be done in episodes at least 10 minutes long, spread throughout the week.  Need help being motivated? 1. Pick something you enjoy doing, because you'll be more inclined to stick with that activity than something that feels like a chore. 2. Do it with a friend so that you are accountable to each  other. 3. Schedule it into your day. Place it on your calendar and keep that appointment just like you do any appointment that you make. 4. Join an exercise group that meets at a specific time.  That way, you have to show up on time, and that makes it harder to procrastinate about doing your workout.  It also keeps you accountable-people begin to expect you to be there. 5. Join a gym where you feel comfortable and not intimidated, at the right cost. 6. Sign up for something that you'll need to be in shape for on a specific date, like a 1K or a 5K to walk or run, a 20 or 30 mile bike ride, a mud run or something like that. If the date is looming, you know you'll need to train to be ready for it.  An added benefit is that many of these are fundraisers for good causes. 7. If you've already paid for a gym membership, group exercise class or event, you might as well work out, so you haven't wasted your money!  

## 2017-12-17 ENCOUNTER — Other Ambulatory Visit: Payer: Self-pay | Admitting: General Surgery

## 2017-12-17 DIAGNOSIS — D509 Iron deficiency anemia, unspecified: Secondary | ICD-10-CM | POA: Diagnosis not present

## 2017-12-17 DIAGNOSIS — Z1211 Encounter for screening for malignant neoplasm of colon: Secondary | ICD-10-CM | POA: Diagnosis not present

## 2017-12-17 DIAGNOSIS — E669 Obesity, unspecified: Secondary | ICD-10-CM | POA: Diagnosis not present

## 2017-12-17 DIAGNOSIS — R911 Solitary pulmonary nodule: Secondary | ICD-10-CM

## 2017-12-17 DIAGNOSIS — Q8909 Congenital malformations of spleen: Secondary | ICD-10-CM

## 2017-12-21 DIAGNOSIS — Z9011 Acquired absence of right breast and nipple: Secondary | ICD-10-CM | POA: Diagnosis not present

## 2017-12-21 DIAGNOSIS — C50512 Malignant neoplasm of lower-outer quadrant of left female breast: Secondary | ICD-10-CM | POA: Diagnosis not present

## 2017-12-23 ENCOUNTER — Ambulatory Visit: Payer: Federal, State, Local not specified - PPO | Admitting: Physical Therapy

## 2017-12-23 DIAGNOSIS — K5730 Diverticulosis of large intestine without perforation or abscess without bleeding: Secondary | ICD-10-CM | POA: Diagnosis not present

## 2017-12-23 DIAGNOSIS — Z1211 Encounter for screening for malignant neoplasm of colon: Secondary | ICD-10-CM | POA: Diagnosis not present

## 2017-12-28 ENCOUNTER — Ambulatory Visit
Admission: RE | Admit: 2017-12-28 | Discharge: 2017-12-28 | Disposition: A | Discharge status: Home / Self Care | Payer: Federal, State, Local not specified - PPO | Source: Ambulatory Visit | Attending: General Surgery | Admitting: General Surgery

## 2017-12-28 DIAGNOSIS — C50912 Malignant neoplasm of unspecified site of left female breast: Secondary | ICD-10-CM | POA: Diagnosis not present

## 2017-12-28 DIAGNOSIS — D259 Leiomyoma of uterus, unspecified: Secondary | ICD-10-CM | POA: Diagnosis not present

## 2017-12-28 DIAGNOSIS — Q8909 Congenital malformations of spleen: Secondary | ICD-10-CM

## 2017-12-28 DIAGNOSIS — R911 Solitary pulmonary nodule: Secondary | ICD-10-CM

## 2017-12-28 MED ORDER — IOPAMIDOL (ISOVUE-300) INJECTION 61%
125.0000 mL | Freq: Once | INTRAVENOUS | Status: AC | PRN
  Administered 2017-12-28: 125 mL via INTRAVENOUS

## 2017-12-29 NOTE — Progress Notes (Signed)
Please let patient know that CTs don't show any evidence of cancer.

## 2017-12-30 ENCOUNTER — Ambulatory Visit: Payer: Federal, State, Local not specified - PPO | Admitting: Physical Therapy

## 2017-12-30 DIAGNOSIS — R600 Localized edema: Secondary | ICD-10-CM | POA: Diagnosis not present

## 2017-12-30 DIAGNOSIS — M25612 Stiffness of left shoulder, not elsewhere classified: Secondary | ICD-10-CM

## 2017-12-30 DIAGNOSIS — C50512 Malignant neoplasm of lower-outer quadrant of left female breast: Secondary | ICD-10-CM | POA: Diagnosis not present

## 2017-12-30 NOTE — Therapy (Signed)
[place], [place], [address] Phone: [phone]   Fax:  [phone]  Physical Therapy Treatment  Patient Details  Name: Briana Price MRN: [id number] Date of Birth: Nov 29, 1965 Referring Provider: Dr. [name]   Encounter Date: 12/30/2017  PT End of Session - 12/30/17 1731    Visit Number  2    Number of Visits  2    PT Start Time  1015    PT Stop Time  1113    PT Time Calculation (min)  58 min    Activity Tolerance  Patient tolerated treatment well    Behavior During Therapy  [facility] for tasks assessed/performed       Past Medical History:  Diagnosis Date  . Anemia   . Breast cancer ([place])   . Cancer [facility])    left breast cancer  . Family history of breast cancer   . Genetic testing 05/28/2017   Common Cancers panel (46 genes) @ [facility] - No pathogenic mutations detected  . GERD (gastroesophageal reflux disease)   . Headache    Migraines  . Hypertension     Past Surgical History:  Procedure Laterality Date  . BREAST SURGERY     biopsy  . CESAREAN SECTION    . CHOLECYSTECTOMY N/A 02/07/2013   Procedure: LAPAROSCOPIC CHOLECYSTECTOMY WITH INTRAOPERATIVE CHOLANGIOGRAM;  Surgeon: Gwenyth Ober, MD;  Location: [place];  Service: General;  Laterality: N/A;  . COLONOSCOPY W/ BIOPSIES AND POLYPECTOMY    . FOOT SURGERY  2003   lt foot bunionectomy  . MASTECTOMY W/ SENTINEL NODE BIOPSY Bilateral 11/19/2017   Procedure: BILATERAL MASTECTOMIES WITH LEFT SENTINEL LYMPH NODE BIOPSY;  Surgeon: [name], MD;  Location: [place];  Service: General;  Laterality: Bilateral;  . MULTIPLE TOOTH EXTRACTIONS    . PORTACATH PLACEMENT Right 05/19/2017   Procedure: INSERTION PORT-A-CATH;  Surgeon: [name], MD;  Location: [place];  Service: General;  Laterality: Right;    There were no vitals filed for this visit.  Subjective Assessment - 12/30/17 1017    Subjective   Did not go to ABC class; had a doctor's appointment that day. Used the foam pad and she thinks the swelling went down some but may have moved a little toward the back. Got bras, but not a compression bra, because the vendor said she would need to have [place] tell them that's what she needs.    Pertinent History  Neo-adjuvant chemotherapy, then double mastectomy on 11/19/17 for cancer in left breast, triple negative, with 2 lymph nodes removed on left, both negative. Won't have radiation; will just have follow-ups now. HTN and on meds, but with variable control. Borderline hyperlipidemia. No other health issues.    Patient Stated Goals  get checked with area of swelling and other post-mastectomy issues    Currently in Pain?  No/denies         [facility] PT Assessment - 12/30/17 0001      AROM   Right Shoulder Flexion  166 Degrees    Right Shoulder ABduction  175 Degrees    Left Shoulder Flexion  159 Degrees    Left Shoulder ABduction  164 Degrees                      [facility] Adult PT Treatment/Exercise - 12/30/17 0001      Self-Care   Other Self-Care Comments   Discussed compression bras.Checked current bra.  Exercises   Exercises  Other Exercises    Other Exercises   Instructed in shoulder stretches from After Breast Cancer Class handout, as well as about progressive strengthening.       Manual Therapy   Manual Therapy  Edema management    Edema Management  checked chest for swelling and induration; remains indurated especially at remaining left breast tissue             PT Education - 12/30/17 1731    Education provided  Yes    Education Details  stretches from After Breast Cancer class and strengthening information; about lymphedema and lymphedema risk reduction    Person(s) Educated  Patient    Methods  Explanation;Handout    Comprehension  Verbalized understanding;Returned demonstration          PT Long Term Goals - 12/30/17 2033      PT LONG TERM GOAL #1    Title  Pt. will be independent in HEP for shoulder ROM.    Status  Achieved      PT LONG TERM GOAL #2   Title  Pt. will be knowledgeable about lymphedema risk reduction practices.    Status  Achieved            Plan - 12/30/17 2030    Clinical Impression Statement  Patient shows improvement in having decreased swelling around incisions (compare photos from this visit and initial evaluation) and improved AROM in both shoulders. Today she was instructed in ROM exercises, in lymphedema risk reduction practices, and in how and where to obtain a compression sleeve if she chooses to get one; also discussed compression bras. Goals have been met.    Rehab Potential  Good    PT Treatment/Interventions  ADLs/Self Care Home Management;DME Instruction;Therapeutic exercise;Patient/family education;Manual techniques;Scar mobilization;Passive range of motion    PT Next Visit Plan  Discharge today. Pt. may come to the free ABC class soon, as she was unable to come to the most recent one.    PT Home Exercise Plan  shoulder stretches, strengthening    Consulted and Agree with Plan of Care  Patient       Patient will benefit from skilled therapeutic intervention in order to improve the following deficits and impairments:  Decreased range of motion, Increased edema, Increased fascial restricitons  Visit Diagnosis: Localized edema  Stiffness of left shoulder, not elsewhere classified     Problem List Patient Active Problem List   Diagnosis Date Noted  . Breast cancer of lower-outer quadrant of left female breast ([place]) 11/19/2017  . Port-A-Cath in place 06/19/2017  . Genetic testing 05/28/2017  . Breast cancer ([place])   . Family history of breast cancer   . Malignant neoplasm of lower-outer quadrant of left breast of female, estrogen receptor negative ([place]) 05/05/2017  . Cholelithiasis 01/11/2013    Odyssey Vasbinder 12/30/2017, 8:34 PM  [place], [place], [address] Phone: [phone]   Fax:  [phone]  Name: Briana Price MRN: [id number] Date of Birth: May 07, 1966  PHYSICAL THERAPY DISCHARGE SUMMARY  Visits from Start of Care: 2  Current functional level related to goals / functional outcomes: Goals met as noted above. Nice improvements in ROM and decreased swelling around incisions.   Remaining deficits: Mild shoulder ROM deficits and still healing after surgery.   Education / Equipment: Stretching, strengthening, lymphedema risk reduction practices. Plan: Patient agrees to discharge.  Patient goals were met. Patient is being discharged due to  meeting the stated rehab goals.  ?????    Serafina Royals, PT 12/30/17 8:36 PM

## 2018-01-14 ENCOUNTER — Inpatient Hospital Stay: Payer: Federal, State, Local not specified - PPO | Attending: Hematology

## 2018-01-14 DIAGNOSIS — C50512 Malignant neoplasm of lower-outer quadrant of left female breast: Secondary | ICD-10-CM | POA: Diagnosis not present

## 2018-01-14 DIAGNOSIS — Z95828 Presence of other vascular implants and grafts: Secondary | ICD-10-CM

## 2018-01-14 DIAGNOSIS — Z452 Encounter for adjustment and management of vascular access device: Secondary | ICD-10-CM | POA: Diagnosis not present

## 2018-01-14 DIAGNOSIS — Z9221 Personal history of antineoplastic chemotherapy: Secondary | ICD-10-CM | POA: Insufficient documentation

## 2018-01-14 DIAGNOSIS — Z9013 Acquired absence of bilateral breasts and nipples: Secondary | ICD-10-CM | POA: Diagnosis not present

## 2018-01-14 DIAGNOSIS — Z171 Estrogen receptor negative status [ER-]: Secondary | ICD-10-CM | POA: Insufficient documentation

## 2018-01-14 DIAGNOSIS — C773 Secondary and unspecified malignant neoplasm of axilla and upper limb lymph nodes: Secondary | ICD-10-CM | POA: Insufficient documentation

## 2018-01-14 MED ORDER — HEPARIN SOD (PORK) LOCK FLUSH 100 UNIT/ML IV SOLN
500.0000 [IU] | Freq: Once | INTRAVENOUS | Status: AC | PRN
  Administered 2018-01-14: 500 [IU] via INTRAVENOUS
  Filled 2018-01-14: qty 5

## 2018-01-14 MED ORDER — SODIUM CHLORIDE 0.9% FLUSH
10.0000 mL | INTRAVENOUS | Status: DC | PRN
  Administered 2018-01-14: 10 mL via INTRAVENOUS
  Filled 2018-01-14: qty 10

## 2018-02-15 ENCOUNTER — Ambulatory Visit (HOSPITAL_COMMUNITY)
Admission: RE | Admit: 2018-02-15 | Discharge: 2018-02-15 | Disposition: A | Discharge status: Home / Self Care | Payer: Federal, State, Local not specified - PPO | Source: Ambulatory Visit | Attending: Internal Medicine | Admitting: Internal Medicine

## 2018-02-15 ENCOUNTER — Ambulatory Visit (HOSPITAL_BASED_OUTPATIENT_CLINIC_OR_DEPARTMENT_OTHER)
Admission: RE | Admit: 2018-02-15 | Discharge: 2018-02-15 | Disposition: A | Discharge status: Home / Self Care | Payer: Federal, State, Local not specified - PPO | Source: Ambulatory Visit | Attending: Internal Medicine | Admitting: Internal Medicine

## 2018-02-15 VITALS — BP 144/96 | HR 74 | Wt 219.1 lb

## 2018-02-15 DIAGNOSIS — Z9013 Acquired absence of bilateral breasts and nipples: Secondary | ICD-10-CM | POA: Insufficient documentation

## 2018-02-15 DIAGNOSIS — C50512 Malignant neoplasm of lower-outer quadrant of left female breast: Secondary | ICD-10-CM

## 2018-02-15 DIAGNOSIS — Z171 Estrogen receptor negative status [ER-]: Secondary | ICD-10-CM | POA: Insufficient documentation

## 2018-02-15 DIAGNOSIS — Z8249 Family history of ischemic heart disease and other diseases of the circulatory system: Secondary | ICD-10-CM | POA: Insufficient documentation

## 2018-02-15 DIAGNOSIS — I10 Essential (primary) hypertension: Secondary | ICD-10-CM | POA: Insufficient documentation

## 2018-02-15 DIAGNOSIS — Z79899 Other long term (current) drug therapy: Secondary | ICD-10-CM | POA: Diagnosis not present

## 2018-02-15 DIAGNOSIS — Z87891 Personal history of nicotine dependence: Secondary | ICD-10-CM | POA: Insufficient documentation

## 2018-02-15 DIAGNOSIS — Z9221 Personal history of antineoplastic chemotherapy: Secondary | ICD-10-CM | POA: Insufficient documentation

## 2018-02-15 DIAGNOSIS — C50912 Malignant neoplasm of unspecified site of left female breast: Secondary | ICD-10-CM | POA: Insufficient documentation

## 2018-02-15 DIAGNOSIS — Z803 Family history of malignant neoplasm of breast: Secondary | ICD-10-CM | POA: Diagnosis not present

## 2018-02-15 DIAGNOSIS — D649 Anemia, unspecified: Secondary | ICD-10-CM | POA: Insufficient documentation

## 2018-02-15 NOTE — Progress Notes (Signed)
CARDIO-ONCOLOGY CLINIC CONSULT NOTE  Referring Physician: Dr. Burr Medico   HPI:  Briana Price is 52 y.o. female with h/o HTN and triple negative left breast cancer diagnoses in 8/18 who is referred by Dr. Burr Medico  for enrollment into the Cardio-Oncology program.   Pathology: Stage IIB (cT2, cN0(f), cM0, G3, ER: Negative, PR: Negative, HER2: Negative)   Chemotherapy regimen:  1) Neoadjuvant chemotherpy AC every 2 weeks for 4 cycles starting 05/22/17 and completed on 07/07/17, followed by weekly paclitaxel for 12 cycles starting 07/24/17 completed 2/19  S/p bilateral mastectomies 3/19.   Doing well. Back to work. No SOB or swelling. No palpitations. Awaiting breast reconstruction  ECHO 6/19 EF 65% RV normal Grade I DD GLS -19.9 LS" 9.8 cm/s ECHO 08/14/17: EF 65% RV normal. Grade I DD Strain ~17.0 (appears underestimated) Reviewed personally.   Past Medical History:  Diagnosis Date  . Anemia   . Breast cancer ([place])   . Cancer [facility])    left breast cancer  . Family history of breast cancer   . Genetic testing 05/28/2017   Common Cancers panel (46 genes) @ [facility] - No pathogenic mutations detected  . GERD (gastroesophageal reflux disease)   . Headache    Migraines  . Hypertension     Current Outpatient Medications  Medication Sig Dispense Refill  . aspirin-acetaminophen-caffeine (EXCEDRIN MIGRAINE) 250-250-65 MG tablet Take 2 tablets by mouth 2 (two) times daily as needed for headache.    Marland Kitchen atorvastatin (LIPITOR) 20 MG tablet Take 20 mg by mouth daily.  1  . Cholecalciferol (VITAMIN D) 2000 units CAPS Take 2,000 Units by mouth daily.    . ferrous sulfate 325 (65 FE) MG tablet Take 325 mg by mouth every other day.     . fluocinonide ointment (LIDEX) 1.97 % Apply 1 application topically daily as needed (rosacea).    . hydrochlorothiazide (HYDRODIURIL) 25 MG tablet Take 25 mg by mouth daily.    Marland Kitchen lisinopril (PRINIVIL,ZESTRIL) 10 MG tablet Take 10 mgs by mouth daily  2  . Multiple  Vitamins-Minerals (MULTIVITAMIN ADULT PO) Take 1 tablet by mouth daily.    . ondansetron (ZOFRAN) 8 MG tablet Take 8 mg by mouth every 8 (eight) hours as needed for nausea or vomiting.    . gabapentin (NEURONTIN) 300 MG capsule Take 1 capsule (300 mg total) by mouth 2 (two) times daily. (Patient not taking: Reported on 12/16/2017) 30 capsule 1  . methocarbamol (ROBAXIN) 500 MG tablet Take 1 tablet (500 mg total) by mouth every 8 (eight) hours as needed for muscle spasms. (Patient not taking: Reported on 12/16/2017) 30 tablet 1  . oxyCODONE (OXY IR/ROXICODONE) 5 MG immediate release tablet Take 1 tablet (5 mg total) by mouth once as needed (for pain score of 1-4). (Patient not taking: Reported on 12/16/2017) 30 tablet 0   No current facility-administered medications for this encounter.    Facility-Administered Medications Ordered in Other Encounters  Medication Dose Route Frequency Provider Last Rate Last Dose  . sodium chloride flush (NS) 0.9 % injection 10 mL  10 mL Intravenous PRN [name], MD   10 mL at 08/07/17 1354    No Known Allergies    Social History   Socioeconomic History  . Marital status: Single    Spouse name: Not on file  . Number of children: Not on file  . Years of education: Not on file  . Highest education level: Not on file  Occupational History  . Not on file  Social  Needs  . Financial resource strain: Not on file  . Food insecurity:    Worry: Not on file    Inability: Not on file  . Transportation needs:    Medical: Not on file    Non-medical: Not on file  Tobacco Use  . Smoking status: Former Smoker    Types: Cigarettes  . Smokeless tobacco: Never Used  . Tobacco comment: 15-20 years ago  Substance and Sexual Activity  . Alcohol use: No  . Drug use: No  . Sexual activity: Not on file  Lifestyle  . Physical activity:    Days per week: Not on file    Minutes per session: Not on file  . Stress: Not on file  Relationships  . Social connections:     Talks on phone: Not on file    Gets together: Not on file    Attends religious service: Not on file    Active member of club or organization: Not on file    Attends meetings of clubs or organizations: Not on file    Relationship status: Not on file  . Intimate partner violence:    Fear of current or ex partner: Not on file    Emotionally abused: Not on file    Physically abused: Not on file    Forced sexual activity: Not on file  Other Topics Concern  . Not on file  Social History Narrative   Tobacco use   Cigarettes: Never smoked    Tobacco history last updated 01/23/2014   No smoking   No tobacco exposure   No alcohol   Caffeine : yes soda   No recreational drug use   Occupation :employed ,[employer] 01/25/2013   Martial status : single    Children: [employer]ie Casa 01/25/2013   92 year old son      Family History  Problem Relation Age of Onset  . Breast cancer Maternal Grandmother        dx 81s; deceased 63  . Breast cancer Paternal Aunt 26       recurrence vs. 2nd primary at 26; currently 18  . Breast cancer Maternal Aunt 62       deceased 4  . Hypertension Mother   . Breast cancer Other        pat grandfather's sister; dx 24s  . Leukemia Paternal Uncle        deceased 71  . Breast cancer Other        paternal distant cousin; dx 8s    Vitals:   02/15/18 1358  BP: (!) 144/96  Pulse: 74  SpO2: 98%  Weight: 219 lb 1.9 oz (99.4 kg)    PHYSICAL EXAM: General:  Well appearing. No resp difficulty HEENT: normal Neck: supple. no JVD. Carotids 2+ bilat; no bruits. No lymphadenopathy or thryomegaly appreciated. Cor: PMI nondisplaced. Regular rate & rhythm. No rubs, gallops or murmurs. R-sided port Lungs: clear Abdomen: obese soft, nontender, nondistended. No hepatosplenomegaly. No bruits or masses. Good bowel sounds. Extremities: no cyanosis, clubbing, rash, edema Neuro: alert & orientedx3, cranial nerves grossly intact. moves all 4 extremities w/o difficulty.  Affect pleasant    ASSESSMENT & PLAN: 1. Left Breast Cancer - Triple negative. Stage IIB - Has completed 4 cycles of AC chemo in 10/18. Now s/p bilateral mastectomies in 3/19  - Echo today EF 65% no evidence cardiotoxicity at 8 months post Adriamycin therapy - We discussed the very small possibility of very delayed toxicity.   2. HTN -  I have reviewed BPs in Flowsheet. BPs have been very labile. SBP ranges 95-189. Given LVH on echo need to treat HTN aggressively. I have asked her to keep BP log at home and share it with her PCP. Goal SBP < 135.  Can increase lisinopril as needed   Glori Bickers, MD  2:15 PM

## 2018-02-15 NOTE — Patient Instructions (Signed)
Your physician recommends that you schedule a follow-up appointment in: As needed  Keep BP log daily and give to PCP

## 2018-02-15 NOTE — Progress Notes (Signed)
  Echocardiogram 2D Echocardiogram has been performed.  Briana Price 02/15/2018, 1:38 PM

## 2018-03-02 NOTE — Progress Notes (Signed)
[facility]  [phone]) [phone] [phone]) [phone]  Clinic follow up Note   Patient Care Team: [name], MD as PCP - General (Family Medicine) [name], MD as Consulting Physician (Hematology) [name], MD as Consulting Physician (General Surgery) [name], MD as Consulting Physician (Radiation Oncology)   Date of Service:  03/05/2018   CHIEF COMPLAINTS:  Follow up Malignant neoplasm of lower-outer quadrant of left breast of female, triple negative   Oncology History   Cancer Staging Malignant neoplasm of lower-outer quadrant of left breast of female, estrogen receptor negative ([facility]) Staging form: Breast, AJCC 8th Edition - Clinical: Stage IIB (cT2, cN0(f), cM0, G3, ER: Negative, PR: Negative, HER2: Negative) - Unsigned       Malignant neoplasm of lower-outer quadrant of left breast of female, estrogen receptor negative ([facility])   04/27/2017 Mammogram    IMPRESSION: 1. Highly suspicious mass within the LEFT breast at the 5:30 o'clock axis, 4 cm from the nipple, measuring 2.5 cm, corresponding to the mammographic finding and corresponding to 1 of the palpable lumps in the left breast. Ultrasound-guided biopsy is recommended. 2. Mildly prominent lymph node in the LEFT axilla, with normal cortical thickness but with questionable effacement of the fatty hilum. Ultrasound-guided core biopsy is recommended. 3. No evidence of malignancy within the RIGHT breast. Also, no evidence of malignancy within the second area of clinical concern in the upper outer left breast.      04/29/2017 Initial Diagnosis    Malignant neoplasm of lower-outer quadrant of left breast of female, estrogen receptor negative ([facility])      04/29/2017 Receptors her2    Estrogen Receptor: 0%, NEGATIVE Progesterone Receptor: 0%, NEGATIVE Proliferation Marker Ki67: 90% HER2 NEGATIVE       04/29/2017 Initial Biopsy     Diagnosis 1. 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 - SEE COMMENT. 2. Lymph node, needle/core biopsy, left axilla - THERE IN NO EVIDENCE OF CARCINOMA IN 1 OF 1 LYMPH NODE (0/1).      05/14/2017 Imaging    MRI of Breast Bilateral 05/14/17  IMPRESSION: 1. 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 2. Non mass enhancement extending approximately 3.2 cm anterior to the mass in the lower inner quadrant of the left breast, suspicious for DCIS. There is no correlate on recent mammography. The overall extent of the mass and non mass enhancement is approximately 5 cm. 3. No MRI evidence of malignancy involving the right breast. 4. No pathologic lymphadenopathy. Upper normal sized left axillary lymph nodes, the largest of which was previously biopsied no evidence of metastatic disease.      05/15/2017 Imaging    Bone scan whole body 05/15/17 IMPRESSION: No findings specific for metastatic disease to bone.      05/15/2017 Imaging    CT CAP W contrast 05/15/17  IMPRESSION: 1. 18 mm soft tissue lesion inferior left breast compatible with known primary malignancy. 2. Mildly enlarged left axillary lymph nodes in this patient with biopsy-proven metastatic disease to the left axilla. 3. No evidence for lymphadenopathy elsewhere in the chest. No lymphadenopathy in the abdomen or pelvis. No evidence for metastatic disease in the abdomen or pelvis. 4. Tiny right perifissural lung nodule most likely subpleural lymph node. Attention on follow-up recommended. 5. 7 mm hypoattenuating lesion in the spleen, likely a cyst or pseudocyst. Attention on follow-up  recommended. 6. Uterine fibroids. 7.  Aortic Atherosclerois (ICD10-170.0)      05/19/2017 Procedure    Port placement by Dr. [name] on 9/11/8      05/22/2017 - 10/09/2017 Chemotherapy     neoadjuvant chemotherpy AC every  2 weeks for 4 cycles starting 05/22/17 and completed on 07/07/17, followed by weekly paclitaxel for 12 cycles starting 07/24/17 to complete on 10/09/17       05/22/2017 Pathology Results    Diagnosis Breast, left, needle core biopsy, lower inner quad - DUCTAL CARCINOMA IN SITU. 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  The genes analyzed were the 46 genes on [facility]'s Common Cancers panel (APC, ATM, AXIN2, BARD1, BMPR1A, BRCA1, BRCA2, BRIP1, CDH1, CDKN2A, CHEK2, CTNNA1, DICER1, EPCAM, GREM1, HOXB13, KIT, MEN1, MLH1, MSH2, MSH3, MSH6, MUTYH, NBN, NF1, NTHL1, PALB2, PDGFRA, PMS2, POLD1, POLE, PTEN, RAD50, RAD51C, RAD51D, SDHA, SDHB, SDHC, SDHD, SMAD4, SMARCA4, STK11, TP53, TSC1, TSC2, VHL).        08/14/2017 Echocardiogram    EF 60-65%  Study Conclusions  - Left ventricle: The cavity size was normal. Systolic function was   normal. The estimated ejection fraction was in the range of 60%   to 65%. Wall motion was normal; there were no regional wall   motion abnormalities. Doppler parameters are consistent with   abnormal left ventricular relaxation (grade 1 diastolic   dysfunction). - Pulmonary arteries: PA peak pressure: 31 mm Hg (S).  Impressions:  - No change from September 2018 study (strain rate not performed on   that study).         10/14/2017 Imaging    MRI Breast Bilateral  IMPRESSION:  Significant improvement following neoadjuvant treatment.  No residual enhancement in the left breast at sites of known malignancy.  RECOMMENDATION: Treatment plan for known malignancy.      11/19/2017 Surgery    BILATERAL MASTECTOMIES WITH LEFT SENTINEL LYMPH NODE BIOPSY by Dr. [name] 11/19/17      11/19/2017 Pathology Results     Diagnosis 11/19/17 1. Breast, simple mastectomy, Right - FIBROCYSTIC AND FIBROADENOMATOID CHANGE. - NO MALIGNANCY  IDENTIFIED. 2. Breast, simple mastectomy, Left - NO RESIDUAL CARCINOMA IDENTIFIED. - BIOPSY SITES. - FIBROCYSTIC AND FIBROADENOMATOID CHANGE. - SEE ONCOLOGY TABLE. 3. Lymph node, sentinel, biopsy, Left Axillary #1 - ONE OF ONE LYMPH NODES NEGATIVE FOR CARCINOMA (0/1). - BIOPSY SITE. 4. Lymph node, sentinel, biopsy, Left Axillary # 2 - ONE OF ONE LYMPH NODES NEGATIVE FOR CARCINOMA (0/1).      12/28/2017 Imaging    CT CAP W Contrast 12/28/17 IMPRESSION: No evidence of recurrent or metastatic carcinoma. No other acute findings. 3.5 cm right uterine fibroid. Colonic diverticulosis, without radiographic evidence of diverticulitis.      HISTORY OF PRESENTING ILLNESS: 8/29/8  [name] 52 y.o. 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  She presents to the clinic today with her mother and father.   She first noticed her a mass in her left breast on the 17th of August, It was a hard knot, no pain or tenderness. Her mammogram form 03/24/17 and other previous mammograms were all normal.   In the past she was diagnosed with HTN and takes hydrochlorothiazide and lisinopril. She had her gallbladder removed. Her maternal grandmother, maternal aunt and paternal aunt had breast cancer. She was told she has arthritis in her  back. The pain can last up to 3 days.   Today she denies skin change around breast, pain, fatigue or weight loss. She still has regular periods that last 3 days. She work in [employer] for the [employer].   GYN HISTORY  Menarchal: 14 LMP: 05/01/17 Contraceptive: No HRT: n/a GP: G1P1 at age 74, she has a son  CURRENT THERAPY: surveillance   INTERVAL HISTORY:  MEAGON DUSKIN is here for a follow up for her breast cancer. She presents to the clinic today accompanied by her parents. She notes she plans to see plastic surgeons about reconstruction surgery. She notes recent opening of surgical incision in right breast. She is overall  healing well with no pain or concerns. She notes is recovering well from treatment with occasional tingling in hands and feet. She notes some skin itching but overall tolerable.      MEDICAL HISTORY:  Past Medical History:  Diagnosis Date  . Anemia   . Breast cancer ([place])   . Cancer [facility])    left breast cancer  . Family history of breast cancer   . Genetic testing 05/28/2017   Common Cancers panel (46 genes) @ [facility] - No pathogenic mutations detected  . GERD (gastroesophageal reflux disease)   . Headache    Migraines  . Hypertension    SURGICAL HISTORY: Past Surgical History:  Procedure Laterality Date  . BREAST SURGERY     biopsy  . CESAREAN SECTION    . CHOLECYSTECTOMY N/A 02/07/2013   Procedure: LAPAROSCOPIC CHOLECYSTECTOMY WITH INTRAOPERATIVE CHOLANGIOGRAM;  Surgeon: Gwenyth Ober, MD;  Location: [place];  Service: General;  Laterality: N/A;  . COLONOSCOPY W/ BIOPSIES AND POLYPECTOMY    . FOOT SURGERY  2003   lt foot bunionectomy  . MASTECTOMY W/ SENTINEL NODE BIOPSY Bilateral 11/19/2017   Procedure: BILATERAL MASTECTOMIES WITH LEFT SENTINEL LYMPH NODE BIOPSY;  Surgeon: [name], MD;  Location: [place];  Service: General;  Laterality: Bilateral;  . MULTIPLE TOOTH EXTRACTIONS    . PORTACATH PLACEMENT Right 05/19/2017   Procedure: INSERTION PORT-A-CATH;  Surgeon: [name], MD;  Location: [place];  Service: General;  Laterality: Right;   SOCIAL HISTORY: Social History   Socioeconomic History  . Marital status: Single    Spouse name: Not on file  . Number of children: Not on file  . Years of education: Not on file  . Highest education level: Not on file  Occupational History  . Not on file  Social Needs  . Financial resource strain: Not on file  . Food insecurity:    Worry: Not on file    Inability: Not on file  . Transportation needs:    Medical: Not on file    Non-medical: Not on file  Tobacco Use  . Smoking status:  Former Smoker    Types: Cigarettes  . Smokeless tobacco: Never Used  . Tobacco comment: 15-20 years ago  Substance and Sexual Activity  . Alcohol use: No  . Drug use: No  . Sexual activity: Not on file  Lifestyle  . Physical activity:    Days per week: Not on file    Minutes per session: Not on file  . Stress: Not on file  Relationships  . Social connections:    Talks on phone: Not on file    Gets together: Not on file    Attends religious service: Not on file    Active member of club or organization: Not on file  Attends meetings of clubs or organizations: Not on file    Relationship status: Not on file  . Intimate partner violence:    Fear of current or ex partner: Not on file    Emotionally abused: Not on file    Physically abused: Not on file    Forced sexual activity: Not on file  Other Topics Concern  . Not on file  Social History Narrative   Tobacco use   Cigarettes: Never smoked    Tobacco history last updated 01/23/2014   No smoking   No tobacco exposure   No alcohol   Caffeine : yes soda   No recreational drug use   Occupation :employed ,[employer] 01/25/2013   Martial status : single    Children: Louie Casa 01/25/2013   64 year old son   FAMILY HISTORY: Family History  Problem Relation Age of Onset  . Breast cancer Maternal Grandmother        dx 77s; deceased 20  . Breast cancer Paternal Aunt 24       recurrence vs. 2nd primary at 42; currently 9  . Breast cancer Maternal Aunt 62       deceased 72  . Hypertension Mother   . Breast cancer Other        pat grandfather's sister; dx 35s  . Leukemia Paternal Uncle        deceased 8  . Breast cancer Other        paternal distant cousin; dx 72s   ALLERGIES:  has No Known Allergies.  MEDICATIONS:  Current Outpatient Medications  Medication Sig Dispense Refill  . aspirin-acetaminophen-caffeine (EXCEDRIN MIGRAINE) 250-250-65 MG tablet Take 2 tablets by mouth 2 (two) times daily as needed for  headache.    Marland Kitchen atorvastatin (LIPITOR) 20 MG tablet Take 20 mg by mouth daily.  1  . Cholecalciferol (VITAMIN D) 2000 units CAPS Take 2,000 Units by mouth daily.    . ferrous sulfate 325 (65 FE) MG tablet Take 325 mg by mouth every other day.     . fluocinonide ointment (LIDEX) 0.62 % Apply 1 application topically daily as needed (rosacea).    . hydrochlorothiazide (HYDRODIURIL) 25 MG tablet Take 25 mg by mouth daily.    Marland Kitchen lisinopril (PRINIVIL,ZESTRIL) 10 MG tablet Take 10 mgs by mouth daily  2  . Multiple Vitamins-Minerals (MULTIVITAMIN ADULT PO) Take 1 tablet by mouth daily.    . methocarbamol (ROBAXIN) 500 MG tablet Take 1 tablet (500 mg total) by mouth every 8 (eight) hours as needed for muscle spasms. (Patient not taking: Reported on 12/16/2017) 30 tablet 1  . ondansetron (ZOFRAN) 8 MG tablet Take 8 mg by mouth every 8 (eight) hours as needed for nausea or vomiting.     No current facility-administered medications for this visit.    Facility-Administered Medications Ordered in Other Visits  Medication Dose Route Frequency Provider Last Rate Last Dose  . sodium chloride flush (NS) 0.9 % injection 10 mL  10 mL Intravenous PRN [name], MD   10 mL at 08/07/17 1354   REVIEW OF SYSTEMS:   Constitutional: Denies fevers, chills or abnormal night sweats  Eyes: Denies blurriness of vision, double vision or watery eyes Ears, nose, mouth, throat, and face: Denies mucositis or sore throat Respiratory: Denies cough, dyspnea or wheezes Cardiovascular: Denies palpitation, chest discomfort or lower extremity swelling (+) mild heart murmur Gastrointestinal:  Denies nausea, heartburn or change in bowel habits Skin: Denies abnormal skin rashes (+) skin itching  and nail discoloration Lymphatics: Denies new lymphadenopathy or easy bruising Neurological:Denies new weaknesses (+) occasional residual neuropathy in fingers Behavioral/Psych: Mood is stable, no new changes. Breast (+) right breast incision with  small opening.  All other systems were reviewed with the patient and are negative.  PHYSICAL EXAMINATION: ECOG PERFORMANCE STATUS: 0 BP (!) 147/90 (BP Location: Left Arm, Patient Position: Sitting) Comment: nurse aware of bp  Pulse 77   Temp 98.7 F (37.1 C) (Oral)   Resp 17   Ht '5\' 6"'$  (1.676 m)   Wt 222 lb 8 oz (100.9 kg)   SpO2 100%   BMI 35.91 kg/m   Vitals:   03/05/18 0950  BP: (!) 147/90  Pulse: 77  Resp: 17  Temp: 98.7 F (37.1 C)  TempSrc: Oral  SpO2: 100%  Weight: 222 lb 8 oz (100.9 kg)  Height: '5\' 6"'$  (1.676 m)     GENERAL:alert, no distress and comfortable SKIN: skin color, texture, turgor are normal, no rashes or significant lesions EYES: normal, conjunctiva are pink and non-injected, sclera clear OROPHARYNX:no exudate, no erythema and lips, buccal mucosa, and tongue normal  NECK: supple, thyroid normal size, non-tender, without nodularity LYMPH:  no palpable lymphadenopathy in the cervical, axillary or inguinal LUNGS: clear to auscultation and percussion with normal breathing effort HEART: regular rate and no lower extremity edema (+) mild Heart murmur, related to anemia  ABDOMEN:abdomen soft, non-tender and normal bowel sounds Musculoskeletal:no cyanosis of digits and no clubbing  PSYCH: alert & oriented x 3 with fluent speech NEURO: no focal motor/sensory deficits Breast: S/p b/l mastectomy:  (+) Surgical incisions have healed very well.  No palpable mass on the chest wall or axillas.   LABORATORY DATA:  I have reviewed the data as listed CBC Latest Ref Rng & Units 03/05/2018 12/03/2017 11/19/2017  WBC 3.9 - 10.3 K/uL 6.5 6.4 -  Hemoglobin 11.6 - 15.9 g/dL 12.5 11.2(L) 10.5(L)  Hematocrit 34.8 - 46.6 % 37.8 34.0(L) 31.0(L)  Platelets 145 - 400 K/uL 295 418(H) -   CMP Latest Ref Rng & Units 12/03/2017 11/19/2017 10/29/2017  Glucose 70 - 140 mg/dL 103 229(H) 129  BUN 7 - 26 mg/dL 5(L) 7 6(L)  Creatinine 0.60 - 1.10 mg/dL 0.66 0.50 0.72  Sodium 136 - 145  mmol/L 139 135 142  Potassium 3.5 - 5.1 mmol/L 3.7 3.9 4.0  Chloride 98 - 109 mmol/L 103 100(L) 103  CO2 22 - 29 mmol/L 29 - 30(H)  Calcium 8.4 - 10.4 mg/dL 10.2 - 10.1  Total Protein 6.4 - 8.3 g/dL 7.2 - 7.4  Total Bilirubin 0.2 - 1.2 mg/dL 0.7 - 0.6  Alkaline Phos 40 - 150 U/L 65 - 67  AST 5 - 34 U/L 25 - 27  ALT 0 - 55 U/L 22 - 31   PATHOLOGY  Diagnosis 11/19/17 1. Breast, simple mastectomy, Right - FIBROCYSTIC AND FIBROADENOMATOID CHANGE. - NO MALIGNANCY IDENTIFIED. 2. Breast, simple mastectomy, Left - NO RESIDUAL CARCINOMA IDENTIFIED. - BIOPSY SITES. - FIBROCYSTIC AND FIBROADENOMATOID CHANGE. - SEE ONCOLOGY TABLE. 3. Lymph node, sentinel, biopsy, Left Axillary #1 - ONE OF ONE LYMPH NODES NEGATIVE FOR CARCINOMA (0/1). - BIOPSY SITE. 4. Lymph node, sentinel, biopsy, Left Axillary # 2 - ONE OF ONE LYMPH NODES NEGATIVE FOR CARCINOMA (0/1). Microscopic Comment 2. BREAST, STATUS POST NEOADJUVANT TREATMENT Procedure: Left simple mastectomy and left axillary sentinel lymph node biopsies. Laterality: Left. Tumor Size: No residual tumor. Histologic Type: No residual tumor. Prior biopsy with invasive ductal carcinoma. Grade: No  residual tumor. Prior biopsy grade 2-3. Ductal Carcinoma in Situ (DCIS): Not identified. Regional Lymph Nodes: Number of Lymph Nodes Examined: 2 Number of Sentinel Lymph Nodes Examined: 2 Lymph Nodes with Macrometastases: 0 Lymph Nodes with Micrometastases: 0 Lymph Nodes with Isolated Tumor Cells: 0 Margins: Invasive carcinoma, distance from closest margin: N/A. DCIS, distance from closest margin: N/A. 1 of 3 FINAL for ZNIYA, COTTONE [phone]) Microscopic Comment(continued) Extent of Tumor: N/A. Breast Prognostic Profile (pre-neoadjuvant case #: [id number]) Estrogen Receptor: Negative. Progesterone Receptor: Negative. Her2: Negative. Ki-67: 90% Residual Cancer Burden (RCB): Complete pathologic response. Pathologic Stage Classification (p  TNM, AJCC 8th Edition): Primary Tumor (ypT): ypT0 Regional Lymph Nodes (ypN): ypN0  Diagnosis 04/29/17 1. Breast, left, needle core biopsy, 5:30 o'clock, mass - INVASIVE DUCTAL CARCINOMA. - DUCTAL CARCINOMA IN SITU. - SEE COMMENT. 2. Lymph node, needle/core biopsy, left axilla - THERE IN NO EVIDENCE OF CARCINOMA IN 1 OF 1 LYMPH NODE (0/1). Microscopic Comment 1. The carcinoma appears grade 2-3. A breast prognostic profile will be performed and the results reported separately. The results were called to [facility] on 04/30/17. (JBK:gt, 04/30/17) 1. PROGNOSTIC INDICATORS Results: IMMUNOHISTOCHEMICAL AND MORPHOMETRIC ANALYSIS PERFORMED MANUALLY Estrogen Receptor: 0%, NEGATIVE Progesterone Receptor: 0%, NEGATIVE Proliferation Marker Ki67: 90% COMMENT: The negative hormone receptor study(ies) in this case has an internal positive control. 1. FLUORESCENCE IN-SITU HYBRIDIZATION Results: HER2 - NEGATIVE RATIO OF HER2/CEP17 SIGNALS 1.11 AVERAGE HER2 COPY NUMBER PER CELL 2.1    PROCEDURES   ECHO 05/14/17 Study Conclusions - Left ventricle: The cavity size was normal. Systolic function was normal. The estimated ejection fraction was in the range of 55% to 60%. Wall motion was normal; there were no regional wall motion abnormalities. Left ventricular diastolic function parameters were normal.  RADIOGRAPHIC STUDIES: I have personally reviewed the radiological images as listed and agreed with the findings in the report.  CT CAP W Contrast 12/28/17 IMPRESSION: No evidence of recurrent or metastatic carcinoma. No other acute findings. 3.5 cm right uterine fibroid. Colonic diverticulosis, without radiographic evidence of diverticulitis.   Bone scan whole body 05/15/17 IMPRESSION: No findings specific for metastatic disease to bone.  CT CAP W contrast 05/15/17  IMPRESSION: 1. 18 mm soft tissue lesion inferior left breast compatible with known primary malignancy. 2.  Mildly enlarged left axillary lymph nodes in this patient with biopsy-proven metastatic disease to the left axilla. 3. No evidence for lymphadenopathy elsewhere in the chest. No lymphadenopathy in the abdomen or pelvis. No evidence for metastatic disease in the abdomen or pelvis. 4. Tiny right perifissural lung nodule most likely subpleural lymph node. Attention on follow-up recommended. 5. 7 mm hypoattenuating lesion in the spleen, likely a cyst or pseudocyst. Attention on follow-up recommended. 6. Uterine fibroids. 7.  Aortic Atherosclerois (ICD10-170.0)  Diagnostic Mammogram 04/27/17 IMPRESSION: 1. Highly suspicious mass within the LEFT breast at the 5:30 o'clock axis, 4 cm from the nipple, measuring 2.5 cm, corresponding to the mammographic finding and corresponding to 1 of the palpable lumps in the left breast. Ultrasound-guided biopsy is recommended. 2. Mildly prominent lymph node in the LEFT axilla, with normal ortical thickness but with questionable effacement of the fatty hilum. Ultrasound-guided core biopsy is recommended. 3. No evidence of malignancy within the RIGHT breast. Also, no evidence of malignancy within the second area of clinical concern in the upper outer left breast.  No results found.  ASSESSMENT & PLAN:  VERA FURNISS is a 52 y.o. african-american female with a history of HTN  and arthritis in her back, presented with a palpable left breast mass.  1. 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 ypT0N0 -She presented with a palpable left breast mass, measures 2.5 cm on ultrasound, with a prominent lymph node. 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 -Her staging CT scan and bone scan findings had no evidence of distant metastasis. -she received neoadjuvant chemotherapy with dose dense ACX4  followed by weekly taxol X12 05/22/17-10/09/17 -She underwent B/l mastectomy with left sentinel lymph node biopsy on 11/19/17. We discussed her pathology results that showed complete pathological response to neoadjuvant treatment, 2 nodes negative.  -Given her results, she is no longer eligible for the [facility] trail due to lack of residual disease and she does not need adjuvant chemotherapy such as Xeloda.  -She plans to undergo reconstruction surgery and she will see her Psychiatric nurse at [facility]. She can get her port removed with reconstruction and will flush port every 6 weeks until then.  -I previously discussed cancer surveillance with pt. Will follow her for 5 years with regular visits and labs every 4-6 months for the first 2-3 years then yearly. No need for mammograms.  -She is recovering well from treatments, she has occasional tingling in her hands and feet, nearly resolved and nail discoloration which will improve.  -She is clinically doing well. Lab reviewed, her CBC is WNL, and CMP still pending. Her physical exam is unremarkable. There is no clinical concern for recurrence. Continue breast cancer surveillance.  -She is s/p b/l mastectomy, no need for repeat mammograms.  -F/u with me and Dr. [name] every 3-6 months, I advised her to stagger the appointments.    2. 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  3. HTN -she is on HCTZ and lisinopril , we'll continue, she will follow-up with her primary care physician  -We previously discussed the impact of chemotherapy, blood pressure, and a possibility that we may hold her blood pressure medication if she gets dehydrated.  -BP improved to 147/90 (03/05/18)  4. Rosacea -Dermatologist  prescribed topical steroid and she will also receive IV steroid which may help control  5. Lung nodules, Subcapsular spleen cyst  -Her restaging CT scan showed a 76m subcapsular cyst and tiny right lung nodule, 1-2 mm, likely benign, follow-up recommended.  She never smoked, low risk for lung cancer. -12/28/17 CT CAP showed stable cyst and nodule, no further surveillance scan is needed.     PLAN:  -Lab and f/u in 6 months, she will see Dr. BBarry Dienesin 2-3 months -port flush every 6 weeks until removal    No orders of the defined types were placed in this encounter.  All questions were answered. The patient knows to call the clinic with any problems, questions or concerns. I spent 20 minutes counseling the patient face to face. The total time spent in the appointment was 25 minutes and more than 50% was on counseling.  IOneal Deputy am acting as scribe for YTruitt Merle MD.   I have reviewed the above documentation for accuracy and completeness, and I agree with the above.      YKrista Blue  Burr Medico, MD 03/05/2018

## 2018-03-04 ENCOUNTER — Telehealth: Payer: Self-pay

## 2018-03-04 NOTE — Telephone Encounter (Signed)
Patient calls stating has an appointment with [place] in the morning, having an issue where with suture line where she had bilateral mastectomy, going by surgeons office in am to have them look at it.   May be a few minutes late.

## 2018-03-05 ENCOUNTER — Inpatient Hospital Stay (HOSPITAL_BASED_OUTPATIENT_CLINIC_OR_DEPARTMENT_OTHER): Payer: Federal, State, Local not specified - PPO | Admitting: Hematology

## 2018-03-05 ENCOUNTER — Inpatient Hospital Stay: Payer: Federal, State, Local not specified - PPO

## 2018-03-05 ENCOUNTER — Telehealth: Payer: Self-pay

## 2018-03-05 ENCOUNTER — Inpatient Hospital Stay: Payer: Federal, State, Local not specified - PPO | Attending: Hematology

## 2018-03-05 VITALS — BP 147/90 | HR 77 | Temp 98.7°F | Resp 17 | Ht 66.0 in | Wt 222.5 lb

## 2018-03-05 DIAGNOSIS — I10 Essential (primary) hypertension: Secondary | ICD-10-CM | POA: Insufficient documentation

## 2018-03-05 DIAGNOSIS — Z7982 Long term (current) use of aspirin: Secondary | ICD-10-CM | POA: Diagnosis not present

## 2018-03-05 DIAGNOSIS — Z9221 Personal history of antineoplastic chemotherapy: Secondary | ICD-10-CM | POA: Diagnosis not present

## 2018-03-05 DIAGNOSIS — R590 Localized enlarged lymph nodes: Secondary | ICD-10-CM | POA: Insufficient documentation

## 2018-03-05 DIAGNOSIS — Z87891 Personal history of nicotine dependence: Secondary | ICD-10-CM

## 2018-03-05 DIAGNOSIS — Z452 Encounter for adjustment and management of vascular access device: Secondary | ICD-10-CM | POA: Diagnosis not present

## 2018-03-05 DIAGNOSIS — Z806 Family history of leukemia: Secondary | ICD-10-CM | POA: Diagnosis not present

## 2018-03-05 DIAGNOSIS — Z9013 Acquired absence of bilateral breasts and nipples: Secondary | ICD-10-CM | POA: Diagnosis not present

## 2018-03-05 DIAGNOSIS — Z9049 Acquired absence of other specified parts of digestive tract: Secondary | ICD-10-CM | POA: Diagnosis not present

## 2018-03-05 DIAGNOSIS — L608 Other nail disorders: Secondary | ICD-10-CM | POA: Insufficient documentation

## 2018-03-05 DIAGNOSIS — C50512 Malignant neoplasm of lower-outer quadrant of left female breast: Secondary | ICD-10-CM | POA: Diagnosis not present

## 2018-03-05 DIAGNOSIS — K5730 Diverticulosis of large intestine without perforation or abscess without bleeding: Secondary | ICD-10-CM

## 2018-03-05 DIAGNOSIS — Z79899 Other long term (current) drug therapy: Secondary | ICD-10-CM | POA: Diagnosis not present

## 2018-03-05 DIAGNOSIS — Z9889 Other specified postprocedural states: Secondary | ICD-10-CM | POA: Diagnosis not present

## 2018-03-05 DIAGNOSIS — Z171 Estrogen receptor negative status [ER-]: Secondary | ICD-10-CM | POA: Diagnosis not present

## 2018-03-05 DIAGNOSIS — D259 Leiomyoma of uterus, unspecified: Secondary | ICD-10-CM | POA: Insufficient documentation

## 2018-03-05 DIAGNOSIS — Z803 Family history of malignant neoplasm of breast: Secondary | ICD-10-CM | POA: Insufficient documentation

## 2018-03-05 DIAGNOSIS — R911 Solitary pulmonary nodule: Secondary | ICD-10-CM | POA: Insufficient documentation

## 2018-03-05 DIAGNOSIS — K219 Gastro-esophageal reflux disease without esophagitis: Secondary | ICD-10-CM | POA: Insufficient documentation

## 2018-03-05 DIAGNOSIS — Z95828 Presence of other vascular implants and grafts: Secondary | ICD-10-CM

## 2018-03-05 LAB — COMPREHENSIVE METABOLIC PANEL
ALT: 30 U/L (ref 0–44)
ANION GAP: 8 (ref 5–15)
AST: 28 U/L (ref 15–41)
Albumin: 3.8 g/dL (ref 3.5–5.0)
Alkaline Phosphatase: 68 U/L (ref 38–126)
BUN: 12 mg/dL (ref 6–20)
CHLORIDE: 104 mmol/L (ref 98–111)
CO2: 30 mmol/L (ref 22–32)
Calcium: 9.2 mg/dL (ref 8.9–10.3)
Creatinine, Ser: 0.54 mg/dL (ref 0.44–1.00)
GFR calc non Af Amer: >60 mL/min (ref >60)
Glucose, Bld: 107 mg/dL — ABNORMAL HIGH (ref 70–99)
Potassium: 3.7 mmol/L (ref 3.5–5.1)
SODIUM: 142 mmol/L (ref 135–145)
Total Bilirubin: 0.9 mg/dL (ref 0.3–1.2)
Total Protein: 7.9 g/dL (ref 6.5–8.1)

## 2018-03-05 LAB — CBC WITH DIFFERENTIAL/PLATELET
Basophils Absolute: 0 10*3/uL (ref 0.0–0.1)
Basophils Relative: 1 %
EOS ABS: 0.2 10*3/uL (ref 0.0–0.5)
Eosinophils Relative: 3 %
HCT: 37.8 % (ref 34.8–46.6)
Hemoglobin: 12.5 g/dL (ref 11.6–15.9)
LYMPHS ABS: 1.8 10*3/uL (ref 0.9–3.3)
Lymphocytes Relative: 28 %
MCH: 27.1 pg (ref 25.1–34.0)
MCHC: 33.1 g/dL (ref 31.5–36.0)
MCV: 81.8 fL (ref 79.5–101.0)
Monocytes Absolute: 0.5 10*3/uL (ref 0.1–0.9)
Monocytes Relative: 8 %
Neutro Abs: 3.9 10*3/uL (ref 1.5–6.5)
Neutrophils Relative %: 60 %
PLATELETS: 295 10*3/uL (ref 145–400)
RBC: 4.62 MIL/uL (ref 3.70–5.45)
RDW: 13.2 % (ref 11.2–14.5)
WBC: 6.5 10*3/uL (ref 3.9–10.3)

## 2018-03-05 MED ORDER — HEPARIN SOD (PORK) LOCK FLUSH 100 UNIT/ML IV SOLN
500.0000 [IU] | Freq: Once | INTRAVENOUS | Status: AC | PRN
  Administered 2018-03-05: 500 [IU] via INTRAVENOUS
  Filled 2018-03-05: qty 5

## 2018-03-05 MED ORDER — SODIUM CHLORIDE 0.9% FLUSH
10.0000 mL | INTRAVENOUS | Status: DC | PRN
  Administered 2018-03-05: 10 mL via INTRAVENOUS
  Filled 2018-03-05: qty 10

## 2018-03-05 NOTE — Telephone Encounter (Signed)
Printed avs and calender of upcoming appointment. Per 6/28 los 

## 2018-03-06 ENCOUNTER — Encounter: Payer: Self-pay | Admitting: Hematology

## 2018-03-08 ENCOUNTER — Telehealth: Payer: Self-pay | Admitting: Hematology

## 2018-03-08 NOTE — Telephone Encounter (Signed)
Appointments added to schedule patient notified calendar/letter mailed to patient per 6/29 staff message

## 2018-03-09 DIAGNOSIS — Z853 Personal history of malignant neoplasm of breast: Secondary | ICD-10-CM | POA: Diagnosis not present

## 2018-03-09 DIAGNOSIS — Z09 Encounter for follow-up examination after completed treatment for conditions other than malignant neoplasm: Secondary | ICD-10-CM | POA: Diagnosis not present

## 2018-03-10 ENCOUNTER — Encounter: Payer: Self-pay | Admitting: Radiation Oncology

## 2018-03-10 DIAGNOSIS — N611 Abscess of the breast and nipple: Secondary | ICD-10-CM | POA: Diagnosis not present

## 2018-03-10 DIAGNOSIS — Z171 Estrogen receptor negative status [ER-]: Secondary | ICD-10-CM | POA: Diagnosis not present

## 2018-03-10 DIAGNOSIS — C50512 Malignant neoplasm of lower-outer quadrant of left female breast: Secondary | ICD-10-CM | POA: Diagnosis not present

## 2018-03-16 ENCOUNTER — Ambulatory Visit: Payer: Federal, State, Local not specified - PPO

## 2018-03-16 ENCOUNTER — Ambulatory Visit: Payer: Federal, State, Local not specified - PPO | Admitting: Radiation Oncology

## 2018-03-17 NOTE — Progress Notes (Signed)
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   Diagnosis 04-29-17 1. Breast, left, needle core biopsy, 5:30 o'clock, mass - INVASIVE DUCTAL CARCINOMA. - DUCTAL CARCINOMA IN SITU. - SEE COMMENT. 2. Lymph node, needle/core biopsy, left axilla - THERE IN NO EVIDENCE OF CARCINOMA IN 1 OF 1 LYMPH NODE (0/1). Receptors her2FINAL DIAGNOSI Receptor Status: ER(Neg), PR (Neg), Her2-neu (Neg), Ki-(90 %)  Did patient present with symptoms (if so, please note symptoms) or was this found on screening mammography?: She was found to have a palpable left as well as right breast mass, and proceeded with mammogram which revealed a 2 cm mass in the left breast, but no findings were seen of concern in the right breast. Diagnostic imaging on 04/27/17 revealed  A 2.5 x 1.6 x 2 cm mass at 5:30. The left axilla revealed a 1.3 cm lymph node.    Past/Anticipated interventions by surgeon, if any:  FINAL DIAGNOSIS Diagnosis 11-19-17 Dr. [name] 1. Breast, simple mastectomy, Right - FIBROCYSTIC AND FIBROADENOMATOID CHANGE. - NO MALIGNANCY IDENTIFIED. 2. Breast, simple mastectomy, Left - NO RESIDUAL CARCINOMA IDENTIFIED. - BIOPSY SITES. - FIBROCYSTIC AND FIBROADENOMATOID CHANGE. - SEE ONCOLOGY TABLE. 3. Lymph node, sentinel, biopsy, Left Axillary #1 - ONE OF ONE LYMPH NODES NEGATIVE FOR CARCINOMA (0/1). - BIOPSY SITE. 4. Lymph node, sentinel, biopsy, Left Axillary # 2 - ONE OF ONE LYMPH NODES NEGATIVE FOR CARCINOMA (0/1). Microscopic Comment 2. BREAST, STATUS POST NEOADJUVANT TREATMENT Procedure: Left simple mastectomy and left axillary sentinel lymph node biopsies.  05-19-17 Port-A-Cath Placement  Past/Anticipated interventions by medical oncology, if any:   Chemotherapy  05-22-17 10-09-17 Neoadjuvant chemotherpy AC  every 2 weeks for 4 cycles starting 05/22/17 and completed on 07/07/17, followed by weekly paclitaxel for 12 cycles starting 07/24/17 to complete on 10/09/17   09/28-18Genetic Testing  A Variant of UncertainSignificancewas detected: ATM [id number] (p.[id number]). The genes analyzed were the 46 genes on [facility]'s Common Cancers panel (APC, ATM, AXIN2, BARD1, BMPR1A, BRCA1, BRCA2, BRIP1, CDH1, CDKN2A, CHEK2, CTNNA1, DICER1, EPCAM, GREM1, HOXB13, KIT, MEN1, MLH1, MSH2, MSH3, MSH6, MUTYH, NBN, NF1, NTHL1, PALB2, PDGFRA, PMS2, POLD1, POLE, PTEN, RAD50, RAD51C, RAD51D, SDHA, SDHB, SDHC, SDHD, SMAD4, SMARCA4, STK11, TP53, TSC1, TSC2, VHL).   08-14-17   Echocardiogram      EF 60-65%  Study Conclusions  - Left ventricle: The cavity size was normal. Systolic function was normal. The estimated ejection fraction was in the range of 60% to 65%. Wall motion was normal; there were no regional wall motion abnormalities. Doppler parameters are consistent with abnormal left ventricular relaxation (grade 1 diastolic dysfunction). - Pulmonary arteries: PA peak pressure: 31 mm Hg (S).    Impressions:  - No change from September 2018 study (strain rate not performed on that study).   Lymphedema issues, if any:  No ROM to left arm good. Saw a Physical therapist in late April had an evaluation with one more visit was given exercises to do at home.  Skin to left breast healed well.  Has an area on the right outer incision line size of an eraser head  close to 1 inch depth there is yellwish drainage on her dressing now without odor.  Follow up appointment Dr. [name]  March 05, 2018.( Saw her PA)  Pain issues, if any: No   SAFETY ISSUES:  Prior radiation? :No  Pacemaker/ICD? : No  Possible current pregnancy?:No  Is the patient on methotrexate? : No  GYN HISTORY  Menarchal: 14 LMP: 05/01/17 Contraceptive: No HRT: n/a GP: G1P1 at age 11, she has a son  Current  Complaints / other details: Breast cancer maternal grandmother,paternal aunt and maternal aunt     Georgena Spurling, RN 03/17/2018,4:07 PM Wt Readings from Last 3 Encounters:  03/30/18 211 lb 12.8 oz (96.1 kg)  03/05/18 222 lb 8 oz (100.9 kg)  02/15/18 219 lb 1.9 oz (99.4 kg)  BP 115/87 (BP Location: Right Arm, Patient Position: Sitting, Cuff Size: Large)   Pulse 74   Temp 98.3 F (36.8 C) (Oral)   Resp 20   Ht '5\' 6"'$  (1.676 m)   Wt 211 lb 12.8 oz (96.1 kg)   LMP 05/20/2017   SpO2 100%   BMI 34.19 kg/m

## 2018-03-22 DIAGNOSIS — Z9013 Acquired absence of bilateral breasts and nipples: Secondary | ICD-10-CM | POA: Diagnosis not present

## 2018-03-22 DIAGNOSIS — Z6834 Body mass index (BMI) 34.0-34.9, adult: Secondary | ICD-10-CM | POA: Diagnosis not present

## 2018-03-30 ENCOUNTER — Ambulatory Visit
Admission: RE | Admit: 2018-03-30 | Discharge: 2018-03-30 | Disposition: A | Discharge status: Home / Self Care | Payer: Federal, State, Local not specified - PPO | Source: Ambulatory Visit | Attending: Radiation Oncology | Admitting: Radiation Oncology

## 2018-03-30 ENCOUNTER — Other Ambulatory Visit: Payer: Self-pay

## 2018-03-30 ENCOUNTER — Encounter: Payer: Self-pay | Admitting: Radiation Oncology

## 2018-03-30 VITALS — BP 115/87 | HR 74 | Temp 98.3°F | Resp 20 | Ht 66.0 in | Wt 211.8 lb

## 2018-03-30 DIAGNOSIS — Z87891 Personal history of nicotine dependence: Secondary | ICD-10-CM | POA: Diagnosis not present

## 2018-03-30 DIAGNOSIS — I10 Essential (primary) hypertension: Secondary | ICD-10-CM | POA: Diagnosis not present

## 2018-03-30 DIAGNOSIS — C50512 Malignant neoplasm of lower-outer quadrant of left female breast: Secondary | ICD-10-CM

## 2018-03-30 DIAGNOSIS — Z803 Family history of malignant neoplasm of breast: Secondary | ICD-10-CM | POA: Diagnosis not present

## 2018-03-30 DIAGNOSIS — Z7982 Long term (current) use of aspirin: Secondary | ICD-10-CM | POA: Insufficient documentation

## 2018-03-30 DIAGNOSIS — Z171 Estrogen receptor negative status [ER-]: Secondary | ICD-10-CM | POA: Insufficient documentation

## 2018-03-30 DIAGNOSIS — Z9013 Acquired absence of bilateral breasts and nipples: Secondary | ICD-10-CM | POA: Insufficient documentation

## 2018-03-30 DIAGNOSIS — Z79899 Other long term (current) drug therapy: Secondary | ICD-10-CM | POA: Insufficient documentation

## 2018-03-30 DIAGNOSIS — Z9221 Personal history of antineoplastic chemotherapy: Secondary | ICD-10-CM | POA: Diagnosis not present

## 2018-03-30 DIAGNOSIS — Z9049 Acquired absence of other specified parts of digestive tract: Secondary | ICD-10-CM | POA: Diagnosis not present

## 2018-03-30 DIAGNOSIS — K219 Gastro-esophageal reflux disease without esophagitis: Secondary | ICD-10-CM | POA: Insufficient documentation

## 2018-03-30 NOTE — Progress Notes (Signed)
Radiation Oncology         [phone] ________________________________  Name: Briana Price        MRN: [id number]  Date of Service: 03/30/2018 DOB: 14-Feb-1966  CC:Briana Caraway, MD  Briana Merle, MD     REFERRING PHYSICIAN: [name], MD   DIAGNOSIS: The encounter diagnosis was Malignant neoplasm of lower-outer quadrant of left breast of female, estrogen receptor negative ([place]).   HISTORY OF PRESENT ILLNESS: Briana Price is a 52 y.o. female seen in the multidisciplinary breast clinic for a new diagnosis of left breast cancer. She was found to have a palpable left as well as right breast mass, and proceeded with mammogram which revealed a 2 cm mass in the left breast, but no findings were seen of concern in the right breast. Diagnostic imaging on 04/27/17 revealed  A 2.5 x 1.6 x 2 cm mass at 5:30. The left axilla revealed a 1.3 cm lymph node. 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 She underwent neoadjuvant chemotherapy between September 2018 and February 2019. She had a complete radiographic response, and underwent surgical resection with bilateral mastectomies and left sentinel node biopsy. Her right breast revealed fibrocystic changes, and her left did not have any residual cancer. Her two sampled nodes were also negative. She comes today to discuss any additional comments regarding options of radiotherapy, and is also planning autologous reconstruction with Dr. Harlow Price and Dr. Lynnell Price at [facility].   PREVIOUS RADIATION THERAPY: No   PAST MEDICAL HISTORY:  Past Medical History:  Diagnosis Date  . Anemia   . Breast cancer ([place])   . Cancer [facility])    left breast cancer  . Family history of breast cancer   . Genetic testing 05/28/2017   Common Cancers panel (46 genes) @ [facility] - No pathogenic mutations detected  . GERD (gastroesophageal reflux  disease)   . Headache    Migraines  . Hypertension        PAST SURGICAL HISTORY: Past Surgical History:  Procedure Laterality Date  . BREAST SURGERY     biopsy  . CESAREAN SECTION    . CHOLECYSTECTOMY N/A 02/07/2013   Procedure: LAPAROSCOPIC CHOLECYSTECTOMY WITH INTRAOPERATIVE CHOLANGIOGRAM;  Surgeon: Briana Ober, MD;  Location: [place];  Service: General;  Laterality: N/A;  . COLONOSCOPY W/ BIOPSIES AND POLYPECTOMY    . FOOT SURGERY  2003   lt foot bunionectomy  . MASTECTOMY W/ SENTINEL NODE BIOPSY Bilateral 11/19/2017   Procedure: BILATERAL MASTECTOMIES WITH LEFT SENTINEL LYMPH NODE BIOPSY;  Surgeon: Briana Klein, MD;  Location: [place];  Service: General;  Laterality: Bilateral;  . MULTIPLE TOOTH EXTRACTIONS    . PORTACATH PLACEMENT Right 05/19/2017   Procedure: INSERTION PORT-A-CATH;  Surgeon: Briana Klein, MD;  Location: [facility];  Service: General;  Laterality: Right;     FAMILY HISTORY:  Family History  Problem Relation Age of Onset  . Breast cancer Maternal Grandmother        dx 73s; deceased 77  . Breast cancer Paternal Aunt 29       recurrence vs. 2nd primary at 79; currently 76  . Breast cancer Maternal Aunt 62       deceased 37  . Hypertension Mother   . Breast cancer Other        pat grandfather's sister; dx 50s  .  Leukemia Paternal Uncle        deceased 49  . Breast cancer Other        paternal distant cousin; dx 28s     SOCIAL HISTORY:  reports that she has quit smoking. Her smoking use included cigarettes. She has never used smokeless tobacco. She reports that she does not drink alcohol or use drugs. The patient is single and works for the [place] [employer]. She has a teenage son, and is accompanied by her parents.   ALLERGIES: Patient has no known allergies.   MEDICATIONS:  Current Outpatient Medications  Medication Sig Dispense Refill  . aspirin-acetaminophen-caffeine (EXCEDRIN MIGRAINE) 250-250-65 MG tablet Take 2  tablets by mouth 2 (two) times daily as needed for headache.    Marland Kitchen atorvastatin (LIPITOR) 20 MG tablet Take 20 mg by mouth daily.  1  . Cholecalciferol (VITAMIN D) 2000 units CAPS Take 2,000 Units by mouth daily.    . ferrous sulfate 325 (65 FE) MG tablet Take 325 mg by mouth every other day.     . hydrochlorothiazide (HYDRODIURIL) 25 MG tablet Take 25 mg by mouth daily.    Marland Kitchen lisinopril (PRINIVIL,ZESTRIL) 10 MG tablet Take 10 mgs by mouth daily  2  . Multiple Vitamins-Minerals (MULTIVITAMIN ADULT PO) Take 1 tablet by mouth daily.    . fluocinonide ointment (LIDEX) 5.46 % Apply 1 application topically daily as needed (rosacea).    . methocarbamol (ROBAXIN) 500 MG tablet Take 1 tablet (500 mg total) by mouth every 8 (eight) hours as needed for muscle spasms. (Patient not taking: Reported on 12/16/2017) 30 tablet 1  . ondansetron (ZOFRAN) 8 MG tablet Take 8 mg by mouth every 8 (eight) hours as needed for nausea or vomiting.     No current facility-administered medications for this encounter.    Facility-Administered Medications Ordered in Other Encounters  Medication Dose Route Frequency Provider Last Rate Last Dose  . sodium chloride flush (NS) 0.9 % injection 10 mL  10 mL Intravenous PRN Briana Merle, MD   10 mL at 08/07/17 1354     REVIEW OF SYSTEMS: On review of systems, the patient reports that she is doing well overall. She still has some healing along the right mastectomy site. She is using wet to dry dressings along this site daily. She denies any chest pain, shortness of breath, cough, fevers, chills, night sweats, unintended weight changes. She denies any bowel or bladder disturbances, and denies abdominal pain, nausea or vomiting. She denies any new musculoskeletal or joint aches or pains. A complete review of systems is obtained and is otherwise negative.     PHYSICAL EXAM:  Wt Readings from Last 3 Encounters:  03/30/18 211 lb 12.8 oz (96.1 kg)  03/05/18 222 lb 8 oz (100.9 kg)    02/15/18 219 lb 1.9 oz (99.4 kg)   Temp Readings from Last 3 Encounters:  03/30/18 98.3 F (36.8 C) (Oral)  03/05/18 98.7 F (37.1 C) (Oral)  12/03/17 98.7 F (37.1 C) (Oral)   BP Readings from Last 3 Encounters:  03/30/18 115/87  03/05/18 (!) 147/90  02/15/18 (!) 144/96   Pulse Readings from Last 3 Encounters:  03/30/18 74  03/05/18 77  02/15/18 74    In general this is a well appearing African American female in no acute distress. She's alert and oriented x4 and appropriate throughout the examination. Cardiopulmonary assessment is negative for acute distress and she exhibits normal effort. Right mastectomy line is evaluated and there is about 2 cm  of separation noted, this is redressed.     ECOG = 0  0 - Asymptomatic (Fully active, able to carry on all predisease activities without restriction)  1 - Symptomatic but completely ambulatory (Restricted in physically strenuous activity but ambulatory and able to carry out work of a light or sedentary nature. For example, light housework, office work)  2 - Symptomatic, <50% in bed during the day (Ambulatory and capable of all self care but unable to carry out any work activities. Up and about more than 50% of waking hours)  3 - Symptomatic, >50% in bed, but not bedbound (Capable of only limited self-care, confined to bed or chair 50% or more of waking hours)  4 - Bedbound (Completely disabled. Cannot carry on any self-care. Totally confined to bed or chair)  5 - Death   Eustace Pen MM, Creech RH, Tormey DC, et al. [phone]). "Toxicity and response criteria of the [facility]". [facility] Oncol. 5 (6): 649-55    LABORATORY DATA:  Lab Results  Component Value Date   WBC 6.5 03/05/2018   HGB 12.5 03/05/2018   HCT 37.8 03/05/2018   MCV 81.8 03/05/2018   PLT 295 03/05/2018   Lab Results  Component Value Date   NA 142 03/05/2018   K 3.7 03/05/2018   CL 104 03/05/2018   CO2 30 03/05/2018   Lab Results   Component Value Date   ALT 30 03/05/2018   AST 28 03/05/2018   ALKPHOS 68 03/05/2018   BILITOT 0.9 03/05/2018      RADIOGRAPHY: No results found.     IMPRESSION/PLAN: 1. 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 Dr. Lisbeth Renshaw discusses reviews the course of her care to date. He reviews the higher risk features of the triple negative component. He discusses with the patient that there would be an option to consider radiotherapy to reduce her risk of local recurrence, however he did not feel strongly that this was an absolute. We discussed the risks, benefits, short, and long term effects of radiotherapy, and the patient is not interested in radiotherapy at this time. She was encouraged to contact us if she has any questions or concerns moving forward.   In a visit lasting 45 minutes, greater than 50% of the time was spent face to face discussing her case, and coordinating the patient's care.   The above documentation reflects my direct findings during this shared patient visit. Please see the separate note by Dr. Lisbeth Renshaw on this date for the remainder of the patient's plan of care.    Carola Rhine, PAC

## 2018-04-07 ENCOUNTER — Telehealth: Payer: Self-pay

## 2018-04-07 NOTE — Telephone Encounter (Signed)
PATIENT REQUESTED CALENDER OF ALL UPCOMING APPOINTMENTS. PER 7/31 PHONE QUE WAS MAILED

## 2018-04-14 DIAGNOSIS — C50512 Malignant neoplasm of lower-outer quadrant of left female breast: Secondary | ICD-10-CM | POA: Diagnosis not present

## 2018-04-16 ENCOUNTER — Inpatient Hospital Stay: Payer: Federal, State, Local not specified - PPO | Attending: Hematology

## 2018-04-16 DIAGNOSIS — Z9221 Personal history of antineoplastic chemotherapy: Secondary | ICD-10-CM | POA: Insufficient documentation

## 2018-04-16 DIAGNOSIS — Z95828 Presence of other vascular implants and grafts: Secondary | ICD-10-CM

## 2018-04-16 DIAGNOSIS — Z9013 Acquired absence of bilateral breasts and nipples: Secondary | ICD-10-CM | POA: Diagnosis not present

## 2018-04-16 DIAGNOSIS — Z452 Encounter for adjustment and management of vascular access device: Secondary | ICD-10-CM | POA: Insufficient documentation

## 2018-04-16 DIAGNOSIS — Z171 Estrogen receptor negative status [ER-]: Secondary | ICD-10-CM | POA: Insufficient documentation

## 2018-04-16 DIAGNOSIS — C773 Secondary and unspecified malignant neoplasm of axilla and upper limb lymph nodes: Secondary | ICD-10-CM | POA: Insufficient documentation

## 2018-04-16 DIAGNOSIS — C50512 Malignant neoplasm of lower-outer quadrant of left female breast: Secondary | ICD-10-CM | POA: Insufficient documentation

## 2018-04-16 MED ORDER — SODIUM CHLORIDE 0.9% FLUSH
10.0000 mL | INTRAVENOUS | Status: DC | PRN
  Administered 2018-04-16: 10 mL via INTRAVENOUS
  Filled 2018-04-16: qty 10

## 2018-04-16 MED ORDER — HEPARIN SOD (PORK) LOCK FLUSH 100 UNIT/ML IV SOLN
500.0000 [IU] | Freq: Once | INTRAVENOUS | Status: AC | PRN
  Administered 2018-04-16: 500 [IU] via INTRAVENOUS
  Filled 2018-04-16: qty 5

## 2018-05-03 DIAGNOSIS — I10 Essential (primary) hypertension: Secondary | ICD-10-CM | POA: Diagnosis not present

## 2018-05-03 DIAGNOSIS — E782 Mixed hyperlipidemia: Secondary | ICD-10-CM | POA: Diagnosis not present

## 2018-05-03 DIAGNOSIS — E559 Vitamin D deficiency, unspecified: Secondary | ICD-10-CM | POA: Diagnosis not present

## 2018-05-03 DIAGNOSIS — D509 Iron deficiency anemia, unspecified: Secondary | ICD-10-CM | POA: Diagnosis not present

## 2018-05-18 ENCOUNTER — Telehealth: Payer: Self-pay

## 2018-05-18 NOTE — Telephone Encounter (Signed)
Patient called to obtain information on plastic surgeon in [place] who does deep flap surgery.  Obtained information from [place].  Called patient with contact info:  Dr. Standley Brooking  Contact:  Katie at [phone]

## 2018-05-28 ENCOUNTER — Inpatient Hospital Stay: Payer: Federal, State, Local not specified - PPO | Attending: Hematology

## 2018-05-28 DIAGNOSIS — Z9221 Personal history of antineoplastic chemotherapy: Secondary | ICD-10-CM | POA: Insufficient documentation

## 2018-05-28 DIAGNOSIS — Z9013 Acquired absence of bilateral breasts and nipples: Secondary | ICD-10-CM | POA: Diagnosis not present

## 2018-05-28 DIAGNOSIS — C50512 Malignant neoplasm of lower-outer quadrant of left female breast: Secondary | ICD-10-CM | POA: Insufficient documentation

## 2018-05-28 DIAGNOSIS — Z171 Estrogen receptor negative status [ER-]: Secondary | ICD-10-CM | POA: Insufficient documentation

## 2018-05-28 DIAGNOSIS — Z452 Encounter for adjustment and management of vascular access device: Secondary | ICD-10-CM | POA: Insufficient documentation

## 2018-05-28 DIAGNOSIS — Z95828 Presence of other vascular implants and grafts: Secondary | ICD-10-CM

## 2018-05-28 MED ORDER — HEPARIN SOD (PORK) LOCK FLUSH 100 UNIT/ML IV SOLN
500.0000 [IU] | Freq: Once | INTRAVENOUS | Status: AC | PRN
  Administered 2018-05-28: 500 [IU] via INTRAVENOUS
  Filled 2018-05-28: qty 5

## 2018-05-28 MED ORDER — SODIUM CHLORIDE 0.9% FLUSH
10.0000 mL | INTRAVENOUS | Status: DC | PRN
  Administered 2018-05-28: 10 mL via INTRAVENOUS
  Filled 2018-05-28: qty 10

## 2018-05-28 NOTE — Patient Instructions (Signed)
Implanted Port Home Guide An implanted port is a type of central line that is placed under the skin. Central lines are used to provide IV access when treatment or nutrition needs to be given through a person's veins. Implanted ports are used for long-term IV access. An implanted port may be placed because:  You need IV medicine that would be irritating to the small veins in your hands or arms.  You need long-term IV medicines, such as antibiotics.  You need IV nutrition for a long period.  You need frequent blood draws for lab tests.  You need dialysis.  Implanted ports are usually placed in the chest area, but they can also be placed in the upper arm, the abdomen, or the leg. An implanted port has two main parts:  Reservoir. The reservoir is round and will appear as a small, raised area under your skin. The reservoir is the part where a needle is inserted to give medicines or draw blood.  Catheter. The catheter is a thin, flexible tube that extends from the reservoir. The catheter is placed into a large vein. Medicine that is inserted into the reservoir goes into the catheter and then into the vein.  How will I care for my incision site? Do not get the incision site wet. Bathe or shower as directed by your health care provider. How is my port accessed? Special steps must be taken to access the port:  Before the port is accessed, a numbing cream can be placed on the skin. This helps numb the skin over the port site.  Your health care provider uses a sterile technique to access the port. ? Your health care provider must put on a mask and sterile gloves. ? The skin over your port is cleaned carefully with an antiseptic and allowed to dry. ? The port is gently pinched between sterile gloves, and a needle is inserted into the port.  Only "non-coring" port needles should be used to access the port. Once the port is accessed, a blood return should be checked. This helps ensure that the port  is in the vein and is not clogged.  If your port needs to remain accessed for a constant infusion, a clear (transparent) bandage will be placed over the needle site. The bandage and needle will need to be changed every week, or as directed by your health care provider.  Keep the bandage covering the needle clean and dry. Do not get it wet. Follow your health care provider's instructions on how to take a shower or bath while the port is accessed.  If your port does not need to stay accessed, no bandage is needed over the port.  What is flushing? Flushing helps keep the port from getting clogged. Follow your health care provider's instructions on how and when to flush the port. Ports are usually flushed with saline solution or a medicine called heparin. The need for flushing will depend on how the port is used.  If the port is used for intermittent medicines or blood draws, the port will need to be flushed: ? After medicines have been given. ? After blood has been drawn. ? As part of routine maintenance.  If a constant infusion is running, the port may not need to be flushed.  How long will my port stay implanted? The port can stay in for as long as your health care provider thinks it is needed. When it is time for the port to come out, surgery will be   done to remove it. The procedure is similar to the one performed when the port was put in. When should I seek immediate medical care? When you have an implanted port, you should seek immediate medical care if:  You notice a bad smell coming from the incision site.  You have swelling, redness, or drainage at the incision site.  You have more swelling or pain at the port site or the surrounding area.  You have a fever that is not controlled with medicine.  This information is not intended to replace advice given to you by your health care provider. Make sure you discuss any questions you have with your health care provider. Document  Released: 08/25/2005 Document Revised: 01/31/2016 Document Reviewed: 05/02/2013 Elsevier Interactive Patient Education  2017 [employer]  

## 2018-06-19 DIAGNOSIS — K5730 Diverticulosis of large intestine without perforation or abscess without bleeding: Secondary | ICD-10-CM | POA: Diagnosis not present

## 2018-06-19 DIAGNOSIS — Z9013 Acquired absence of bilateral breasts and nipples: Secondary | ICD-10-CM | POA: Diagnosis not present

## 2018-06-19 DIAGNOSIS — Z01818 Encounter for other preprocedural examination: Secondary | ICD-10-CM | POA: Diagnosis not present

## 2018-07-01 ENCOUNTER — Telehealth: Payer: Self-pay

## 2018-07-01 NOTE — Telephone Encounter (Signed)
Spoke with pt to remind of SCP visit with NP on 07/05/18 at 10 am.  Pt said she will come to appt.

## 2018-07-05 ENCOUNTER — Encounter: Payer: Self-pay | Admitting: Adult Health

## 2018-07-05 ENCOUNTER — Inpatient Hospital Stay: Payer: Federal, State, Local not specified - PPO | Attending: Hematology | Admitting: Adult Health

## 2018-07-05 ENCOUNTER — Telehealth: Payer: Self-pay | Admitting: Adult Health

## 2018-07-05 VITALS — BP 127/78 | HR 80 | Temp 98.5°F | Resp 18 | Ht 66.0 in | Wt 223.0 lb

## 2018-07-05 DIAGNOSIS — K5730 Diverticulosis of large intestine without perforation or abscess without bleeding: Secondary | ICD-10-CM | POA: Insufficient documentation

## 2018-07-05 DIAGNOSIS — Z7982 Long term (current) use of aspirin: Secondary | ICD-10-CM | POA: Diagnosis not present

## 2018-07-05 DIAGNOSIS — Z87891 Personal history of nicotine dependence: Secondary | ICD-10-CM | POA: Diagnosis not present

## 2018-07-05 DIAGNOSIS — D259 Leiomyoma of uterus, unspecified: Secondary | ICD-10-CM | POA: Diagnosis not present

## 2018-07-05 DIAGNOSIS — Z79899 Other long term (current) drug therapy: Secondary | ICD-10-CM | POA: Diagnosis not present

## 2018-07-05 DIAGNOSIS — Z9221 Personal history of antineoplastic chemotherapy: Secondary | ICD-10-CM

## 2018-07-05 DIAGNOSIS — Z803 Family history of malignant neoplasm of breast: Secondary | ICD-10-CM

## 2018-07-05 DIAGNOSIS — Z8249 Family history of ischemic heart disease and other diseases of the circulatory system: Secondary | ICD-10-CM

## 2018-07-05 DIAGNOSIS — K5790 Diverticulosis of intestine, part unspecified, without perforation or abscess without bleeding: Secondary | ICD-10-CM

## 2018-07-05 DIAGNOSIS — Z171 Estrogen receptor negative status [ER-]: Secondary | ICD-10-CM | POA: Diagnosis not present

## 2018-07-05 DIAGNOSIS — Z6835 Body mass index (BMI) 35.0-35.9, adult: Secondary | ICD-10-CM | POA: Diagnosis not present

## 2018-07-05 DIAGNOSIS — Z853 Personal history of malignant neoplasm of breast: Secondary | ICD-10-CM

## 2018-07-05 DIAGNOSIS — Z9013 Acquired absence of bilateral breasts and nipples: Secondary | ICD-10-CM

## 2018-07-05 DIAGNOSIS — Z452 Encounter for adjustment and management of vascular access device: Secondary | ICD-10-CM | POA: Diagnosis not present

## 2018-07-05 DIAGNOSIS — C50512 Malignant neoplasm of lower-outer quadrant of left female breast: Secondary | ICD-10-CM

## 2018-07-05 DIAGNOSIS — Z1151 Encounter for screening for human papillomavirus (HPV): Secondary | ICD-10-CM | POA: Diagnosis not present

## 2018-07-05 DIAGNOSIS — Z01419 Encounter for gynecological examination (general) (routine) without abnormal findings: Secondary | ICD-10-CM | POA: Diagnosis not present

## 2018-07-05 NOTE — Progress Notes (Signed)
CLINIC:  Survivorship   REASON FOR VISIT:  Routine follow-up post-treatment for a recent history of breast cancer.  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 Highly suspicious mass within the LEFT breast at the 5:30 o'clock axis, 4 cm from the nipple, measuring 2.5 cm, corresponding to the mammographic finding and corresponding to 1 of the palpable lumps in the left breast. Ultrasound-guided biopsy is recommended. 2. Mildly prominent lymph node in the LEFT axilla, with normal cortical thickness but with questionable effacement of the fatty hilum. Ultrasound-guided core biopsy is recommended. 3. No evidence of malignancy within the RIGHT breast. Also, no evidence of malignancy within the second area of clinical concern in the upper outer left breast.    04/29/2017 Initial Diagnosis    Malignant neoplasm of lower-outer quadrant of left breast of female, estrogen receptor negative ([name])    04/29/2017 Receptors her2    Estrogen Receptor: 0%, NEGATIVE Progesterone Receptor: 0%, NEGATIVE Proliferation Marker Ki67: 90% HER2 NEGATIVE     04/29/2017 Initial Biopsy     Diagnosis 1. 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 - SEE COMMENT. 2. Lymph node, needle/core biopsy, left axilla - THERE IN NO EVIDENCE OF CARCINOMA IN 1 OF 1 LYMPH NODE (0/1).    05/14/2017 Imaging    MRI of Breast Bilateral 05/14/17  IMPRESSION: 1. Biopsy-proven invasive ductal carcinoma and DCIS involving the lower outer quadrant of  the left breast at posterior depth, maximum measurement 2.5 cm. 2. Non mass enhancement extending approximately 3.2 cm anterior to the mass in the lower inner quadrant of the left breast, suspicious for DCIS. There is no correlate on recent mammography. The overall extent of the mass and non mass enhancement is approximately 5 cm. 3. No MRI evidence of malignancy involving the right breast. 4. No pathologic lymphadenopathy. Upper normal sized left axillary lymph nodes, the largest of which was previously biopsied no evidence of metastatic disease.    05/15/2017 Imaging    Bone scan whole body 05/15/17 IMPRESSION: No findings specific for metastatic disease to bone.    05/15/2017 Imaging    CT CAP W contrast 05/15/17  IMPRESSION: 1. 18 mm soft tissue lesion inferior left breast compatible with known primary malignancy. 2. Mildly enlarged left axillary lymph nodes in this patient with biopsy-proven metastatic disease to the left axilla. 3. No evidence for lymphadenopathy elsewhere in the chest. No lymphadenopathy in the abdomen or pelvis. No evidence for metastatic disease in the abdomen or pelvis. 4. Tiny right perifissural lung nodule most likely subpleural lymph node. Attention on follow-up recommended. 5. 7 mm hypoattenuating lesion in the spleen, likely a cyst or pseudocyst. Attention on follow-up recommended. 6. Uterine fibroids. 7.  Aortic Atherosclerois (ICD10-170.0)    05/19/2017 Procedure    Port placement by Dr. [name] on 9/11/8    05/22/2017 - 10/09/2017 Chemotherapy     neoadjuvant chemotherpy AC every 2 weeks for 4 cycles starting 05/22/17 and completed on 07/07/17, followed by weekly paclitaxel for 12 cycles starting 07/24/17 to complete on 10/09/17  05/22/2017 Pathology Results    Diagnosis Breast, left, needle core biopsy, lower inner quad - DUCTAL CARCINOMA IN SITU. 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  The genes analyzed were the 46 genes on [facility]'s Common Cancers panel (APC, ATM, AXIN2, BARD1, BMPR1A, BRCA1, BRCA2, BRIP1, CDH1, CDKN2A, CHEK2, CTNNA1, DICER1, EPCAM, GREM1, HOXB13, KIT, MEN1, MLH1, MSH2, MSH3, MSH6, MUTYH, NBN, NF1, NTHL1, PALB2, PDGFRA, PMS2, POLD1, POLE, PTEN, RAD50, RAD51C, RAD51D, SDHA, SDHB, SDHC, SDHD, SMAD4, SMARCA4, STK11, TP53, TSC1, TSC2, VHL).      08/14/2017 Echocardiogram    EF 60-65%  Study Conclusions  - Left ventricle: The cavity size was normal. Systolic function was   normal. The estimated ejection fraction was in the range of 60%   to 65%. Wall motion was normal; there were no regional wall   motion abnormalities. Doppler parameters are consistent with   abnormal left ventricular relaxation (grade 1 diastolic   dysfunction). - Pulmonary arteries: PA peak pressure: 31 mm Hg (S).  Impressions:  - No change from September 2018 study (strain rate not performed on   that study).       10/14/2017 Imaging    MRI Breast Bilateral  IMPRESSION:  Significant improvement following neoadjuvant treatment.  No residual enhancement in the left breast at sites of known malignancy.  RECOMMENDATION: Treatment plan for known malignancy.    11/19/2017 Surgery    BILATERAL MASTECTOMIES WITH LEFT SENTINEL LYMPH NODE BIOPSY by Dr. [name] 11/19/17    11/19/2017 Pathology Results     Diagnosis 11/19/17 1. Breast, simple mastectomy, Right - FIBROCYSTIC AND FIBROADENOMATOID CHANGE. - NO MALIGNANCY IDENTIFIED. 2. Breast, simple mastectomy, Left - NO RESIDUAL CARCINOMA IDENTIFIED. - BIOPSY SITES. - FIBROCYSTIC AND FIBROADENOMATOID CHANGE. - SEE ONCOLOGY TABLE. 3. Lymph node, sentinel, biopsy, Left Axillary #1 - ONE OF ONE LYMPH NODES NEGATIVE FOR CARCINOMA (0/1). - BIOPSY SITE. 4. Lymph node, sentinel, biopsy, Left Axillary # 2 - ONE OF ONE LYMPH NODES NEGATIVE FOR CARCINOMA (0/1).     12/28/2017 Imaging    CT CAP W Contrast 12/28/17 IMPRESSION: No evidence of recurrent or metastatic carcinoma. No other acute findings. 3.5 cm right uterine fibroid. Colonic diverticulosis, without radiographic evidence of diverticulitis.     INTERVAL HISTORY:  Ms. Lal presents to the [facility] today for our initial meeting to review her survivorship care plan detailing her treatment course for breast cancer, as well as monitoring long-term side effects of that treatment, education regarding health maintenance, screening, and overall wellness and health promotion.     Overall, Ms. Lapierre reports feeling quite well.  She notes she has no issues regarding range of motion.  She cannot recall any specific concerns from the chemotherapy she receives.  She does note some mild forgetfulness.     REVIEW OF SYSTEMS:  Review of Systems  Constitutional: Negative for appetite change, chills, fatigue, fever and unexpected weight change.  HENT:   Negative for hearing loss and trouble swallowing.   Eyes: Negative for eye problems and icterus.  Respiratory: Negative for chest tightness, cough and shortness of breath.   Cardiovascular: Negative for chest pain, leg swelling and palpitations.  Gastrointestinal: Negative for abdominal distention, abdominal pain, constipation, diarrhea, nausea and vomiting.  Endocrine: Negative for hot flashes.  Musculoskeletal: Negative for arthralgias.  Skin: Negative for itching and rash.  Neurological: Negative for dizziness, extremity weakness and headaches.  Hematological: Negative for adenopathy. Does not bruise/bleed easily.  Psychiatric/Behavioral: Negative for depression. The patient is not nervous/anxious.   Breast: Denies any new nodularity, masses, tenderness, nipple changes, or nipple discharge.      ONCOLOGY TREATMENT TEAM:  1. Surgeon:  Dr. [name] at [facility] 2. Medical Oncologist: Dr. Burr Medico      PAST MEDICAL/SURGICAL  HISTORY:  Past Medical History:  Diagnosis Date  . Anemia   . Breast cancer ([place])   . Cancer [facility])    left breast cancer  . Family history of breast cancer   . Genetic testing 05/28/2017   Common Cancers panel (46 genes) @ [facility] - No pathogenic mutations detected  . GERD (gastroesophageal reflux disease)   . Headache    Migraines  . Hypertension    Past Surgical History:  Procedure Laterality Date  . BREAST SURGERY     biopsy  . CESAREAN SECTION    . CHOLECYSTECTOMY N/A 02/07/2013   Procedure: LAPAROSCOPIC CHOLECYSTECTOMY WITH INTRAOPERATIVE CHOLANGIOGRAM;  Surgeon: Gwenyth Ober, MD;  Location: [place];  Service: General;  Laterality: N/A;  . COLONOSCOPY W/ BIOPSIES AND POLYPECTOMY    . FOOT SURGERY  2003   lt foot bunionectomy  . MASTECTOMY W/ SENTINEL NODE BIOPSY Bilateral 11/19/2017   Procedure: BILATERAL MASTECTOMIES WITH LEFT SENTINEL LYMPH NODE BIOPSY;  Surgeon: [name], MD;  Location: [place];  Service: General;  Laterality: Bilateral;  . MULTIPLE TOOTH EXTRACTIONS    . PORTACATH PLACEMENT Right 05/19/2017   Procedure: INSERTION PORT-A-CATH;  Surgeon: [name], MD;  Location: [place];  Service: General;  Laterality: Right;     ALLERGIES:  No Known Allergies   CURRENT MEDICATIONS:  Outpatient Encounter Medications as of 07/05/2018  Medication Sig Note  . aspirin-acetaminophen-caffeine (EXCEDRIN MIGRAINE) 250-250-65 MG tablet Take 2 tablets by mouth 2 (two) times daily as needed for headache.   Marland Kitchen atorvastatin (LIPITOR) 20 MG tablet Take 20 mg by mouth daily.   . Cholecalciferol (VITAMIN D) 2000 units CAPS Take 2,000 Units by mouth daily.   . ferrous sulfate 325 (65 FE) MG tablet Take 325 mg by mouth every other day.    . fluocinonide ointment (LIDEX) 8.92 % Apply 1 application topically daily as needed (rosacea).   . hydrochlorothiazide (HYDRODIURIL) 25 MG tablet Take 25 mg by mouth daily.   Marland Kitchen lisinopril (PRINIVIL,ZESTRIL)  10 MG tablet Take 10 mgs by mouth daily   . methocarbamol (ROBAXIN) 500 MG tablet Take 1 tablet (500 mg total) by mouth every 8 (eight) hours as needed for muscle spasms. (Patient not taking: Reported on 12/16/2017)   . Multiple Vitamins-Minerals (MULTIVITAMIN ADULT PO) Take 1 tablet by mouth daily.   . ondansetron (ZOFRAN) 8 MG tablet Take 8 mg by mouth every 8 (eight) hours as needed for nausea or vomiting.   . [DISCONTINUED] prochlorperazine (COMPAZINE) 10 MG tablet Take 1 tablet (10 mg total) by mouth every 6 (six) hours as needed (Nausea or vomiting). 05/15/2017: hasnt started   Facility-Administered Encounter Medications as of 07/05/2018  Medication  . sodium chloride flush (NS) 0.9 % injection 10 mL     ONCOLOGIC FAMILY HISTORY:  Family History  Problem Relation Age of Onset  . Breast cancer Maternal Grandmother        dx 60s; deceased 44  . Breast cancer Paternal Aunt 35       recurrence vs. 2nd primary at 20; currently 26  . Breast cancer Maternal Aunt 58  deceased 31  . Hypertension Mother   . Breast cancer Other        pat grandfather's sister; dx 12s  . Leukemia Paternal Uncle        deceased 74  . Breast cancer Other        paternal distant cousin; dx 79s     GENETIC COUNSELING/TESTING: See above  SOCIAL HISTORY:  Social History   Socioeconomic History  . Marital status: Married    Spouse name: Not on file  . Number of children: Not on file  . Years of education: Not on file  . Highest education level: Not on file  Occupational History  . Not on file  Social Needs  . Financial resource strain: Not on file  . Food insecurity:    Worry: Not on file    Inability: Not on file  . Transportation needs:    Medical: Not on file    Non-medical: Not on file  Tobacco Use  . Smoking status: Former Smoker    Types: Cigarettes  . Smokeless tobacco: Never Used  . Tobacco comment: 15-20 years ago  Substance and Sexual Activity  . Alcohol use: No  . Drug use:  No  . Sexual activity: Not on file  Lifestyle  . Physical activity:    Days per week: Not on file    Minutes per session: Not on file  . Stress: Not on file  Relationships  . Social connections:    Talks on phone: Not on file    Gets together: Not on file    Attends religious service: Not on file    Active member of club or organization: Not on file    Attends meetings of clubs or organizations: Not on file    Relationship status: Not on file  . Intimate partner violence:    Fear of current or ex partner: Not on file    Emotionally abused: Not on file    Physically abused: Not on file    Forced sexual activity: Not on file  Other Topics Concern  . Not on file  Social History Narrative   Tobacco use   Cigarettes: Never smoked    Tobacco history last updated 01/23/2014   No smoking   No tobacco exposure   No alcohol   Caffeine : yes soda   No recreational drug use   Occupation :employed ,[employer] 01/25/2013   Martial status : single    Children: Louie Casa 01/25/2013   47 year old son      PHYSICAL EXAMINATION:  Vital Signs:   Vitals:   07/05/18 1000  BP: 127/78  Pulse: 80  Resp: 18  Temp: 98.5 F (36.9 C)  SpO2: 99%   Filed Weights   07/05/18 1000  Weight: 223 lb (101.2 kg)   General: Well-nourished, well-appearing female in no acute distress.  She is unaccompanied today.   HEENT: Head is normocephalic.  Pupils equal and reactive to light. Conjunctivae clear without exudate.  Sclerae anicteric. Oral mucosa is pink, moist.  Oropharynx is pink without lesions or erythema.  Lymph: No cervical, supraclavicular, or infraclavicular lymphadenopathy noted on palpation.  Cardiovascular: Regular rate and rhythm.Marland Kitchen Respiratory: Clear to auscultation bilaterally. Chest expansion symmetric; breathing non-labored.  Breasts: s/p bilateral mastectomy, no sign of local recurrence noted GI: Abdomen soft and round; non-tender, non-distended. Bowel sounds normoactive.  GU:  Deferred.  Neuro: No focal deficits. Steady gait.  Psych: Mood and affect normal and appropriate for situation.  Extremities: No edema. MSK: No focal spinal tenderness to palpation.  Full range of motion in bilateral upper extremities Skin: Warm and dry.  LABORATORY DATA:  None for this visit.  DIAGNOSTIC IMAGING:  None for this visit.      ASSESSMENT AND PLAN:  Ms.. Price is a pleasant 52 y.o. female with Stage IIB left breast invasive ductal carcinoma, ER-/PR-/HER2-, diagnosed in 04/2017, treated with neo adjuvant chemotherapy and bilateral mastectomies.  She presents to the Survivorship Clinic for our initial meeting and routine follow-up post-completion of treatment for breast cancer.    1. Stage IIB left breast cancer:  Ms. Bradt is continuing to recover from definitive treatment for breast cancer. She will follow-up with her medical oncologist, Dr. Burr Medico in 09/2018 with history and physical exam per surveillance protocol.  She has kept her port and notes that she has it scheduled to be flushed per protocol.  She underwent CT chest abdomen and pelvis in 12/2017 that showed no sign of cancer.  It did show diverticulosis and a uterine fibroid.  I printed out a copy of this report so that she could bring it to her GYN.   Today, a comprehensive survivorship care plan and treatment summary was reviewed with the patient today detailing her breast cancer diagnosis, treatment course, potential late/long-term effects of treatment, appropriate follow-up care with recommendations for the future, and patient education resources.  A copy of this summary, along with a letter will be sent to the patient's primary care provider via mail/fax/In Basket message after today's visit.    2. Bone health:  Given Ms. Whisner's history of breast cancer, she is at risk for bone demineralization.   She was given education on specific activities to promote bone health.  3. Cancer screening:  Due to Ms. Klecka's history and  her age, she should receive screening for skin cancers, colon cancer, and gynecologic cancers.  The information and recommendations are listed on the patient's comprehensive care plan/treatment summary and were reviewed in detail with the patient.    4. Health maintenance and wellness promotion: Ms. Jewel was encouraged to consume 5-7 servings of fruits and vegetables per day. We reviewed the "Nutrition Rainbow" handout, as well as the handout "Take Control of Your Health and Reduce Your Cancer Risk" from the [employer].  She was also encouraged to engage in moderate to vigorous exercise for 30 minutes per day most days of the week. We discussed the [employer] fitness program, which is designed for cancer survivors to help them become more physically fit after cancer treatments.  She was instructed to limit her alcohol consumption and continue to abstain from tobacco use.     5. Support services/counseling: It is not uncommon for this period of the patient's cancer care trajectory to be one of many emotions and stressors.  We discussed an opportunity for her to participate in the next session of [facility] ("Finding Your New Normal") support group series designed for patients after they have completed treatment.   Ms. Mccall was encouraged to take advantage of our many other support services programs, support groups, and/or counseling in coping with her new life as a cancer survivor after completing anti-cancer treatment.  She was offered support today through active listening and expressive supportive counseling.  She was given information regarding our available services and encouraged to contact me with any questions or for help enrolling in any of our support group/programs.   6. Forgetfulness: We reviewed that cognitive dysfunction is a  possible side effect from chemotherapy.  I reviewed that time typically helps, but also recommended mindfulness, meditation, word searches, crosswords, and  reading.     Dispo:   -Return to cancer center in 09/2018 for f/u with Dr. Burr Medico -She is welcome to return back to the Survivorship Clinic at any time; no additional follow-up needed at this time.  -Consider referral back to survivorship as a long-term survivor for continued surveillance  A total of (30) minutes of face-to-face time was spent with this patient with greater than 50% of that time in counseling and care-coordination.   Gardenia Phlegm, NP Survivorship Program [facility] [phone]   Note: PRIMARY CARE PROVIDER [name], [place] [phone]

## 2018-07-05 NOTE — Telephone Encounter (Signed)
No 10/28 los orders.

## 2018-07-07 ENCOUNTER — Telehealth: Payer: Self-pay | Admitting: *Deleted

## 2018-07-07 NOTE — Telephone Encounter (Signed)
"  Briana Price [phone]).  I need to cancel Friday's appointment (9:45 am 07-09-2018).  Just learned my son has surgery at 9:00 am the same day.  Please schedule flush tomorrow.  I cannot wait for return call.  This takes more than 24 hours to reschedule."

## 2018-07-08 ENCOUNTER — Inpatient Hospital Stay: Payer: Federal, State, Local not specified - PPO

## 2018-07-08 DIAGNOSIS — Z803 Family history of malignant neoplasm of breast: Secondary | ICD-10-CM | POA: Diagnosis not present

## 2018-07-08 DIAGNOSIS — Z8249 Family history of ischemic heart disease and other diseases of the circulatory system: Secondary | ICD-10-CM | POA: Diagnosis not present

## 2018-07-08 DIAGNOSIS — Z9221 Personal history of antineoplastic chemotherapy: Secondary | ICD-10-CM | POA: Diagnosis not present

## 2018-07-08 DIAGNOSIS — Z171 Estrogen receptor negative status [ER-]: Secondary | ICD-10-CM | POA: Diagnosis not present

## 2018-07-08 DIAGNOSIS — Z9013 Acquired absence of bilateral breasts and nipples: Secondary | ICD-10-CM | POA: Diagnosis not present

## 2018-07-08 DIAGNOSIS — D259 Leiomyoma of uterus, unspecified: Secondary | ICD-10-CM | POA: Diagnosis not present

## 2018-07-08 DIAGNOSIS — Z7982 Long term (current) use of aspirin: Secondary | ICD-10-CM | POA: Diagnosis not present

## 2018-07-08 DIAGNOSIS — Z95828 Presence of other vascular implants and grafts: Secondary | ICD-10-CM

## 2018-07-08 DIAGNOSIS — Z87891 Personal history of nicotine dependence: Secondary | ICD-10-CM | POA: Diagnosis not present

## 2018-07-08 DIAGNOSIS — Z853 Personal history of malignant neoplasm of breast: Secondary | ICD-10-CM | POA: Diagnosis not present

## 2018-07-08 DIAGNOSIS — Z452 Encounter for adjustment and management of vascular access device: Secondary | ICD-10-CM | POA: Diagnosis not present

## 2018-07-08 DIAGNOSIS — Z79899 Other long term (current) drug therapy: Secondary | ICD-10-CM | POA: Diagnosis not present

## 2018-07-08 DIAGNOSIS — K5730 Diverticulosis of large intestine without perforation or abscess without bleeding: Secondary | ICD-10-CM | POA: Diagnosis not present

## 2018-07-08 MED ORDER — SODIUM CHLORIDE 0.9% FLUSH
10.0000 mL | INTRAVENOUS | Status: DC | PRN
  Administered 2018-07-08: 10 mL via INTRAVENOUS
  Filled 2018-07-08: qty 10

## 2018-07-08 MED ORDER — HEPARIN SOD (PORK) LOCK FLUSH 100 UNIT/ML IV SOLN
500.0000 [IU] | Freq: Once | INTRAVENOUS | Status: AC | PRN
  Administered 2018-07-08: 500 [IU] via INTRAVENOUS
  Filled 2018-07-08: qty 5

## 2018-07-09 ENCOUNTER — Inpatient Hospital Stay: Payer: Federal, State, Local not specified - PPO

## 2018-07-14 DIAGNOSIS — Z853 Personal history of malignant neoplasm of breast: Secondary | ICD-10-CM | POA: Insufficient documentation

## 2018-07-14 DIAGNOSIS — Z9013 Acquired absence of bilateral breasts and nipples: Secondary | ICD-10-CM | POA: Insufficient documentation

## 2018-08-16 DIAGNOSIS — C50512 Malignant neoplasm of lower-outer quadrant of left female breast: Secondary | ICD-10-CM | POA: Diagnosis not present

## 2018-08-20 ENCOUNTER — Inpatient Hospital Stay: Payer: Federal, State, Local not specified - PPO | Attending: Hematology

## 2018-08-20 DIAGNOSIS — Z9013 Acquired absence of bilateral breasts and nipples: Secondary | ICD-10-CM | POA: Insufficient documentation

## 2018-08-20 DIAGNOSIS — Z853 Personal history of malignant neoplasm of breast: Secondary | ICD-10-CM | POA: Insufficient documentation

## 2018-08-20 DIAGNOSIS — Z9221 Personal history of antineoplastic chemotherapy: Secondary | ICD-10-CM | POA: Insufficient documentation

## 2018-08-20 DIAGNOSIS — Z171 Estrogen receptor negative status [ER-]: Secondary | ICD-10-CM | POA: Diagnosis not present

## 2018-08-20 DIAGNOSIS — Z95828 Presence of other vascular implants and grafts: Secondary | ICD-10-CM

## 2018-08-20 DIAGNOSIS — Z452 Encounter for adjustment and management of vascular access device: Secondary | ICD-10-CM | POA: Diagnosis not present

## 2018-08-20 MED ORDER — HEPARIN SOD (PORK) LOCK FLUSH 100 UNIT/ML IV SOLN
500.0000 [IU] | Freq: Once | INTRAVENOUS | Status: AC | PRN
  Administered 2018-08-20: 500 [IU] via INTRAVENOUS
  Filled 2018-08-20: qty 5

## 2018-08-20 MED ORDER — SODIUM CHLORIDE 0.9% FLUSH
10.0000 mL | INTRAVENOUS | Status: DC | PRN
  Administered 2018-08-20: 10 mL via INTRAVENOUS
  Filled 2018-08-20: qty 10

## 2018-08-20 NOTE — Patient Instructions (Signed)

## 2018-08-23 ENCOUNTER — Telehealth: Payer: Self-pay

## 2018-08-23 NOTE — Telephone Encounter (Signed)
Patient wants to know how long after chemotherapy does she have to wait to dye her hair.  [phone]

## 2018-08-23 NOTE — Telephone Encounter (Signed)
Please let her know it's OK to dye her hair now, thanks   [name] MD

## 2018-08-24 ENCOUNTER — Telehealth: Payer: Self-pay

## 2018-08-24 NOTE — Telephone Encounter (Signed)
Pt query regarding wait time post chemotherapy to dye her hair. Per Dr. Burr Medico, pt ok to proceed with hair dye. Pt verbalized understanding and thanks for the update.

## 2018-09-03 ENCOUNTER — Other Ambulatory Visit: Payer: Federal, State, Local not specified - PPO

## 2018-09-03 ENCOUNTER — Ambulatory Visit: Payer: Federal, State, Local not specified - PPO | Admitting: Hematology

## 2018-09-10 ENCOUNTER — Ambulatory Visit: Payer: Federal, State, Local not specified - PPO | Admitting: Hematology

## 2018-09-10 ENCOUNTER — Other Ambulatory Visit: Payer: Federal, State, Local not specified - PPO

## 2018-09-13 NOTE — Progress Notes (Signed)
Briana Price   [phone] [phone]   Clinic Follow up Note   Patient Care Team: [name], MD as PCP - General (Family Medicine) [name], MD as Consulting Physician (Hematology) [name], MD as Consulting Physician (General Surgery) [name], MD as Consulting Physician (Radiation Oncology) Gardenia Phlegm, NP as Nurse Practitioner (Hematology and Oncology)  Date of Service:  09/15/2018  CHIEF COMPLAINT: Follow up left breast cancer, triple negative  SUMMARY OF ONCOLOGIC HISTORY: Oncology History   Cancer Staging Malignant neoplasm of lower-outer quadrant of left breast of female, estrogen receptor negative ([place]) Staging form: Breast, AJCC 8th Edition - Clinical: Stage IIB (cT2, cN0(f), cM0, G3, ER: Negative, PR: Negative, HER2: Negative) - Unsigned       Malignant neoplasm of lower-outer quadrant of left breast of female, estrogen receptor negative ([place])   04/27/2017 Mammogram    IMPRESSION: 1. Highly suspicious mass within the LEFT breast at the 5:30 o'clock axis, 4 cm from the nipple, measuring 2.5 cm, corresponding to the mammographic finding and corresponding to 1 of the palpable lumps in the left breast. Ultrasound-guided biopsy is recommended. 2. Mildly prominent lymph node in the LEFT axilla, with normal cortical thickness but with questionable effacement of the fatty hilum. Ultrasound-guided core biopsy is recommended. 3. No evidence of malignancy within the RIGHT breast. Also, no evidence of malignancy within the second area of clinical concern in the upper outer left breast.    04/29/2017 Initial Diagnosis    Malignant neoplasm of lower-outer quadrant of left breast of female, estrogen receptor negative ([place])    04/29/2017 Receptors her2    Estrogen Receptor: 0%, NEGATIVE Progesterone Receptor: 0%, NEGATIVE Proliferation Marker Ki67: 90% HER2 NEGATIVE     04/29/2017 Initial Biopsy     Diagnosis 1.  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 - SEE COMMENT. 2. Lymph node, needle/core biopsy, left axilla - THERE IN NO EVIDENCE OF CARCINOMA IN 1 OF 1 LYMPH NODE (0/1).    05/14/2017 Imaging    MRI of Breast Bilateral 05/14/17  IMPRESSION: 1. 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 2. Non mass enhancement extending approximately 3.2 cm anterior to the mass in the lower inner quadrant of the left breast, suspicious for DCIS. There is no correlate on recent mammography. The overall extent of the mass and non mass enhancement is approximately 5 cm. 3. No MRI evidence of malignancy involving the right breast. 4. No pathologic lymphadenopathy. Upper normal sized left axillary lymph nodes, the largest of which was previously biopsied no evidence of metastatic disease.    05/15/2017 Imaging    Bone scan whole body 05/15/17 IMPRESSION: No findings specific for metastatic disease to bone.    05/15/2017 Imaging    CT CAP W contrast 05/15/17  IMPRESSION: 1. 18 mm soft tissue lesion inferior left breast compatible with known primary malignancy. 2. Mildly enlarged left axillary lymph nodes in this patient with biopsy-proven metastatic disease to the left axilla. 3. No evidence for lymphadenopathy elsewhere in the chest. No lymphadenopathy in the abdomen or pelvis. No evidence for metastatic disease in the abdomen or pelvis. 4. Tiny right perifissural lung nodule most likely subpleural lymph node. Attention on follow-up recommended. 5. 7 mm hypoattenuating lesion in the spleen, likely a cyst or pseudocyst. Attention on follow-up recommended. 6. Uterine fibroids. 7.  Aortic  Atherosclerois (ICD10-170.0)    05/19/2017 Procedure    Port placement by Dr. [name] on 9/11/8    05/22/2017 - 10/09/2017 Chemotherapy      neoadjuvant chemotherpy AC every 2 weeks for 4 cycles starting 05/22/17 and completed on 07/07/17, followed by weekly paclitaxel for 12 cycles starting 07/24/17 to complete on 10/09/17     05/22/2017 Pathology Results    Diagnosis Breast, left, needle core biopsy, lower inner quad - DUCTAL CARCINOMA IN SITU. 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  The genes analyzed were the 46 genes on [facility]'s Common Cancers panel (APC, ATM, AXIN2, BARD1, BMPR1A, BRCA1, BRCA2, BRIP1, CDH1, CDKN2A, CHEK2, CTNNA1, DICER1, EPCAM, GREM1, HOXB13, KIT, MEN1, MLH1, MSH2, MSH3, MSH6, MUTYH, NBN, NF1, NTHL1, PALB2, PDGFRA, PMS2, POLD1, POLE, PTEN, RAD50, RAD51C, RAD51D, SDHA, SDHB, SDHC, SDHD, SMAD4, SMARCA4, STK11, TP53, TSC1, TSC2, VHL).      08/14/2017 Echocardiogram    EF 60-65%  Study Conclusions  - Left ventricle: The cavity size was normal. Systolic function was   normal. The estimated ejection fraction was in the range of 60%   to 65%. Wall motion was normal; there were no regional wall   motion abnormalities. Doppler parameters are consistent with   abnormal left ventricular relaxation (grade 1 diastolic   dysfunction). - Pulmonary arteries: PA peak pressure: 31 mm Hg (S).  Impressions:  - No change from September 2018 study (strain rate not performed on   that study).       10/14/2017 Imaging    MRI Breast Bilateral  IMPRESSION:  Significant improvement following neoadjuvant treatment.  No residual enhancement in the left breast at sites of known malignancy.  RECOMMENDATION: Treatment plan for known malignancy.    11/19/2017 Surgery    BILATERAL MASTECTOMIES WITH LEFT SENTINEL LYMPH NODE BIOPSY by Dr. [name] 11/19/17    11/19/2017 Pathology Results     Diagnosis 11/19/17 1. Breast, simple mastectomy, Right - FIBROCYSTIC AND FIBROADENOMATOID CHANGE. - NO  MALIGNANCY IDENTIFIED. 2. Breast, simple mastectomy, Left - NO RESIDUAL CARCINOMA IDENTIFIED. - BIOPSY SITES. - FIBROCYSTIC AND FIBROADENOMATOID CHANGE. - SEE ONCOLOGY TABLE. 3. Lymph node, sentinel, biopsy, Left Axillary #1 - ONE OF ONE LYMPH NODES NEGATIVE FOR CARCINOMA (0/1). - BIOPSY SITE. 4. Lymph node, sentinel, biopsy, Left Axillary # 2 - ONE OF ONE LYMPH NODES NEGATIVE FOR CARCINOMA (0/1).      CURRENT THERAPY:  Surveillance    INTERVAL HISTORY:  Briana Price is here for a follow up of left breast cancer. She was last seen by me 7 months ago and was seen by NP Wilber Bihari for Survivorship 3 months ago.  She presents to the clinic today by herself. She notes she is doing well overall. She notes axillary boil that has burst. She still has dark mark from it. She denies any residual neuropathy.   She plans to do reconstruction surgery this summer after her son's college tours. She still has port. She notes she had a colonoscopy in there past 2 years with Dr. Collene Mares. She is up to date on Pap Smears. Her last period was 05/2017 when she had chemo.      REVIEW OF SYSTEMS:   Constitutional: Denies fevers, chills or abnormal weight loss Eyes: Denies blurriness of vision Ears, nose, mouth, throat, and face: Denies mucositis or sore throat Respiratory: Denies cough, dyspnea or wheezes Cardiovascular: Denies palpitation, chest  discomfort or lower extremity swelling Gastrointestinal:  Denies nausea, heartburn or change in bowel habits Skin: Denies abnormal skin rashes Lymphatics: Denies new lymphadenopathy or easy bruising Neurological:Denies numbness, tingling or new weaknesses Behavioral/Psych: Mood is stable, no new changes  All other systems were reviewed with the patient and are negative.  MEDICAL HISTORY:  Past Medical History:  Diagnosis Date  . Anemia   . Breast cancer ([place])   . Cancer [facility])    left breast cancer  . Family history of breast cancer   . Genetic  testing 05/28/2017   Common Cancers panel (46 genes) @ [facility] - No pathogenic mutations detected  . GERD (gastroesophageal reflux disease)   . Headache    Migraines  . Hypertension     SURGICAL HISTORY: Past Surgical History:  Procedure Laterality Date  . BREAST SURGERY     biopsy  . CESAREAN SECTION    . CHOLECYSTECTOMY N/A 02/07/2013   Procedure: LAPAROSCOPIC CHOLECYSTECTOMY WITH INTRAOPERATIVE CHOLANGIOGRAM;  Surgeon: Gwenyth Ober, MD;  Location: [place];  Service: General;  Laterality: N/A;  . COLONOSCOPY W/ BIOPSIES AND POLYPECTOMY    . FOOT SURGERY  2003   lt foot bunionectomy  . MASTECTOMY W/ SENTINEL NODE BIOPSY Bilateral 11/19/2017   Procedure: BILATERAL MASTECTOMIES WITH LEFT SENTINEL LYMPH NODE BIOPSY;  Surgeon: [name], MD;  Location: [place];  Service: General;  Laterality: Bilateral;  . MULTIPLE TOOTH EXTRACTIONS    . PORTACATH PLACEMENT Right 05/19/2017   Procedure: INSERTION PORT-A-CATH;  Surgeon: [name], MD;  Location: [place];  Service: General;  Laterality: Right;    I have reviewed the social history and family history with the patient and they are unchanged from previous note.  ALLERGIES:  has No Known Allergies.  MEDICATIONS:  Current Outpatient Medications  Medication Sig Dispense Refill  . aspirin-acetaminophen-caffeine (EXCEDRIN MIGRAINE) 250-250-65 MG tablet Take 2 tablets by mouth 2 (two) times daily as needed for headache.    Marland Kitchen atorvastatin (LIPITOR) 20 MG tablet Take 20 mg by mouth daily.  1  . Cholecalciferol (VITAMIN D) 2000 units CAPS Take 2,000 Units by mouth daily.    . ferrous sulfate 325 (65 FE) MG tablet Take 325 mg by mouth every other day.     . hydrochlorothiazide (HYDRODIURIL) 25 MG tablet Take 25 mg by mouth daily.    Marland Kitchen lisinopril (PRINIVIL,ZESTRIL) 10 MG tablet Take 10 mgs by mouth daily  2  . Multiple Vitamins-Minerals (MULTIVITAMIN ADULT PO) Take 1 tablet by mouth daily.     Current  Facility-Administered Medications  Medication Dose Route Frequency Provider Last Rate Last Dose  . heparin lock flush 100 unit/mL  500 Units Intravenous Once PRN [name], MD      . sodium chloride flush (NS) 0.9 % injection 10 mL  10 mL Intravenous PRN [name], MD   10 mL at 09/15/18 1454  . sodium chloride flush (NS) 0.9 % injection 10 mL  10 mL Intravenous PRN [name], MD       Facility-Administered Medications Ordered in Other Visits  Medication Dose Route Frequency Provider Last Rate Last Dose  . sodium chloride flush (NS) 0.9 % injection 10 mL  10 mL Intravenous PRN [name], MD   10 mL at 08/07/17 1354    PHYSICAL EXAMINATION: ECOG PERFORMANCE STATUS: 0 - Asymptomatic  Vitals:   09/15/18 1414  BP: (!) 145/94  Pulse: 79  Resp: 18  Temp: 98.2 F (36.8 C)  SpO2: 93%  Filed Weights   09/15/18 1414  Weight: 226 lb 11.2 oz (102.8 kg)    GENERAL:alert, no distress and comfortable SKIN: skin color, texture, turgor are normal, no rashes or significant lesions EYES: normal, Conjunctiva are pink and non-injected, sclera clear OROPHARYNX:no exudate, no erythema and lips, buccal mucosa, and tongue normal  NECK: supple, thyroid normal size, non-tender, without nodularity LYMPH:  no palpable lymphadenopathy in the cervical, axillary or inguinal LUNGS: clear to auscultation and percussion with normal breathing effort HEART: regular rate & rhythm and no murmurs and no lower extremity edema ABDOMEN:abdomen soft, non-tender and normal bowel sounds Musculoskeletal:no cyanosis of digits and no clubbing  NEURO: alert & oriented x 3 with fluent speech, no focal motor/sensory deficits BREAST: S/p b/l mastectomies: Surgical incisions healed well with mild color change along left breast incision. No palpable mass or adenopathy  (+) dark spot in right axilla from healed boil.   LABORATORY DATA:  I have reviewed the data as listed CBC Latest Ref Rng & Units 09/15/2018 03/05/2018 12/03/2017    WBC 4.0 - 10.5 K/uL 7.3 6.5 6.4  Hemoglobin 12.0 - 15.0 g/dL 12.6 12.5 11.2(L)  Hematocrit 36.0 - 46.0 % 37.9 37.8 34.0(L)  Platelets 150 - 400 K/uL 320 295 418(H)     CMP Latest Ref Rng & Units 09/15/2018 03/05/2018 12/03/2017  Glucose 70 - 99 mg/dL 100(H) 107(H) 103  BUN 6 - 20 mg/dL 8 12 5(L)  Creatinine 0.44 - 1.00 mg/dL 0.75 0.54 0.66  Sodium 135 - 145 mmol/L 140 142 139  Potassium 3.5 - 5.1 mmol/L 3.8 3.7 3.7  Chloride 98 - 111 mmol/L 100 104 103  CO2 22 - 32 mmol/L 32 30 29  Calcium 8.9 - 10.3 mg/dL 9.9 9.2 10.2  Total Protein 6.5 - 8.1 g/dL 8.2(H) 7.9 7.2  Total Bilirubin 0.3 - 1.2 mg/dL 1.1 0.9 0.7  Alkaline Phos 38 - 126 U/L 94 68 65  AST 15 - 41 U/L _0 ALT 0 - 44 U/L _1 RADIOGRAPHIC STUDIES: I have personally reviewed the radiological images as listed and agreed with the findings in the report. No results found.   ASSESSMENT & PLAN:  KINZLIE HARNEY is a 53 y.o. female with   1. 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 ypT0N0 -She was diagnosed in 04/2017. She is s/p neoadjuvant AC-T and b/l mastectomy. She has recovered well from treatment.  -She plans to post-pone breast reconstruction until June/July 2020.  [facility] is still in place. Will remove with breast reconstruction. Will continue port flush every 6 weeks. -She is clinically doing well. Lab reviewed, her CBC is WNL, and CMP still pending. Her physical exam is unremarkable overall. There is no clinical concern for recurrence. Continue breast cancer surveillance.  -She is s/p b/l mastectomy, no need for repeat mammograms.  -F/u in 4 months   2. 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  3. HTN -Continue follow-up with her primary care physician  -she is on HCTZ and  lisinopril. Will continue -BP at 145/94 (09/15/18)   4. Lung nodules, Subcapsular spleen cyst  -Her restaging CT scan showed a 1m subcapsular cyst and tiny right lung nodule, 1-2 mm, likely benign, follow-up recommended.  She never smoked, low risk for lung cancer. -12/28/17 CT CAP  showed stable cyst and nodule, no further surveillance scan is needed.   5. Cancer screenings  -She had a colonoscopy with Dr. Collene Mares in the past 2 years, benign  -She is up to date on her Pap Smears, will continue  -Her last period was 05/2017 during chemo. No menopausal symptoms present. Still likely pre-menopausal. Will wait on bone density scan.     PLAN:  -Port flush today  -Lab and f/u in 4 months, or 6-8 months if she will see Dr. [name] in 3-4 months  -Port flush every 6 weeks X3    No problem-specific Assessment & Plan notes found for this encounter.   No orders of the defined types were placed in this encounter.  All questions were answered. The patient knows to call the clinic with any problems, questions or concerns. No barriers to learning was detected. I spent 15 minutes counseling the patient face to face. The total time spent in the appointment was 20 minutes and more than 50% was on counseling and review of test results     [name], MD 09/15/2018   I, Joslyn Devon, am acting as scribe for [name], MD.   I have reviewed the above documentation for accuracy and completeness, and I agree with the above.

## 2018-09-15 ENCOUNTER — Telehealth: Payer: Self-pay | Admitting: Hematology

## 2018-09-15 ENCOUNTER — Inpatient Hospital Stay (HOSPITAL_BASED_OUTPATIENT_CLINIC_OR_DEPARTMENT_OTHER): Payer: Federal, State, Local not specified - PPO | Admitting: Hematology

## 2018-09-15 ENCOUNTER — Encounter: Payer: Self-pay | Admitting: Hematology

## 2018-09-15 ENCOUNTER — Inpatient Hospital Stay: Payer: Federal, State, Local not specified - PPO | Attending: Hematology

## 2018-09-15 VITALS — BP 145/94 | HR 79 | Temp 98.2°F | Resp 18 | Ht 66.0 in | Wt 226.7 lb

## 2018-09-15 DIAGNOSIS — Z95828 Presence of other vascular implants and grafts: Secondary | ICD-10-CM | POA: Insufficient documentation

## 2018-09-15 DIAGNOSIS — Z7982 Long term (current) use of aspirin: Secondary | ICD-10-CM

## 2018-09-15 DIAGNOSIS — C50512 Malignant neoplasm of lower-outer quadrant of left female breast: Secondary | ICD-10-CM

## 2018-09-15 DIAGNOSIS — R918 Other nonspecific abnormal finding of lung field: Secondary | ICD-10-CM

## 2018-09-15 DIAGNOSIS — Z79899 Other long term (current) drug therapy: Secondary | ICD-10-CM

## 2018-09-15 DIAGNOSIS — Z853 Personal history of malignant neoplasm of breast: Secondary | ICD-10-CM | POA: Insufficient documentation

## 2018-09-15 DIAGNOSIS — I10 Essential (primary) hypertension: Secondary | ICD-10-CM | POA: Diagnosis not present

## 2018-09-15 DIAGNOSIS — K219 Gastro-esophageal reflux disease without esophagitis: Secondary | ICD-10-CM

## 2018-09-15 DIAGNOSIS — Z9221 Personal history of antineoplastic chemotherapy: Secondary | ICD-10-CM | POA: Diagnosis not present

## 2018-09-15 DIAGNOSIS — Z452 Encounter for adjustment and management of vascular access device: Secondary | ICD-10-CM | POA: Insufficient documentation

## 2018-09-15 DIAGNOSIS — Z171 Estrogen receptor negative status [ER-]: Secondary | ICD-10-CM | POA: Diagnosis not present

## 2018-09-15 DIAGNOSIS — Z9013 Acquired absence of bilateral breasts and nipples: Secondary | ICD-10-CM | POA: Insufficient documentation

## 2018-09-15 LAB — CBC WITH DIFFERENTIAL/PLATELET
ABS IMMATURE GRANULOCYTES: 0.02 10*3/uL (ref 0.00–0.07)
BASOS ABS: 0.1 10*3/uL (ref 0.0–0.1)
Basophils Relative: 1 %
Eosinophils Absolute: 0.1 10*3/uL (ref 0.0–0.5)
Eosinophils Relative: 2 %
HCT: 37.9 % (ref 36.0–46.0)
Hemoglobin: 12.6 g/dL (ref 12.0–15.0)
Immature Granulocytes: 0 %
Lymphocytes Relative: 26 %
Lymphs Abs: 1.9 10*3/uL (ref 0.7–4.0)
MCH: 28.1 pg (ref 26.0–34.0)
MCHC: 33.2 g/dL (ref 30.0–36.0)
MCV: 84.4 fL (ref 80.0–100.0)
Monocytes Absolute: 0.6 10*3/uL (ref 0.1–1.0)
Monocytes Relative: 8 %
NEUTROS ABS: 4.7 10*3/uL (ref 1.7–7.7)
NEUTROS PCT: 63 %
Platelets: 320 10*3/uL (ref 150–400)
RBC: 4.49 MIL/uL (ref 3.87–5.11)
RDW: 12.8 % (ref 11.5–15.5)
WBC: 7.3 10*3/uL (ref 4.0–10.5)
nRBC: 0 % (ref 0.0–0.2)

## 2018-09-15 LAB — COMPREHENSIVE METABOLIC PANEL
ALT: 29 U/L (ref 0–44)
ANION GAP: 8 (ref 5–15)
AST: 27 U/L (ref 15–41)
Albumin: 4 g/dL (ref 3.5–5.0)
Alkaline Phosphatase: 94 U/L (ref 38–126)
BUN: 8 mg/dL (ref 6–20)
CO2: 32 mmol/L (ref 22–32)
Calcium: 9.9 mg/dL (ref 8.9–10.3)
Chloride: 100 mmol/L (ref 98–111)
Creatinine, Ser: 0.75 mg/dL (ref 0.44–1.00)
GFR calc non Af Amer: >60 mL/min (ref >60)
Glucose, Bld: 100 mg/dL — ABNORMAL HIGH (ref 70–99)
Potassium: 3.8 mmol/L (ref 3.5–5.1)
Sodium: 140 mmol/L (ref 135–145)
Total Bilirubin: 1.1 mg/dL (ref 0.3–1.2)
Total Protein: 8.2 g/dL — ABNORMAL HIGH (ref 6.5–8.1)

## 2018-09-15 MED ORDER — SODIUM CHLORIDE 0.9% FLUSH
10.0000 mL | INTRAVENOUS | Status: DC | PRN
  Administered 2018-09-15: 10 mL via INTRAVENOUS
  Filled 2018-09-15: qty 10

## 2018-09-15 MED ORDER — SODIUM CHLORIDE 0.9% FLUSH
10.0000 mL | INTRAVENOUS | Status: DC | PRN
  Filled 2018-09-15: qty 10

## 2018-09-15 MED ORDER — HEPARIN SOD (PORK) LOCK FLUSH 100 UNIT/ML IV SOLN
500.0000 [IU] | Freq: Once | INTRAVENOUS | Status: AC
  Administered 2018-09-15: 500 [IU] via INTRAVENOUS
  Filled 2018-09-15: qty 5

## 2018-09-15 MED ORDER — HEPARIN SOD (PORK) LOCK FLUSH 100 UNIT/ML IV SOLN
500.0000 [IU] | Freq: Once | INTRAVENOUS | Status: DC | PRN
  Filled 2018-09-15: qty 5

## 2018-09-15 NOTE — Telephone Encounter (Signed)
Printed calendar and avs. °

## 2018-09-22 ENCOUNTER — Telehealth: Payer: Self-pay

## 2018-09-22 NOTE — Telephone Encounter (Signed)
Spoke with patient concerning the rescheduling of her appointment time being changed to late afternoon due to employment. Per voice msg return calls. May be running a few minutes late.

## 2018-10-01 DIAGNOSIS — S21009A Unspecified open wound of unspecified breast, initial encounter: Secondary | ICD-10-CM | POA: Diagnosis not present

## 2018-10-01 DIAGNOSIS — C50512 Malignant neoplasm of lower-outer quadrant of left female breast: Secondary | ICD-10-CM | POA: Diagnosis not present

## 2018-10-27 ENCOUNTER — Inpatient Hospital Stay: Payer: Federal, State, Local not specified - PPO | Attending: Hematology

## 2018-10-27 DIAGNOSIS — Z9221 Personal history of antineoplastic chemotherapy: Secondary | ICD-10-CM | POA: Insufficient documentation

## 2018-10-27 DIAGNOSIS — Z452 Encounter for adjustment and management of vascular access device: Secondary | ICD-10-CM | POA: Insufficient documentation

## 2018-10-27 DIAGNOSIS — Z9013 Acquired absence of bilateral breasts and nipples: Secondary | ICD-10-CM | POA: Diagnosis not present

## 2018-10-27 DIAGNOSIS — Z853 Personal history of malignant neoplasm of breast: Secondary | ICD-10-CM | POA: Insufficient documentation

## 2018-10-27 DIAGNOSIS — Z171 Estrogen receptor negative status [ER-]: Secondary | ICD-10-CM | POA: Insufficient documentation

## 2018-10-27 DIAGNOSIS — Z95828 Presence of other vascular implants and grafts: Secondary | ICD-10-CM | POA: Insufficient documentation

## 2018-10-27 MED ORDER — SODIUM CHLORIDE 0.9% FLUSH
10.0000 mL | INTRAVENOUS | Status: DC | PRN
  Administered 2018-10-27: 10 mL via INTRAVENOUS
  Filled 2018-10-27: qty 10

## 2018-10-27 MED ORDER — HEPARIN SOD (PORK) LOCK FLUSH 100 UNIT/ML IV SOLN
500.0000 [IU] | Freq: Once | INTRAVENOUS | Status: AC | PRN
  Administered 2018-10-27: 500 [IU] via INTRAVENOUS
  Filled 2018-10-27: qty 5

## 2018-11-05 DIAGNOSIS — I10 Essential (primary) hypertension: Secondary | ICD-10-CM | POA: Diagnosis not present

## 2018-11-05 DIAGNOSIS — E782 Mixed hyperlipidemia: Secondary | ICD-10-CM | POA: Diagnosis not present

## 2018-11-05 DIAGNOSIS — D509 Iron deficiency anemia, unspecified: Secondary | ICD-10-CM | POA: Diagnosis not present

## 2018-12-08 ENCOUNTER — Inpatient Hospital Stay: Payer: Federal, State, Local not specified - PPO

## 2018-12-31 ENCOUNTER — Other Ambulatory Visit: Payer: Self-pay

## 2018-12-31 ENCOUNTER — Inpatient Hospital Stay: Payer: Federal, State, Local not specified - PPO | Attending: Hematology

## 2018-12-31 DIAGNOSIS — Z9013 Acquired absence of bilateral breasts and nipples: Secondary | ICD-10-CM | POA: Insufficient documentation

## 2018-12-31 DIAGNOSIS — Z9221 Personal history of antineoplastic chemotherapy: Secondary | ICD-10-CM | POA: Diagnosis not present

## 2018-12-31 DIAGNOSIS — Z853 Personal history of malignant neoplasm of breast: Secondary | ICD-10-CM | POA: Insufficient documentation

## 2018-12-31 DIAGNOSIS — Z452 Encounter for adjustment and management of vascular access device: Secondary | ICD-10-CM | POA: Diagnosis not present

## 2018-12-31 DIAGNOSIS — Z95828 Presence of other vascular implants and grafts: Secondary | ICD-10-CM

## 2018-12-31 DIAGNOSIS — Z171 Estrogen receptor negative status [ER-]: Secondary | ICD-10-CM | POA: Insufficient documentation

## 2018-12-31 MED ORDER — SODIUM CHLORIDE 0.9% FLUSH
10.0000 mL | INTRAVENOUS | Status: DC | PRN
  Administered 2018-12-31: 10 mL via INTRAVENOUS
  Filled 2018-12-31: qty 10

## 2018-12-31 MED ORDER — HEPARIN SOD (PORK) LOCK FLUSH 100 UNIT/ML IV SOLN
500.0000 [IU] | Freq: Once | INTRAVENOUS | Status: AC | PRN
  Administered 2018-12-31: 16:00:00 500 [IU] via INTRAVENOUS
  Filled 2018-12-31: qty 5

## 2019-01-05 ENCOUNTER — Telehealth: Payer: Self-pay | Admitting: Hematology

## 2019-01-05 NOTE — Telephone Encounter (Signed)
Per sch msg, called patient to see if she would like to postpone 5/11 appt for a month. Patient would like to keep appt. Left as is

## 2019-01-14 NOTE — Progress Notes (Signed)
[place]   [phone] [phone]   Clinic Follow up Note   Patient Care Team: [name], MD as PCP - General (Family Medicine) [name], MD as Consulting Physician (Hematology) [name], MD as Consulting Physician (General Surgery) [name], MD as Consulting Physician (Radiation Oncology) Gardenia Phlegm, NP as Nurse Practitioner (Hematology and Oncology)  Date of Service:  01/17/2019  CHIEF COMPLAINT: Follow up left breast cancer, triple negative  SUMMARY OF ONCOLOGIC HISTORY: Oncology History   Cancer Staging Malignant neoplasm of lower-outer quadrant of left breast of female, estrogen receptor negative ([place]) Staging form: Breast, AJCC 8th Edition - Clinical: Stage IIB (cT2, cN0(f), cM0, G3, ER: Negative, PR: Negative, HER2: Negative) - Unsigned       Malignant neoplasm of lower-outer quadrant of left breast of female, estrogen receptor negative ([place])   04/27/2017 Mammogram    IMPRESSION: 1. Highly suspicious mass within the LEFT breast at the 5:30 o'clock axis, 4 cm from the nipple, measuring 2.5 cm, corresponding to the mammographic finding and corresponding to 1 of the palpable lumps in the left breast. Ultrasound-guided biopsy is recommended. 2. Mildly prominent lymph node in the LEFT axilla, with normal cortical thickness but with questionable effacement of the fatty hilum. Ultrasound-guided core biopsy is recommended. 3. No evidence of malignancy within the RIGHT breast. Also, no evidence of malignancy within the second area of clinical concern in the upper outer left breast.    04/29/2017 Initial Diagnosis    Malignant neoplasm of lower-outer quadrant of left breast of female, estrogen receptor negative ([place])    04/29/2017 Receptors her2    Estrogen Receptor: 0%, NEGATIVE Progesterone Receptor: 0%, NEGATIVE Proliferation Marker Ki67: 90% HER2 NEGATIVE     04/29/2017 Initial Biopsy     Diagnosis 1.  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 - SEE COMMENT. 2. Lymph node, needle/core biopsy, left axilla - THERE IN NO EVIDENCE OF CARCINOMA IN 1 OF 1 LYMPH NODE (0/1).    05/14/2017 Imaging    MRI of Breast Bilateral 05/14/17  IMPRESSION: 1. 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 2. Non mass enhancement extending approximately 3.2 cm anterior to the mass in the lower inner quadrant of the left breast, suspicious for DCIS. There is no correlate on recent mammography. The overall extent of the mass and non mass enhancement is approximately 5 cm. 3. No MRI evidence of malignancy involving the right breast. 4. No pathologic lymphadenopathy. Upper normal sized left axillary lymph nodes, the largest of which was previously biopsied no evidence of metastatic disease.    05/15/2017 Imaging    Bone scan whole body 05/15/17 IMPRESSION: No findings specific for metastatic disease to bone.    05/15/2017 Imaging    CT CAP W contrast 05/15/17  IMPRESSION: 1. 18 mm soft tissue lesion inferior left breast compatible with known primary malignancy. 2. Mildly enlarged left axillary lymph nodes in this patient with biopsy-proven metastatic disease to the left axilla. 3. No evidence for lymphadenopathy elsewhere in the chest. No lymphadenopathy in the abdomen or pelvis. No evidence for metastatic disease in the abdomen or pelvis. 4. Tiny right perifissural lung nodule most likely subpleural lymph node. Attention on follow-up recommended. 5. 7 mm hypoattenuating lesion in the spleen, likely a cyst or pseudocyst. Attention on follow-up recommended. 6. Uterine fibroids. 7.  Aortic  Atherosclerois (ICD10-170.0)    05/19/2017 Procedure    Port placement by Dr. [name] on 9/11/8    05/22/2017 - 10/09/2017 Chemotherapy      neoadjuvant chemotherpy AC every 2 weeks for 4 cycles starting 05/22/17 and completed on 07/07/17, followed by weekly paclitaxel for 12 cycles starting 07/24/17 to complete on 10/09/17     05/22/2017 Pathology Results    Diagnosis Breast, left, needle core biopsy, lower inner quad - DUCTAL CARCINOMA IN SITU. 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  The genes analyzed were the 46 genes on [facility]'s Common Cancers panel (APC, ATM, AXIN2, BARD1, BMPR1A, BRCA1, BRCA2, BRIP1, CDH1, CDKN2A, CHEK2, CTNNA1, DICER1, EPCAM, GREM1, HOXB13, KIT, MEN1, MLH1, MSH2, MSH3, MSH6, MUTYH, NBN, NF1, NTHL1, PALB2, PDGFRA, PMS2, POLD1, POLE, PTEN, RAD50, RAD51C, RAD51D, SDHA, SDHB, SDHC, SDHD, SMAD4, SMARCA4, STK11, TP53, TSC1, TSC2, VHL).      08/14/2017 Echocardiogram    EF 60-65%  Study Conclusions  - Left ventricle: The cavity size was normal. Systolic function was   normal. The estimated ejection fraction was in the range of 60%   to 65%. Wall motion was normal; there were no regional wall   motion abnormalities. Doppler parameters are consistent with   abnormal left ventricular relaxation (grade 1 diastolic   dysfunction). - Pulmonary arteries: PA peak pressure: 31 mm Hg (S).  Impressions:  - No change from September 2018 study (strain rate not performed on   that study).       10/14/2017 Imaging    MRI Breast Bilateral  IMPRESSION:  Significant improvement following neoadjuvant treatment.  No residual enhancement in the left breast at sites of known malignancy.  RECOMMENDATION: Treatment plan for known malignancy.    11/19/2017 Surgery    BILATERAL MASTECTOMIES WITH LEFT SENTINEL LYMPH NODE BIOPSY by Dr. [name] 11/19/17    11/19/2017 Pathology Results     Diagnosis 11/19/17 1. Breast, simple mastectomy, Right - FIBROCYSTIC AND FIBROADENOMATOID CHANGE. - NO  MALIGNANCY IDENTIFIED. 2. Breast, simple mastectomy, Left - NO RESIDUAL CARCINOMA IDENTIFIED. - BIOPSY SITES. - FIBROCYSTIC AND FIBROADENOMATOID CHANGE. - SEE ONCOLOGY TABLE. 3. Lymph node, sentinel, biopsy, Left Axillary #1 - ONE OF ONE LYMPH NODES NEGATIVE FOR CARCINOMA (0/1). - BIOPSY SITE. 4. Lymph node, sentinel, biopsy, Left Axillary # 2 - ONE OF ONE LYMPH NODES NEGATIVE FOR CARCINOMA (0/1).      CURRENT THERAPY:  Surveillance  INTERVAL HISTORY:  Briana Price is here for a follow up of left breast cancer. She presents to the clinic today by herself. She notes she is doing well. She notes vertigo dizziness had started 2-3 months ago. She was diagnosed about 10 years ago an has flares. She notes lately this has been often about 2-3 times a day and is not related to the position of the head. When she feels dizzy she feels her head is spinning. This lasts about less than a minutes at a time. She notes her vision is blurry overall and she feels she needs glasses more consistently.  She denies balance issues or falls. She denies HA or nerve issues. She denies ringing of ears and denies being dehydrated. She has not seen her PCP about this yet. She denies any new changes to her medications.     REVIEW OF SYSTEMS:   Constitutional: Denies fevers, chills or abnormal weight loss Eyes: (+) mild blurriness of vision  Ears, nose, mouth, throat, and face: Denies mucositis or sore throat Respiratory: Denies cough, dyspnea or wheezes Cardiovascular: Denies palpitation, chest discomfort or lower extremity swelling Gastrointestinal:  Denies nausea, heartburn or change in bowel habits Skin: Denies abnormal skin rashes Lymphatics: Denies new lymphadenopathy or easy bruising Neurological:Denies numbness, tingling or new weaknesses Behavioral/Psych: Mood is stable, no new changes  All other systems were reviewed with the patient and are negative.  MEDICAL HISTORY:  Past Medical History:   Diagnosis Date  . Anemia   . Breast cancer ([place])   . Cancer [facility])    left breast cancer  . Family history of breast cancer   . Genetic testing 05/28/2017   Common Cancers panel (46 genes) @ [facility] - No pathogenic mutations detected  . GERD (gastroesophageal reflux disease)   . Headache    Migraines  . Hypertension     SURGICAL HISTORY: Past Surgical History:  Procedure Laterality Date  . BREAST SURGERY     biopsy  . CESAREAN SECTION    . CHOLECYSTECTOMY N/A 02/07/2013   Procedure: LAPAROSCOPIC CHOLECYSTECTOMY WITH INTRAOPERATIVE CHOLANGIOGRAM;  Surgeon: Gwenyth Ober, MD;  Location: [place];  Service: General;  Laterality: N/A;  . COLONOSCOPY W/ BIOPSIES AND POLYPECTOMY    . FOOT SURGERY  2003   lt foot bunionectomy  . MASTECTOMY W/ SENTINEL NODE BIOPSY Bilateral 11/19/2017   Procedure: BILATERAL MASTECTOMIES WITH LEFT SENTINEL LYMPH NODE BIOPSY;  Surgeon: [name], MD;  Location: [place];  Service: General;  Laterality: Bilateral;  . MULTIPLE TOOTH EXTRACTIONS    . PORTACATH PLACEMENT Right 05/19/2017   Procedure: INSERTION PORT-A-CATH;  Surgeon: [name], MD;  Location: [place];  Service: General;  Laterality: Right;    I have reviewed the social history and family history with the patient and they are unchanged from previous note.  ALLERGIES:  has No Known Allergies.  MEDICATIONS:  Current Outpatient Medications  Medication Sig Dispense Refill  . aspirin-acetaminophen-caffeine (EXCEDRIN MIGRAINE) 250-250-65 MG tablet Take 2 tablets by mouth 2 (two) times daily as needed for headache.    Marland Kitchen atorvastatin (LIPITOR) 20 MG tablet Take 20 mg by mouth daily.  1  . Cholecalciferol (VITAMIN D) 2000 units CAPS Take 2,000 Units by mouth daily.    . ferrous sulfate 325 (65 FE) MG tablet Take 325 mg by mouth every other day.     . hydrochlorothiazide (HYDRODIURIL) 25 MG tablet Take 25 mg by mouth daily.    Marland Kitchen lisinopril (PRINIVIL,ZESTRIL) 10  MG tablet Take 10 mgs by mouth daily  2  . Multiple Vitamins-Minerals (MULTIVITAMIN ADULT PO) Take 1 tablet by mouth daily.     No current facility-administered medications for this visit.    Facility-Administered Medications Ordered in Other Visits  Medication Dose Route Frequency Provider Last Rate Last Dose  . heparin lock flush 100 unit/mL  500 Units Intravenous Once PRN [name], MD      . sodium chloride flush (NS) 0.9 % injection 10 mL  10 mL Intravenous PRN [name], MD   10 mL at 08/07/17 1354  . sodium chloride flush (NS) 0.9 % injection 10 mL  10 mL Intravenous PRN [name], MD        PHYSICAL EXAMINATION: ECOG PERFORMANCE STATUS: 1 - Symptomatic but completely ambulatory  Vitals:   01/17/19 1354  BP: (!) 138/94  Pulse: 92  Resp: 18  Temp: 98 F (36.7 C)  SpO2: 99%   Filed Weights   01/17/19  1354  Weight: 237 lb (107.5 kg)    GENERAL:alert, no distress and comfortable SKIN: skin color, texture, turgor are normal, no rashes or significant lesions EYES: normal, Conjunctiva are pink and non-injected, sclera clear OROPHARYNX:no exudate, no erythema and lips, buccal mucosa, and tongue normal  NECK: supple, thyroid normal size, non-tender, without nodularity LYMPH:  no palpable lymphadenopathy in the cervical, axillary or inguinal LUNGS: clear to auscultation and percussion with normal breathing effort HEART: regular rate & rhythm and no murmurs and no lower extremity edema ABDOMEN:abdomen soft, non-tender and normal bowel sounds Musculoskeletal:no cyanosis of digits and no clubbing  NEURO: alert & oriented x 3 with fluent speech, no focal motor/sensory deficits BREAST: S/p b/l mastectomy: Surgical incisions healed well (+) No palpable mass or adenopathy in either breasts   LABORATORY DATA:  I have reviewed the data as listed CBC Latest Ref Rng & Units 01/17/2019 09/15/2018 03/05/2018  WBC 4.0 - 10.5 K/uL 8.5 7.3 6.5  Hemoglobin 12.0 - 15.0 g/dL 14.0 12.6 12.5   Hematocrit 36.0 - 46.0 % 42.0 37.9 37.8  Platelets 150 - 400 K/uL 380 320 295     CMP Latest Ref Rng & Units 01/17/2019 09/15/2018 03/05/2018  Glucose 70 - 99 mg/dL 123(H) 100(H) 107(H)  BUN 6 - 20 mg/dL '8 8 12  '$ Creatinine 0.44 - 1.00 mg/dL 0.86 0.75 0.54  Sodium 135 - 145 mmol/L 138 140 142  Potassium 3.5 - 5.1 mmol/L 3.7 3.8 3.7  Chloride 98 - 111 mmol/L 98 100 104  CO2 22 - 32 mmol/L 29 32 30  Calcium 8.9 - 10.3 mg/dL 10.1 9.9 9.2  Total Protein 6.5 - 8.1 g/dL 8.4(H) 8.2(H) 7.9  Total Bilirubin 0.3 - 1.2 mg/dL 0.7 1.1 0.9  Alkaline Phos 38 - 126 U/L 98 94 68  AST 15 - 41 U/L '31 27 28  '$ ALT 0 - 44 U/L 39 29 30      RADIOGRAPHIC STUDIES: I have personally reviewed the radiological images as listed and agreed with the findings in the report. No results found.   ASSESSMENT & PLAN:  Briana Price is a 53 y.o. female with   1. 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 She is s/p neoadjuvant AC-T and b/l mastectomy. She has recovered well from treatment.  -She plans to post-pone breast reconstruction until Fall-Winter of 2020.  -Port is still in place. Will remove with breast reconstruction. Will continue port flush every 6-8 weeks -From a breast cancer standpoint she is clinically doing well. Lab reviewed, her CBCis WNL, and CMPstill pending. Her physical examisunremarkable overall. There is no clinical concern for recurrence. Continue breast cancer surveillance.  -She is s/p b/l mastectomy, no need for repeat mammograms.  -F/u in 3 months   2. Dizziness  -Diagnosed with Vertigo at least 10 years ago with flares intermittently over the years.  -Pt suspects she has been having vertigo episodes more often lately which is not positional  -She denies pain, HA, or nerve changes. Her neurological exam was unremarkable today  (01/17/19) -I encouraged her to drink more water.  -I discussed breast cancer can metastasize in the brain, although her symptom is not highly concerning. Will monitor the next 2 weeks, if worsens or does not improve will obtain brian MRI.  -She should also discuss this with her PCP as she may need an ENT referral.   3.  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  4. HTN -Continue follow-up with her primary care physician  -she is on HCTZ and lisinopril. Will continue -BPat 138/94 (01/17/19)   5. Lung nodules, Subcapsular spleen cyst -Her restaging CT scan showeda 23m subcapsular cyst andtiny right lung nodule, 1-2 mm, likely benign, follow-up recommended. She never smoked, low risk for lung cancer. -12/28/17 CT CAP showed stable cyst and nodule, no further surveillance scan is needed.   6. Cancer screenings  -She had a colonoscopy with Dr. MCollene Maresin the past 2 years, benign  -She is up to date on her Pap Smears, will continue  -Her last period was 05/2017 during chemo. No menopausal symptoms present. Still likely pre-menopausal. Will wait on bone density scan.      PLAN: Port flush in 4 weeks  Lab, flush and f/u in 3 months  She will call me if her dizziness gets worse, and I may obtain a brain MRI   No problem-specific Assessment & Plan notes found for this encounter.   No orders of the defined types were placed in this encounter.  All questions were answered. The patient knows to call the clinic with any problems, questions or concerns. No barriers to learning was detected. I spent 20 minutes counseling the patient face to face. The total time spent in the appointment was 25 minutes and more than 50% was on counseling and review of test results     YTruitt Merle MD 01/17/2019   I, AJoslyn Devon am acting as scribe for YTruitt Merle MD.   I have reviewed the above  documentation for accuracy and completeness, and I agree with the above.

## 2019-01-17 ENCOUNTER — Other Ambulatory Visit: Payer: Self-pay

## 2019-01-17 ENCOUNTER — Inpatient Hospital Stay: Payer: Federal, State, Local not specified - PPO | Attending: Hematology

## 2019-01-17 ENCOUNTER — Inpatient Hospital Stay (HOSPITAL_BASED_OUTPATIENT_CLINIC_OR_DEPARTMENT_OTHER): Payer: Federal, State, Local not specified - PPO | Admitting: Hematology

## 2019-01-17 ENCOUNTER — Inpatient Hospital Stay: Payer: Federal, State, Local not specified - PPO

## 2019-01-17 ENCOUNTER — Encounter: Payer: Self-pay | Admitting: Hematology

## 2019-01-17 ENCOUNTER — Telehealth: Payer: Self-pay | Admitting: Hematology

## 2019-01-17 VITALS — BP 138/94 | HR 92 | Temp 98.0°F | Resp 18 | Ht 66.0 in | Wt 237.0 lb

## 2019-01-17 DIAGNOSIS — Z95828 Presence of other vascular implants and grafts: Secondary | ICD-10-CM | POA: Insufficient documentation

## 2019-01-17 DIAGNOSIS — I10 Essential (primary) hypertension: Secondary | ICD-10-CM

## 2019-01-17 DIAGNOSIS — R918 Other nonspecific abnormal finding of lung field: Secondary | ICD-10-CM | POA: Insufficient documentation

## 2019-01-17 DIAGNOSIS — H538 Other visual disturbances: Secondary | ICD-10-CM

## 2019-01-17 DIAGNOSIS — Z803 Family history of malignant neoplasm of breast: Secondary | ICD-10-CM | POA: Diagnosis not present

## 2019-01-17 DIAGNOSIS — R42 Dizziness and giddiness: Secondary | ICD-10-CM | POA: Diagnosis not present

## 2019-01-17 DIAGNOSIS — Z171 Estrogen receptor negative status [ER-]: Secondary | ICD-10-CM

## 2019-01-17 DIAGNOSIS — Z9221 Personal history of antineoplastic chemotherapy: Secondary | ICD-10-CM | POA: Insufficient documentation

## 2019-01-17 DIAGNOSIS — Z7982 Long term (current) use of aspirin: Secondary | ICD-10-CM | POA: Insufficient documentation

## 2019-01-17 DIAGNOSIS — Z9013 Acquired absence of bilateral breasts and nipples: Secondary | ICD-10-CM

## 2019-01-17 DIAGNOSIS — C50512 Malignant neoplasm of lower-outer quadrant of left female breast: Secondary | ICD-10-CM

## 2019-01-17 DIAGNOSIS — Z79899 Other long term (current) drug therapy: Secondary | ICD-10-CM | POA: Insufficient documentation

## 2019-01-17 DIAGNOSIS — Z853 Personal history of malignant neoplasm of breast: Secondary | ICD-10-CM | POA: Diagnosis not present

## 2019-01-17 LAB — CBC WITH DIFFERENTIAL/PLATELET
Abs Immature Granulocytes: 0.04 10*3/uL (ref 0.00–0.07)
Basophils Absolute: 0.1 10*3/uL (ref 0.0–0.1)
Basophils Relative: 1 %
Eosinophils Absolute: 0.1 10*3/uL (ref 0.0–0.5)
Eosinophils Relative: 1 %
HCT: 42 % (ref 36.0–46.0)
Hemoglobin: 14 g/dL (ref 12.0–15.0)
Immature Granulocytes: 1 %
Lymphocytes Relative: 22 %
Lymphs Abs: 1.8 10*3/uL (ref 0.7–4.0)
MCH: 28.1 pg (ref 26.0–34.0)
MCHC: 33.3 g/dL (ref 30.0–36.0)
MCV: 84.3 fL (ref 80.0–100.0)
Monocytes Absolute: 0.6 10*3/uL (ref 0.1–1.0)
Monocytes Relative: 7 %
Neutro Abs: 5.8 10*3/uL (ref 1.7–7.7)
Neutrophils Relative %: 68 %
Platelets: 380 10*3/uL (ref 150–400)
RBC: 4.98 MIL/uL (ref 3.87–5.11)
RDW: 12.6 % (ref 11.5–15.5)
WBC: 8.5 10*3/uL (ref 4.0–10.5)
nRBC: 0 % (ref 0.0–0.2)

## 2019-01-17 LAB — CMP (CANCER CENTER ONLY)
ALT: 39 U/L (ref 0–44)
AST: 31 U/L (ref 15–41)
Albumin: 4 g/dL (ref 3.5–5.0)
Alkaline Phosphatase: 98 U/L (ref 38–126)
Anion gap: 11 (ref 5–15)
BUN: 8 mg/dL (ref 6–20)
CO2: 29 mmol/L (ref 22–32)
Calcium: 10.1 mg/dL (ref 8.9–10.3)
Chloride: 98 mmol/L (ref 98–111)
Creatinine: 0.86 mg/dL (ref 0.44–1.00)
GFR, Est AFR Am: >60 mL/min (ref >60)
GFR, Estimated: >60 mL/min (ref >60)
Glucose, Bld: 123 mg/dL — ABNORMAL HIGH (ref 70–99)
Potassium: 3.7 mmol/L (ref 3.5–5.1)
Sodium: 138 mmol/L (ref 135–145)
Total Bilirubin: 0.7 mg/dL (ref 0.3–1.2)
Total Protein: 8.4 g/dL — ABNORMAL HIGH (ref 6.5–8.1)

## 2019-01-17 MED ORDER — SODIUM CHLORIDE 0.9% FLUSH
10.0000 mL | INTRAVENOUS | Status: AC | PRN
  Filled 2019-01-17: qty 10

## 2019-01-17 MED ORDER — HEPARIN SOD (PORK) LOCK FLUSH 100 UNIT/ML IV SOLN
500.0000 [IU] | Freq: Once | INTRAVENOUS | Status: AC | PRN
  Filled 2019-01-17: qty 5

## 2019-01-17 NOTE — Telephone Encounter (Signed)
Scheduled appt per 5/11 los. ° °A calendar will be mailed out. °

## 2019-02-14 ENCOUNTER — Inpatient Hospital Stay: Payer: Federal, State, Local not specified - PPO

## 2019-02-18 ENCOUNTER — Other Ambulatory Visit: Payer: Self-pay

## 2019-02-18 ENCOUNTER — Inpatient Hospital Stay: Payer: Federal, State, Local not specified - PPO | Attending: Hematology

## 2019-02-18 DIAGNOSIS — Z853 Personal history of malignant neoplasm of breast: Secondary | ICD-10-CM | POA: Insufficient documentation

## 2019-02-18 DIAGNOSIS — Z95828 Presence of other vascular implants and grafts: Secondary | ICD-10-CM

## 2019-02-18 DIAGNOSIS — Z452 Encounter for adjustment and management of vascular access device: Secondary | ICD-10-CM | POA: Insufficient documentation

## 2019-02-18 DIAGNOSIS — C50512 Malignant neoplasm of lower-outer quadrant of left female breast: Secondary | ICD-10-CM | POA: Diagnosis not present

## 2019-02-18 MED ORDER — HEPARIN SOD (PORK) LOCK FLUSH 100 UNIT/ML IV SOLN
500.0000 [IU] | Freq: Once | INTRAVENOUS | Status: AC | PRN
  Administered 2019-02-18: 500 [IU] via INTRAVENOUS
  Filled 2019-02-18: qty 5

## 2019-02-18 MED ORDER — SODIUM CHLORIDE 0.9% FLUSH
10.0000 mL | INTRAVENOUS | Status: DC | PRN
  Administered 2019-02-18: 10 mL via INTRAVENOUS
  Filled 2019-02-18: qty 10

## 2019-02-24 IMAGING — US ULTRASOUND RIGHT BREAST LIMITED
1 series · 2 of 2 positions shown · non-contrast
Comparison: Previous exam(s).

CLINICAL DATA: Patient describes palpable masses within each breast
(1 on the right and 2 on the left).

EXAM:
2D DIGITAL DIAGNOSTIC BILATERAL MAMMOGRAM WITH CAD AND ADJUNCT TOMO
ULTRASOUND BILATERAL BREAST

[Series 1: ultrasound right breast limited · 0.06mm/px · 2 of 2 slices shown]
[im 1/2]
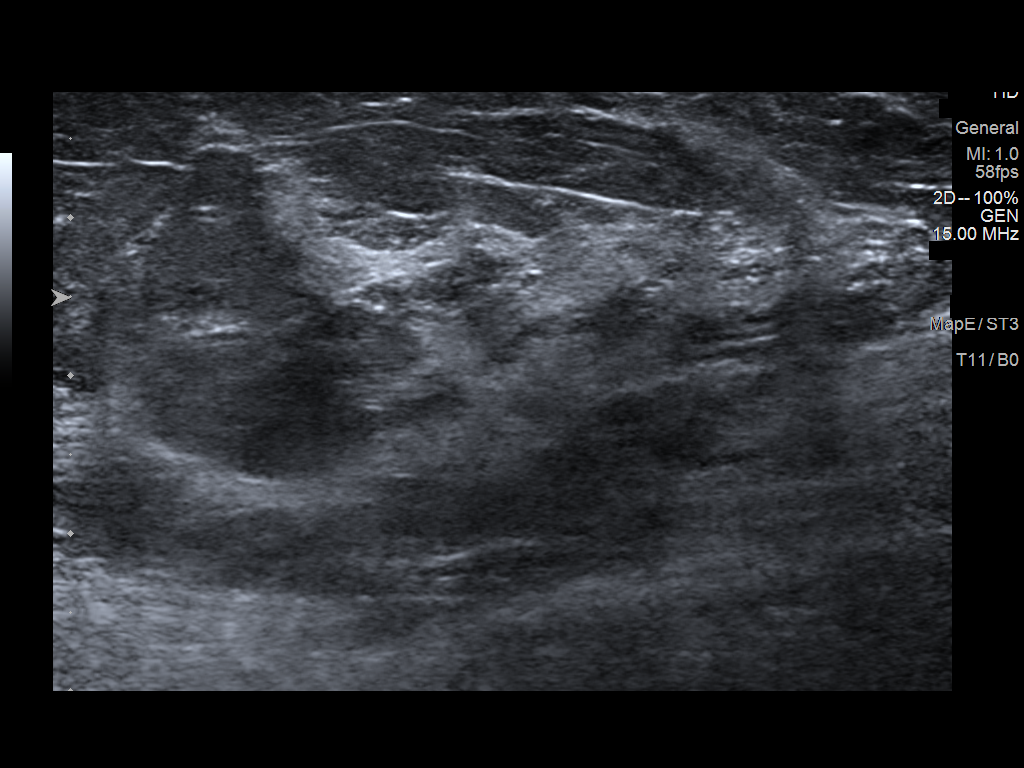
[im 2/2]
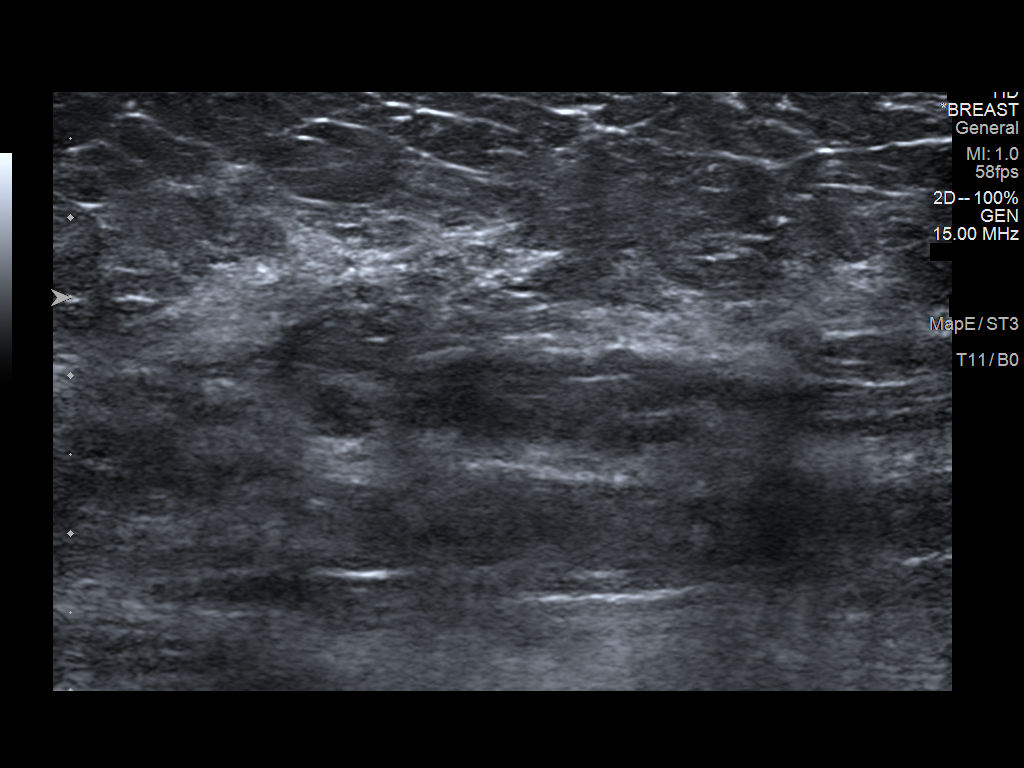

[2 of 2 positions shown; findings below may reference images not displayed]

ACR Breast Density Category c: The breast tissue is heterogeneously
dense, which may obscure small masses.
FINDINGS: There is an irregular mass within the inferior left breast, at
posterior depth, measuring approximately 2 cm greatest dimension.

There are no additional masses, suspicious calcifications or
secondary signs of malignancy within either breast. Specifically,
there are no mammographic abnormalities within the outer breasts
bilaterally corresponding to the additional areas of clinical
concern.

Mammographic images were processed with CAD.

LEFT breast: Targeted ultrasound is performed, showing an irregular
hypoechoic mass in the left breast at the 5:30 o'clock axis, 4 cm
from the nipple, measuring 2.5 x 1.6 x 2 cm, corresponding to the
mammographic finding and corresponding to 1 of the sites of clinical
concern.

Targeted ultrasound also performed of the outer left breast
corresponding to the additional area of clinical concern, showing
only normal fibroglandular tissues and fat lobules throughout.

RIGHT breast: Targeted ultrasound is performed, evaluating the outer
right breast corresponding to the area of clinical concern, showing
only normal fibroglandular tissues and fat lobules. No suspicious
solid or cystic mass is identified.

LEFT axilla was evaluated with ultrasound showing a single mildly
prominent lymph node, measuring 1.3 cm greatest dimension, with
normal cortical thickness of 3.5 mm but with questionable effacement
of the fatty hilum.
IMPRESSION: 1. Highly suspicious mass within the LEFT breast at the 5:30 o'clock
axis, 4 cm from the nipple, measuring 2.5 cm, corresponding to the
mammographic finding and corresponding to 1 of the palpable lumps in
the left breast. Ultrasound-guided biopsy is recommended.
2. Mildly prominent lymph node in the LEFT axilla, with normal
cortical thickness but with questionable effacement of the fatty
hilum. Ultrasound-guided core biopsy is recommended.
3. No evidence of malignancy within the RIGHT breast. Also, no
evidence of malignancy within the second area of clinical concern in
the upper outer left breast.

RECOMMENDATION:
1. Ultrasound-guided biopsy of the left breast mass at the 5:30
o'clock axis.
2. Ultrasound-guided biopsy of the single mildly prominent lymph
node in the left axilla.
Ultrasound-guided biopsies are scheduled for [REDACTED].

I have discussed the findings and recommendations with the patient.
Results were also provided in writing at the conclusion of the
visit. If applicable, a reminder letter will be sent to the patient
regarding the next appointment.

BI-RADS CATEGORY  5: Highly suggestive of malignancy.

## 2019-03-14 IMAGING — NM NM BONE WHOLE BODY
2 series · 2 of 2 positions shown · non-contrast
Comparison: CT chest, Abdomen and Pelvis today reported separately.

CLINICAL DATA: 50-year-old female with left breast cancer. Staging.

EXAM:
NUCLEAR MEDICINE WHOLE BODY BONE SCAN
TECHNIQUE: Whole body anterior and posterior images were obtained approximately
3 hours after intravenous injection of radiopharmaceutical.
RADIOPHARMACEUTICALS:  18.6 mCi 3echnetium-88m MDP IV

[Series 1: whole body · 2.66mm/px · 1 of 1 slices shown (1 of 2)]
[im 1/1]
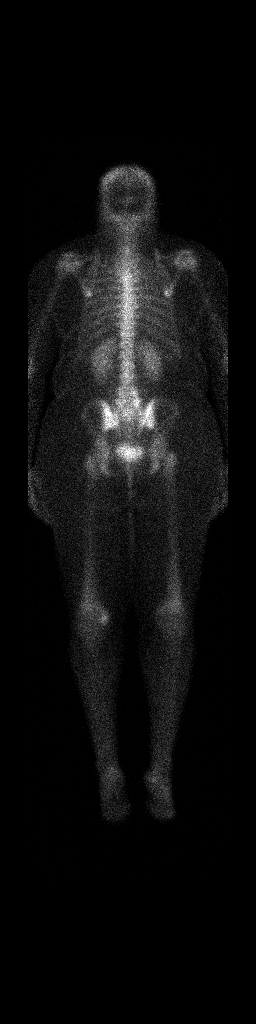

[Series 1: whole body · 2.66mm/px · 1 of 1 slices shown (2 of 2)]
[im 1/1]
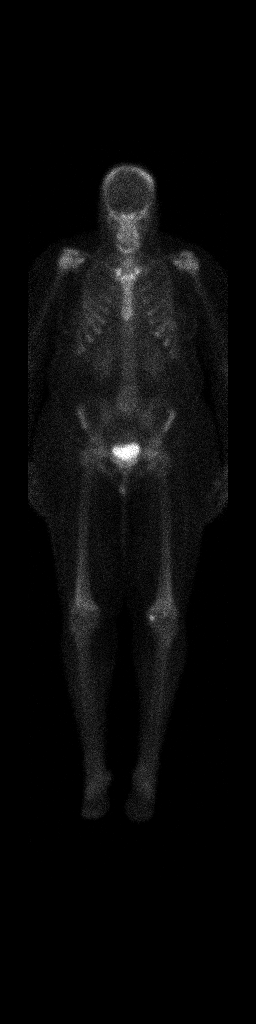

[2 of 2 positions shown; findings below may reference images not displayed]

FINDINGS: Expected radiotracer activity in both kidneys in the urinary
bladder. Minimal perineum contamination.

Homogeneous radiotracer activity throughout the axial skeleton
including the skull.

Homogeneous radiotracer activity throughout the visible upper
extremities.

Homogeneous radiotracer activity throughout the lower extremities
except for degenerative appearing increased activity along the
medial compartment of the left knee joint.
IMPRESSION: No findings specific for metastatic disease to bone.

## 2019-03-14 IMAGING — CT CT CHEST W/ CM
2 of 5 series · 13 of 36 positions shown, 16 images · IV contrast (iopamidol)
Comparison: Abdomen and pelvis CT 06/25/2016

CLINICAL DATA: Breast cancer

EXAM:
CT CHEST, ABDOMEN, AND PELVIS WITH CONTRAST
TECHNIQUE: Multidetector CT imaging of the chest, abdomen and pelvis was
performed following the standard protocol during bolus
administration of intravenous contrast.
CONTRAST:  100mL [id number] IOPAMIDOL ([id number]) INJECTION 61%

[Series 2: cap with · axial · 0.76mm/px · z∈[+1201,+1726]mm · 10 of 129 slices shown, 13 images]
[im 12/129  mediastinal]
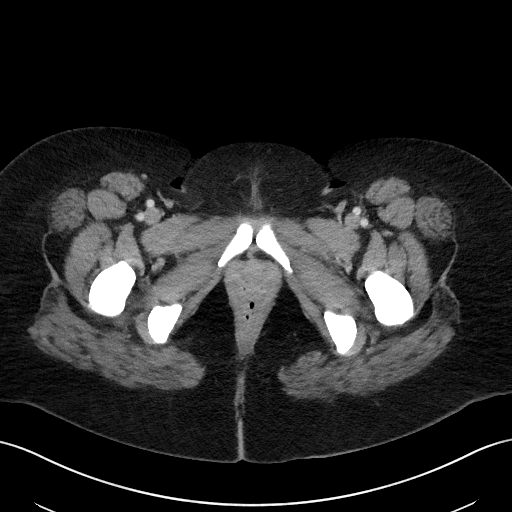
[im 12/129  lung]
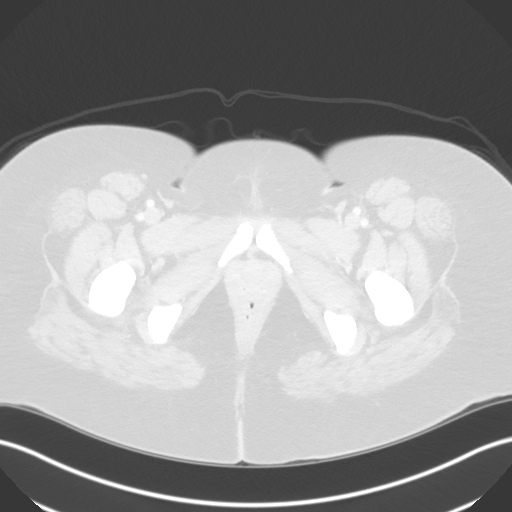
[im 24/129  lung]
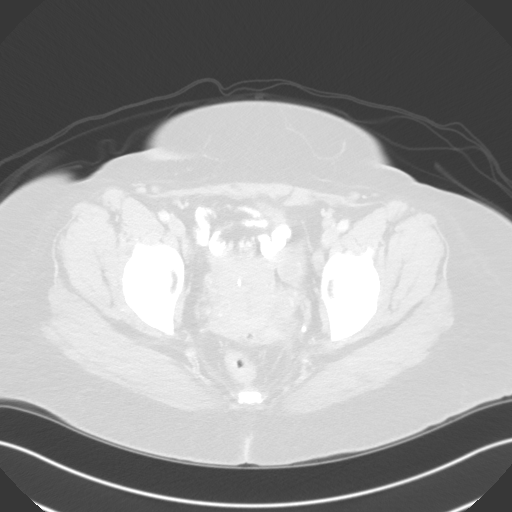
[im 35/129  lung]
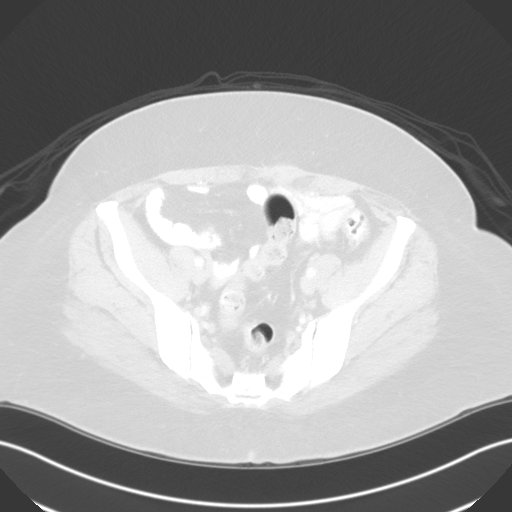
[im 47/129  lung]
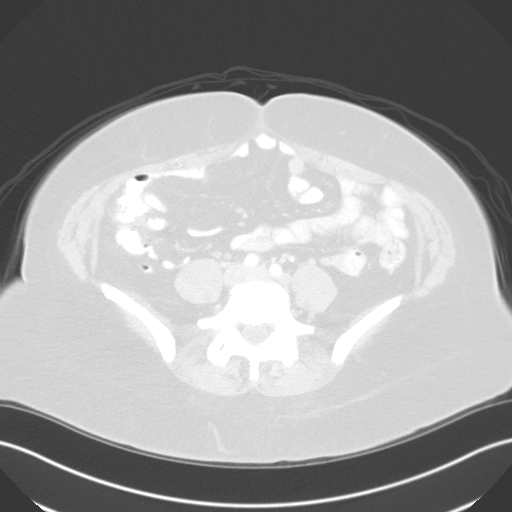
[im 59/129  mediastinal]
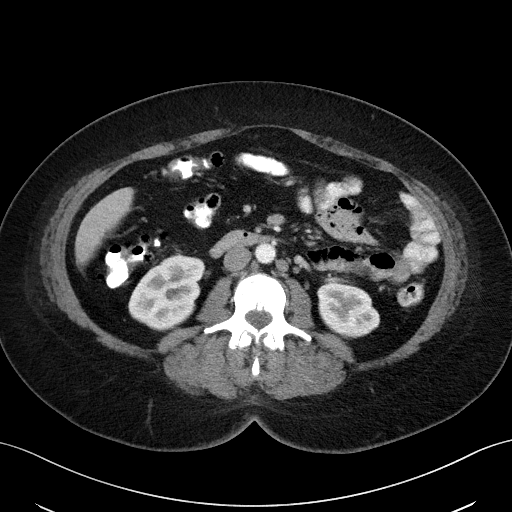
[im 59/129  lung]
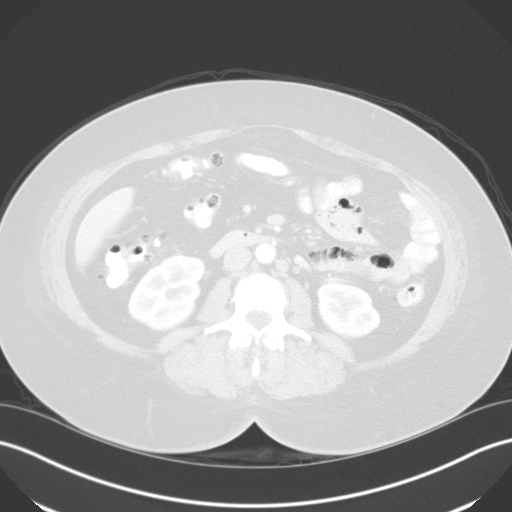
[im 70/129  lung]
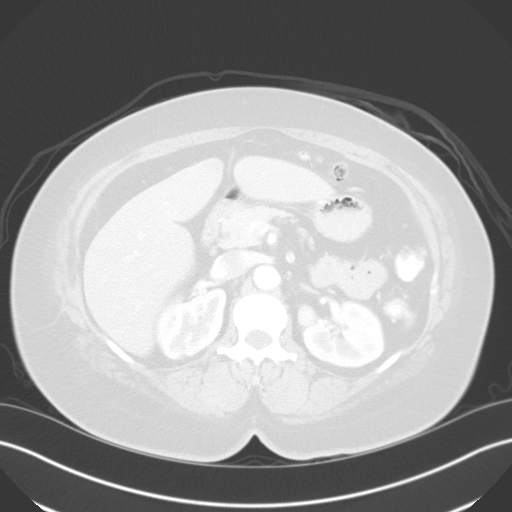
[im 82/129  lung]
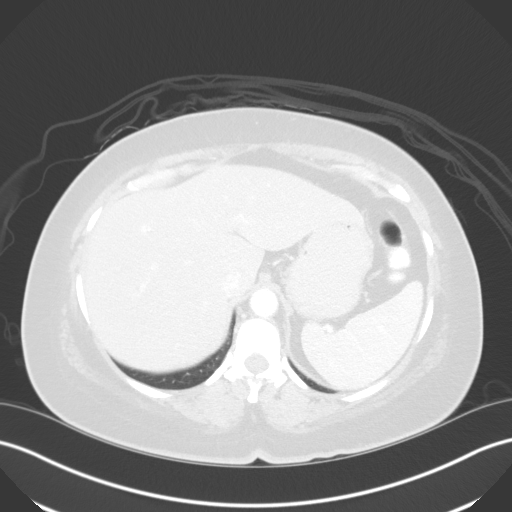
[im 94/129  lung]
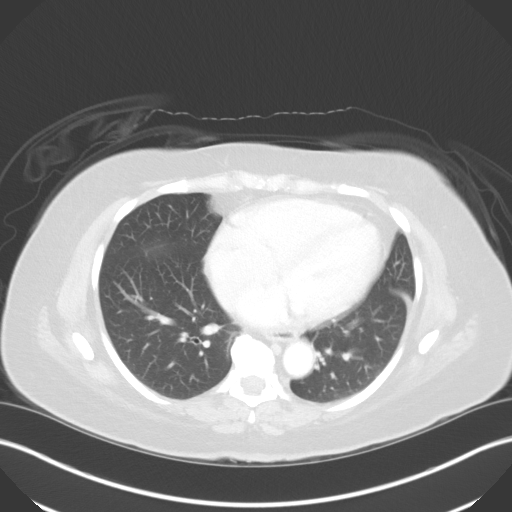
[im 105/129  mediastinal]
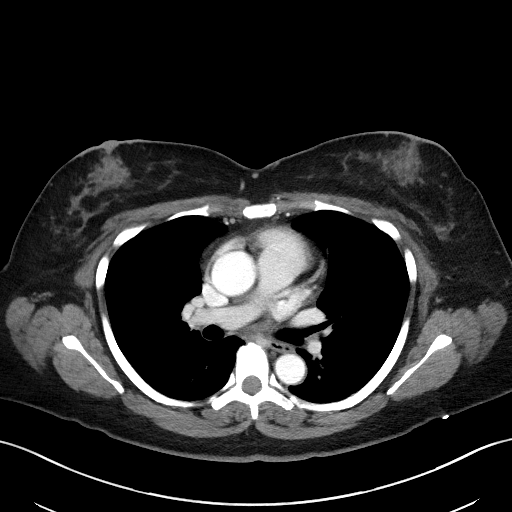
[im 105/129  lung]
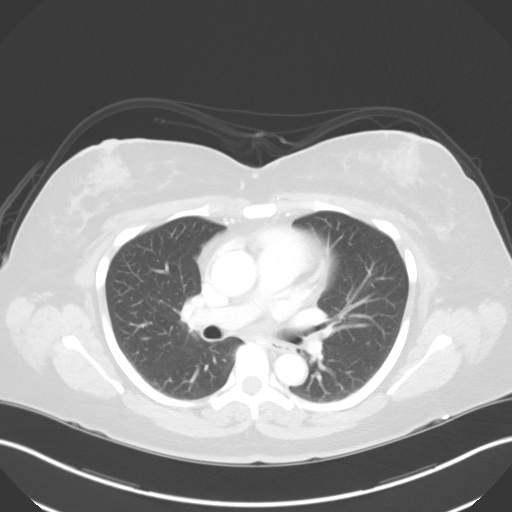
[im 117/129  lung]
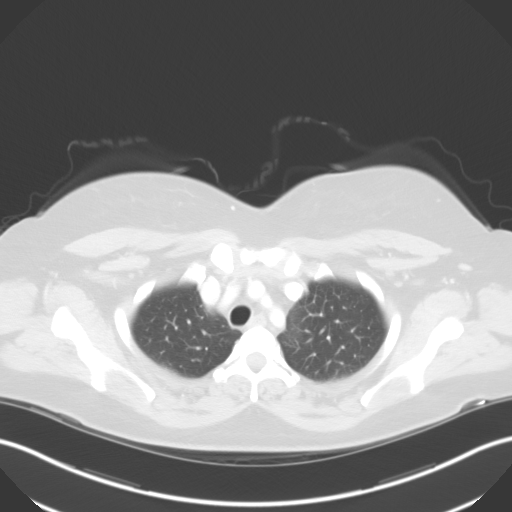

[Series 4: coronals · coronal · 0.91mm/px · 3 of 122 slices shown]
[im 25/122  lung]
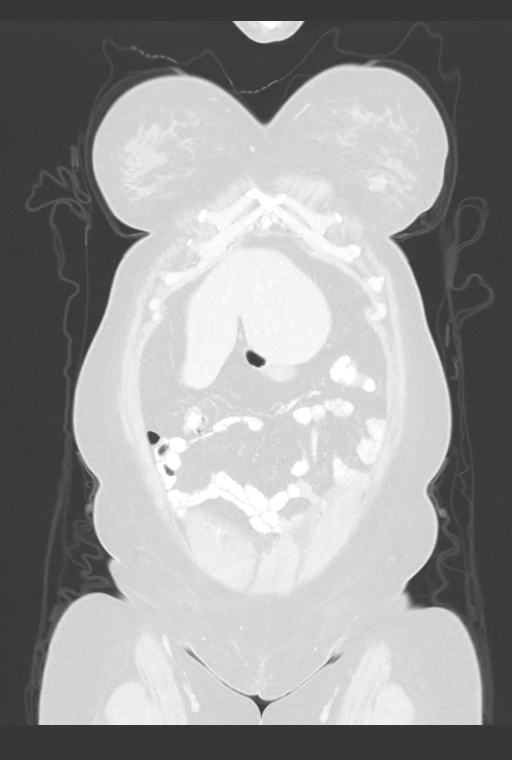
[im 49/122  lung]
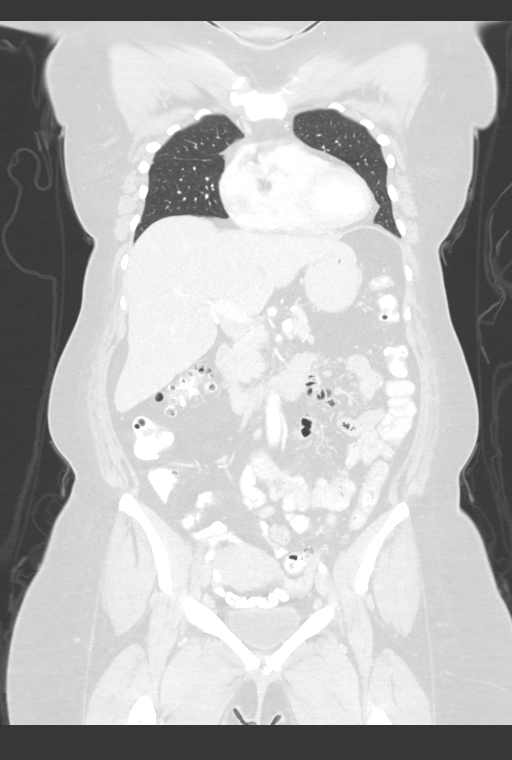
[im 73/122  lung]
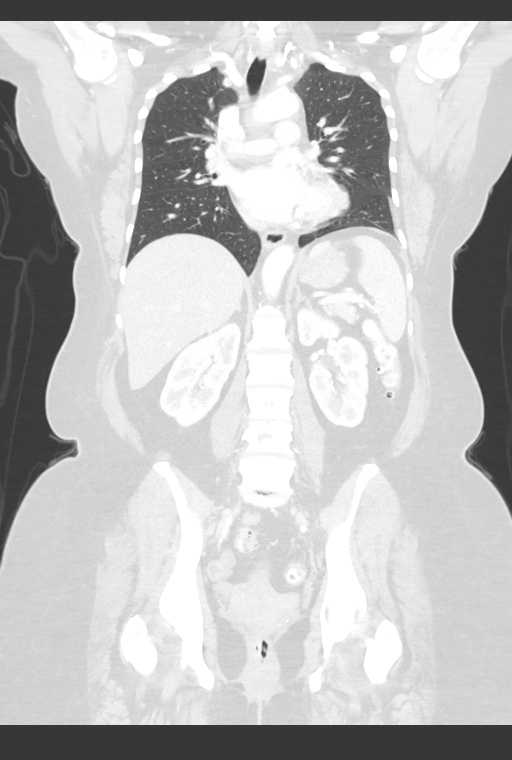

[13 of 36 positions shown; findings below may reference images not displayed]

FINDINGS: CT CHEST FINDINGS

Cardiovascular: Heart is enlarged. No pericardial effusion. No
thoracic aortic aneurysm.

Mediastinum/Nodes: No mediastinal lymphadenopathy. There is no hilar
lymphadenopathy. 12 mm short axis left axillary lymph node is
identified on image 19 series 2. Another 10 mm short axis left
axillary lymph node is seen on image 18. While the these are only
minimally increased in size, there are asymmetric when comparing to
the right.

Lungs/Pleura: Tiny 1-2 mm perifissural nodule right lung identified
image 56 series 6. Lungs otherwise clear.

Musculoskeletal: Bone windows reveal no worrisome lytic or sclerotic
osseous lesions. 18 mm soft tissue lesion identified inferior left
breast with apparent localization clip, compatible with primary
lesion.

CT ABDOMEN PELVIS FINDINGS

Hepatobiliary: No focal abnormality within the liver parenchyma.
Gallbladder surgically absent. No intrahepatic or extrahepatic
biliary dilation.

Pancreas: No focal mass lesion. No dilatation of the main duct. No
intraparenchymal cyst. No peripancreatic edema.

Spleen: No splenomegaly. 7 mm hypoattenuating lesion identified in
the subcapsular spleen (image 50 series 2), not seen on prior study.

Adrenals/Urinary Tract: No adrenal nodule or mass. Kidneys
unremarkable. No evidence for hydroureter. The urinary bladder
appears normal for the degree of distention.

Stomach/Bowel: Tiny hiatal hernia. Stomach otherwise unremarkable.
Duodenum is normally positioned as is the ligament of Treitz. No
small bowel wall thickening. No small bowel dilatation. The terminal
ileum is normal. The appendix is normal. Diverticuli are seen
scattered along the entire length of the colon without CT findings
of diverticulitis.

Vascular/Lymphatic: There is abdominal aortic atherosclerosis
without aneurysm. Portal vein and superior mesenteric vein are
patent. Duplication of IVC evident. There is no gastrohepatic or
hepatoduodenal ligament lymphadenopathy. No intraperitoneal or
retroperitoneal lymphadenopathy. Small lymph nodes are seen along
each pelvic sidewall, similar to prior, without lymphadenopathy.

Reproductive: Fibroid changes noted in the uterus. There is no
adnexal mass.

Other: No intraperitoneal free fluid.

Musculoskeletal: Bone windows reveal no worrisome lytic or sclerotic
osseous lesions. Bone windows reveal no worrisome lytic or sclerotic
osseous lesions.
IMPRESSION: 1. 18 mm soft tissue lesion inferior left breast compatible with
known primary malignancy.
2. Mildly enlarged left axillary lymph nodes in this patient with
biopsy-proven metastatic disease to the left axilla.
3. No evidence for lymphadenopathy elsewhere in the chest. No
lymphadenopathy in the abdomen or pelvis. No evidence for metastatic
disease in the abdomen or pelvis.
4. Tiny right perifissural lung nodule most likely subpleural lymph
node. Attention on follow-up recommended.
5. 7 mm hypoattenuating lesion in the spleen, likely a cyst or
pseudocyst. Attention on follow-up recommended.
6. Uterine fibroids.
7.  Aortic Atherosclerois (BKZRY-170.0)

## 2019-04-04 ENCOUNTER — Telehealth: Payer: Self-pay

## 2019-04-04 NOTE — Telephone Encounter (Signed)
Patient calls stating she has been working from home since [place] 19, however her employer is wanting them to come into the office in one week for training.  She wants Dr. Ernestina Penna advice on whether she should do this or try to get her training by other means.  [phone]

## 2019-04-04 NOTE — Telephone Encounter (Signed)
Her last chemo was 1.5 years ago, so it's OK to return to office if it's required, assume she and her colleagues take proper precautions.  But if her employer offers other options, then she can certainly take non-in-person training.   [name] MD

## 2019-04-18 NOTE — Progress Notes (Signed)
[facility]   [phone] [phone]   Clinic Follow up Note   Patient Care Team: [name], MD as PCP - General (Family Medicine) [name], MD as Consulting Physician (Hematology) [name], MD as Consulting Physician (General Surgery) [name], MD as Consulting Physician (Radiation Oncology) Gardenia Phlegm, NP as Nurse Practitioner (Hematology and Oncology)  Date of Service:  04/20/2019  CHIEF COMPLAINT: Follow up left breastcancer, triple negative  SUMMARY OF ONCOLOGIC HISTORY: Oncology History Overview Note  Cancer Staging Malignant neoplasm of lower-outer quadrant of left breast of female, estrogen receptor negative ([place]) Staging form: Breast, AJCC 8th Edition - Clinical: Stage IIB (cT2, cN0(f), cM0, G3, ER: Negative, PR: Negative, HER2: Negative) - Unsigned     Malignant neoplasm of lower-outer quadrant of left breast of female, estrogen receptor negative ([place])  04/27/2017 Mammogram   IMPRESSION: 1. Highly suspicious mass within the LEFT breast at the 5:30 o'clock axis, 4 cm from the nipple, measuring 2.5 cm, corresponding to the mammographic finding and corresponding to 1 of the palpable lumps in the left breast. Ultrasound-guided biopsy is recommended. 2. Mildly prominent lymph node in the LEFT axilla, with normal cortical thickness but with questionable effacement of the fatty hilum. Ultrasound-guided core biopsy is recommended. 3. No evidence of malignancy within the RIGHT breast. Also, no evidence of malignancy within the second area of clinical concern in the upper outer left breast.   04/29/2017 Initial Diagnosis   Malignant neoplasm of lower-outer quadrant of left breast of female, estrogen receptor negative ([place])   04/29/2017 Receptors her2   Estrogen Receptor: 0%, NEGATIVE Progesterone Receptor: 0%, NEGATIVE Proliferation Marker Ki67: 90% HER2 NEGATIVE    04/29/2017 Initial Biopsy    Diagnosis 1.  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 - SEE COMMENT. 2. Lymph node, needle/core biopsy, left axilla - THERE IN NO EVIDENCE OF CARCINOMA IN 1 OF 1 LYMPH NODE (0/1).   05/14/2017 Imaging   MRI of Breast Bilateral 05/14/17  IMPRESSION: 1. 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 2. Non mass enhancement extending approximately 3.2 cm anterior to the mass in the lower inner quadrant of the left breast, suspicious for DCIS. There is no correlate on recent mammography. The overall extent of the mass and non mass enhancement is approximately 5 cm. 3. No MRI evidence of malignancy involving the right breast. 4. No pathologic lymphadenopathy. Upper normal sized left axillary lymph nodes, the largest of which was previously biopsied no evidence of metastatic disease.   05/15/2017 Imaging   Bone scan whole body 05/15/17 IMPRESSION: No findings specific for metastatic disease to bone.   05/15/2017 Imaging   CT CAP W contrast 05/15/17  IMPRESSION: 1. 18 mm soft tissue lesion inferior left breast compatible with known primary malignancy. 2. Mildly enlarged left axillary lymph nodes in this patient with biopsy-proven metastatic disease to the left axilla. 3. No evidence for lymphadenopathy elsewhere in the chest. No lymphadenopathy in the abdomen or pelvis. No evidence for metastatic disease in the abdomen or pelvis. 4. Tiny right perifissural lung nodule most likely subpleural lymph node. Attention on follow-up recommended. 5. 7 mm hypoattenuating lesion in the spleen, likely a cyst or pseudocyst. Attention on follow-up recommended. 6. Uterine fibroids. 7.  Aortic Atherosclerois (ICD10-170.0)   05/19/2017 Procedure   Port placement by Dr. [name] on 9/11/8  05/22/2017 - 10/09/2017 Chemotherapy    neoadjuvant  chemotherpy AC every 2 weeks for 4 cycles starting 05/22/17 and completed on 07/07/17, followed by weekly paclitaxel for 12 cycles starting 07/24/17 to complete on 10/09/17    05/22/2017 Pathology Results   Diagnosis Breast, left, needle core biopsy, lower inner quad - DUCTAL CARCINOMA IN SITU. 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  The genes analyzed were the 46 genes on [facility]'s Common Cancers panel (APC, ATM, AXIN2, BARD1, BMPR1A, BRCA1, BRCA2, BRIP1, CDH1, CDKN2A, CHEK2, CTNNA1, DICER1, EPCAM, GREM1, HOXB13, KIT, MEN1, MLH1, MSH2, MSH3, MSH6, MUTYH, NBN, NF1, NTHL1, PALB2, PDGFRA, PMS2, POLD1, POLE, PTEN, RAD50, RAD51C, RAD51D, SDHA, SDHB, SDHC, SDHD, SMAD4, SMARCA4, STK11, TP53, TSC1, TSC2, VHL).     08/14/2017 Echocardiogram   EF 60-65%  Study Conclusions  - Left ventricle: The cavity size was normal. Systolic function was   normal. The estimated ejection fraction was in the range of 60%   to 65%. Wall motion was normal; there were no regional wall   motion abnormalities. Doppler parameters are consistent with   abnormal left ventricular relaxation (grade 1 diastolic   dysfunction). - Pulmonary arteries: PA peak pressure: 31 mm Hg (S).  Impressions:  - No change from September 2018 study (strain rate not performed on   that study).      10/14/2017 Imaging   MRI Breast Bilateral  IMPRESSION:  Significant improvement following neoadjuvant treatment.  No residual enhancement in the left breast at sites of known malignancy.  RECOMMENDATION: Treatment plan for known malignancy.   11/19/2017 Surgery   BILATERAL MASTECTOMIES WITH LEFT SENTINEL LYMPH NODE BIOPSY by Dr. [name] 11/19/17   11/19/2017 Pathology Results    Diagnosis 11/19/17 1. Breast, simple mastectomy, Right - FIBROCYSTIC AND FIBROADENOMATOID CHANGE. - NO MALIGNANCY IDENTIFIED. 2.  Breast, simple mastectomy, Left - NO RESIDUAL CARCINOMA IDENTIFIED. - BIOPSY SITES. - FIBROCYSTIC AND FIBROADENOMATOID CHANGE. - SEE ONCOLOGY TABLE. 3. Lymph node, sentinel, biopsy, Left Axillary #1 - ONE OF ONE LYMPH NODES NEGATIVE FOR CARCINOMA (0/1). - BIOPSY SITE. 4. Lymph node, sentinel, biopsy, Left Axillary # 2 - ONE OF ONE LYMPH NODES NEGATIVE FOR CARCINOMA (0/1).   12/29/2018 Imaging   CT CAP W Contrast  IMPRESSION: No evidence of recurrent or metastatic carcinoma. No other acute findings.   3.5 cm right uterine fibroid.   Colonic diverticulosis, without radiographic evidence of diverticulitis.        CURRENT THERAPY:  Surveillance  INTERVAL HISTORY:  Briana Price is here for a follow up of left breast cancer. She presents to the clinic alone. She notes she is doing well. She still has her PAC placed and has not set date for reconstruction surgery but plans to do surgery before the end of the year. She notes she was previously trying to lose weight, but has gained weight back.  She notes no new changes. She notes she tries to check her own breast but her breast is still numb. She notes she had skin rash under her right axilla.     REVIEW OF SYSTEMS:   Constitutional: Denies fevers, chills (+) weight gain  Eyes: Denies blurriness of vision Ears, nose, mouth, throat, and face: Denies mucositis or sore throat Respiratory: Denies cough, dyspnea or wheezes Cardiovascular: Denies palpitation, chest discomfort or lower extremity swelling Gastrointestinal:  Denies nausea, heartburn or change in bowel habits Skin: (+) Mild skin  rash of right axilla Lymphatics: Denies new lymphadenopathy or easy bruising Neurological:Denies numbness, tingling or new weaknesses Behavioral/Psych: Mood is stable, no new changes  All other systems were reviewed with the patient and are negative.  MEDICAL HISTORY:  Past Medical History:  Diagnosis Date  . Anemia   . Breast cancer  ([place])   . Cancer [facility])    left breast cancer  . Family history of breast cancer   . Genetic testing 05/28/2017   Common Cancers panel (46 genes) @ [facility] - No pathogenic mutations detected  . GERD (gastroesophageal reflux disease)   . Headache    Migraines  . Hypertension     SURGICAL HISTORY: Past Surgical History:  Procedure Laterality Date  . BREAST SURGERY     biopsy  . CESAREAN SECTION    . CHOLECYSTECTOMY N/A 02/07/2013   Procedure: LAPAROSCOPIC CHOLECYSTECTOMY WITH INTRAOPERATIVE CHOLANGIOGRAM;  Surgeon: Gwenyth Ober, MD;  Location: [facility];  Service: General;  Laterality: N/A;  . COLONOSCOPY W/ BIOPSIES AND POLYPECTOMY    . FOOT SURGERY  2003   lt foot bunionectomy  . MASTECTOMY W/ SENTINEL NODE BIOPSY Bilateral 11/19/2017   Procedure: BILATERAL MASTECTOMIES WITH LEFT SENTINEL LYMPH NODE BIOPSY;  Surgeon: [name], MD;  Location: [facility];  Service: General;  Laterality: Bilateral;  . MULTIPLE TOOTH EXTRACTIONS    . PORTACATH PLACEMENT Right 05/19/2017   Procedure: INSERTION PORT-A-CATH;  Surgeon: [name], MD;  Location: [facility];  Service: General;  Laterality: Right;    I have reviewed the social history and family history with the patient and they are unchanged from previous note.  ALLERGIES:  has No Known Allergies.  MEDICATIONS:  Current Outpatient Medications  Medication Sig Dispense Refill  . aspirin-acetaminophen-caffeine (EXCEDRIN MIGRAINE) 250-250-65 MG tablet Take 2 tablets by mouth 2 (two) times daily as needed for headache.    Marland Kitchen atorvastatin (LIPITOR) 20 MG tablet Take 20 mg by mouth daily.  1  . Cholecalciferol (VITAMIN D) 2000 units CAPS Take 2,000 Units by mouth daily.    . ferrous sulfate 325 (65 FE) MG tablet Take 325 mg by mouth every other day.     . hydrochlorothiazide (HYDRODIURIL) 25 MG tablet Take 25 mg by mouth daily.    Marland Kitchen lisinopril (PRINIVIL,ZESTRIL) 10 MG tablet Take 10 mgs by mouth daily  2  .  Multiple Vitamins-Minerals (MULTIVITAMIN ADULT PO) Take 1 tablet by mouth daily.    Marland Kitchen doxycycline (VIBRA-TABS) 100 MG tablet Take 1 tablet (100 mg total) by mouth 2 (two) times daily. 28 tablet 0   No current facility-administered medications for this visit.    Facility-Administered Medications Ordered in Other Visits  Medication Dose Route Frequency Provider Last Rate Last Dose  . heparin lock flush 100 unit/mL  500 Units Intravenous Once PRN [name], MD      . sodium chloride flush (NS) 0.9 % injection 10 mL  10 mL Intravenous PRN [name], MD   10 mL at 08/07/17 1354  . sodium chloride flush (NS) 0.9 % injection 10 mL  10 mL Intravenous PRN [name], MD        PHYSICAL EXAMINATION: ECOG PERFORMANCE STATUS: 0 - Asymptomatic  Vitals:   04/20/19 1533  BP: 137/86  Pulse: 91  Resp: 18  Temp: (!) 97.3 F (36.3 C)  SpO2: 95%   Filed Weights   04/20/19 1533  Weight: 227 lb 9.6 oz (103.2 kg)    GENERAL:alert, no distress and comfortable SKIN:  skin color, texture, turgor are normal (+) multiple acne and boil looking skin rashes at right axilla   EYES: normal, Conjunctiva are pink and non-injected, sclera clear  NECK: supple, thyroid normal size, non-tender, without nodularity LYMPH:  no palpable lymphadenopathy in the cervical, axillary  LUNGS: clear to auscultation and percussion with normal breathing effort HEART: regular rate & rhythm and no murmurs and no lower extremity edema ABDOMEN:abdomen soft, non-tender and normal bowel sounds Musculoskeletal:no cyanosis of digits and no clubbing  NEURO: alert & oriented x 3 with fluent speech, no focal motor/sensory deficits BREAST: S/p b/l mastectomy: Surgical incisions healed well. No palpable mass, nodules or adenopathy bilaterally. Breast exam benign.   LABORATORY DATA:  I have reviewed the data as listed CBC Latest Ref Rng & Units 04/20/2019 01/17/2019 09/15/2018  WBC 4.0 - 10.5 K/uL 7.7 8.5 7.3  Hemoglobin 12.0 - 15.0 g/dL 13.2  14.0 12.6  Hematocrit 36.0 - 46.0 % 39.6 42.0 37.9  Platelets 150 - 400 K/uL 370 380 320     CMP Latest Ref Rng & Units 04/20/2019 01/17/2019 09/15/2018  Glucose 70 - 99 mg/dL 107(H) 123(H) 100(H)  BUN 6 - 20 mg/dL '7 8 8  '$ Creatinine 0.44 - 1.00 mg/dL 0.80 0.86 0.75  Sodium 135 - 145 mmol/L 139 138 140  Potassium 3.5 - 5.1 mmol/L 3.7 3.7 3.8  Chloride 98 - 111 mmol/L 100 98 100  CO2 22 - 32 mmol/L 29 29 32  Calcium 8.9 - 10.3 mg/dL 10.1 10.1 9.9  Total Protein 6.5 - 8.1 g/dL 7.9 8.4(H) 8.2(H)  Total Bilirubin 0.3 - 1.2 mg/dL 1.1 0.7 1.1  Alkaline Phos 38 - 126 U/L 99 98 94  AST 15 - 41 U/L '30 31 27  '$ ALT 0 - 44 U/L 33 39 29      RADIOGRAPHIC STUDIES: I have personally reviewed the radiological images as listed and agreed with the findings in the report. No results found.   ASSESSMENT & PLAN:  Briana Price is a 53 y.o. female with    1. 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 She is s/p neoadjuvant AC-T and b/l mastectomy.She has recovered well from treatment. -She post-poned breast reconstruction at [facility] and plans to proceed by end of 2020.  [facility] is still in [facility]. Will remove withbreastreconstruction. Will continue port flush every 6-8 weeks -From a breast cancer standpoint she is clinically doing well. Lab reviewed, her CBCand CMP WNL except BG 107. Her physical examisunremarkableoverall except right axilla skin infection. There is no clinical concern for recurrence. Continue breast cancer surveillance.  -She is s/p b/l mastectomy, no need for repeat mammograms. -We discussed retuning to reduced sweets intake and lower carbohydrate intake to help her lose weight. She agrees to try  -F/u in 6 months  2. Right axillary furuncles  -I recommend warm compression, and keep the area clean -I will call in doxycycline  100 mg twice daily for 14 days  3. 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  4. HTN -Continuefollow-up with her primary care physician  -she is on HCTZ and lisinopril. Willcontinue -BPat 137/86(04/20/19)   5. Lung nodules, Subcapsular spleen cyst -Her restaging CT scan showeda 34m subcapsular cyst andtiny right lung nodule, 1-2 mm, likely benign, follow-up recommended. She never smoked, low risk for lung  cancer. -12/28/17 CT CAP showed stable cyst and nodule, no further surveillance scan is needed.  6. Cancer screenings  -She had a colonoscopy with Dr. Collene Mares in the past 2 years, benign  -She is up to date on her Pap Smears, will continue  -Her last period was 05/2017 during chemo. No menopausal symptoms present. Still likely pre-menopausal. Will wait on bone density scan.    PLAN: I called in Doxycycline today  Port flush in 2 and 4 months  Proceed with breast reconstruction at [facility] later this year with port removal   Lab and f/u in 6 months    No problem-specific Assessment & Plan notes found for this encounter.   No orders of the defined types were placed in this encounter.  All questions were answered. The patient knows to call the clinic with any problems, questions or concerns. No barriers to learning was detected. I spent 15 minutes counseling the patient face to face. The total time spent in the appointment was 20 minutes and more than 50% was on counseling and review of test results     [name], MD 04/20/2019   I, Joslyn Devon, am acting as scribe for [name], MD.   I have reviewed the above documentation for accuracy and completeness, and I agree with the above.

## 2019-04-20 ENCOUNTER — Other Ambulatory Visit: Payer: Self-pay

## 2019-04-20 ENCOUNTER — Inpatient Hospital Stay: Payer: Federal, State, Local not specified - PPO

## 2019-04-20 ENCOUNTER — Inpatient Hospital Stay: Payer: Federal, State, Local not specified - PPO | Admitting: Hematology

## 2019-04-20 ENCOUNTER — Inpatient Hospital Stay (HOSPITAL_BASED_OUTPATIENT_CLINIC_OR_DEPARTMENT_OTHER): Payer: Federal, State, Local not specified - PPO | Admitting: Hematology

## 2019-04-20 ENCOUNTER — Encounter: Payer: Self-pay | Admitting: Hematology

## 2019-04-20 ENCOUNTER — Inpatient Hospital Stay: Payer: Federal, State, Local not specified - PPO | Attending: Hematology

## 2019-04-20 VITALS — BP 137/86 | HR 91 | Temp 97.3°F | Resp 18 | Ht 66.0 in | Wt 227.6 lb

## 2019-04-20 DIAGNOSIS — C50512 Malignant neoplasm of lower-outer quadrant of left female breast: Secondary | ICD-10-CM

## 2019-04-20 DIAGNOSIS — Z79899 Other long term (current) drug therapy: Secondary | ICD-10-CM | POA: Insufficient documentation

## 2019-04-20 DIAGNOSIS — Z7982 Long term (current) use of aspirin: Secondary | ICD-10-CM | POA: Diagnosis not present

## 2019-04-20 DIAGNOSIS — Z9013 Acquired absence of bilateral breasts and nipples: Secondary | ICD-10-CM | POA: Diagnosis not present

## 2019-04-20 DIAGNOSIS — Z452 Encounter for adjustment and management of vascular access device: Secondary | ICD-10-CM | POA: Insufficient documentation

## 2019-04-20 DIAGNOSIS — Z171 Estrogen receptor negative status [ER-]: Secondary | ICD-10-CM | POA: Diagnosis not present

## 2019-04-20 DIAGNOSIS — Z853 Personal history of malignant neoplasm of breast: Secondary | ICD-10-CM | POA: Insufficient documentation

## 2019-04-20 DIAGNOSIS — I10 Essential (primary) hypertension: Secondary | ICD-10-CM | POA: Insufficient documentation

## 2019-04-20 DIAGNOSIS — Z95828 Presence of other vascular implants and grafts: Secondary | ICD-10-CM

## 2019-04-20 LAB — CBC WITH DIFFERENTIAL/PLATELET
Abs Immature Granulocytes: 0.01 10*3/uL (ref 0.00–0.07)
Basophils Absolute: 0.1 10*3/uL (ref 0.0–0.1)
Basophils Relative: 1 %
Eosinophils Absolute: 0.1 10*3/uL (ref 0.0–0.5)
Eosinophils Relative: 1 %
HCT: 39.6 % (ref 36.0–46.0)
Hemoglobin: 13.2 g/dL (ref 12.0–15.0)
Immature Granulocytes: 0 %
Lymphocytes Relative: 28 %
Lymphs Abs: 2.1 10*3/uL (ref 0.7–4.0)
MCH: 28.3 pg (ref 26.0–34.0)
MCHC: 33.3 g/dL (ref 30.0–36.0)
MCV: 84.8 fL (ref 80.0–100.0)
Monocytes Absolute: 0.5 10*3/uL (ref 0.1–1.0)
Monocytes Relative: 7 %
Neutro Abs: 4.9 10*3/uL (ref 1.7–7.7)
Neutrophils Relative %: 63 %
Platelets: 370 10*3/uL (ref 150–400)
RBC: 4.67 MIL/uL (ref 3.87–5.11)
RDW: 12.6 % (ref 11.5–15.5)
WBC: 7.7 10*3/uL (ref 4.0–10.5)
nRBC: 0 % (ref 0.0–0.2)

## 2019-04-20 LAB — COMPREHENSIVE METABOLIC PANEL
ALT: 33 U/L (ref 0–44)
AST: 30 U/L (ref 15–41)
Albumin: 3.9 g/dL (ref 3.5–5.0)
Alkaline Phosphatase: 99 U/L (ref 38–126)
Anion gap: 10 (ref 5–15)
BUN: 7 mg/dL (ref 6–20)
CO2: 29 mmol/L (ref 22–32)
Calcium: 10.1 mg/dL (ref 8.9–10.3)
Chloride: 100 mmol/L (ref 98–111)
Creatinine, Ser: 0.8 mg/dL (ref 0.44–1.00)
GFR calc Af Amer: >60 mL/min (ref >60)
GFR calc non Af Amer: >60 mL/min (ref >60)
Glucose, Bld: 107 mg/dL — ABNORMAL HIGH (ref 70–99)
Potassium: 3.7 mmol/L (ref 3.5–5.1)
Sodium: 139 mmol/L (ref 135–145)
Total Bilirubin: 1.1 mg/dL (ref 0.3–1.2)
Total Protein: 7.9 g/dL (ref 6.5–8.1)

## 2019-04-20 MED ORDER — DOXYCYCLINE HYCLATE 100 MG PO TABS: 100.0000 mg | ORAL_TABLET | Freq: Two times a day (BID) | ORAL | 0 refills | Status: DC

## 2019-04-20 MED ORDER — SODIUM CHLORIDE 0.9% FLUSH
10.0000 mL | INTRAVENOUS | Status: DC | PRN
  Administered 2019-04-20: 10 mL via INTRAVENOUS
  Filled 2019-04-20: qty 10

## 2019-04-20 MED ORDER — HEPARIN SOD (PORK) LOCK FLUSH 100 UNIT/ML IV SOLN
500.0000 [IU] | Freq: Once | INTRAVENOUS | Status: AC | PRN
  Administered 2019-04-20: 500 [IU] via INTRAVENOUS
  Filled 2019-04-20: qty 5

## 2019-04-21 ENCOUNTER — Telehealth: Payer: Self-pay | Admitting: Hematology

## 2019-04-21 ENCOUNTER — Ambulatory Visit: Payer: Federal, State, Local not specified - PPO | Admitting: Hematology

## 2019-04-21 ENCOUNTER — Other Ambulatory Visit: Payer: Federal, State, Local not specified - PPO

## 2019-04-21 DIAGNOSIS — L218 Other seborrheic dermatitis: Secondary | ICD-10-CM | POA: Diagnosis not present

## 2019-04-21 DIAGNOSIS — L662 Folliculitis decalvans: Secondary | ICD-10-CM | POA: Diagnosis not present

## 2019-04-21 DIAGNOSIS — L732 Hidradenitis suppurativa: Secondary | ICD-10-CM | POA: Diagnosis not present

## 2019-04-21 DIAGNOSIS — L700 Acne vulgaris: Secondary | ICD-10-CM | POA: Diagnosis not present

## 2019-04-21 DIAGNOSIS — L738 Other specified follicular disorders: Secondary | ICD-10-CM | POA: Diagnosis not present

## 2019-04-21 NOTE — Telephone Encounter (Signed)
Scheduled appt per 8/12 los.  Spoke with patient and she is aware of her appt date and time.  Left a voice message of the appt date and time also per the patient request since she was driving.

## 2019-04-27 DIAGNOSIS — H02203 Unspecified lagophthalmos right eye, unspecified eyelid: Secondary | ICD-10-CM | POA: Diagnosis not present

## 2019-04-27 DIAGNOSIS — H16141 Punctate keratitis, right eye: Secondary | ICD-10-CM | POA: Diagnosis not present

## 2019-04-27 DIAGNOSIS — H538 Other visual disturbances: Secondary | ICD-10-CM | POA: Diagnosis not present

## 2019-04-27 DIAGNOSIS — H00029 Hordeolum internum unspecified eye, unspecified eyelid: Secondary | ICD-10-CM | POA: Diagnosis not present

## 2019-05-02 DIAGNOSIS — C50512 Malignant neoplasm of lower-outer quadrant of left female breast: Secondary | ICD-10-CM | POA: Diagnosis not present

## 2019-05-02 DIAGNOSIS — S21009A Unspecified open wound of unspecified breast, initial encounter: Secondary | ICD-10-CM | POA: Diagnosis not present

## 2019-05-11 DIAGNOSIS — H538 Other visual disturbances: Secondary | ICD-10-CM | POA: Diagnosis not present

## 2019-05-11 DIAGNOSIS — H16141 Punctate keratitis, right eye: Secondary | ICD-10-CM | POA: Diagnosis not present

## 2019-05-11 DIAGNOSIS — H00029 Hordeolum internum unspecified eye, unspecified eyelid: Secondary | ICD-10-CM | POA: Diagnosis not present

## 2019-05-13 ENCOUNTER — Other Ambulatory Visit: Payer: Self-pay | Admitting: General Surgery

## 2019-06-01 ENCOUNTER — Encounter (HOSPITAL_COMMUNITY)
Admission: RE | Admit: 2019-06-01 | Discharge: 2019-06-01 | Disposition: A | Discharge status: Home / Self Care | Payer: Federal, State, Local not specified - PPO | Source: Ambulatory Visit | Attending: General Surgery | Admitting: General Surgery

## 2019-06-01 ENCOUNTER — Other Ambulatory Visit: Payer: Self-pay

## 2019-06-01 ENCOUNTER — Encounter (HOSPITAL_COMMUNITY): Payer: Self-pay

## 2019-06-01 DIAGNOSIS — R9431 Abnormal electrocardiogram [ECG] [EKG]: Secondary | ICD-10-CM | POA: Diagnosis not present

## 2019-06-01 DIAGNOSIS — Z01818 Encounter for other preprocedural examination: Secondary | ICD-10-CM | POA: Insufficient documentation

## 2019-06-01 DIAGNOSIS — I10 Essential (primary) hypertension: Secondary | ICD-10-CM | POA: Insufficient documentation

## 2019-06-01 HISTORY — DX: Unspecified osteoarthritis, unspecified site: M19.90

## 2019-06-01 HISTORY — DX: Other complications of procedures, not elsewhere classified, initial encounter: T81.89XA

## 2019-06-01 LAB — BASIC METABOLIC PANEL
Anion gap: 8 (ref 5–15)
BUN: 5 mg/dL — ABNORMAL LOW (ref 6–20)
CO2: 31 mmol/L (ref 22–32)
Calcium: 9.7 mg/dL (ref 8.9–10.3)
Chloride: 97 mmol/L — ABNORMAL LOW (ref 98–111)
Creatinine, Ser: 0.76 mg/dL (ref 0.44–1.00)
GFR calc Af Amer: >60 mL/min (ref >60)
GFR calc non Af Amer: >60 mL/min (ref >60)
Glucose, Bld: 146 mg/dL — ABNORMAL HIGH (ref 70–99)
Potassium: 3.6 mmol/L (ref 3.5–5.1)
Sodium: 136 mmol/L (ref 135–145)

## 2019-06-01 LAB — CBC
HCT: 40.6 % (ref 36.0–46.0)
Hemoglobin: 13.4 g/dL (ref 12.0–15.0)
MCH: 28.8 pg (ref 26.0–34.0)
MCHC: 33 g/dL (ref 30.0–36.0)
MCV: 87.1 fL (ref 80.0–100.0)
Platelets: 374 10*3/uL (ref 150–400)
RBC: 4.66 MIL/uL (ref 3.87–5.11)
RDW: 12.7 % (ref 11.5–15.5)
WBC: 7.2 10*3/uL (ref 4.0–10.5)
nRBC: 0 % (ref 0.0–0.2)

## 2019-06-01 NOTE — Progress Notes (Signed)
Pt denies SOB, chest pain, and being under the care of a cardiologist.  Pt stated that she is under the care of Dr. [name], PCP. Pt denies having a cardiac cath. Pt denies having an EKG and chest x ray in the last year. Pt denies recent labs. Pt verbalized understanding of all pre-op instructions.

## 2019-06-01 NOTE — Pre-Procedure Instructions (Signed)
Briana Price  06/01/2019     [facility] [place], [place] - [address] AT [address] [place] [address] Phone: [phone] Fax: [phone]   Your procedure is scheduled on Wednesday, June 08, 2019  Report to [facility] Admitting at 12:30 P.M.  Call this number if you have problems the morning of surgery:  [phone]   Remember: Brush your teeth the morning of surgery with your regular toothpaste.  Do not eat  after midnight Tuesday, June 07, 2019  You may drink clear liquids until 11:30 A.M. the morning of surgery .  Clear liquids allowed are:  Water, Juice (non-citric and without pulp), Carbonated beverages, Clear Tea, Black Coffee only, Plain Jell-O only, Gatorade and Plain Popsicles only    Take these medicines the morning of surgery with A SIP OF WATER : atorvastatin (LIPITOR)  If needed: acetaminophen (TYLENOL) for pain or headache  Stop taking Aspirin (unless otherwise advised by surgeon), vitamins, fish oil and herbal medications. Do not take any NSAIDs ie: Ibuprofen, Advil, Naproxen (Aleve), Motrin, BC and Goody Powder; stop now.    Do not wear jewelry, make-up or nail polish.  Do not wear lotions, powders, or perfumes, or deodorant.  Do not shave 48 hours prior to surgery.  Do not bring valuables to the hospital.  [facility] is not responsible for any belongings or valuables.  Contacts, dentures or bridgework may not be worn into surgery.  Leave your suitcase in the car.  After surgery it may be brought to your room. For patients admitted to the hospital, discharge time will be determined by your treatment team.  Patients discharged the day of surgery will not be allowed to drive home.  Special instructions: See " [facility] Preparing For Surgery " sheet. Please read over the following fact sheets that you were given. Pain Booklet, Coughing and Deep Breathing and Surgical Site  Infection Prevention

## 2019-06-06 ENCOUNTER — Other Ambulatory Visit (HOSPITAL_COMMUNITY)
Admission: RE | Admit: 2019-06-06 | Discharge: 2019-06-06 | Disposition: A | Discharge status: Home / Self Care | Payer: Federal, State, Local not specified - PPO | Source: Ambulatory Visit | Attending: General Surgery | Admitting: General Surgery

## 2019-06-06 DIAGNOSIS — Z20828 Contact with and (suspected) exposure to other viral communicable diseases: Secondary | ICD-10-CM | POA: Insufficient documentation

## 2019-06-06 DIAGNOSIS — Z01812 Encounter for preprocedural laboratory examination: Secondary | ICD-10-CM | POA: Diagnosis not present

## 2019-06-06 LAB — SARS CORONAVIRUS 2 (TAT 6-24 HRS): SARS Coronavirus 2: NEGATIVE

## 2019-06-08 ENCOUNTER — Ambulatory Visit (HOSPITAL_COMMUNITY): Payer: Federal, State, Local not specified - PPO | Admitting: Vascular Surgery

## 2019-06-08 ENCOUNTER — Other Ambulatory Visit: Payer: Self-pay

## 2019-06-08 ENCOUNTER — Encounter (HOSPITAL_COMMUNITY): Payer: Self-pay

## 2019-06-08 ENCOUNTER — Ambulatory Visit (HOSPITAL_COMMUNITY)
Admission: RE | Admit: 2019-06-08 | Discharge: 2019-06-08 | Disposition: A | Discharge status: Home / Self Care | Payer: Federal, State, Local not specified - PPO | Attending: General Surgery | Admitting: General Surgery

## 2019-06-08 ENCOUNTER — Ambulatory Visit (HOSPITAL_COMMUNITY): Payer: Federal, State, Local not specified - PPO | Admitting: Anesthesiology

## 2019-06-08 ENCOUNTER — Encounter (HOSPITAL_COMMUNITY)
Admission: RE | Disposition: A | Discharge status: Home / Self Care | Payer: Self-pay | Source: Home / Self Care | Attending: General Surgery

## 2019-06-08 ENCOUNTER — Other Ambulatory Visit (HOSPITAL_COMMUNITY): Payer: Federal, State, Local not specified - PPO

## 2019-06-08 DIAGNOSIS — Z853 Personal history of malignant neoplasm of breast: Secondary | ICD-10-CM | POA: Insufficient documentation

## 2019-06-08 DIAGNOSIS — Z87891 Personal history of nicotine dependence: Secondary | ICD-10-CM | POA: Diagnosis not present

## 2019-06-08 DIAGNOSIS — Z9013 Acquired absence of bilateral breasts and nipples: Secondary | ICD-10-CM | POA: Insufficient documentation

## 2019-06-08 DIAGNOSIS — K219 Gastro-esophageal reflux disease without esophagitis: Secondary | ICD-10-CM | POA: Diagnosis not present

## 2019-06-08 DIAGNOSIS — Z9221 Personal history of antineoplastic chemotherapy: Secondary | ICD-10-CM | POA: Insufficient documentation

## 2019-06-08 DIAGNOSIS — I10 Essential (primary) hypertension: Secondary | ICD-10-CM | POA: Diagnosis not present

## 2019-06-08 DIAGNOSIS — Z6837 Body mass index (BMI) 37.0-37.9, adult: Secondary | ICD-10-CM | POA: Insufficient documentation

## 2019-06-08 DIAGNOSIS — L7682 Other postprocedural complications of skin and subcutaneous tissue: Secondary | ICD-10-CM | POA: Diagnosis not present

## 2019-06-08 DIAGNOSIS — Z79899 Other long term (current) drug therapy: Secondary | ICD-10-CM | POA: Insufficient documentation

## 2019-06-08 DIAGNOSIS — D649 Anemia, unspecified: Secondary | ICD-10-CM | POA: Diagnosis not present

## 2019-06-08 DIAGNOSIS — E669 Obesity, unspecified: Secondary | ICD-10-CM | POA: Insufficient documentation

## 2019-06-08 DIAGNOSIS — T8131XA Disruption of external operation (surgical) wound, not elsewhere classified, initial encounter: Secondary | ICD-10-CM | POA: Insufficient documentation

## 2019-06-08 DIAGNOSIS — Y838 Other surgical procedures as the cause of abnormal reaction of the patient, or of later complication, without mention of misadventure at the time of the procedure: Secondary | ICD-10-CM | POA: Insufficient documentation

## 2019-06-08 HISTORY — PX: EXCISION OF BREAST LESION: SHX6676

## 2019-06-08 SURGERY — EXCISION OF BREAST LESION
Anesthesia: General | Site: Chest | Laterality: Right

## 2019-06-08 MED ORDER — CHLORHEXIDINE GLUCONATE CLOTH 2 % EX PADS: 6.0000 | MEDICATED_PAD | Freq: Once | CUTANEOUS | Status: DC

## 2019-06-08 MED ORDER — LIDOCAINE 2% (20 MG/ML) 5 ML SYRINGE
INTRAMUSCULAR | Status: DC | PRN
  Administered 2019-06-08: 100 mg via INTRAVENOUS

## 2019-06-08 MED ORDER — DEXAMETHASONE SODIUM PHOSPHATE 10 MG/ML IJ SOLN
INTRAMUSCULAR | Status: DC | PRN
  Administered 2019-06-08: 5 mg via INTRAVENOUS

## 2019-06-08 MED ORDER — LIDOCAINE HCL (PF) 1 % IJ SOLN
INTRAMUSCULAR | Status: AC
  Filled 2019-06-08: qty 30

## 2019-06-08 MED ORDER — OXYCODONE HCL 5 MG PO TABS: 5.0000 mg | ORAL_TABLET | Freq: Once | ORAL | Status: DC | PRN

## 2019-06-08 MED ORDER — GABAPENTIN 300 MG PO CAPS
300.0000 mg | ORAL_CAPSULE | ORAL | Status: AC
  Administered 2019-06-08: 13:00:00 300 mg via ORAL

## 2019-06-08 MED ORDER — ONDANSETRON HCL 4 MG/2ML IJ SOLN
INTRAMUSCULAR | Status: DC | PRN
  Administered 2019-06-08: 4 mg via INTRAVENOUS

## 2019-06-08 MED ORDER — MIDAZOLAM HCL 2 MG/2ML IJ SOLN
INTRAMUSCULAR | Status: AC
  Filled 2019-06-08: qty 2

## 2019-06-08 MED ORDER — LIDOCAINE HCL 1 % IJ SOLN
INTRAMUSCULAR | Status: DC | PRN
  Administered 2019-06-08: 50 mL via INTRAMUSCULAR

## 2019-06-08 MED ORDER — FENTANYL CITRATE (PF) 250 MCG/5ML IJ SOLN
INTRAMUSCULAR | Status: DC | PRN
  Administered 2019-06-08: 25 ug via INTRAVENOUS
  Administered 2019-06-08: 50 ug via INTRAVENOUS
  Administered 2019-06-08: 25 ug via INTRAVENOUS

## 2019-06-08 MED ORDER — ACETAMINOPHEN 500 MG PO TABS
ORAL_TABLET | ORAL | Status: AC
  Administered 2019-06-08: 1000 mg via ORAL
  Filled 2019-06-08: qty 2

## 2019-06-08 MED ORDER — GABAPENTIN 300 MG PO CAPS
ORAL_CAPSULE | ORAL | Status: AC
  Administered 2019-06-08: 300 mg via ORAL
  Filled 2019-06-08: qty 1

## 2019-06-08 MED ORDER — FENTANYL CITRATE (PF) 250 MCG/5ML IJ SOLN
INTRAMUSCULAR | Status: AC
  Filled 2019-06-08: qty 5

## 2019-06-08 MED ORDER — OXYCODONE HCL 5 MG PO TABS: 5.0000 mg | ORAL_TABLET | Freq: Four times a day (QID) | ORAL | 0 refills | Status: DC | PRN

## 2019-06-08 MED ORDER — DEXAMETHASONE SODIUM PHOSPHATE 10 MG/ML IJ SOLN
INTRAMUSCULAR | Status: AC
  Filled 2019-06-08: qty 1

## 2019-06-08 MED ORDER — KETOROLAC TROMETHAMINE 30 MG/ML IJ SOLN: 30.0000 mg | Freq: Once | INTRAMUSCULAR | Status: DC | PRN

## 2019-06-08 MED ORDER — BUPIVACAINE-EPINEPHRINE (PF) 0.25% -1:200000 IJ SOLN
INTRAMUSCULAR | Status: AC
  Filled 2019-06-08: qty 30

## 2019-06-08 MED ORDER — HYDROMORPHONE HCL 1 MG/ML IJ SOLN: 0.2500 mg | INTRAMUSCULAR | Status: DC | PRN

## 2019-06-08 MED ORDER — MEPERIDINE HCL 25 MG/ML IJ SOLN: 6.2500 mg | INTRAMUSCULAR | Status: DC | PRN

## 2019-06-08 MED ORDER — PROMETHAZINE HCL 25 MG/ML IJ SOLN: 6.2500 mg | INTRAMUSCULAR | Status: DC | PRN

## 2019-06-08 MED ORDER — ACETAMINOPHEN 500 MG PO TABS
1000.0000 mg | ORAL_TABLET | ORAL | Status: AC
  Administered 2019-06-08: 13:00:00 1000 mg via ORAL

## 2019-06-08 MED ORDER — CEFAZOLIN SODIUM-DEXTROSE 2-4 GM/100ML-% IV SOLN
2.0000 g | INTRAVENOUS | Status: AC
  Administered 2019-06-08: 16:00:00 2 g via INTRAVENOUS

## 2019-06-08 MED ORDER — OXYCODONE HCL 5 MG/5ML PO SOLN: 5.0000 mg | Freq: Once | ORAL | Status: DC | PRN

## 2019-06-08 MED ORDER — LACTATED RINGERS IV SOLN
INTRAVENOUS | Status: DC
  Administered 2019-06-08: 13:00:00 via INTRAVENOUS

## 2019-06-08 MED ORDER — PROPOFOL 10 MG/ML IV BOLUS
INTRAVENOUS | Status: AC
  Filled 2019-06-08: qty 20

## 2019-06-08 MED ORDER — ONDANSETRON HCL 4 MG/2ML IJ SOLN
INTRAMUSCULAR | Status: AC
  Filled 2019-06-08: qty 2

## 2019-06-08 MED ORDER — CEFAZOLIN SODIUM-DEXTROSE 2-4 GM/100ML-% IV SOLN
INTRAVENOUS | Status: AC
  Filled 2019-06-08: qty 100

## 2019-06-08 MED ORDER — PROPOFOL 10 MG/ML IV BOLUS
INTRAVENOUS | Status: DC | PRN
  Administered 2019-06-08: 150 mg via INTRAVENOUS

## 2019-06-08 SURGICAL SUPPLY — 30 items
ADH SKN CLS APL DERMABOND .7 (GAUZE/BANDAGES/DRESSINGS) ×1
APL PRP STRL LF DISP 70% ISPRP (MISCELLANEOUS) ×1
BLADE CLIPPER SURG (BLADE) IMPLANT
CANISTER SUCT 3000ML PPV (MISCELLANEOUS) ×2 IMPLANT
CHLORAPREP W/TINT 26 (MISCELLANEOUS) ×1 IMPLANT
COVER SURGICAL LIGHT HANDLE (MISCELLANEOUS) ×2 IMPLANT
COVER WAND RF STERILE (DRAPES) ×2 IMPLANT
DERMABOND ADVANCED (GAUZE/BANDAGES/DRESSINGS) ×1
DERMABOND ADVANCED .7 DNX12 (GAUZE/BANDAGES/DRESSINGS) IMPLANT
DRAPE LAPAROTOMY TRNSV 102X78 (DRAPES) ×2 IMPLANT
ELECT REM PT RETURN 9FT ADLT (ELECTROSURGICAL) ×2
ELECTRODE REM PT RTRN 9FT ADLT (ELECTROSURGICAL) ×1 IMPLANT
GAUZE SPONGE 4X4 12PLY STRL (GAUZE/BANDAGES/DRESSINGS) ×2 IMPLANT
GLOVE BIO SURGEON STRL SZ 6 (GLOVE) ×2 IMPLANT
GLOVE INDICATOR 6.5 STRL GRN (GLOVE) ×2 IMPLANT
GOWN STRL REUS W/ TWL LRG LVL3 (GOWN DISPOSABLE) ×1 IMPLANT
GOWN STRL REUS W/TWL 2XL LVL3 (GOWN DISPOSABLE) ×4 IMPLANT
GOWN STRL REUS W/TWL LRG LVL3 (GOWN DISPOSABLE) ×2
KIT BASIN OR (CUSTOM PROCEDURE TRAY) ×2 IMPLANT
KIT TURNOVER KIT B (KITS) ×2 IMPLANT
NS IRRIG 1000ML POUR BTL (IV SOLUTION) ×2 IMPLANT
PACK GENERAL/GYN (CUSTOM PROCEDURE TRAY) ×2 IMPLANT
PAD ARMBOARD 7.5X6 YLW CONV (MISCELLANEOUS) ×4 IMPLANT
PENCIL SMOKE EVACUATOR (MISCELLANEOUS) ×2 IMPLANT
SPONGE LAP 18X18 RF (DISPOSABLE) ×2 IMPLANT
SUT PROLENE 2 0 SH 30 (SUTURE) ×2 IMPLANT
SWAB COLLECTION DEVICE MRSA (MISCELLANEOUS) ×2 IMPLANT
SWAB CULTURE ESWAB REG 1ML (MISCELLANEOUS) ×2 IMPLANT
TOWEL GREEN STERILE (TOWEL DISPOSABLE) ×2 IMPLANT
TOWEL GREEN STERILE FF (TOWEL DISPOSABLE) ×2 IMPLANT

## 2019-06-08 NOTE — Discharge Instructions (Addendum)
General Anesthesia, Adult, Care After °This sheet gives you information about how to care for yourself after your procedure. Your health care provider may also give you more specific instructions. If you have problems or questions, contact your health care provider. °What can I expect after the procedure? °After the procedure, the following side effects are common: °· Pain or discomfort at the IV site. °· Nausea. °· Vomiting. °· Sore throat. °· Trouble concentrating. °· Feeling cold or chills. °· Weak or tired. °· Sleepiness and fatigue. °· Soreness and body aches. These side effects can affect parts of the body that were not involved in surgery. °Follow these instructions at home: ° °For at least 24 hours after the procedure: °· Have a responsible adult stay with you. It is important to have someone help care for you until you are awake and alert. °· Rest as needed. °· Do not: °? Participate in activities in which you could fall or become injured. °? Drive. °? Use heavy machinery. °? Drink alcohol. °? Take sleeping pills or medicines that cause drowsiness. °? Make important decisions or sign legal documents. °? Take care of children on your own. °Eating and drinking °· Follow any instructions from your health care provider about eating or drinking restrictions. °· When you feel hungry, start by eating small amounts of foods that are soft and easy to digest (bland), such as toast. Gradually return to your regular diet. °· Drink enough fluid to keep your urine pale yellow. °· If you vomit, rehydrate by drinking water, juice, or clear broth. °General instructions °· If you have sleep apnea, surgery and certain medicines can increase your risk for breathing problems. Follow instructions from your health care provider about wearing your sleep device: °? Anytime you are sleeping, including during daytime naps. °? While taking prescription pain medicines, sleeping medicines, or medicines that make you drowsy. °· Return to  your normal activities as told by your health care provider. Ask your health care provider what activities are safe for you. °· Take over-the-counter and prescription medicines only as told by your health care provider. °· If you smoke, do not smoke without supervision. °· Keep all follow-up visits as told by your health care provider. This is important. °Contact a health care provider if: °· You have nausea or vomiting that does not get better with medicine. °· You cannot eat or drink without vomiting. °· You have pain that does not get better with medicine. °· You are unable to pass urine. °· You develop a skin rash. °· You have a fever. °· You have redness around your IV site that gets worse. °Get help right away if: °· You have difficulty breathing. °· You have chest pain. °· You have blood in your urine or stool, or you vomit blood. °Summary °· After the procedure, it is common to have a sore throat or nausea. It is also common to feel tired. °· Have a responsible adult stay with you for the first 24 hours after general anesthesia. It is important to have someone help care for you until you are awake and alert. °· When you feel hungry, start by eating small amounts of foods that are soft and easy to digest (bland), such as toast. Gradually return to your regular diet. °· Drink enough fluid to keep your urine pale yellow. °· Return to your normal activities as told by your health care provider. Ask your health care provider what activities are safe for you. °This information is not   intended to replace advice given to you by your health care provider. Make sure you discuss any questions you have with your health care provider. Document Released: 12/01/2000 Document Revised: 08/28/2017 Document Reviewed: 04/10/2017 Elsevier Patient Education  2020 [place] Office Phone Number [phone]   POST OP INSTRUCTIONS  Always review your discharge instruction sheet given to you  by the facility where your surgery was performed.  IF YOU HAVE DISABILITY OR FAMILY LEAVE FORMS, YOU MUST BRING THEM TO THE OFFICE FOR PROCESSING.  DO NOT GIVE THEM TO YOUR DOCTOR.  1. A prescription for pain medication may be given to you upon discharge.  Take your pain medication as prescribed, if needed.  If narcotic pain medicine is not needed, then you may take acetaminophen (Tylenol) or ibuprofen (Advil) as needed. 2. Take your usually prescribed medications unless otherwise directed 3. If you need a refill on your pain medication, please contact your pharmacy.  They will contact our office to request authorization.  Prescriptions will not be filled after 5pm or on week-ends. 4. You should eat very light the first 24 hours after surgery, such as soup, crackers, pudding, etc.  Resume your normal diet the day after surgery 5. It is common to experience some constipation if taking pain medication after surgery.  Increasing fluid intake and taking a stool softener will usually help or prevent this problem from occurring.  A mild laxative (Milk of Magnesia or Miralax) should be taken according to package directions if there are no bowel movements after 48 hours. 6. You may shower in 48 hours.  The surgical glue will flake off in 2-3 weeks.   7. ACTIVITIES:  No strenuous activity or heavy lifting for 1 week.   a. You may drive when you no longer are taking prescription pain medication, you can comfortably wear a seatbelt, and you can safely maneuver your car and apply brakes. b. RETURN TO WORK:  __________5-14 days_______________ Dennis Bast should see your doctor in the office for a follow-up appointment approximately three-four weeks after your surgery.    WHEN TO CALL YOUR DOCTOR: 1. Fever over 101.0 2. Nausea and/or vomiting. 3. Extreme swelling or bruising. 4. Continued bleeding from incision. 5. Increased pain, redness, or drainage from the incision.  The clinic staff is available to answer your  questions during regular business hours.  Please dont hesitate to call and ask to speak to one of the nurses for clinical concerns.  If you have a medical emergency, go to the nearest emergency room or call 911.  A surgeon from [facility] is always on call at the hospital.  For further questions, please visit [email or web address]

## 2019-06-08 NOTE — Transfer of Care (Signed)
Immediate Anesthesia Transfer of Care Note  Patient: Briana Price  Procedure(s) Performed: EXCISION OF NON HEALING WOUND RIGHT CHEST (Right Chest)  Patient Location: PACU  Anesthesia Type:General  Level of Consciousness: awake, alert , oriented and patient cooperative  Airway & Oxygen Therapy: Patient Spontanous Breathing and Patient connected to nasal cannula oxygen  Post-op Assessment: Report given to RN, Post -op Vital signs reviewed and stable and Patient moving all extremities  Post vital signs: Reviewed and stable  Last Vitals:  Vitals Value Taken Time  BP 147/83 06/08/19 1620  Temp    Pulse 80 06/08/19 1623  Resp 12 06/08/19 1623  SpO2 100 % 06/08/19 1623  Vitals shown include unvalidated device data.  Last Pain:  Vitals:   06/08/19 1620  TempSrc:   PainSc: (P) 0-No pain         Complications: No apparent anesthesia complications

## 2019-06-08 NOTE — H&P (Signed)
Briana Price Location: [facility] Patient #: [phone] DOB: [birth year] Married / Language: English / Race: Black or African American Female   History of Present Illness The patient is a 53 year old female who presents for a follow-up for Breast cancer. Pt is a 53 yo F referred by Dr. 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 She received dx mammogram and ultrasound showing 2.5 cm irregular hypoechoic mass at 5:30 as well as a mildly prominent lymph node. She had core needle biopsies of both. 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 The lymph node was negative for cancer on biopsy. She had no other personal history of cancer. menarche was age 42. She is a G1P1 wtih child at age 61. She has had a colonoscopy wtih several polyps wtih Dr. Collene Mares and is on a 5 year follow up schedule for another colonoscopy. She has a significant family history of cancer. Her maternal grandmother and maternal aunt had breast cancer. She has two paternal aunts who had breast cancer and a paternal uncle who had leukemia. Pt had breast MRI since she had triple negative disease. 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 Total size was 5 cm including invasive cancer and DCIS. Pt completed chemotherapy. She has decided on bilateral mastectomy with left sentinel lymph node biopsy and pursuing free flap reconstruction later. She is very against foreign body reconstruction.  She had bilateral mastectomy with left sided sentinel lymph node biopsy 11/19/2017. She had complete pathologic response with 2 neg SLNs.   She had a hematoma post op. She had healed completely, but her wound opened up in a delayed fashion and she required packing. The wound healed over completely and she had surgery scheduled at [facility] in february 2020. She moved this because of her  son's college decision making. She was going to reschedule, but COVID happened. Her son is going to [employer] for college and doing wrestling.   The wounds had healed over completely again, but now she has had reopening on the right. She also developed soreness in the right axilla and her dermatologist put her on doxycycline for hidradenitis.     Allergies No Known Drug Allergies   Allergies Reconciled   Medication History  Atorvastatin Calcium (20MG Tablet, Oral) Active. Lisinopril (10MG Tablet, Oral) Active. Cephalexin (500MG Capsule, Oral) Active. Vitamin D (Cholecalciferol) (1000UNIT Capsule, Oral) Active. Ferro-Sequels (50MG Tablet ER, Oral) Active. HydroDiuril (25MG Tablet, Oral) Active. Ketoconazole (2% Shampoo, External) Active. Clindamycin Phos-Benzoyl Perox (1.2-2.5% Gel, External) Active. Doxycycline Hyclate (150MG Tablet, Oral) Active. Tobramycin-Dexamethasone (0.3-0.1% Suspension, Ophthalmic) Active. SulfaCleanse 8/4 (8-4% Suspension, External) Active. Medications Reconciled    Review of Systems  All other systems negative  Vitals  Weight: 227.25 lb Height: 67in Body Surface Area: 2.13 m Body Mass Index: 35.59 kg/m  Temp.: 64F (Temporal)  Pulse: 101 (Regular)  BP: 128/68(Sitting, Left Arm, Standard)       Physical Exam  General Mental Status-Alert. General Appearance-Consistent with stated age. Hydration-Well hydrated. Voice-Normal.  Head and Neck Head-normocephalic, atraumatic with no lesions or palpable masses.  Eye Sclera/Conjunctiva - Bilateral-No scleral icterus.  Chest and Lung Exam Chest and lung exam reveals -quiet, even and easy respiratory effort with no use of accessory muscles. Inspection Chest Wall - Normal. Back - normal.  Breast Note: bilaterally surgically absent. no palpable nodules on either side. no LAD. The right side  has a 1 cm open area and two additional lateral 1-2 mm  openings. These are both in an area of very thin skin. Granulation tissue is present underneath.   Cardiovascular Cardiovascular examination reveals -normal pedal pulses bilaterally. Note: regular rate and rhythm  Abdomen Inspection-Inspection Normal. Palpation/Percussion Palpation and Percussion of the abdomen reveal - Soft, Non Tender, No Rebound tenderness, No Rigidity (guarding) and No hepatosplenomegaly.  Peripheral Vascular Upper Extremity Inspection - Bilateral - Normal - No Clubbing, No Cyanosis, No Edema, Pulses Intact. Lower Extremity Palpation - Edema - Bilateral - No edema - Bilateral.  Neurologic Neurologic evaluation reveals -alert and oriented x 3 with no impairment of recent or remote memory. Mental Status-Normal.  Musculoskeletal Global Assessment -Note: no gross deformities.  Normal Exam - Left-Upper Extremity Strength Normal and Lower Extremity Strength Normal. Normal Exam - Right-Upper Extremity Strength Normal and Lower Extremity Strength Normal.  Lymphatic Head & Neck  General Head & Neck Lymphatics: Bilateral - Description - Normal. Axillary  General Axillary Region: Bilateral - Description - Normal. Tenderness - Non Tender.    Assessment & Plan  PRIMARY CANCER OF LOWER OUTER QUADRANT OF LEFT FEMALE BREAST (C50.512) Impression: No clinical evidence of disease.  reconstruction per patient and Dr. Lynnell Jude at [facility].   OPEN WOUND OF BREAST (S21.009A) Impression: Given that it has been a year and a half and her wound continues to open up, I will excise this area of thin skin that continues to have issues. I would also close this with nylons this time. I would plan to wash it out as well. I would also place Acell micromatrix powder to facilitate healing.  Current Plans You are being scheduled for surgery- Our schedulers will call you.  You should hear from our office's scheduling department within 5 working days about the location,  date, and time of surgery. We try to make accommodations for patient's preferences in scheduling surgery, but sometimes the OR schedule or the surgeon's schedule prevents [place] from making those accommodations.  If you have not heard from our office [phone]) in 5 working days, call the office and ask for your surgeon's nurse.  If you have other questions about your diagnosis, plan, or surgery, call the office and ask for your surgeon's nurse.    Signed by [name], MD

## 2019-06-08 NOTE — Anesthesia Preprocedure Evaluation (Signed)
Anesthesia Evaluation  Patient identified by MRN, date of birth, ID band Patient awake    Reviewed: Allergy & Precautions, NPO status , Patient's Chart, lab work & pertinent test results  Airway Mallampati: II  TM Distance: >3 FB Neck ROM: Full    Dental no notable dental hx. (+) Teeth Intact, Dental Advisory Given   Pulmonary former smoker,    Pulmonary exam normal breath sounds clear to auscultation       Cardiovascular hypertension, Pt. on medications Normal cardiovascular exam Rhythm:Regular Rate:Normal     Neuro/Psych  Headaches, negative psych ROS   GI/Hepatic Neg liver ROS, GERD  Medicated and Controlled,  Endo/Other  Obesity BMI 37   Renal/GU negative Renal ROS  negative genitourinary   Musculoskeletal  (+) Arthritis , Osteoarthritis,    Abdominal (+) + obese,   Peds  Hematology  (+) anemia ,   Anesthesia Other Findings S/p B/L mastectomy and LN biopsy [id number] complicated by hematoma postop and now wound dehiscence on right  Reproductive/Obstetrics negative OB ROS                             Anesthesia Physical  Anesthesia Plan  ASA: II  Anesthesia Plan: General   Post-op Pain Management:    Induction: Intravenous  PONV Risk Score and Plan: 3 and Ondansetron, Dexamethasone, Midazolam, Propofol infusion and Treatment may vary due to age or medical condition  Airway Management Planned: LMA  Additional Equipment: None  Intra-op Plan:   Post-operative Plan: Extubation in OR  Informed Consent: I have reviewed the patients History and Physical, chart, labs and discussed the procedure including the risks, benefits and alternatives for the proposed anesthesia with the patient or authorized representative who has indicated his/her understanding and acceptance.     Dental advisory given  Plan Discussed with: CRNA  Anesthesia Plan Comments:         Anesthesia Quick  Evaluation

## 2019-06-08 NOTE — Op Note (Signed)
PRE-OPERATIVE DIAGNOSIS: non healing wound right chest  POST-OPERATIVE DIAGNOSIS:  Same  PROCEDURE:  Procedure(s): Excision of non healing wound 8x2x3 cm  SURGEON:  Surgeon(s): [name], MD  ANESTHESIA:   local and general  DRAINS: none   LOCAL MEDICATIONS USED:  BUPIVICAINE  and LIDOCAINE   SPECIMEN:  Source of Specimen:  right chest wall chronic wound  DISPOSITION OF SPECIMEN:  PATHOLOGY  COUNTS:  YES  DICTATION: .Dragon Dictation  PLAN OF CARE: Discharge to home after PACU  PATIENT DISPOSITION:  PACU - hemodynamically stable.  FINDINGS:  Very thin skin that continues to open up laterally on mastectomy scar  EBL: min  PROCEDURE:  Patient was identified in the holding area and taken operating room where she was placed supine on the operating table.  General anesthesia was induced.  Her right chest wall was prepped and draped in sterile fashion.  A timeout was performed according to the surgical safety checklist.  When all was correct, we continued.  The lateral portion of her mastectomy incision was anesthetized with local.  The 10 blade was used to excise the previous incision which was widened and thinned out.  The cautery was used to dissect the lateral portion of subcutaneous fatty tissue.  Allis clamps were used to elevate the tissue. Hemostasis was achieved with cautery. Total amount of skin and soft tissue debrided was 8x2x3 cm.  Scalpel and cautery was used for the excision.   The skin was then reapproximated with two running 2-0 Prolene sutures.  The wounds were then cleaned, dried, and dressed with gauze, ABDs, and a breast binder.  The patient was allowed to emerge from anesthesia and was taken to the PACU in stable condition.  Needle, sponge, and instrument counts were correct x2.

## 2019-06-08 NOTE — Interval H&P Note (Signed)
History and Physical Interval Note:  06/08/2019 1:03 PM  Briana Price  has presented today for surgery, with the diagnosis of NON HEALING WOUND, HISTORY OF RIGHT MASTECTOMY.  The various methods of treatment have been discussed with the patient and family. After consideration of risks, benefits and other options for treatment, the patient has consented to  Procedure(s): EXCISION OF NON HEALING WOUND RIGHT CHEST (Right) as a surgical intervention.  The patient's history has been reviewed, patient examined, no change in status, stable for surgery.  I have reviewed the patient's chart and labs.  Questions were answered to the patient's satisfaction.     [name]

## 2019-06-08 NOTE — Anesthesia Procedure Notes (Signed)
Procedure Name: LMA Insertion Date/Time: 06/08/2019 3:26 PM Performed by: Myna Bright, CRNA Pre-anesthesia Checklist: Patient identified, Emergency Drugs available, Suction available and Patient being monitored Patient Re-evaluated:Patient Re-evaluated prior to induction Oxygen Delivery Method: Circle system utilized Preoxygenation: Pre-oxygenation with 100% oxygen Induction Type: IV induction Ventilation: Mask ventilation without difficulty LMA: LMA inserted LMA Size: 4.0 Number of attempts: 1 Placement Confirmation: positive ETCO2 and breath sounds checked- equal and bilateral Tube secured with: Tape Dental Injury: Teeth and Oropharynx as per pre-operative assessment

## 2019-06-08 NOTE — Anesthesia Postprocedure Evaluation (Signed)
Anesthesia Post Note  Patient: Briana Price  Procedure(s) Performed: EXCISION OF NON HEALING WOUND RIGHT CHEST (Right Chest)     Patient location during evaluation: PACU Anesthesia Type: General Level of consciousness: awake Pain management: pain level controlled Vital Signs Assessment: post-procedure vital signs reviewed and stable Respiratory status: spontaneous breathing Cardiovascular status: stable Postop Assessment: no apparent nausea or vomiting Anesthetic complications: no    Last Vitals:  Vitals:   06/08/19 1635 06/08/19 1646  BP: (!) 154/76 (!) 153/81  Pulse: 82   Resp: 11   Temp:    SpO2: 100%     Last Pain:  Vitals:   06/08/19 1646  TempSrc:   PainSc: 0-No pain                 Gunda Maqueda

## 2019-06-09 ENCOUNTER — Encounter (HOSPITAL_COMMUNITY): Payer: Self-pay | Admitting: General Surgery

## 2019-06-10 LAB — SURGICAL PATHOLOGY

## 2019-06-11 NOTE — Progress Notes (Signed)
Please let patient know pathology is benign.

## 2019-06-20 ENCOUNTER — Inpatient Hospital Stay: Payer: Federal, State, Local not specified - PPO | Attending: Hematology

## 2019-06-20 ENCOUNTER — Other Ambulatory Visit: Payer: Self-pay

## 2019-06-20 DIAGNOSIS — Z95828 Presence of other vascular implants and grafts: Secondary | ICD-10-CM

## 2019-06-20 DIAGNOSIS — Z452 Encounter for adjustment and management of vascular access device: Secondary | ICD-10-CM | POA: Diagnosis not present

## 2019-06-20 DIAGNOSIS — Z853 Personal history of malignant neoplasm of breast: Secondary | ICD-10-CM | POA: Diagnosis not present

## 2019-06-20 DIAGNOSIS — Z9221 Personal history of antineoplastic chemotherapy: Secondary | ICD-10-CM | POA: Diagnosis not present

## 2019-06-20 DIAGNOSIS — Z171 Estrogen receptor negative status [ER-]: Secondary | ICD-10-CM | POA: Diagnosis not present

## 2019-06-20 MED ORDER — HEPARIN SOD (PORK) LOCK FLUSH 100 UNIT/ML IV SOLN
500.0000 [IU] | Freq: Once | INTRAVENOUS | Status: AC | PRN
  Administered 2019-06-20: 500 [IU] via INTRAVENOUS
  Filled 2019-06-20: qty 5

## 2019-06-20 MED ORDER — SODIUM CHLORIDE 0.9% FLUSH
10.0000 mL | INTRAVENOUS | Status: DC | PRN
  Administered 2019-06-20: 10 mL via INTRAVENOUS
  Filled 2019-06-20: qty 10

## 2019-06-20 NOTE — Patient Instructions (Signed)

## 2019-08-19 ENCOUNTER — Inpatient Hospital Stay: Payer: Federal, State, Local not specified - PPO

## 2019-08-26 ENCOUNTER — Other Ambulatory Visit: Payer: Self-pay

## 2019-08-26 ENCOUNTER — Inpatient Hospital Stay: Payer: Federal, State, Local not specified - PPO | Attending: Hematology

## 2019-08-26 DIAGNOSIS — Z452 Encounter for adjustment and management of vascular access device: Secondary | ICD-10-CM | POA: Insufficient documentation

## 2019-08-26 DIAGNOSIS — Z853 Personal history of malignant neoplasm of breast: Secondary | ICD-10-CM | POA: Insufficient documentation

## 2019-08-26 DIAGNOSIS — Z171 Estrogen receptor negative status [ER-]: Secondary | ICD-10-CM | POA: Insufficient documentation

## 2019-08-26 DIAGNOSIS — Z9013 Acquired absence of bilateral breasts and nipples: Secondary | ICD-10-CM | POA: Diagnosis not present

## 2019-08-26 DIAGNOSIS — Z95828 Presence of other vascular implants and grafts: Secondary | ICD-10-CM

## 2019-08-26 DIAGNOSIS — Z9221 Personal history of antineoplastic chemotherapy: Secondary | ICD-10-CM | POA: Insufficient documentation

## 2019-08-26 MED ORDER — SODIUM CHLORIDE 0.9% FLUSH
10.0000 mL | INTRAVENOUS | Status: DC | PRN
  Administered 2019-08-26: 10 mL via INTRAVENOUS
  Filled 2019-08-26: qty 10

## 2019-08-26 MED ORDER — HEPARIN SOD (PORK) LOCK FLUSH 100 UNIT/ML IV SOLN
500.0000 [IU] | Freq: Once | INTRAVENOUS | Status: AC | PRN
  Administered 2019-08-26: 500 [IU] via INTRAVENOUS
  Filled 2019-08-26: qty 5

## 2019-10-13 NOTE — Progress Notes (Signed)
[place]   [phone] Fax:([phone]) [phone]   Clinic Follow up Note   Patient Care Team: [name], MD as PCP - General (Family Medicine) [name], MD as Consulting Physician (Hematology) [name], MD as Consulting Physician (General Surgery) [name], MD as Consulting Physician (Radiation Oncology) Gardenia Phlegm, NP as Nurse Practitioner (Hematology and Oncology)  Date of Service:  10/20/2019  CHIEF COMPLAINT: Follow up left breastcancer, triple negative  SUMMARY OF ONCOLOGIC HISTORY: Oncology History Overview Note  Cancer Staging Malignant neoplasm of lower-outer quadrant of left breast of female, estrogen receptor negative ([place]) Staging form: Breast, AJCC 8th Edition - Clinical: Stage IIB (cT2, cN0(f), cM0, G3, ER: Negative, PR: Negative, HER2: Negative) - Unsigned     Malignant neoplasm of lower-outer quadrant of left breast of female, estrogen receptor negative ([place])  04/27/2017 Mammogram   IMPRESSION: 1. Highly suspicious mass within the LEFT breast at the 5:30 o'clock axis, 4 cm from the nipple, measuring 2.5 cm, corresponding to the mammographic finding and corresponding to 1 of the palpable lumps in the left breast. Ultrasound-guided biopsy is recommended. 2. Mildly prominent lymph node in the LEFT axilla, with normal cortical thickness but with questionable effacement of the fatty hilum. Ultrasound-guided core biopsy is recommended. 3. No evidence of malignancy within the RIGHT breast. Also, no evidence of malignancy within the second area of clinical concern in the upper outer left breast.   04/29/2017 Initial Diagnosis   Malignant neoplasm of lower-outer quadrant of left breast of female, estrogen receptor negative ([place])   04/29/2017 Receptors her2   Estrogen Receptor: 0%, NEGATIVE Progesterone Receptor: 0%, NEGATIVE Proliferation Marker Ki67: 90% HER2 NEGATIVE    04/29/2017 Initial Biopsy    Diagnosis 1.  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 - SEE COMMENT. 2. Lymph node, needle/core biopsy, left axilla - THERE IN NO EVIDENCE OF CARCINOMA IN 1 OF 1 LYMPH NODE (0/1).   05/14/2017 Imaging   MRI of Breast Bilateral 05/14/17  IMPRESSION: 1. 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 2. Non mass enhancement extending approximately 3.2 cm anterior to the mass in the lower inner quadrant of the left breast, suspicious for DCIS. There is no correlate on recent mammography. The overall extent of the mass and non mass enhancement is approximately 5 cm. 3. No MRI evidence of malignancy involving the right breast. 4. No pathologic lymphadenopathy. Upper normal sized left axillary lymph nodes, the largest of which was previously biopsied no evidence of metastatic disease.   05/15/2017 Imaging   Bone scan whole body 05/15/17 IMPRESSION: No findings specific for metastatic disease to bone.   05/15/2017 Imaging   CT CAP W contrast 05/15/17  IMPRESSION: 1. 18 mm soft tissue lesion inferior left breast compatible with known primary malignancy. 2. Mildly enlarged left axillary lymph nodes in this patient with biopsy-proven metastatic disease to the left axilla. 3. No evidence for lymphadenopathy elsewhere in the chest. No lymphadenopathy in the abdomen or pelvis. No evidence for metastatic disease in the abdomen or pelvis. 4. Tiny right perifissural lung nodule most likely subpleural lymph node. Attention on follow-up recommended. 5. 7 mm hypoattenuating lesion in the spleen, likely a cyst or pseudocyst. Attention on follow-up recommended. 6. Uterine fibroids. 7.  Aortic Atherosclerois (ICD10-170.0)   05/19/2017 Procedure   Port placement by Dr. [name] on 9/11/8  05/22/2017 - 10/09/2017 Chemotherapy    neoadjuvant  chemotherpy AC every 2 weeks for 4 cycles starting 05/22/17 and completed on 07/07/17, followed by weekly paclitaxel for 12 cycles starting 07/24/17 to complete on 10/09/17    05/22/2017 Pathology Results   Diagnosis Breast, left, needle core biopsy, lower inner quad - DUCTAL CARCINOMA IN SITU. 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  The genes analyzed were the 46 genes on [facility]'s Common Cancers panel (APC, ATM, AXIN2, BARD1, BMPR1A, BRCA1, BRCA2, BRIP1, CDH1, CDKN2A, CHEK2, CTNNA1, DICER1, EPCAM, GREM1, HOXB13, KIT, MEN1, MLH1, MSH2, MSH3, MSH6, MUTYH, NBN, NF1, NTHL1, PALB2, PDGFRA, PMS2, POLD1, POLE, PTEN, RAD50, RAD51C, RAD51D, SDHA, SDHB, SDHC, SDHD, SMAD4, SMARCA4, STK11, TP53, TSC1, TSC2, VHL).     08/14/2017 Echocardiogram   EF 60-65%  Study Conclusions  - Left ventricle: The cavity size was normal. Systolic function was   normal. The estimated ejection fraction was in the range of 60%   to 65%. Wall motion was normal; there were no regional wall   motion abnormalities. Doppler parameters are consistent with   abnormal left ventricular relaxation (grade 1 diastolic   dysfunction). - Pulmonary arteries: PA peak pressure: 31 mm Hg (S).  Impressions:  - No change from September 2018 study (strain rate not performed on   that study).      10/14/2017 Imaging   MRI Breast Bilateral  IMPRESSION:  Significant improvement following neoadjuvant treatment.  No residual enhancement in the left breast at sites of known malignancy.  RECOMMENDATION: Treatment plan for known malignancy.   11/19/2017 Surgery   BILATERAL MASTECTOMIES WITH LEFT SENTINEL LYMPH NODE BIOPSY by Dr. [name] 11/19/17   11/19/2017 Pathology Results    Diagnosis 11/19/17 1. Breast, simple mastectomy, Right - FIBROCYSTIC AND FIBROADENOMATOID CHANGE. - NO MALIGNANCY IDENTIFIED. 2.  Breast, simple mastectomy, Left - NO RESIDUAL CARCINOMA IDENTIFIED. - BIOPSY SITES. - FIBROCYSTIC AND FIBROADENOMATOID CHANGE. - SEE ONCOLOGY TABLE. 3. Lymph node, sentinel, biopsy, Left Axillary #1 - ONE OF ONE LYMPH NODES NEGATIVE FOR CARCINOMA (0/1). - BIOPSY SITE. 4. Lymph node, sentinel, biopsy, Left Axillary # 2 - ONE OF ONE LYMPH NODES NEGATIVE FOR CARCINOMA (0/1).   12/29/2018 Imaging   CT CAP W Contrast  IMPRESSION: No evidence of recurrent or metastatic carcinoma. No other acute findings.   3.5 cm right uterine fibroid.   Colonic diverticulosis, without radiographic evidence of diverticulitis.        CURRENT THERAPY:  Surveillance  INTERVAL HISTORY:  Briana Price is here for a follow up of left breast cancer. She was last seen by me 6 months ago. She presents to the clinic alone. She notes due to incomplete healing of her breast surgery she underwent mild excision on 06/08/19 to help this healed. She notes she plans to set up her breast reconstruction soon.  She notes she has increased arthritis pain in her back. She notes sitting down and leaning forward can help and make pain go away. She notes this can limit her activity levels. She has been seen by ortho surgeon in the past about this.  She notes mild irregular BM. She has had her first screening mammogram with Dr. Collene Mares in 2014 and has repeated in the past year.     REVIEW OF SYSTEMS:   Constitutional: Denies fevers, chills or abnormal weight loss Eyes: Denies blurriness of vision Ears, nose, mouth, throat, and face:  Denies mucositis or sore throat Respiratory: Denies cough, dyspnea or wheezes Cardiovascular: Denies palpitation, chest discomfort or lower extremity swelling Gastrointestinal:  Denies nausea, heartburn or change in bowel habits Skin: Denies abnormal skin rashes MSK: (+) Worsened arthritis in her back  Lymphatics: Denies new lymphadenopathy or easy bruising Neurological:Denies numbness,  tingling or new weaknesses Behavioral/Psych: Mood is stable, no new changes  All other systems were reviewed with the patient and are negative.  MEDICAL HISTORY:  Past Medical History:  Diagnosis Date  . Anemia   . Arthritis   . Breast cancer ([place])   . Cancer [facility])    left breast cancer  . Family history of breast cancer   . Genetic testing 05/28/2017   Common Cancers panel (46 genes) @ [facility] - No pathogenic mutations detected  . GERD (gastroesophageal reflux disease)   . Headache    Migraines  . Hypertension   . Non-healing surgical wound    right chest wall    SURGICAL HISTORY: Past Surgical History:  Procedure Laterality Date  . BREAST SURGERY     biopsy  . CESAREAN SECTION    . CHOLECYSTECTOMY N/A 02/07/2013   Procedure: LAPAROSCOPIC CHOLECYSTECTOMY WITH INTRAOPERATIVE CHOLANGIOGRAM;  Surgeon: Gwenyth Ober, MD;  Location: [place];  Service: General;  Laterality: N/A;  . COLONOSCOPY W/ BIOPSIES AND POLYPECTOMY    . COLONOSCOPY W/ BIOPSIES AND POLYPECTOMY    . EXCISION OF BREAST LESION Right 06/08/2019   Procedure: EXCISION OF NON HEALING WOUND RIGHT CHEST;  Surgeon: [name], MD;  Location: [place];  Service: General;  Laterality: Right;  . FOOT SURGERY  2003   lt foot bunionectomy  . MASTECTOMY W/ SENTINEL NODE BIOPSY Bilateral 11/19/2017   Procedure: BILATERAL MASTECTOMIES WITH LEFT SENTINEL LYMPH NODE BIOPSY;  Surgeon: [name], MD;  Location: [place];  Service: General;  Laterality: Bilateral;  . MULTIPLE TOOTH EXTRACTIONS    . PORTACATH PLACEMENT Right 05/19/2017   Procedure: INSERTION PORT-A-CATH;  Surgeon: [name], MD;  Location: [place];  Service: General;  Laterality: Right;    I have reviewed the social history and family history with the patient and they are unchanged from previous note.  ALLERGIES:  has No Known Allergies.  MEDICATIONS:  Current Outpatient Medications  Medication Sig Dispense Refill  .  acetaminophen (TYLENOL) 500 MG tablet Take 1,000 mg by mouth every 8 (eight) hours as needed for moderate pain or headache.    . Ascorbic Acid (VITAMIN C) 1000 MG tablet Take 1,000 mg by mouth daily.    Marland Kitchen atorvastatin (LIPITOR) 20 MG tablet Take 20 mg by mouth daily.  1  . Cholecalciferol (VITAMIN D) 125 MCG (5000 UT) CAPS Take 10,000 Units by mouth daily.     . Doxycycline Hyclate 150 MG TABS 150 mg daily.    . ferrous sulfate 325 (65 FE) MG tablet Take 325 mg by mouth every other day.     . hydrochlorothiazide (HYDRODIURIL) 25 MG tablet Take 25 mg by mouth daily.    Marland Kitchen ketoconazole (NIZORAL) 2 % shampoo Apply 1 application topically as directed.    Marland Kitchen lisinopril (PRINIVIL,ZESTRIL) 10 MG tablet Take 10 mg by mouth daily.   2  . Multiple Vitamins-Minerals (MULTIVITAMIN ADULT PO) Take 1 tablet by mouth daily.    Marland Kitchen oxyCODONE (OXY IR/ROXICODONE) 5 MG immediate release tablet Take 1-2 tablets (5-10 mg total) by mouth every 6 (six) hours as needed for severe pain. 20 tablet 0  . SULFACLEANSE 8/4  8-4 % SUSP Apply 1 application topically daily. Wash face daily     No current facility-administered medications for this visit.   Facility-Administered Medications Ordered in Other Visits  Medication Dose Route Frequency Provider Last Rate Last Admin  . heparin lock flush 100 unit/mL  500 Units Intravenous Once PRN [name], MD      . sodium chloride flush (NS) 0.9 % injection 10 mL  10 mL Intravenous PRN [name], MD   10 mL at 08/07/17 1354  . sodium chloride flush (NS) 0.9 % injection 10 mL  10 mL Intravenous PRN [name], MD        PHYSICAL EXAMINATION: ECOG PERFORMANCE STATUS: 0 - Asymptomatic  Vitals:   10/20/19 1400  BP: (!) 152/92  Pulse: 85  Resp: 18  Temp: 98 F (36.7 C)  SpO2: 100%   Filed Weights   10/20/19 1400  Weight: 231 lb 4.8 oz (104.9 kg)    GENERAL:alert, no distress and comfortable SKIN: skin color, texture, turgor are normal, no rashes or significant lesions EYES:  normal, Conjunctiva are pink and non-injected, sclera clear  NECK: supple, thyroid normal size, non-tender, without nodularity LYMPH:  no palpable lymphadenopathy in the cervical, axillary  LUNGS: clear to auscultation and percussion with normal breathing effort HEART: regular rate & rhythm and no murmurs and no lower extremity edema ABDOMEN:abdomen soft, non-tender and normal bowel sounds Musculoskeletal:no cyanosis of digits and no clubbing  NEURO: alert & oriented x 3 with fluent speech, no focal motor/sensory deficits BREAST: s/p b/l mastectomy: Surgical incision healed well. No palpable mass, nodules or adenopathy bilaterally. Breast exam benign.   LABORATORY DATA:  I have reviewed the data as listed CBC Latest Ref Rng & Units 10/20/2019 06/01/2019 04/20/2019  WBC 4.0 - 10.5 K/uL 7.8 7.2 7.7  Hemoglobin 12.0 - 15.0 g/dL 13.3 13.4 13.2  Hematocrit 36.0 - 46.0 % 39.4 40.6 39.6  Platelets 150 - 400 K/uL 349 374 370     CMP Latest Ref Rng & Units 10/20/2019 06/01/2019 04/20/2019  Glucose 70 - 99 mg/dL 108(H) 146(H) 107(H)  BUN 6 - 20 mg/dL 9 5(L) 7  Creatinine 0.44 - 1.00 mg/dL 0.75 0.76 0.80  Sodium 135 - 145 mmol/L 142 136 139  Potassium 3.5 - 5.1 mmol/L 3.5 3.6 3.7  Chloride 98 - 111 mmol/L 102 97(L) 100  CO2 22 - 32 mmol/L '30 31 29  '$ Calcium 8.9 - 10.3 mg/dL 9.7 9.7 10.1  Total Protein 6.5 - 8.1 g/dL 8.0 - 7.9  Total Bilirubin 0.3 - 1.2 mg/dL 0.9 - 1.1  Alkaline Phos 38 - 126 U/L 91 - 99  AST 15 - 41 U/L 28 - 30  ALT 0 - 44 U/L 33 - 33      RADIOGRAPHIC STUDIES: I have personally reviewed the radiological images as listed and agreed with the findings in the report. No results found.   ASSESSMENT & PLAN:  Briana Price is a 54 y.o. female with    1. 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 She is s/p neoadjuvant AC-T  and b/l mastectomy.She has recovered well from treatment. -She [facility] and plans to proceed by end of 2020. [facility] is still in [facility]. Will remove withbreastreconstruction. Will continue port flush every6-8weeks -She had wound issue and she underwent excision on 06/08/19 and incisions now healed.  -She plans  to set up Breast reconstruction surgery soon.  -She is clinically doing well. Lab reviewed, her CBC and CMP are within normal limits. Her physical exam was unremarkable. There is no clinical concern for recurrence. -She is s/p b/l mastectomy, there is no need for mammograms.  -She still has PAC in place, will continue flush until she has it removed, likely with her reconstruction surgery or separately when ready.  -Continue surveillance. F/u in 6 months    2. 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  3. HTN -Continuefollow-up with her primary care physician  -She is on HCTZ and lisinopril. Willcontinue   4. Lung nodules, Subcapsular spleen cyst -Her restaging CT scan showeda 56m subcapsular cyst andtiny right lung nodule, 1-2 mm, likely benign, follow-up recommended. She never smoked, low risk for lung cancer. -12/28/17 CT CAP showed stable cyst and nodule, no further surveillance scan is needed.  6. Cancer screenings  -She had a colonoscopy with Dr. MCollene Maresin the past 2 years -She is up to date on her Pap Smears, will continue   7. Low back pain  -She has had lumbar back pain before but has worsened recently with relief from sitting and leaning forward. Intermittent, no red flags  -She has been seen by Orthopedic surgeon before, I recommend she f/u with them.    PLAN: -Flush in 1 and 3 months  -she is going to have b/l breast reconstruction in near future and will remove her port during the surgery  -Lab and F/u in 6 months     No problem-specific Assessment & Plan notes found for this encounter.   No orders of the defined types were placed in this encounter.  All questions were answered. The patient knows to call the clinic with any problems, questions or concerns. No barriers to learning was detected. The total time spent in the appointment was 25 minutes.     YTruitt Merle MD 10/20/2019   I, AJoslyn Devon am acting as scribe for YTruitt Merle MD.   I have reviewed the above documentation for accuracy and completeness, and I agree with the above.

## 2019-10-20 ENCOUNTER — Encounter: Payer: Self-pay | Admitting: Hematology

## 2019-10-20 ENCOUNTER — Inpatient Hospital Stay: Payer: Federal, State, Local not specified - PPO | Attending: Hematology

## 2019-10-20 ENCOUNTER — Other Ambulatory Visit: Payer: Self-pay

## 2019-10-20 ENCOUNTER — Inpatient Hospital Stay (HOSPITAL_BASED_OUTPATIENT_CLINIC_OR_DEPARTMENT_OTHER): Payer: Federal, State, Local not specified - PPO | Admitting: Hematology

## 2019-10-20 VITALS — BP 152/92 | HR 85 | Temp 98.0°F | Resp 18 | Ht 66.0 in | Wt 231.3 lb

## 2019-10-20 DIAGNOSIS — Z853 Personal history of malignant neoplasm of breast: Secondary | ICD-10-CM | POA: Insufficient documentation

## 2019-10-20 DIAGNOSIS — D734 Cyst of spleen: Secondary | ICD-10-CM | POA: Insufficient documentation

## 2019-10-20 DIAGNOSIS — Z171 Estrogen receptor negative status [ER-]: Secondary | ICD-10-CM

## 2019-10-20 DIAGNOSIS — I10 Essential (primary) hypertension: Secondary | ICD-10-CM | POA: Insufficient documentation

## 2019-10-20 DIAGNOSIS — Z79899 Other long term (current) drug therapy: Secondary | ICD-10-CM | POA: Insufficient documentation

## 2019-10-20 DIAGNOSIS — Z9013 Acquired absence of bilateral breasts and nipples: Secondary | ICD-10-CM | POA: Diagnosis not present

## 2019-10-20 DIAGNOSIS — C50512 Malignant neoplasm of lower-outer quadrant of left female breast: Secondary | ICD-10-CM

## 2019-10-20 DIAGNOSIS — M549 Dorsalgia, unspecified: Secondary | ICD-10-CM | POA: Insufficient documentation

## 2019-10-20 DIAGNOSIS — R918 Other nonspecific abnormal finding of lung field: Secondary | ICD-10-CM | POA: Insufficient documentation

## 2019-10-20 LAB — COMPREHENSIVE METABOLIC PANEL
ALT: 33 U/L (ref 0–44)
AST: 28 U/L (ref 15–41)
Albumin: 4.1 g/dL (ref 3.5–5.0)
Alkaline Phosphatase: 91 U/L (ref 38–126)
Anion gap: 10 (ref 5–15)
BUN: 9 mg/dL (ref 6–20)
CO2: 30 mmol/L (ref 22–32)
Calcium: 9.7 mg/dL (ref 8.9–10.3)
Chloride: 102 mmol/L (ref 98–111)
Creatinine, Ser: 0.75 mg/dL (ref 0.44–1.00)
GFR calc Af Amer: >60 mL/min (ref >60)
GFR calc non Af Amer: >60 mL/min (ref >60)
Glucose, Bld: 108 mg/dL — ABNORMAL HIGH (ref 70–99)
Potassium: 3.5 mmol/L (ref 3.5–5.1)
Sodium: 142 mmol/L (ref 135–145)
Total Bilirubin: 0.9 mg/dL (ref 0.3–1.2)
Total Protein: 8 g/dL (ref 6.5–8.1)

## 2019-10-20 LAB — CBC WITH DIFFERENTIAL/PLATELET
Abs Immature Granulocytes: 0.02 10*3/uL (ref 0.00–0.07)
Basophils Absolute: 0.1 10*3/uL (ref 0.0–0.1)
Basophils Relative: 1 %
Eosinophils Absolute: 0.1 10*3/uL (ref 0.0–0.5)
Eosinophils Relative: 2 %
HCT: 39.4 % (ref 36.0–46.0)
Hemoglobin: 13.3 g/dL (ref 12.0–15.0)
Immature Granulocytes: 0 %
Lymphocytes Relative: 29 %
Lymphs Abs: 2.3 10*3/uL (ref 0.7–4.0)
MCH: 28.6 pg (ref 26.0–34.0)
MCHC: 33.8 g/dL (ref 30.0–36.0)
MCV: 84.7 fL (ref 80.0–100.0)
Monocytes Absolute: 0.5 10*3/uL (ref 0.1–1.0)
Monocytes Relative: 7 %
Neutro Abs: 4.8 10*3/uL (ref 1.7–7.7)
Neutrophils Relative %: 61 %
Platelets: 349 10*3/uL (ref 150–400)
RBC: 4.65 MIL/uL (ref 3.87–5.11)
RDW: 12.6 % (ref 11.5–15.5)
WBC: 7.8 10*3/uL (ref 4.0–10.5)
nRBC: 0 % (ref 0.0–0.2)

## 2019-10-21 ENCOUNTER — Telehealth: Payer: Self-pay | Admitting: Hematology

## 2019-10-21 NOTE — Telephone Encounter (Signed)
Scheduled appt per 2/11 los.  Sent a message to HIM pool to get a calendar mailed out. 

## 2019-11-18 ENCOUNTER — Other Ambulatory Visit: Payer: Self-pay

## 2019-11-18 ENCOUNTER — Inpatient Hospital Stay: Payer: Federal, State, Local not specified - PPO | Attending: Hematology

## 2019-11-18 DIAGNOSIS — Z452 Encounter for adjustment and management of vascular access device: Secondary | ICD-10-CM | POA: Insufficient documentation

## 2019-11-18 DIAGNOSIS — Z171 Estrogen receptor negative status [ER-]: Secondary | ICD-10-CM | POA: Diagnosis not present

## 2019-11-18 DIAGNOSIS — Z853 Personal history of malignant neoplasm of breast: Secondary | ICD-10-CM | POA: Insufficient documentation

## 2019-11-18 DIAGNOSIS — Z95828 Presence of other vascular implants and grafts: Secondary | ICD-10-CM

## 2019-11-18 DIAGNOSIS — Z9221 Personal history of antineoplastic chemotherapy: Secondary | ICD-10-CM | POA: Diagnosis not present

## 2019-11-18 MED ORDER — HEPARIN SOD (PORK) LOCK FLUSH 100 UNIT/ML IV SOLN
500.0000 [IU] | Freq: Once | INTRAVENOUS | Status: AC | PRN
  Administered 2019-11-18: 500 [IU] via INTRAVENOUS
  Filled 2019-11-18: qty 5

## 2019-11-18 MED ORDER — SODIUM CHLORIDE 0.9% FLUSH
10.0000 mL | INTRAVENOUS | Status: DC | PRN
  Administered 2019-11-18: 16:00:00 10 mL via INTRAVENOUS
  Filled 2019-11-18: qty 10

## 2019-12-23 DIAGNOSIS — E559 Vitamin D deficiency, unspecified: Secondary | ICD-10-CM | POA: Diagnosis not present

## 2019-12-23 DIAGNOSIS — I10 Essential (primary) hypertension: Secondary | ICD-10-CM | POA: Diagnosis not present

## 2019-12-23 DIAGNOSIS — E782 Mixed hyperlipidemia: Secondary | ICD-10-CM | POA: Diagnosis not present

## 2020-01-06 DIAGNOSIS — I10 Essential (primary) hypertension: Secondary | ICD-10-CM | POA: Diagnosis not present

## 2020-01-06 DIAGNOSIS — Z79899 Other long term (current) drug therapy: Secondary | ICD-10-CM | POA: Diagnosis not present

## 2020-01-18 ENCOUNTER — Inpatient Hospital Stay: Payer: Federal, State, Local not specified - PPO

## 2020-01-20 ENCOUNTER — Inpatient Hospital Stay: Payer: Federal, State, Local not specified - PPO | Attending: Hematology

## 2020-01-20 ENCOUNTER — Other Ambulatory Visit: Payer: Self-pay

## 2020-01-20 DIAGNOSIS — Z853 Personal history of malignant neoplasm of breast: Secondary | ICD-10-CM | POA: Diagnosis not present

## 2020-01-20 DIAGNOSIS — Z9221 Personal history of antineoplastic chemotherapy: Secondary | ICD-10-CM | POA: Diagnosis not present

## 2020-01-20 DIAGNOSIS — Z171 Estrogen receptor negative status [ER-]: Secondary | ICD-10-CM | POA: Insufficient documentation

## 2020-01-20 DIAGNOSIS — Z9013 Acquired absence of bilateral breasts and nipples: Secondary | ICD-10-CM | POA: Insufficient documentation

## 2020-01-20 DIAGNOSIS — Z95828 Presence of other vascular implants and grafts: Secondary | ICD-10-CM | POA: Diagnosis not present

## 2020-01-20 DIAGNOSIS — Z452 Encounter for adjustment and management of vascular access device: Secondary | ICD-10-CM | POA: Diagnosis not present

## 2020-01-20 MED ORDER — HEPARIN SOD (PORK) LOCK FLUSH 100 UNIT/ML IV SOLN
500.0000 [IU] | Freq: Once | INTRAVENOUS | Status: AC | PRN
  Administered 2020-01-20: 500 [IU] via INTRAVENOUS
  Filled 2020-01-20: qty 5

## 2020-01-20 MED ORDER — SODIUM CHLORIDE 0.9% FLUSH
10.0000 mL | INTRAVENOUS | Status: DC | PRN
  Administered 2020-01-20: 10 mL via INTRAVENOUS
  Filled 2020-01-20: qty 10

## 2020-01-20 NOTE — Patient Instructions (Signed)

## 2020-02-21 DIAGNOSIS — R0982 Postnasal drip: Secondary | ICD-10-CM | POA: Diagnosis not present

## 2020-02-21 DIAGNOSIS — J040 Acute laryngitis: Secondary | ICD-10-CM | POA: Diagnosis not present

## 2020-03-08 ENCOUNTER — Telehealth: Payer: Self-pay

## 2020-03-08 NOTE — Telephone Encounter (Signed)
Briana Price called stating she had a sore throat and hoarse voice 3-4 weeks ago.  She went to PCP who recommended nasocort spray and oral antihistamine.  She started the nasocort but not the antihistamine.  She states she no longer has pain but her voice waxes and wanes.  She is concerned this could indicate cancer.

## 2020-04-16 NOTE — Progress Notes (Signed)
[place]   [phone] [phone]   Clinic Follow up Note   Patient Care Team: [name], MD as PCP - General (Family Medicine) [name], MD as Consulting Physician (Hematology) [name], MD as Consulting Physician (General Surgery) [name], MD as Consulting Physician (Radiation Oncology) Gardenia Phlegm, NP as Nurse Practitioner (Hematology and Oncology)  Date of Service:  04/19/2020  CHIEF COMPLAINT: Follow up left breastcancer, triple negative  SUMMARY OF ONCOLOGIC HISTORY: Oncology History Overview Note  Cancer Staging Malignant neoplasm of lower-outer quadrant of left breast of female, estrogen receptor negative ([place]) Staging form: Breast, AJCC 8th Edition - Clinical: Stage IIB (cT2, cN0(f), cM0, G3, ER: Negative, PR: Negative, HER2: Negative) - Unsigned     Malignant neoplasm of lower-outer quadrant of left breast of female, estrogen receptor negative ([place])  04/27/2017 Mammogram   IMPRESSION: 1. Highly suspicious mass within the LEFT breast at the 5:30 o'clock axis, 4 cm from the nipple, measuring 2.5 cm, corresponding to the mammographic finding and corresponding to 1 of the palpable lumps in the left breast. Ultrasound-guided biopsy is recommended. 2. Mildly prominent lymph node in the LEFT axilla, with normal cortical thickness but with questionable effacement of the fatty hilum. Ultrasound-guided core biopsy is recommended. 3. No evidence of malignancy within the RIGHT breast. Also, no evidence of malignancy within the second area of clinical concern in the upper outer left breast.   04/29/2017 Initial Diagnosis   Malignant neoplasm of lower-outer quadrant of left breast of female, estrogen receptor negative ([place])   04/29/2017 Receptors her2   Estrogen Receptor: 0%, NEGATIVE Progesterone Receptor: 0%, NEGATIVE Proliferation Marker Ki67: 90% HER2 NEGATIVE    04/29/2017 Initial Biopsy    Diagnosis 1.  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 - SEE COMMENT. 2. Lymph node, needle/core biopsy, left axilla - THERE IN NO EVIDENCE OF CARCINOMA IN 1 OF 1 LYMPH NODE (0/1).   05/14/2017 Imaging   MRI of Breast Bilateral 05/14/17  IMPRESSION: 1. 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 2. Non mass enhancement extending approximately 3.2 cm anterior to the mass in the lower inner quadrant of the left breast, suspicious for DCIS. There is no correlate on recent mammography. The overall extent of the mass and non mass enhancement is approximately 5 cm. 3. No MRI evidence of malignancy involving the right breast. 4. No pathologic lymphadenopathy. Upper normal sized left axillary lymph nodes, the largest of which was previously biopsied no evidence of metastatic disease.   05/15/2017 Imaging   Bone scan whole body 05/15/17 IMPRESSION: No findings specific for metastatic disease to bone.   05/15/2017 Imaging   CT CAP W contrast 05/15/17  IMPRESSION: 1. 18 mm soft tissue lesion inferior left breast compatible with known primary malignancy. 2. Mildly enlarged left axillary lymph nodes in this patient with biopsy-proven metastatic disease to the left axilla. 3. No evidence for lymphadenopathy elsewhere in the chest. No lymphadenopathy in the abdomen or pelvis. No evidence for metastatic disease in the abdomen or pelvis. 4. Tiny right perifissural lung nodule most likely subpleural lymph node. Attention on follow-up recommended. 5. 7 mm hypoattenuating lesion in the spleen, likely a cyst or pseudocyst. Attention on follow-up recommended. 6. Uterine fibroids. 7.  Aortic Atherosclerois (ICD10-170.0)   05/19/2017 Procedure   Port placement by Dr. [name] on 9/11/8  05/22/2017 - 10/09/2017 Chemotherapy    neoadjuvant  chemotherpy AC every 2 weeks for 4 cycles starting 05/22/17 and completed on 07/07/17, followed by weekly paclitaxel for 12 cycles starting 07/24/17 to complete on 10/09/17    05/22/2017 Pathology Results   Diagnosis Breast, left, needle core biopsy, lower inner quad - DUCTAL CARCINOMA IN SITU. 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  The genes analyzed were the 46 genes on [facility]'s Common Cancers panel (APC, ATM, AXIN2, BARD1, BMPR1A, BRCA1, BRCA2, BRIP1, CDH1, CDKN2A, CHEK2, CTNNA1, DICER1, EPCAM, GREM1, HOXB13, KIT, MEN1, MLH1, MSH2, MSH3, MSH6, MUTYH, NBN, NF1, NTHL1, PALB2, PDGFRA, PMS2, POLD1, POLE, PTEN, RAD50, RAD51C, RAD51D, SDHA, SDHB, SDHC, SDHD, SMAD4, SMARCA4, STK11, TP53, TSC1, TSC2, VHL).     08/14/2017 Echocardiogram   EF 60-65%  Study Conclusions  - Left ventricle: The cavity size was normal. Systolic function was   normal. The estimated ejection fraction was in the range of 60%   to 65%. Wall motion was normal; there were no regional wall   motion abnormalities. Doppler parameters are consistent with   abnormal left ventricular relaxation (grade 1 diastolic   dysfunction). - Pulmonary arteries: PA peak pressure: 31 mm Hg (S).  Impressions:  - No change from September 2018 study (strain rate not performed on   that study).      10/14/2017 Imaging   MRI Breast Bilateral  IMPRESSION:  Significant improvement following neoadjuvant treatment.  No residual enhancement in the left breast at sites of known malignancy.  RECOMMENDATION: Treatment plan for known malignancy.   11/19/2017 Surgery   BILATERAL MASTECTOMIES WITH LEFT SENTINEL LYMPH NODE BIOPSY by Dr. [name] 11/19/17   11/19/2017 Pathology Results    Diagnosis 11/19/17 1. Breast, simple mastectomy, Right - FIBROCYSTIC AND FIBROADENOMATOID CHANGE. - NO MALIGNANCY IDENTIFIED. 2.  Breast, simple mastectomy, Left - NO RESIDUAL CARCINOMA IDENTIFIED. - BIOPSY SITES. - FIBROCYSTIC AND FIBROADENOMATOID CHANGE. - SEE ONCOLOGY TABLE. 3. Lymph node, sentinel, biopsy, Left Axillary #1 - ONE OF ONE LYMPH NODES NEGATIVE FOR CARCINOMA (0/1). - BIOPSY SITE. 4. Lymph node, sentinel, biopsy, Left Axillary # 2 - ONE OF ONE LYMPH NODES NEGATIVE FOR CARCINOMA (0/1).   12/29/2018 Imaging   CT CAP W Contrast  IMPRESSION: No evidence of recurrent or metastatic carcinoma. No other acute findings.   3.5 cm right uterine fibroid.   Colonic diverticulosis, without radiographic evidence of diverticulitis.        CURRENT THERAPY:  Surveillance  INTERVAL HISTORY:  Briana Price is here for a follow up of left breast cancer. She was last seen by me 6 months ago. She presents to the clinic alone. She notes she is doing well. She has not planned to do breast cancer recurrence yet and postponed due to COVID19 variant. She notes she is doing well with no new pain or change in bowel habits. She is eating well with adequate appetite. She notes her in 01/2020 her lisinopril was changed to Losartan, but does not remember dose. She is on iron every other day and no longer on Vit D due to her level bing too high. She notes only having spotting, no true period. She has been on oral iron given by her PCP. She notes she has right lower back pain that will radiate down her leg. Her other physician attributed this to arthritis. She wonders does she have to f/u on her lung  nodule. She notes only smoking for 1 week of her life.     REVIEW OF SYSTEMS:   Constitutional: Denies fevers, chills or abnormal weight loss Eyes: Denies blurriness of vision Ears, nose, mouth, throat, and face: Denies mucositis or sore throat Respiratory: Denies cough, dyspnea or wheezes Cardiovascular: Denies palpitation, chest discomfort or lower extremity swelling Gastrointestinal:  Denies nausea, heartburn or change in  bowel habits Skin: Denies abnormal skin rashes MSK: (+) Arthritis in back  Lymphatics: Denies new lymphadenopathy or easy bruising Neurological:Denies numbness, tingling or new weaknesses Behavioral/Psych: Mood is stable, no new changes  All other systems were reviewed with the patient and are negative.  MEDICAL HISTORY:  Past Medical History:  Diagnosis Date  . Anemia   . Arthritis   . Breast cancer ([place])   . Cancer [facility])    left breast cancer  . Family history of breast cancer   . Genetic testing 05/28/2017   Common Cancers panel (46 genes) @ [facility] - No pathogenic mutations detected  . GERD (gastroesophageal reflux disease)   . Headache    Migraines  . Hypertension   . Non-healing surgical wound    right chest wall    SURGICAL HISTORY: Past Surgical History:  Procedure Laterality Date  . BREAST SURGERY     biopsy  . CESAREAN SECTION    . CHOLECYSTECTOMY N/A 02/07/2013   Procedure: LAPAROSCOPIC CHOLECYSTECTOMY WITH INTRAOPERATIVE CHOLANGIOGRAM;  Surgeon: Gwenyth Ober, MD;  Location: [place];  Service: General;  Laterality: N/A;  . COLONOSCOPY W/ BIOPSIES AND POLYPECTOMY    . COLONOSCOPY W/ BIOPSIES AND POLYPECTOMY    . EXCISION OF BREAST LESION Right 06/08/2019   Procedure: EXCISION OF NON HEALING WOUND RIGHT CHEST;  Surgeon: [name], MD;  Location: [place];  Service: General;  Laterality: Right;  . FOOT SURGERY  2003   lt foot bunionectomy  . MASTECTOMY W/ SENTINEL NODE BIOPSY Bilateral 11/19/2017   Procedure: BILATERAL MASTECTOMIES WITH LEFT SENTINEL LYMPH NODE BIOPSY;  Surgeon: [name], MD;  Location: [place];  Service: General;  Laterality: Bilateral;  . MULTIPLE TOOTH EXTRACTIONS    . PORTACATH PLACEMENT Right 05/19/2017   Procedure: INSERTION PORT-A-CATH;  Surgeon: [name], MD;  Location: [place];  Service: General;  Laterality: Right;    I have reviewed the social history and family history with the patient and  they are unchanged from previous note.  ALLERGIES:  has No Known Allergies.  MEDICATIONS:  Current Outpatient Medications  Medication Sig Dispense Refill  . acetaminophen (TYLENOL) 500 MG tablet Take 1,000 mg by mouth every 8 (eight) hours as needed for moderate pain or headache.    . Ascorbic Acid (VITAMIN C) 1000 MG tablet Take 1,000 mg by mouth daily.    Marland Kitchen atorvastatin (LIPITOR) 20 MG tablet Take 20 mg by mouth daily.  1  . ferrous sulfate 325 (65 FE) MG tablet Take 325 mg by mouth every other day.     . hydrochlorothiazide (HYDRODIURIL) 25 MG tablet Take 25 mg by mouth daily.    Marland Kitchen ketoconazole (NIZORAL) 2 % shampoo Apply 1 application topically as directed.    . Multiple Vitamins-Minerals (MULTIVITAMIN ADULT PO) Take 1 tablet by mouth daily.    . SULFACLEANSE 8/4 8-4 % SUSP Apply 1 application topically daily. Wash face daily     No current facility-administered medications for this visit.   Facility-Administered Medications Ordered in Other Visits  Medication Dose Route Frequency Provider Last Rate Last Admin  .  heparin lock flush 100 unit/mL  500 Units Intravenous Once PRN [name], MD      . sodium chloride flush (NS) 0.9 % injection 10 mL  10 mL Intravenous PRN [name], MD   10 mL at 08/07/17 1354  . sodium chloride flush (NS) 0.9 % injection 10 mL  10 mL Intravenous PRN [name], MD        PHYSICAL EXAMINATION: ECOG PERFORMANCE STATUS: 0 - Asymptomatic  Vitals:   04/19/20 1422  BP: (!) 133/91  Pulse: 75  Resp: 20  Temp: (!) 97.3 F (36.3 C)  SpO2: 99%   Filed Weights   04/19/20 1422  Weight: 221 lb 6.4 oz (100.4 kg)    GENERAL:alert, no distress and comfortable SKIN: skin color, texture, turgor are normal, no rashes or significant lesions EYES: normal, Conjunctiva are pink and non-injected, sclera clear  NECK: supple, thyroid normal size, non-tender, without nodularity LYMPH:  no palpable lymphadenopathy in the cervical, axillary  LUNGS: clear to  auscultation and percussion with normal breathing effort HEART: regular rate & rhythm and no murmurs and no lower extremity edema ABDOMEN:abdomen soft, non-tender and normal bowel sounds Musculoskeletal:no cyanosis of digits and no clubbing  NEURO: alert & oriented x 3 with fluent speech, no focal motor/sensory deficits BREAST: S/p B/l mastectomy: Surgical incisions healed well. No palpable mass, nodules or adenopathy bilaterally. Breast exam benign.   LABORATORY DATA:  I have reviewed the data as listed CBC Latest Ref Rng & Units 04/19/2020 10/20/2019 06/01/2019  WBC 4.0 - 10.5 K/uL 8.5 7.8 7.2  Hemoglobin 12.0 - 15.0 g/dL 12.6 13.3 13.4  Hematocrit 36 - 46 % 37.3 39.4 40.6  Platelets 150 - 400 K/uL 323 349 374     CMP Latest Ref Rng & Units 04/19/2020 10/20/2019 06/01/2019  Glucose 70 - 99 mg/dL 110(H) 108(H) 146(H)  BUN 6 - 20 mg/dL 9 9 5(L)  Creatinine 0.44 - 1.00 mg/dL 0.72 0.75 0.76  Sodium 135 - 145 mmol/L 141 142 136  Potassium 3.5 - 5.1 mmol/L 3.4(L) 3.5 3.6  Chloride 98 - 111 mmol/L 102 102 97(L)  CO2 22 - 32 mmol/L _0 Calcium 8.9 - 10.3 mg/dL 10.0 9.7 9.7  Total Protein 6.5 - 8.1 g/dL 7.6 8.0 -  Total Bilirubin 0.3 - 1.2 mg/dL 0.8 0.9 -  Alkaline Phos 38 - 126 U/L 83 91 -  AST 15 - 41 U/L 23 28 -  ALT 0 - 44 U/L 25 33 -      RADIOGRAPHIC STUDIES: I have personally reviewed the radiological images as listed and agreed with the findings in the report. No results found.   ASSESSMENT & PLAN:  SARYE KATH is a 54 y.o. female with    1. 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 She is s/p neoadjuvant AC-T and b/l mastectomy.She has recovered well from treatment. -[facility] [place] and new variant. She will proceed when ready so her mother can be there with her.  -Briana Price  is still in place. Will remove withbreastreconstruction. Will continue port flush every6-8weeks -She had wound issue and she underwent excision on 06/08/19 and incisions have healed.  -She is clinically doing well. Lab reviewed, her CBC and CMP are within normal limits. Her physical exam was unremarkable. There is no clinical concern for recurrence. -She only has vaginal spotting, last  2-3 month ago. She has had no true period since starting prior chemo. Her last colonoscopy was good per pt. She is fine to stop her oral iron as over use can lead to iron overload. I will check iron level at next visit.  -She still has PAC in place, Plan to remove with Dr [name] on 05/18/20  -It has been 3 years since her cancer diagnosis. Her risk of recurrence has significantly decreased. Continue 5 years surveillance plan. She is s/p B/l mastectomy so she does not need mammograms.  -F/u in 6 months   2. Genetic Testing was negative for pathogenetic mutations with VUS of ATM.   3. HTN -Continuefollow-up with her primary care physician  -She is on HCTZ and losartan. Willcontinue  4. Lung nodules, Subcapsular spleen cyst -Her restaging CT scan showeda 5m subcapsular cyst andtiny right lung nodule, 1-2 mm, likely benign, follow-up recommended. She never smoked, low risk for lung cancer. -12/28/17 CT CAP showed stable cyst and nodule, no further surveillance scan is needed.  6. Cancer screenings  -She had a colonoscopy with Dr. MCollene Maresin the past 2 years -She is up to date on her Pap Smears, will continue   7. Low back pain  -She has had lumbar back pain before but has worsened recently with relief from sitting and leaning forward. This is Intermittent -She will continue to f/u with Orthopedic surgeon. This is likely arthritis.   8. Bone Heath  -With her last period before chemo and her last spotting 2-3 months ago, she is likely perimenopause.  -Once she is post-menopausal, will order base lien  DEXA -She can start oral calcium now. She note prior elevated Vit D level, so she is holding it for now.    PLAN: -Copy note to Dr BIval Bibleto have PAC removed  -Lab and f/u in 6 months    No problem-specific Assessment & Plan notes found for this encounter.   No orders of the defined types were placed in this encounter.  All questions were answered. The patient knows to call the clinic with any problems, questions or concerns. No barriers to learning was detected. The total time spent in the appointment was 25 minutes.     YTruitt Merle MD 04/19/2020   I, AJoslyn Devon am acting as scribe for YTruitt Merle MD.   I have reviewed the above documentation for accuracy and completeness, and I agree with the above.

## 2020-04-19 ENCOUNTER — Inpatient Hospital Stay: Payer: Federal, State, Local not specified - PPO

## 2020-04-19 ENCOUNTER — Other Ambulatory Visit: Payer: Self-pay

## 2020-04-19 ENCOUNTER — Inpatient Hospital Stay: Payer: Federal, State, Local not specified - PPO | Attending: Hematology | Admitting: Hematology

## 2020-04-19 VITALS — BP 133/91 | HR 75 | Temp 97.3°F | Resp 20 | Ht 66.0 in | Wt 221.4 lb

## 2020-04-19 DIAGNOSIS — M1990 Unspecified osteoarthritis, unspecified site: Secondary | ICD-10-CM | POA: Diagnosis not present

## 2020-04-19 DIAGNOSIS — Z853 Personal history of malignant neoplasm of breast: Secondary | ICD-10-CM | POA: Diagnosis not present

## 2020-04-19 DIAGNOSIS — M545 Low back pain: Secondary | ICD-10-CM | POA: Insufficient documentation

## 2020-04-19 DIAGNOSIS — Z9221 Personal history of antineoplastic chemotherapy: Secondary | ICD-10-CM | POA: Insufficient documentation

## 2020-04-19 DIAGNOSIS — D734 Cyst of spleen: Secondary | ICD-10-CM | POA: Insufficient documentation

## 2020-04-19 DIAGNOSIS — Z79899 Other long term (current) drug therapy: Secondary | ICD-10-CM | POA: Insufficient documentation

## 2020-04-19 DIAGNOSIS — Z9013 Acquired absence of bilateral breasts and nipples: Secondary | ICD-10-CM | POA: Diagnosis not present

## 2020-04-19 DIAGNOSIS — K219 Gastro-esophageal reflux disease without esophagitis: Secondary | ICD-10-CM | POA: Diagnosis not present

## 2020-04-19 DIAGNOSIS — I10 Essential (primary) hypertension: Secondary | ICD-10-CM | POA: Insufficient documentation

## 2020-04-19 DIAGNOSIS — C50512 Malignant neoplasm of lower-outer quadrant of left female breast: Secondary | ICD-10-CM

## 2020-04-19 DIAGNOSIS — Z452 Encounter for adjustment and management of vascular access device: Secondary | ICD-10-CM | POA: Insufficient documentation

## 2020-04-19 DIAGNOSIS — R918 Other nonspecific abnormal finding of lung field: Secondary | ICD-10-CM | POA: Insufficient documentation

## 2020-04-19 DIAGNOSIS — Z95828 Presence of other vascular implants and grafts: Secondary | ICD-10-CM

## 2020-04-19 DIAGNOSIS — Z171 Estrogen receptor negative status [ER-]: Secondary | ICD-10-CM | POA: Insufficient documentation

## 2020-04-19 LAB — CBC WITH DIFFERENTIAL/PLATELET
Abs Immature Granulocytes: 0.02 10*3/uL (ref 0.00–0.07)
Basophils Absolute: 0.1 10*3/uL (ref 0.0–0.1)
Basophils Relative: 1 %
Eosinophils Absolute: 0.1 10*3/uL (ref 0.0–0.5)
Eosinophils Relative: 1 %
HCT: 37.3 % (ref 36.0–46.0)
Hemoglobin: 12.6 g/dL (ref 12.0–15.0)
Immature Granulocytes: 0 %
Lymphocytes Relative: 24 %
Lymphs Abs: 2 10*3/uL (ref 0.7–4.0)
MCH: 29.2 pg (ref 26.0–34.0)
MCHC: 33.8 g/dL (ref 30.0–36.0)
MCV: 86.3 fL (ref 80.0–100.0)
Monocytes Absolute: 0.7 10*3/uL (ref 0.1–1.0)
Monocytes Relative: 8 %
Neutro Abs: 5.6 10*3/uL (ref 1.7–7.7)
Neutrophils Relative %: 66 %
Platelets: 323 10*3/uL (ref 150–400)
RBC: 4.32 MIL/uL (ref 3.87–5.11)
RDW: 12.6 % (ref 11.5–15.5)
WBC: 8.5 10*3/uL (ref 4.0–10.5)
nRBC: 0 % (ref 0.0–0.2)

## 2020-04-19 LAB — COMPREHENSIVE METABOLIC PANEL
ALT: 25 U/L (ref 0–44)
AST: 23 U/L (ref 15–41)
Albumin: 3.8 g/dL (ref 3.5–5.0)
Alkaline Phosphatase: 83 U/L (ref 38–126)
Anion gap: 9 (ref 5–15)
BUN: 9 mg/dL (ref 6–20)
CO2: 30 mmol/L (ref 22–32)
Calcium: 10 mg/dL (ref 8.9–10.3)
Chloride: 102 mmol/L (ref 98–111)
Creatinine, Ser: 0.72 mg/dL (ref 0.44–1.00)
GFR calc Af Amer: >60 mL/min (ref >60)
GFR calc non Af Amer: >60 mL/min (ref >60)
Glucose, Bld: 110 mg/dL — ABNORMAL HIGH (ref 70–99)
Potassium: 3.4 mmol/L — ABNORMAL LOW (ref 3.5–5.1)
Sodium: 141 mmol/L (ref 135–145)
Total Bilirubin: 0.8 mg/dL (ref 0.3–1.2)
Total Protein: 7.6 g/dL (ref 6.5–8.1)

## 2020-04-19 MED ORDER — SODIUM CHLORIDE 0.9% FLUSH
10.0000 mL | INTRAVENOUS | Status: DC | PRN
  Administered 2020-04-19: 10 mL via INTRAVENOUS
  Filled 2020-04-19: qty 10

## 2020-04-19 MED ORDER — HEPARIN SOD (PORK) LOCK FLUSH 100 UNIT/ML IV SOLN
500.0000 [IU] | Freq: Once | INTRAVENOUS | Status: AC | PRN
  Administered 2020-04-19: 500 [IU] via INTRAVENOUS
  Filled 2020-04-19: qty 5

## 2020-04-20 ENCOUNTER — Telehealth: Payer: Self-pay | Admitting: Hematology

## 2020-04-20 NOTE — Telephone Encounter (Signed)
Scheduled per 8/12 los. Pt is aware of appt time and date. 

## 2020-04-21 ENCOUNTER — Encounter: Payer: Self-pay | Admitting: Hematology

## 2020-05-18 DIAGNOSIS — Z452 Encounter for adjustment and management of vascular access device: Secondary | ICD-10-CM | POA: Diagnosis not present

## 2020-10-19 NOTE — Progress Notes (Signed)
[name]   [phone] [phone]   Clinic Follow up Note   Patient Care Team: [name], MD as PCP - General (Family Medicine) [name], MD as Consulting Physician (Hematology) [name], MD as Consulting Physician (General Surgery) [name], MD as Consulting Physician (Radiation Oncology) Gardenia Phlegm, NP as Nurse Practitioner (Hematology and Oncology)  Date of Service:  10/22/2020  CHIEF COMPLAINT: Follow up left breastcancer, triple negative  SUMMARY OF ONCOLOGIC HISTORY: Oncology History Overview Note  Cancer Staging Malignant neoplasm of lower-outer quadrant of left breast of female, estrogen receptor negative ([name]) Staging form: Breast, AJCC 8th Edition - Clinical: Stage IIB (cT2, cN0(f), cM0, G3, ER: Negative, PR: Negative, HER2: Negative) - Unsigned     Malignant neoplasm of lower-outer quadrant of left breast of female, estrogen receptor negative ([name])  04/27/2017 Mammogram   IMPRESSION: 1. Highly suspicious mass within the LEFT breast at the 5:30 o'clock axis, 4 cm from the nipple, measuring 2.5 cm, corresponding to the mammographic finding and corresponding to 1 of the palpable lumps in the left breast. Ultrasound-guided biopsy is recommended. 2. Mildly prominent lymph node in the LEFT axilla, with normal cortical thickness but with questionable effacement of the fatty hilum. Ultrasound-guided core biopsy is recommended. 3. No evidence of malignancy within the RIGHT breast. Also, no evidence of malignancy within the second area of clinical concern in the upper outer left breast.   04/29/2017 Initial Diagnosis   Malignant neoplasm of lower-outer quadrant of left breast of female, estrogen receptor negative ([name])   04/29/2017 Receptors her2   Estrogen Receptor: 0%, NEGATIVE Progesterone Receptor: 0%, NEGATIVE Proliferation Marker Ki67: 90% HER2 NEGATIVE    04/29/2017 Initial Biopsy    Diagnosis 1.  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 - SEE COMMENT. 2. Lymph node, needle/core biopsy, left axilla - THERE IN NO EVIDENCE OF CARCINOMA IN 1 OF 1 LYMPH NODE (0/1).   05/14/2017 Imaging   MRI of Breast Bilateral 05/14/17  IMPRESSION: 1. 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 2. Non mass enhancement extending approximately 3.2 cm anterior to the mass in the lower inner quadrant of the left breast, suspicious for DCIS. There is no correlate on recent mammography. The overall extent of the mass and non mass enhancement is approximately 5 cm. 3. No MRI evidence of malignancy involving the right breast. 4. No pathologic lymphadenopathy. Upper normal sized left axillary lymph nodes, the largest of which was previously biopsied no evidence of metastatic disease.   05/15/2017 Imaging   Bone scan whole body 05/15/17 IMPRESSION: No findings specific for metastatic disease to bone.   05/15/2017 Imaging   CT CAP W contrast 05/15/17  IMPRESSION: 1. 18 mm soft tissue lesion inferior left breast compatible with known primary malignancy. 2. Mildly enlarged left axillary lymph nodes in this patient with biopsy-proven metastatic disease to the left axilla. 3. No evidence for lymphadenopathy elsewhere in the chest. No lymphadenopathy in the abdomen or pelvis. No evidence for metastatic disease in the abdomen or pelvis. 4. Tiny right perifissural lung nodule most likely subpleural lymph node. Attention on follow-up recommended. 5. 7 mm hypoattenuating lesion in the spleen, likely a cyst or pseudocyst. Attention on follow-up recommended. 6. Uterine fibroids. 7.  Aortic Atherosclerois (ICD10-170.0)   05/19/2017 Procedure   Port placement by Dr. [name] on 9/11/8  05/22/2017 - 10/09/2017 Chemotherapy    neoadjuvant  chemotherpy AC every 2 weeks for 4 cycles starting 05/22/17 and completed on 07/07/17, followed by weekly paclitaxel for 12 cycles starting 07/24/17 to complete on 10/09/17    05/22/2017 Pathology Results   Diagnosis Breast, left, needle core biopsy, lower inner quad - DUCTAL CARCINOMA IN SITU. 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  The genes analyzed were the 46 genes on [facility]'s Common Cancers panel (APC, ATM, AXIN2, BARD1, BMPR1A, BRCA1, BRCA2, BRIP1, CDH1, CDKN2A, CHEK2, CTNNA1, DICER1, EPCAM, GREM1, HOXB13, KIT, MEN1, MLH1, MSH2, MSH3, MSH6, MUTYH, NBN, NF1, NTHL1, PALB2, PDGFRA, PMS2, POLD1, POLE, PTEN, RAD50, RAD51C, RAD51D, SDHA, SDHB, SDHC, SDHD, SMAD4, SMARCA4, STK11, TP53, TSC1, TSC2, VHL).     08/14/2017 Echocardiogram   EF 60-65%  Study Conclusions  - Left ventricle: The cavity size was normal. Systolic function was   normal. The estimated ejection fraction was in the range of 60%   to 65%. Wall motion was normal; there were no regional wall   motion abnormalities. Doppler parameters are consistent with   abnormal left ventricular relaxation (grade 1 diastolic   dysfunction). - Pulmonary arteries: PA peak pressure: 31 mm Hg (S).  Impressions:  - No change from September 2018 study (strain rate not performed on   that study).      10/14/2017 Imaging   MRI Breast Bilateral  IMPRESSION:  Significant improvement following neoadjuvant treatment.  No residual enhancement in the left breast at sites of known malignancy.  RECOMMENDATION: Treatment plan for known malignancy.   11/19/2017 Surgery   BILATERAL MASTECTOMIES WITH LEFT SENTINEL LYMPH NODE BIOPSY by Dr. [name] 11/19/17   11/19/2017 Pathology Results    Diagnosis 11/19/17 1. Breast, simple mastectomy, Right - FIBROCYSTIC AND FIBROADENOMATOID CHANGE. - NO MALIGNANCY IDENTIFIED. 2.  Breast, simple mastectomy, Left - NO RESIDUAL CARCINOMA IDENTIFIED. - BIOPSY SITES. - FIBROCYSTIC AND FIBROADENOMATOID CHANGE. - SEE ONCOLOGY TABLE. 3. Lymph node, sentinel, biopsy, Left Axillary #1 - ONE OF ONE LYMPH NODES NEGATIVE FOR CARCINOMA (0/1). - BIOPSY SITE. 4. Lymph node, sentinel, biopsy, Left Axillary # 2 - ONE OF ONE LYMPH NODES NEGATIVE FOR CARCINOMA (0/1).   12/29/2018 Imaging   CT CAP W Contrast  IMPRESSION: No evidence of recurrent or metastatic carcinoma. No other acute findings.   3.5 cm right uterine fibroid.   Colonic diverticulosis, without radiographic evidence of diverticulitis.        CURRENT THERAPY:  Surveillance  INTERVAL HISTORY:  DYNEISHA MURCHISON is here for a follow up of left breast cancer. She was last seen by me 6 months ago. She presents to the clinic alone. She notes she is doing well. She notes she has not scheduled her breast reconstruction surgery yet due to COVID but she is interested in proceeding. She denies chest wall pain. She notes having itching of her skin over her surgical incision. She notes joint stiff ness and pain in her knees. She notes she still has intermittent low back pain that is stable. She notes she spotted 3 times since she completed chemo. She has not had true period.     REVIEW OF SYSTEMS:   Constitutional: Denies fevers, chills or abnormal weight loss Eyes: Denies blurriness of vision Ears, nose, mouth, throat, and face: Denies mucositis or sore throat Respiratory: Denies cough, dyspnea or wheezes Cardiovascular: Denies palpitation, chest discomfort or lower extremity swelling Gastrointestinal:  Denies nausea, heartburn or change in bowel habits Skin: Denies abnormal skin rashes MSK: (+) joint pain in knees (+) Low back pain  Lymphatics: Denies new lymphadenopathy or easy bruising Neurological:Denies numbness, tingling or new weaknesses Behavioral/Psych: Mood is stable, no new changes  All other systems  were reviewed with the patient and are negative.  MEDICAL HISTORY:  Past Medical History:  Diagnosis Date  . Anemia   . Arthritis   . Breast cancer ([place])   . Cancer [facility])    left breast cancer  . Family history of breast cancer   . Genetic testing 05/28/2017   Common Cancers panel (46 genes) @ [facility] - No pathogenic mutations detected  . GERD (gastroesophageal reflux disease)   . Headache    Migraines  . Hypertension   . Non-healing surgical wound    right chest wall    SURGICAL HISTORY: Past Surgical History:  Procedure Laterality Date  . BREAST SURGERY     biopsy  . CESAREAN SECTION    . CHOLECYSTECTOMY N/A 02/07/2013   Procedure: LAPAROSCOPIC CHOLECYSTECTOMY WITH INTRAOPERATIVE CHOLANGIOGRAM;  Surgeon: Gwenyth Ober, MD;  Location: [place];  Service: General;  Laterality: N/A;  . COLONOSCOPY W/ BIOPSIES AND POLYPECTOMY    . COLONOSCOPY W/ BIOPSIES AND POLYPECTOMY    . EXCISION OF BREAST LESION Right 06/08/2019   Procedure: EXCISION OF NON HEALING WOUND RIGHT CHEST;  Surgeon: [name], MD;  Location: [place];  Service: General;  Laterality: Right;  . FOOT SURGERY  2003   lt foot bunionectomy  . MASTECTOMY W/ SENTINEL NODE BIOPSY Bilateral 11/19/2017   Procedure: BILATERAL MASTECTOMIES WITH LEFT SENTINEL LYMPH NODE BIOPSY;  Surgeon: [name], MD;  Location: [place];  Service: General;  Laterality: Bilateral;  . MULTIPLE TOOTH EXTRACTIONS    . PORTACATH PLACEMENT Right 05/19/2017   Procedure: INSERTION PORT-A-CATH;  Surgeon: [name], MD;  Location: [place];  Service: General;  Laterality: Right;    I have reviewed the social history and family history with the patient and they are unchanged from previous note.  ALLERGIES:  has No Known Allergies.  MEDICATIONS:  Current Outpatient Medications  Medication Sig Dispense Refill  . acetaminophen (TYLENOL) 500 MG tablet Take 1,000 mg by mouth every 8 (eight) hours as needed for  moderate pain or headache.    . Ascorbic Acid (VITAMIN C) 1000 MG tablet Take 1,000 mg by mouth daily.    Marland Kitchen atorvastatin (LIPITOR) 20 MG tablet Take 20 mg by mouth daily.  1  . hydrochlorothiazide (HYDRODIURIL) 25 MG tablet Take 25 mg by mouth daily.    Marland Kitchen ketoconazole (NIZORAL) 2 % shampoo Apply 1 application topically as directed.    Marland Kitchen losartan (COZAAR) 50 MG tablet Take 50 mg by mouth daily.    . Multiple Vitamins-Minerals (MULTIVITAMIN ADULT PO) Take 1 tablet by mouth daily.    . SULFACLEANSE 8/4 8-4 % SUSP Apply 1 application topically daily. Wash face daily     No current facility-administered medications for this visit.   Facility-Administered Medications Ordered in Other Visits  Medication Dose Route Frequency Provider Last Rate Last Admin  . heparin lock flush 100 unit/mL  500 Units Intravenous Once PRN [name], MD      . sodium chloride flush (NS) 0.9 % injection 10 mL  10 mL Intravenous PRN [name], MD   10 mL at 08/07/17 1354  . sodium chloride flush (NS) 0.9 % injection 10 mL  10 mL Intravenous  PRN [name], MD        PHYSICAL EXAMINATION: ECOG PERFORMANCE STATUS: 0 - Asymptomatic  Vitals:   10/22/20 1338  BP: (!) 140/97  Pulse: 75  Resp: 18  Temp: 97.6 F (36.4 C)  SpO2: 100%   Filed Weights   10/22/20 1338  Weight: 211 lb 4.8 oz (95.8 kg)    GENERAL:alert, no distress and comfortable SKIN: skin color, texture, turgor are normal, no rashes or significant lesions EYES: normal, Conjunctiva are pink and non-injected, sclera clear  NECK: supple, thyroid normal size, non-tender, without nodularity LYMPH:  no palpable lymphadenopathy in the cervical, axillary  LUNGS: clear to auscultation and percussion with normal breathing effort HEART: regular rate & rhythm and no murmurs and no lower extremity edema ABDOMEN:abdomen soft, non-tender and normal bowel sounds Musculoskeletal:no cyanosis of digits and no clubbing  NEURO: alert & oriented x 3 with fluent speech,  no focal motor/sensory deficits BREAST: S/p b/l mastectomy: Surgical incision healed well with scar tissue (+) 2.5cm movable soft tissue nodule in right axilla.   LABORATORY DATA:  I have reviewed the data as listed CBC Latest Ref Rng & Units 10/22/2020 04/19/2020 10/20/2019  WBC 4.0 - 10.5 K/uL 7.7 8.5 7.8  Hemoglobin 12.0 - 15.0 g/dL 13.1 12.6 13.3  Hematocrit 36.0 - 46.0 % 38.4 37.3 39.4  Platelets 150 - 400 K/uL 314 323 349     CMP Latest Ref Rng & Units 10/22/2020 04/19/2020 10/20/2019  Glucose 70 - 99 mg/dL 144(H) 110(H) 108(H)  BUN 6 - 20 mg/dL _0 Creatinine 0.44 - 1.00 mg/dL 0.69 0.72 0.75  Sodium 135 - 145 mmol/L 139 141 142  Potassium 3.5 - 5.1 mmol/L 2.9(L) 3.4(L) 3.5  Chloride 98 - 111 mmol/L 104 102 102  CO2 22 - 32 mmol/L _1 Calcium 8.9 - 10.3 mg/dL 9.3 10.0 9.7  Total Protein 6.5 - 8.1 g/dL 7.6 7.6 8.0  Total Bilirubin 0.3 - 1.2 mg/dL 0.8 0.8 0.9  Alkaline Phos 38 - 126 U/L 82 83 91  AST 15 - 41 U/L _2 ALT 0 - 44 U/L 23 25 33      RADIOGRAPHIC STUDIES: I have personally reviewed the radiological images as listed and agreed with the findings in the report. No results found.   ASSESSMENT & PLAN:  ANAIAH MCMANNIS is a 55 y.o. female with   1. 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 She is s/p neoadjuvant AC-T and b/l mastectomy.She has recovered well from treatment. -[facility] [place] and new variant. She will proceed when ready so her mother can be there with her.  -She is clinically doing well. Lab reviewed, her CBC and CMP are within normal limits except K 2.9, BG 144. Her physical exam shows r a ight axilla soft tissue nodule. I suspect this is benign, probably related to previous surgery, but I recommend [place] for further evaluation. She is agreeable.   -She is 3.5 years since her cancer diagnosis. Her risk of recurrence has significantly decreased. Continue 5 years surveillance plan. She is s/p B/l mastectomy so she does not need mammograms.  -F/u in 6 months   2. Genetic Testing was negative for pathogenetic mutations with VUS of ATM.   3. HTN -Continuefollow-up with her primary care physician  -She is on HCTZ and losartan. Willcontinue  4. Known benign Lung nodules, Subcapsular spleen cystseen on 12/2017 CT scan.   5. Cancer screenings  -She had a colonoscopy with Dr. Collene Mares in the past 2 years -She is up to date on her Pap Smears, will continue  6.Low back pain -She has had lumbar back pain before but has worsened recently with relief from sitting and leaning forward. This is Intermittent -She will continue to f/u with Orthopedic surgeon. This is likely arthritis. Stable.   7. Bone Heath  -With her last period before chemo and her last spotting 2-3 months ago, she is likely perimenopause.  -Once she is post-menopausal, will order base lien DEXA -She can start oral calcium now. She note prior elevated Vit D level, so she is holding it for now.    PLAN: -[place] of right axilla in 2-3 weeks to evaluate the palpable nodule  -Lab and f/u in 6 months   No problem-specific Assessment & Plan notes found for this encounter.   Orders Placed This Encounter  Procedures  . US BREAST LTD UNI RIGHT INC AXILLA    Standing Status:   Future    Standing Expiration Date:   10/22/2021    Order Specific Question:   Reason for Exam (SYMPTOM  OR DIAGNOSIS REQUIRED)    Answer:   right axillary nodule, abut 2-3 cm, rule out cancer recurrence    Order Specific Question:   Preferred imaging location?    Answer:   [facility]   All questions were answered. The patient knows to call the clinic with any problems, questions or concerns. No barriers to learning was detected. The total time spent in the appointment was 30 minutes.      [name], MD 10/22/2020   I, Joslyn Devon, am acting as scribe for [name], MD.   I have reviewed the above documentation for accuracy and completeness, and I agree with the above.

## 2020-10-22 ENCOUNTER — Inpatient Hospital Stay: Payer: Federal, State, Local not specified - PPO

## 2020-10-22 ENCOUNTER — Other Ambulatory Visit: Payer: Self-pay

## 2020-10-22 ENCOUNTER — Inpatient Hospital Stay: Payer: Federal, State, Local not specified - PPO | Attending: Hematology | Admitting: Hematology

## 2020-10-22 ENCOUNTER — Encounter: Payer: Self-pay | Admitting: Hematology

## 2020-10-22 VITALS — BP 140/97 | HR 75 | Temp 97.6°F | Resp 18 | Ht 66.0 in | Wt 211.3 lb

## 2020-10-22 DIAGNOSIS — R222 Localized swelling, mass and lump, trunk: Secondary | ICD-10-CM | POA: Diagnosis not present

## 2020-10-22 DIAGNOSIS — C50512 Malignant neoplasm of lower-outer quadrant of left female breast: Secondary | ICD-10-CM | POA: Insufficient documentation

## 2020-10-22 DIAGNOSIS — Z9013 Acquired absence of bilateral breasts and nipples: Secondary | ICD-10-CM | POA: Insufficient documentation

## 2020-10-22 DIAGNOSIS — Z9221 Personal history of antineoplastic chemotherapy: Secondary | ICD-10-CM | POA: Insufficient documentation

## 2020-10-22 DIAGNOSIS — Z171 Estrogen receptor negative status [ER-]: Secondary | ICD-10-CM | POA: Diagnosis not present

## 2020-10-22 DIAGNOSIS — Z79899 Other long term (current) drug therapy: Secondary | ICD-10-CM | POA: Diagnosis not present

## 2020-10-22 DIAGNOSIS — M545 Low back pain, unspecified: Secondary | ICD-10-CM | POA: Insufficient documentation

## 2020-10-22 DIAGNOSIS — I10 Essential (primary) hypertension: Secondary | ICD-10-CM | POA: Diagnosis not present

## 2020-10-22 LAB — COMPREHENSIVE METABOLIC PANEL
ALT: 23 U/L (ref 0–44)
AST: 22 U/L (ref 15–41)
Albumin: 4 g/dL (ref 3.5–5.0)
Alkaline Phosphatase: 82 U/L (ref 38–126)
Anion gap: 9 (ref 5–15)
BUN: 7 mg/dL (ref 6–20)
CO2: 26 mmol/L (ref 22–32)
Calcium: 9.3 mg/dL (ref 8.9–10.3)
Chloride: 104 mmol/L (ref 98–111)
Creatinine, Ser: 0.69 mg/dL (ref 0.44–1.00)
GFR, Estimated: >60 mL/min (ref >60)
Glucose, Bld: 144 mg/dL — ABNORMAL HIGH (ref 70–99)
Potassium: 2.9 mmol/L — ABNORMAL LOW (ref 3.5–5.1)
Sodium: 139 mmol/L (ref 135–145)
Total Bilirubin: 0.8 mg/dL (ref 0.3–1.2)
Total Protein: 7.6 g/dL (ref 6.5–8.1)

## 2020-10-22 LAB — CBC WITH DIFFERENTIAL/PLATELET
Abs Immature Granulocytes: 0.02 10*3/uL (ref 0.00–0.07)
Basophils Absolute: 0.1 10*3/uL (ref 0.0–0.1)
Basophils Relative: 1 %
Eosinophils Absolute: 0.2 10*3/uL (ref 0.0–0.5)
Eosinophils Relative: 2 %
HCT: 38.4 % (ref 36.0–46.0)
Hemoglobin: 13.1 g/dL (ref 12.0–15.0)
Immature Granulocytes: 0 %
Lymphocytes Relative: 26 %
Lymphs Abs: 2 10*3/uL (ref 0.7–4.0)
MCH: 29.4 pg (ref 26.0–34.0)
MCHC: 34.1 g/dL (ref 30.0–36.0)
MCV: 86.3 fL (ref 80.0–100.0)
Monocytes Absolute: 0.4 10*3/uL (ref 0.1–1.0)
Monocytes Relative: 6 %
Neutro Abs: 5 10*3/uL (ref 1.7–7.7)
Neutrophils Relative %: 65 %
Platelets: 314 10*3/uL (ref 150–400)
RBC: 4.45 MIL/uL (ref 3.87–5.11)
RDW: 12.6 % (ref 11.5–15.5)
WBC: 7.7 10*3/uL (ref 4.0–10.5)
nRBC: 0 % (ref 0.0–0.2)

## 2020-10-23 ENCOUNTER — Telehealth: Payer: Self-pay | Admitting: Hematology

## 2020-10-23 NOTE — Telephone Encounter (Signed)
Scheduled follow-up appointment per 2/14 los. Patient is aware. °

## 2020-10-25 ENCOUNTER — Other Ambulatory Visit: Payer: Self-pay | Admitting: Hematology

## 2020-10-25 ENCOUNTER — Telehealth: Payer: Self-pay

## 2020-10-25 ENCOUNTER — Other Ambulatory Visit: Payer: Self-pay

## 2020-10-25 MED ORDER — POTASSIUM CHLORIDE CRYS ER 20 MEQ PO TBCR: 20.0000 meq | EXTENDED_RELEASE_TABLET | Freq: Every day | ORAL | 1 refills | Status: DC

## 2020-10-25 NOTE — Progress Notes (Signed)
Potassium 20 meq take 1 tab Bid x 3 days then 1 tab QD follow up with PCP for HypoKalemia

## 2020-10-25 NOTE — Telephone Encounter (Signed)
-----   Message from [name], MD sent at 10/25/2020  8:07 AM EST ----- Please let pt know her K is very low, probably related to HCTZ. Please call in KCL 31meq daily for a month, take bid for 3 days fist, and f/u with PCP, thanks   [name]

## 2020-10-25 NOTE — Telephone Encounter (Signed)
Spoke with pt reviewed most recent lab results made aware of Dr. Burr Medico instructions for potassium and placed order to [facility] on [address]

## 2020-10-26 ENCOUNTER — Other Ambulatory Visit: Payer: Self-pay

## 2020-10-26 NOTE — Progress Notes (Signed)
Chart review.

## 2020-10-30 ENCOUNTER — Other Ambulatory Visit: Payer: Self-pay | Admitting: Hematology

## 2020-10-30 ENCOUNTER — Ambulatory Visit
Admission: RE | Admit: 2020-10-30 | Discharge: 2020-10-30 | Disposition: A | Discharge status: Home / Self Care | Payer: Federal, State, Local not specified - PPO | Source: Ambulatory Visit | Attending: Hematology | Admitting: Hematology

## 2020-10-30 ENCOUNTER — Other Ambulatory Visit: Payer: Self-pay

## 2020-10-30 DIAGNOSIS — C50512 Malignant neoplasm of lower-outer quadrant of left female breast: Secondary | ICD-10-CM

## 2020-10-30 DIAGNOSIS — N6489 Other specified disorders of breast: Secondary | ICD-10-CM | POA: Diagnosis not present

## 2020-10-30 DIAGNOSIS — Z171 Estrogen receptor negative status [ER-]: Secondary | ICD-10-CM

## 2020-11-05 DIAGNOSIS — R634 Abnormal weight loss: Secondary | ICD-10-CM | POA: Diagnosis not present

## 2020-11-05 DIAGNOSIS — E559 Vitamin D deficiency, unspecified: Secondary | ICD-10-CM | POA: Diagnosis not present

## 2020-11-05 DIAGNOSIS — E782 Mixed hyperlipidemia: Secondary | ICD-10-CM | POA: Diagnosis not present

## 2020-11-05 DIAGNOSIS — I10 Essential (primary) hypertension: Secondary | ICD-10-CM | POA: Diagnosis not present

## 2021-01-07 DIAGNOSIS — F418 Other specified anxiety disorders: Secondary | ICD-10-CM | POA: Diagnosis not present

## 2021-01-07 DIAGNOSIS — I10 Essential (primary) hypertension: Secondary | ICD-10-CM | POA: Diagnosis not present

## 2021-01-17 DIAGNOSIS — Z01419 Encounter for gynecological examination (general) (routine) without abnormal findings: Secondary | ICD-10-CM | POA: Diagnosis not present

## 2021-01-17 DIAGNOSIS — Z9013 Acquired absence of bilateral breasts and nipples: Secondary | ICD-10-CM | POA: Diagnosis not present

## 2021-01-17 DIAGNOSIS — I10 Essential (primary) hypertension: Secondary | ICD-10-CM | POA: Diagnosis not present

## 2021-01-17 DIAGNOSIS — B372 Candidiasis of skin and nail: Secondary | ICD-10-CM | POA: Diagnosis not present

## 2021-01-28 ENCOUNTER — Other Ambulatory Visit: Payer: Self-pay

## 2021-01-28 ENCOUNTER — Ambulatory Visit (INDEPENDENT_AMBULATORY_CARE_PROVIDER_SITE_OTHER): Payer: Federal, State, Local not specified - PPO

## 2021-01-28 ENCOUNTER — Encounter: Payer: Self-pay | Admitting: Podiatry

## 2021-01-28 ENCOUNTER — Ambulatory Visit (INDEPENDENT_AMBULATORY_CARE_PROVIDER_SITE_OTHER): Payer: Federal, State, Local not specified - PPO | Admitting: Podiatry

## 2021-01-28 DIAGNOSIS — M2061 Acquired deformities of toe(s), unspecified, right foot: Secondary | ICD-10-CM

## 2021-01-28 DIAGNOSIS — M67479 Ganglion, unspecified ankle and foot: Secondary | ICD-10-CM

## 2021-01-28 DIAGNOSIS — E782 Mixed hyperlipidemia: Secondary | ICD-10-CM | POA: Insufficient documentation

## 2021-01-28 DIAGNOSIS — M67472 Ganglion, left ankle and foot: Secondary | ICD-10-CM

## 2021-01-28 DIAGNOSIS — M79604 Pain in right leg: Secondary | ICD-10-CM | POA: Insufficient documentation

## 2021-01-28 DIAGNOSIS — M79672 Pain in left foot: Secondary | ICD-10-CM | POA: Diagnosis not present

## 2021-01-28 DIAGNOSIS — M545 Low back pain, unspecified: Secondary | ICD-10-CM | POA: Insufficient documentation

## 2021-01-28 DIAGNOSIS — Z860100 Personal history of colon polyps, unspecified: Secondary | ICD-10-CM | POA: Insufficient documentation

## 2021-01-28 DIAGNOSIS — F430 Acute stress reaction: Secondary | ICD-10-CM | POA: Insufficient documentation

## 2021-01-28 DIAGNOSIS — E66811 Obesity, class 1: Secondary | ICD-10-CM | POA: Insufficient documentation

## 2021-01-28 DIAGNOSIS — F419 Anxiety disorder, unspecified: Secondary | ICD-10-CM | POA: Insufficient documentation

## 2021-01-28 DIAGNOSIS — D509 Iron deficiency anemia, unspecified: Secondary | ICD-10-CM | POA: Insufficient documentation

## 2021-01-28 DIAGNOSIS — E559 Vitamin D deficiency, unspecified: Secondary | ICD-10-CM | POA: Insufficient documentation

## 2021-01-28 DIAGNOSIS — Z6834 Body mass index (BMI) 34.0-34.9, adult: Secondary | ICD-10-CM | POA: Insufficient documentation

## 2021-01-28 MED ORDER — TRIAMCINOLONE ACETONIDE 10 MG/ML IJ SUSP
10.0000 mg | Freq: Once | INTRAMUSCULAR | Status: AC
  Administered 2021-01-28: 10 mg

## 2021-01-28 NOTE — Patient Instructions (Signed)
https://www.foothealthfacts.org/conditions/ganglion-cyst"> https://www.clinicalkey.com">  Ganglion Cyst  A ganglion cyst is a non-cancerous, fluid-filled lump of tissue that occurs near a joint, tendon, or ligament. The cyst grows out of a joint or the lining of a tendon or ligament. Ganglion cysts most often develop in the hand or wrist, but they can also develop in the shoulder, elbow, hip, knee, ankle, or foot. Ganglion cysts are ball-shaped or egg-shaped. Their size can range from the size of a pea to larger than a grape. Increased activity may cause the cyst to get bigger because more fluid starts to build up. What are the causes? The exact cause of this condition is not known, but it may be related to:  Inflammation or irritation around the joint.  An injury or tear in the layers of tissue around the joint (joint capsule).  Repetitive movements or overuse.  History of acute or repeated injury. What increases the risk? You are more likely to develop this condition if:  You are a female.  You are 20-40 years old. What are the signs or symptoms? The main symptom of this condition is a lump. It most often appears on the hand or wrist. In many cases, there are no other symptoms, but a cyst can sometimes cause:  Tingling.  Pain or tenderness.  Numbness.  Weakness or loss of strength in the affected joint.  Decreased range of motion in the affected area of the body.   How is this diagnosed? Ganglion cysts are usually diagnosed based on a physical exam. Your health care provider will feel the lump and may shine a light next to it. If it is a ganglion cyst, the light will likely shine through it. Your health care provider may order an X-ray, ultrasound, MRI, or CT scan to rule out other conditions. How is this treated? Ganglion cysts often go away on their own without treatment. If you have pain or other symptoms, treatment may be needed. Treatment is also needed if the ganglion  cyst limits your movement or if it gets infected. Treatment may include:  Wearing a brace or splint on your wrist or finger.  Taking anti-inflammatory medicine.  Having fluid drained from the lump with a needle (aspiration).  Getting an injection of medicine into the joint to decrease inflammation. This may be corticosteroids, ethanol, or hyaluronidase.  Having surgery to remove the ganglion cyst.  Placing a pad in your shoe or wearing shoes that will not rub against the cyst if it is on your foot. Follow these instructions at home:  Do not press on the ganglion cyst, poke it with a needle, or hit it.  Take over-the-counter and prescription medicines only as told by your health care provider.  If you have a brace or splint: ? Wear it as told by your health care provider. ? Remove it as told by your health care provider. Ask if you need to remove it when you take a shower or a bath.  Watch your ganglion cyst for any changes.  Keep all follow-up visits as told by your health care provider. This is important. Contact a health care provider if:  Your ganglion cyst becomes larger or more painful.  You have pus coming from the lump.  You have weakness or numbness in the affected area.  You have a fever or chills. Get help right away if:  You have a fever and have any of these in the cyst area: ? Increased redness. ? Red streaks. ? Swelling. Summary  A   ganglion cyst is a non-cancerous, fluid-filled lump that occurs near a joint, tendon, or ligament.  Ganglion cysts most often develop in the hand or wrist, but they can also develop in the shoulder, elbow, hip, knee, ankle, or foot.  Ganglion cysts often go away on their own without treatment. This information is not intended to replace advice given to you by your health care provider. Make sure you discuss any questions you have with your health care provider. Document Revised: 11/16/2019 Document Reviewed:  11/16/2019 Elsevier Patient Education  2021 [employer]  

## 2021-01-29 LAB — NON-GYN, SPECIMEN A

## 2021-01-29 LAB — CYTOLOGY - NON PAP

## 2021-02-03 NOTE — Progress Notes (Signed)
Subjective:   Patient ID: Briana Price, female   DOB: 55 y.o.   MRN: [id number]   HPI 55 year old female presents the office today for concerns of a possible cyst on the top of her left foot which is been on about 3 months.  She states that intermittent in size.  No recent treatment.  Area is tender at times.  Also she had some pain last week on the bunion area of her right foot.  The pain is resolved.  No recent injury.  No other concerns.   Review of Systems  All other systems reviewed and are negative.  Past Medical History:  Diagnosis Date  . Anemia   . Arthritis   . Breast cancer ([place])   . Cancer [facility])    left breast cancer  . Family history of breast cancer   . Genetic testing 05/28/2017   Common Cancers panel (46 genes) @ [facility] - No pathogenic mutations detected  . GERD (gastroesophageal reflux disease)   . Headache    Migraines  . Hypertension   . Non-healing surgical wound    right chest wall    Past Surgical History:  Procedure Laterality Date  . BREAST SURGERY     biopsy  . CESAREAN SECTION    . CHOLECYSTECTOMY N/A 02/07/2013   Procedure: LAPAROSCOPIC CHOLECYSTECTOMY WITH INTRAOPERATIVE CHOLANGIOGRAM;  Surgeon: Gwenyth Ober, MD;  Location: [place];  Service: General;  Laterality: N/A;  . COLONOSCOPY W/ BIOPSIES AND POLYPECTOMY    . COLONOSCOPY W/ BIOPSIES AND POLYPECTOMY    . EXCISION OF BREAST LESION Right 06/08/2019   Procedure: EXCISION OF NON HEALING WOUND RIGHT CHEST;  Surgeon: [name], MD;  Location: [place];  Service: General;  Laterality: Right;  . FOOT SURGERY  2003   lt foot bunionectomy  . MASTECTOMY W/ SENTINEL NODE BIOPSY Bilateral 11/19/2017   Procedure: BILATERAL MASTECTOMIES WITH LEFT SENTINEL LYMPH NODE BIOPSY;  Surgeon: [name], MD;  Location: [place];  Service: General;  Laterality: Bilateral;  . MULTIPLE TOOTH EXTRACTIONS    . PORTACATH PLACEMENT Right 05/19/2017   Procedure: INSERTION  PORT-A-CATH;  Surgeon: [name], MD;  Location: [place];  Service: General;  Laterality: Right;     Current Outpatient Medications:  .  acetaminophen (TYLENOL) 500 MG tablet, Take 1,000 mg by mouth every 8 (eight) hours as needed for moderate pain or headache., Disp: , Rfl:  .  Ascorbic Acid (VITAMIN C) 1000 MG tablet, Take 1,000 mg by mouth daily., Disp: , Rfl:  .  atorvastatin (LIPITOR) 20 MG tablet, Take 1 tablet by mouth daily., Disp: , Rfl:  .  hydrochlorothiazide (HYDRODIURIL) 25 MG tablet, Take 1 tablet by mouth daily., Disp: , Rfl:  .  losartan (COZAAR) 50 MG tablet, Take 50 mg by mouth daily., Disp: , Rfl:  .  Multiple Vitamins-Minerals (MULTIVITAMIN ADULT PO), Take 1 tablet by mouth daily., Disp: , Rfl:  .  nystatin-triamcinolone (MYCOLOG II) cream, Apply topically 2 (two) times daily., Disp: , Rfl:  No current facility-administered medications for this visit.  Facility-Administered Medications Ordered in Other Visits:  .  heparin lock flush 100 unit/mL, 500 Units, Intravenous, Once PRN, [name], MD .  sodium chloride flush (NS) 0.9 % injection 10 mL, 10 mL, Intravenous, PRN, [name], MD, 10 mL at 08/07/17 1354 .  sodium chloride flush (NS) 0.9 % injection 10 mL, 10 mL, Intravenous, PRN, [name], MD  No Known Allergies  Objective:  Physical Exam  General: AAO x3, NAD  Dermatological: Skin is warm, dry and supple bilateral.  There are no open sores, no preulcerative lesions, no rash or signs of infection present.  Vascular: Dorsalis Pedis artery and Posterior Tibial artery pedal pulses are 2/4 bilateral with immedate capillary fill time.  There is no pain with calf compression, swelling, warmth, erythema.   Neruologic: Grossly intact via light touch bilateral.   Musculoskeletal: On the right foot at the time there is no area of tenderness.  No pain or crepitation with MPJ range of motion.  No edema, erythema.  On the left foot dorsal aspect is a fluid-filled  soft tissue mass consistent with ganglion cyst.  No other lesions are identified.  Muscular strength 5/5 in all groups tested bilateral.  Gait: Unassisted, Nonantalgic.       Assessment:   Likely ganglion cyst left foot, resolved capsulitis right foot    Plan:  -Treatment options discussed including all alternatives, risks, and complications -Etiology of symptoms were discussed -X-rays were obtained and reviewed with the patient.  There is no evidence of acute fracture, stress fracture or calcifications.  1.  Ganglion cyst, cyst left foot -Discussed aspiration, steroid injection.  Discussed conservative as well as surgical options.  This time subsequently proceed with aspiration.  Skin was prepped with alcohol and mixture of 3 cc lidocaine, Marcaine plain was infiltrated in remote fashion.  The skin was then prepped with Betadine.  18-gauge needle was utilized to aspirate clear, childlike fluid consistent with ganglion cyst.  Half cc Marcaine plain, half cc Kenalog 10 was infiltrated into the area.  Band-Aid followed by compression wrap was applied.  She tolerated the procedure well.  Postinjection care discussed.  2.  Right foot pain, resolved -Monitor any reoccurrence.  Trula Slade DPM

## 2021-02-14 DIAGNOSIS — I10 Essential (primary) hypertension: Secondary | ICD-10-CM | POA: Diagnosis not present

## 2021-03-04 DIAGNOSIS — M67912 Unspecified disorder of synovium and tendon, left shoulder: Secondary | ICD-10-CM | POA: Diagnosis not present

## 2021-03-04 DIAGNOSIS — M25512 Pain in left shoulder: Secondary | ICD-10-CM | POA: Diagnosis not present

## 2021-04-19 ENCOUNTER — Inpatient Hospital Stay: Payer: Federal, State, Local not specified - PPO

## 2021-04-19 ENCOUNTER — Other Ambulatory Visit: Payer: Self-pay

## 2021-04-19 ENCOUNTER — Inpatient Hospital Stay: Payer: Federal, State, Local not specified - PPO | Attending: Hematology | Admitting: Hematology

## 2021-04-19 VITALS — BP 115/70 | HR 78 | Temp 97.9°F | Resp 18 | Ht 66.0 in | Wt 204.3 lb

## 2021-04-19 DIAGNOSIS — Z853 Personal history of malignant neoplasm of breast: Secondary | ICD-10-CM | POA: Insufficient documentation

## 2021-04-19 DIAGNOSIS — Z9221 Personal history of antineoplastic chemotherapy: Secondary | ICD-10-CM | POA: Insufficient documentation

## 2021-04-19 DIAGNOSIS — Z9013 Acquired absence of bilateral breasts and nipples: Secondary | ICD-10-CM | POA: Diagnosis not present

## 2021-04-19 DIAGNOSIS — C50512 Malignant neoplasm of lower-outer quadrant of left female breast: Secondary | ICD-10-CM | POA: Diagnosis not present

## 2021-04-19 DIAGNOSIS — D649 Anemia, unspecified: Secondary | ICD-10-CM | POA: Diagnosis not present

## 2021-04-19 DIAGNOSIS — I10 Essential (primary) hypertension: Secondary | ICD-10-CM | POA: Diagnosis not present

## 2021-04-19 DIAGNOSIS — Z171 Estrogen receptor negative status [ER-]: Secondary | ICD-10-CM

## 2021-04-19 DIAGNOSIS — Z79899 Other long term (current) drug therapy: Secondary | ICD-10-CM | POA: Diagnosis not present

## 2021-04-19 LAB — COMPREHENSIVE METABOLIC PANEL
ALT: 17 U/L (ref 0–44)
AST: 19 U/L (ref 15–41)
Albumin: 3.6 g/dL (ref 3.5–5.0)
Alkaline Phosphatase: 90 U/L (ref 38–126)
Anion gap: 10 (ref 5–15)
BUN: 15 mg/dL (ref 6–20)
CO2: 30 mmol/L (ref 22–32)
Calcium: 9.5 mg/dL (ref 8.9–10.3)
Chloride: 101 mmol/L (ref 98–111)
Creatinine, Ser: 0.74 mg/dL (ref 0.44–1.00)
GFR, Estimated: >60 mL/min (ref >60)
Glucose, Bld: 121 mg/dL — ABNORMAL HIGH (ref 70–99)
Potassium: 4.4 mmol/L (ref 3.5–5.1)
Sodium: 141 mmol/L (ref 135–145)
Total Bilirubin: 0.9 mg/dL (ref 0.3–1.2)
Total Protein: 7.4 g/dL (ref 6.5–8.1)

## 2021-04-19 LAB — CBC WITH DIFFERENTIAL/PLATELET
Abs Immature Granulocytes: 0.02 10*3/uL (ref 0.00–0.07)
Basophils Absolute: 0.1 10*3/uL (ref 0.0–0.1)
Basophils Relative: 1 %
Eosinophils Absolute: 0.2 10*3/uL (ref 0.0–0.5)
Eosinophils Relative: 2 %
HCT: 34 % — ABNORMAL LOW (ref 36.0–46.0)
Hemoglobin: 11.6 g/dL — ABNORMAL LOW (ref 12.0–15.0)
Immature Granulocytes: 0 %
Lymphocytes Relative: 18 %
Lymphs Abs: 1.9 10*3/uL (ref 0.7–4.0)
MCH: 29.4 pg (ref 26.0–34.0)
MCHC: 34.1 g/dL (ref 30.0–36.0)
MCV: 86.1 fL (ref 80.0–100.0)
Monocytes Absolute: 0.8 10*3/uL (ref 0.1–1.0)
Monocytes Relative: 8 %
Neutro Abs: 7.5 10*3/uL (ref 1.7–7.7)
Neutrophils Relative %: 71 %
Platelets: 336 10*3/uL (ref 150–400)
RBC: 3.95 MIL/uL (ref 3.87–5.11)
RDW: 12.1 % (ref 11.5–15.5)
WBC: 10.5 10*3/uL (ref 4.0–10.5)
nRBC: 0 % (ref 0.0–0.2)

## 2021-04-19 NOTE — Progress Notes (Addendum)
[name]   Telephone:([phone] Fax:([phone]) [phone]   Clinic Follow up Note   Patient Care Team: Gweneth Dimitri, MD as PCP - General (Family Medicine) Malachy Mood, MD as Consulting Physician (Hematology) Almond Lint, MD as Consulting Physician (General Surgery) Dorothy Puffer, MD as Consulting Physician (Radiation Oncology) Loa Socks, NP as Nurse Practitioner (Hematology and Oncology)  Date of Service: 04/19/2021  CHIEF COMPLAINT: f/u of left breast cancer, triple negative  SUMMARY OF ONCOLOGIC HISTORY: Oncology History Overview Note  Cancer Staging Malignant neoplasm of lower-outer quadrant of left breast of female, estrogen receptor negative (HCC) Staging form: Breast, AJCC 8th Edition - Clinical: Stage IIB (cT2, cN0(f), cM0, G3, ER: Negative, PR: Negative, HER2: Negative) - Unsigned     Malignant neoplasm of lower-outer quadrant of left breast of female, estrogen receptor negative (HCC)  04/27/2017 Mammogram   IMPRESSION: 1. Highly suspicious mass within the LEFT breast at the 5:30 o'clock axis, 4 cm from the nipple, measuring 2.5 cm, corresponding to the mammographic finding and corresponding to 1 of the palpable lumps in the left breast. Ultrasound-guided biopsy is recommended. 2. Mildly prominent lymph node in the LEFT axilla, with normal cortical thickness but with questionable effacement of the fatty hilum. Ultrasound-guided core biopsy is recommended. 3. No evidence of malignancy within the RIGHT breast. Also, no evidence of malignancy within the second area of clinical concern in the upper outer left breast.   04/29/2017 Initial Diagnosis   Malignant neoplasm of lower-outer quadrant of left breast of female, estrogen receptor negative (HCC)   04/29/2017 Receptors her2   Estrogen Receptor: 0%, NEGATIVE Progesterone Receptor: 0%, NEGATIVE Proliferation Marker Ki67: 90% HER2 NEGATIVE    04/29/2017 Initial Biopsy    Diagnosis 1.  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 - SEE COMMENT. 2. Lymph node, needle/core biopsy, left axilla - THERE IN NO EVIDENCE OF CARCINOMA IN 1 OF 1 LYMPH NODE (0/1).   05/14/2017 Imaging   MRI of Breast Bilateral 05/14/17  IMPRESSION: 1. 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 2. Non mass enhancement extending approximately 3.2 cm anterior to the mass in the lower inner quadrant of the left breast, suspicious for DCIS. There is no correlate on recent mammography. The overall extent of the mass and non mass enhancement is approximately 5 cm. 3. No MRI evidence of malignancy involving the right breast. 4. No pathologic lymphadenopathy. Upper normal sized left axillary lymph nodes, the largest of which was previously biopsied no evidence of metastatic disease.   05/15/2017 Imaging   Bone scan whole body 05/15/17 IMPRESSION: No findings specific for metastatic disease to bone.   05/15/2017 Imaging   CT CAP W contrast 05/15/17  IMPRESSION: 1. 18 mm soft tissue lesion inferior left breast compatible with known primary malignancy. 2. Mildly enlarged left axillary lymph nodes in this patient with biopsy-proven metastatic disease to the left axilla. 3. No evidence for lymphadenopathy elsewhere in the chest. No lymphadenopathy in the abdomen or pelvis. No evidence for metastatic disease in the abdomen or pelvis. 4. Tiny right perifissural lung nodule most likely subpleural lymph node. Attention on follow-up recommended. 5. 7 mm hypoattenuating lesion in the spleen, likely a cyst or pseudocyst. Attention on follow-up recommended. 6. Uterine fibroids. 7.  Aortic Atherosclerois (ICD10-170.0)   05/19/2017 Procedure   Port placement by Dr. Donell Beers on 9/11/8  05/22/2017 - 10/09/2017 Chemotherapy    neoadjuvant  chemotherpy AC every 2 weeks for 4 cycles starting 05/22/17 and completed on 07/07/17, followed by weekly paclitaxel for 12 cycles starting 07/24/17 to complete on 10/09/17    05/22/2017 Pathology Results   Diagnosis Breast, left, needle core biopsy, lower inner quad - DUCTAL CARCINOMA IN SITU. 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  The genes analyzed were the 46 genes on [facility]'s Common Cancers panel (APC, ATM, AXIN2, BARD1, BMPR1A, BRCA1, BRCA2, BRIP1, CDH1, CDKN2A, CHEK2, CTNNA1, DICER1, EPCAM, GREM1, HOXB13, KIT, MEN1, MLH1, MSH2, MSH3, MSH6, MUTYH, NBN, NF1, NTHL1, PALB2, PDGFRA, PMS2, POLD1, POLE, PTEN, RAD50, RAD51C, RAD51D, SDHA, SDHB, SDHC, SDHD, SMAD4, SMARCA4, STK11, TP53, TSC1, TSC2, VHL).     08/14/2017 Echocardiogram   EF 60-65%  Study Conclusions   - Left ventricle: The cavity size was normal. Systolic function was   normal. The estimated ejection fraction was in the range of 60%   to 65%. Wall motion was normal; there were no regional wall   motion abnormalities. Doppler parameters are consistent with   abnormal left ventricular relaxation (grade 1 diastolic   dysfunction). - Pulmonary arteries: PA peak pressure: 31 mm Hg (S).   Impressions:   - No change from September 2018 study (strain rate not performed on   that study).       10/14/2017 Imaging   MRI Breast Bilateral  IMPRESSION:  Significant improvement following neoadjuvant treatment.   No residual enhancement in the left breast at sites of known malignancy.   RECOMMENDATION: Treatment plan for known malignancy.   11/19/2017 Surgery   BILATERAL MASTECTOMIES WITH LEFT SENTINEL LYMPH NODE BIOPSY by Dr. [name] 11/19/17   11/19/2017 Pathology Results    Diagnosis 11/19/17 1. Breast, simple mastectomy, Right - FIBROCYSTIC AND FIBROADENOMATOID CHANGE. - NO MALIGNANCY IDENTIFIED. 2.  Breast, simple mastectomy, Left - NO RESIDUAL CARCINOMA IDENTIFIED. - BIOPSY SITES. - FIBROCYSTIC AND FIBROADENOMATOID CHANGE. - SEE ONCOLOGY TABLE. 3. Lymph node, sentinel, biopsy, Left Axillary #1 - ONE OF ONE LYMPH NODES NEGATIVE FOR CARCINOMA (0/1). - BIOPSY SITE. 4. Lymph node, sentinel, biopsy, Left Axillary # 2 - ONE OF ONE LYMPH NODES NEGATIVE FOR CARCINOMA (0/1).   12/29/2018 Imaging   CT CAP W Contrast  IMPRESSION: No evidence of recurrent or metastatic carcinoma. No other acute findings.   3.5 cm right uterine fibroid.   Colonic diverticulosis, without radiographic evidence of diverticulitis.        CURRENT THERAPY:  Surveillance  INTERVAL HISTORY:  Briana Price is here for a follow up of breast cancer. She was last seen by me on 10/22/20. She presents to the clinic alone. She reports she is doing well overall. She notes some increased joint pain. She notes the pain is worse in the morning but is present throughout the day. She endorses rarely using tylenol, otherwise she just waits for the pain to go away. She notes she needs to be more active.   All other systems were reviewed with the patient and are negative.  MEDICAL HISTORY:  Past Medical History:  Diagnosis Date   Anemia    Arthritis    Breast cancer ([place])    Cancer ([place])    left breast cancer   Family history of breast cancer    Genetic testing 05/28/2017   Common Cancers panel (46 genes) @ [facility] - No pathogenic mutations detected  GERD (gastroesophageal reflux disease)    Headache    Migraines   Hypertension    Non-healing surgical wound    right chest wall    SURGICAL HISTORY: Past Surgical History:  Procedure Laterality Date   BREAST SURGERY     biopsy   CESAREAN SECTION     CHOLECYSTECTOMY N/A 02/07/2013   Procedure: LAPAROSCOPIC CHOLECYSTECTOMY WITH INTRAOPERATIVE CHOLANGIOGRAM;  Surgeon: Gwenyth Ober, MD;  Location: [place];  Service: General;  Laterality:  N/A;   COLONOSCOPY W/ BIOPSIES AND POLYPECTOMY     COLONOSCOPY W/ BIOPSIES AND POLYPECTOMY     EXCISION OF BREAST LESION Right 06/08/2019   Procedure: EXCISION OF NON HEALING WOUND RIGHT CHEST;  Surgeon: [name], MD;  Location: [place];  Service: General;  Laterality: Right;   FOOT SURGERY  2003   lt foot bunionectomy   MASTECTOMY W/ SENTINEL NODE BIOPSY Bilateral 11/19/2017   Procedure: BILATERAL MASTECTOMIES WITH LEFT SENTINEL LYMPH NODE BIOPSY;  Surgeon: [name], MD;  Location: [place];  Service: General;  Laterality: Bilateral;   MULTIPLE TOOTH EXTRACTIONS     PORTACATH PLACEMENT Right 05/19/2017   Procedure: INSERTION PORT-A-CATH;  Surgeon: [name], MD;  Location: [place];  Service: General;  Laterality: Right;    I have reviewed the social history and family history with the patient and they are unchanged from previous note.  ALLERGIES:  has No Known Allergies.  MEDICATIONS:  Current Outpatient Medications  Medication Sig Dispense Refill   acetaminophen (TYLENOL) 500 MG tablet Take 1,000 mg by mouth every 8 (eight) hours as needed for moderate pain or headache.     Ascorbic Acid (VITAMIN C) 1000 MG tablet Take 1,000 mg by mouth daily.     atorvastatin (LIPITOR) 20 MG tablet Take 1 tablet by mouth daily.     hydrochlorothiazide (HYDRODIURIL) 25 MG tablet Take 1 tablet by mouth daily.     losartan (COZAAR) 50 MG tablet Take 50 mg by mouth daily.     Multiple Vitamins-Minerals (MULTIVITAMIN ADULT PO) Take 1 tablet by mouth daily.     nystatin-triamcinolone (MYCOLOG II) cream Apply topically 2 (two) times daily.     No current facility-administered medications for this visit.   Facility-Administered Medications Ordered in Other Visits  Medication Dose Route Frequency Provider Last Rate Last Admin   heparin lock flush 100 unit/mL  500 Units Intravenous Once PRN [name], MD       sodium chloride flush (NS) 0.9 % injection 10 mL  10 mL Intravenous PRN  [name], MD   10 mL at 08/07/17 1354   sodium chloride flush (NS) 0.9 % injection 10 mL  10 mL Intravenous PRN [name], MD        PHYSICAL EXAMINATION: ECOG PERFORMANCE STATUS: 0 - Asymptomatic  Vitals:   04/19/21 1509  BP: 115/70  Pulse: 78  Resp: 18  Temp: 97.9 F (36.6 C)  SpO2: 99%   Wt Readings from Last 3 Encounters:  04/19/21 204 lb 4.8 oz (92.7 kg)  10/22/20 211 lb 4.8 oz (95.8 kg)  04/19/20 221 lb 6.4 oz (100.4 kg)     GENERAL:alert, no distress and comfortable SKIN: skin color, texture, turgor are normal, no rashes or significant lesions EYES: normal, Conjunctiva are pink and non-injected, sclera clear  NECK: supple, thyroid normal size, non-tender, without nodularity LYMPH:  no palpable lymphadenopathy in the cervical, axillary  LUNGS: clear to auscultation and percussion with normal breathing effort HEART: regular rate &  rhythm and no murmurs and no lower extremity edema ABDOMEN:abdomen soft, non-tender and normal bowel sounds Musculoskeletal:no cyanosis of digits and no clubbing  NEURO: alert & oriented x 3 with fluent speech, no focal motor/sensory deficits BREAST: s/p b/l mastectomies; No palpable mass, nodules or adenopathy bilaterally. Breast exam benign.   LABORATORY DATA:  I have reviewed the data as listed CBC Latest Ref Rng & Units 04/19/2021 10/22/2020 04/19/2020  WBC 4.0 - 10.5 K/uL 10.5 7.7 8.5  Hemoglobin 12.0 - 15.0 g/dL 11.6(L) 13.1 12.6  Hematocrit 36.0 - 46.0 % 34.0(L) 38.4 37.3  Platelets 150 - 400 K/uL 336 314 323     CMP Latest Ref Rng & Units 04/19/2021 10/22/2020 04/19/2020  Glucose 70 - 99 mg/dL 121(H) 144(H) 110(H)  BUN 6 - 20 mg/dL $Remove'15 7 9  'OdLjWdc$ Creatinine 0.44 - 1.00 mg/dL 0.74 0.69 0.72  Sodium 135 - 145 mmol/L 141 139 141  Potassium 3.5 - 5.1 mmol/L 4.4 2.9(L) 3.4(L)  Chloride 98 - 111 mmol/L 101 104 102  CO2 22 - 32 mmol/L $RemoveB'30 26 30  'JCHmVISk$ Calcium 8.9 - 10.3 mg/dL 9.5 9.3 10.0  Total Protein 6.5 - 8.1 g/dL 7.4 7.6 7.6  Total Bilirubin  0.3 - 1.2 mg/dL 0.9 0.8 0.8  Alkaline Phos 38 - 126 U/L 90 82 83  AST 15 - 41 U/L $Remo'19 22 23  'aWwgA$ ALT 0 - 44 U/L $Remo'17 23 25      'hRcSD$ RADIOGRAPHIC STUDIES: I have personally reviewed the radiological images as listed and agreed with the findings in the report. No results found.   ASSESSMENT & PLAN:  Briana Price is a 55 y.o. female with   1. 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 ypT0N0 -She was diagnosed in 04/2017. She is s/p neoadjuvant AC-T and b/l mastectomy. She has recovered well from treatment.  -She post-poned breast reconstruction at [facility] due to [place] and new variant. She will proceed when ready so her mother can be there with her.  -She is clinically doing well. Lab reviewed, her CBC and CMP are within normal limits except Hg 11.6. I will give her another bra prosthesis prescription today. -She is 4 years since her cancer diagnosis. Her risk of recurrence has significantly decreased. Continue 5 years surveillance plan. She is s/p B/l mastectomy so she does not need mammograms.  -F/u in 1 year  2. Mild Anemia -new, noted hgb 11.6 on 04/19/21 -she denies any periods since prior to chemotherapy. -I recommend she check her blood counts wither with her PCP or with [place] in 6 months -she is taking prenatal MVI    3. Genetic Testing was negative for pathogenetic mutations with VUS of ATM.    4. HTN -Continue follow-up with her primary care physician  -She is on HCTZ and losartan. Will continue    5. 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    6. Cancer screenings  -She had a colonoscopy with Dr. Collene Mares since completing chemo. -She is up to date on her Pap Smears, will continue    7. Low back pain, joint pain -She has had lumbar back pain before but has worsened recently with relief from sitting and leaning forward. This is  Intermittent -She notes increasing joint pain since her last visit. She takes tylenol rarely.  -She will continue to f/u with Orthopedic surgeon. This is likely arthritis. Stable.  8. Bone Heath  -Once she is post-menopausal, will order base line DEXA -She can start oral calcium now. She note prior elevated Vit D level, so she is holding it for now.      PLAN:  -labs with blood counts with PCP or [place] in 6 months for anemia f/u  -Lab and f/u in 1 year     No problem-specific Assessment & Plan notes found for this encounter.   No orders of the defined types were placed in this encounter.  All questions were answered. The patient knows to call the clinic with any problems, questions or concerns. No barriers to learning was detected. The total time spent in the appointment was 25 minutes.     [name], MD 04/19/2021  I, Wilburn Mylar, am acting as scribe for [name], MD.   I have reviewed the above documentation for accuracy and completeness, and I agree with the above.

## 2021-04-20 ENCOUNTER — Encounter: Payer: Self-pay | Admitting: Hematology

## 2021-05-07 ENCOUNTER — Other Ambulatory Visit: Payer: Self-pay

## 2021-05-07 ENCOUNTER — Encounter (HOSPITAL_BASED_OUTPATIENT_CLINIC_OR_DEPARTMENT_OTHER): Payer: Self-pay | Admitting: Cardiovascular Disease

## 2021-05-07 ENCOUNTER — Ambulatory Visit (INDEPENDENT_AMBULATORY_CARE_PROVIDER_SITE_OTHER): Payer: Federal, State, Local not specified - PPO | Admitting: Cardiovascular Disease

## 2021-05-07 VITALS — BP 120/80 | HR 74 | Ht 66.0 in | Wt 199.4 lb

## 2021-05-07 DIAGNOSIS — I10 Essential (primary) hypertension: Secondary | ICD-10-CM

## 2021-05-07 NOTE — Assessment & Plan Note (Signed)
BP was initially elevated but better on repeat.  We will continue losartan/HCTZ for now.  She is going to work on increasing her exercise to at least 150 minutes weekly.  We will refer her to the PR EP program to the [facility].  She will also work on limiting sodium to no more than 1500 to 2000 mg daily.  Her blood pressure remains uncontrolled as she is checking it at home, would consider switching losartan to valsartan.

## 2021-05-07 NOTE — Addendum Note (Signed)
Addended by: Alvina Filbert B on: 05/07/2021 04:39 PM   Modules accepted: Orders

## 2021-05-07 NOTE — Patient Instructions (Addendum)
Medication Instructions:  Your physician recommends that you continue on your current medications as directed. Please refer to the Current Medication list given to you today.    Labwork: NONE   Testing/Procedures: NONE   Follow-Up: 09/12/2021 2:15 PM WITH DR [place] AT [place] will receive a phone call from the [facility] exercise and nutrition program to schedule an initial assessment.   Special Instructions:   TRY TO EXERCISE 150 MINUTES EACH WEEK   MONITOR YOUR BLOOD PRESSURE TWICE A DAY, LOG IN THE BOOK PROVIDED. BRING THE BOOK AND YOUR BLOOD PRESSURE MACHINE TO YOUR FOLLOW UP IN 3 MONTHS  DASH Eating Plan DASH stands for "Dietary Approaches to Stop Hypertension." The DASH eating plan is a healthy eating plan that has been shown to reduce high blood pressure (hypertension). It may also reduce your risk for type 2 diabetes, heart disease, and stroke. The DASH eating plan may also help with weight loss. What are tips for following this plan?  General guidelines Avoid eating more than 2,300 mg (milligrams) of salt (sodium) a day. If you have hypertension, you may need to reduce your sodium intake to 1,500 mg a day. Limit alcohol intake to no more than 1 drink a day for nonpregnant women and 2 drinks a day for men. One drink equals 12 oz of beer, 5 oz of wine, or 1 oz of hard liquor. Work with your health care provider to maintain a healthy body weight or to lose weight. Ask what an ideal weight is for you. Get at least 30 minutes of exercise that causes your heart to beat faster (aerobic exercise) most days of the week. Activities may include walking, swimming, or biking. Work with your health care provider or diet and nutrition specialist (dietitian) to adjust your eating plan to your individual calorie needs. Reading food labels  Check food labels for the amount of sodium per serving. Choose foods with less than 5 percent of the Daily Value of sodium. Generally, foods  with less than 300 mg of sodium per serving fit into this eating plan. To find whole grains, look for the word "whole" as the first word in the ingredient list. Shopping Buy products labeled as "low-sodium" or "no salt added." Buy fresh foods. Avoid canned foods and premade or frozen meals. Cooking Avoid adding salt when cooking. Use salt-free seasonings or herbs instead of table salt or sea salt. Check with your health care provider or pharmacist before using salt substitutes. Do not fry foods. Cook foods using healthy methods such as baking, boiling, grilling, and broiling instead. Cook with heart-healthy oils, such as olive, canola, soybean, or sunflower oil. Meal planning Eat a balanced diet that includes: 5 or more servings of fruits and vegetables each day. At each meal, try to fill half of your plate with fruits and vegetables. Up to 6-8 servings of whole grains each day. Less than 6 oz of lean meat, poultry, or fish each day. A 3-oz serving of meat is about the same size as a deck of cards. One egg equals 1 oz. 2 servings of low-fat dairy each day. A serving of nuts, seeds, or beans 5 times each week. Heart-healthy fats. Healthy fats called Omega-3 fatty acids are found in foods such as flaxseeds and coldwater fish, like sardines, salmon, and mackerel. Limit how much you eat of the following: Canned or prepackaged foods. Food that is high in trans fat, such as fried foods. Food that is high in saturated  fat, such as fatty meat. Sweets, desserts, sugary drinks, and other foods with added sugar. Full-fat dairy products. Do not salt foods before eating. Try to eat at least 2 vegetarian meals each week. Eat more home-cooked food and less restaurant, buffet, and fast food. When eating at a restaurant, ask that your food be prepared with less salt or no salt, if possible. What foods are recommended? The items listed may not be a complete list. Talk with your dietitian about what dietary  choices are best for you. Grains Whole-grain or whole-wheat bread. Whole-grain or whole-wheat pasta. Brown rice. [place]. Bulgur. Whole-grain and low-sodium cereals. Pita bread. Low-fat, low-sodium crackers. Whole-wheat flour tortillas. Vegetables Fresh or frozen vegetables (raw, steamed, roasted, or grilled). Low-sodium or reduced-sodium tomato and vegetable juice. Low-sodium or reduced-sodium tomato sauce and tomato paste. Low-sodium or reduced-sodium canned vegetables. Fruits All fresh, dried, or frozen fruit. Canned fruit in natural juice (without added sugar). Meat and other protein foods Skinless chicken or [place]. Ground chicken or [place]. Pork with fat trimmed off. Fish and seafood. Egg whites. Dried beans, peas, or lentils. Unsalted nuts, nut butters, and seeds. Unsalted canned beans. Lean cuts of beef with fat trimmed off. Low-sodium, lean deli meat. Dairy Low-fat (1%) or fat-free (skim) milk. Fat-free, low-fat, or reduced-fat cheeses. Nonfat, low-sodium ricotta or cottage cheese. Low-fat or nonfat yogurt. Low-fat, low-sodium cheese. Fats and oils Soft margarine without trans fats. Vegetable oil. Low-fat, reduced-fat, or light mayonnaise and salad dressings (reduced-sodium). Canola, safflower, olive, soybean, and sunflower oils. Avocado. Seasoning and other foods Herbs. Spices. Seasoning mixes without salt. Unsalted popcorn and pretzels. Fat-free sweets. What foods are not recommended? The items listed may not be a complete list. Talk with your dietitian about what dietary choices are best for you. Grains Baked goods made with fat, such as croissants, muffins, or some breads. Dry pasta or rice meal packs. Vegetables Creamed or fried vegetables. Vegetables in a cheese sauce. Regular canned vegetables (not low-sodium or reduced-sodium). Regular canned tomato sauce and paste (not low-sodium or reduced-sodium). Regular tomato and vegetable juice (not low-sodium or reduced-sodium).  Briana Price. Olives. Fruits Canned fruit in a light or heavy syrup. Fried fruit. Fruit in cream or butter sauce. Meat and other protein foods Fatty cuts of meat. Ribs. Fried meat. Briana Price. Sausage. Bologna and other processed lunch meats. Salami. Fatback. Hotdogs. Bratwurst. Salted nuts and seeds. Canned beans with added salt. Canned or smoked fish. Whole eggs or egg yolks. Chicken or [place] with skin. Dairy Whole or 2% milk, cream, and half-and-half. Whole or full-fat cream cheese. Whole-fat or sweetened yogurt. Full-fat cheese. Nondairy creamers. Whipped toppings. Processed cheese and cheese spreads. Fats and oils Butter. Stick margarine. Lard. Shortening. Ghee. Bacon fat. Tropical oils, such as coconut, palm kernel, or palm oil. Seasoning and other foods Salted popcorn and pretzels. Onion salt, garlic salt, seasoned salt, table salt, and sea salt. Worcestershire sauce. Tartar sauce. Barbecue sauce. Teriyaki sauce. Soy sauce, including reduced-sodium. Steak sauce. Canned and packaged gravies. Fish sauce. Oyster sauce. Cocktail sauce. Horseradish that you find on the shelf. Ketchup. Mustard. Meat flavorings and tenderizers. Bouillon cubes. Hot sauce and Tabasco sauce. Premade or packaged marinades. Premade or packaged taco seasonings. Relishes. Regular salad dressings. Where to find more information: National Heart, Lung, and [place]: https://wilson-eaton.com/ American Heart Association: www.heart.org Summary The DASH eating plan is a healthy eating plan that has been shown to reduce high blood pressure (hypertension). It may also reduce your risk for type 2 diabetes, heart disease, and  stroke. With the DASH eating plan, you should limit salt (sodium) intake to 2,300 mg a day. If you have hypertension, you may need to reduce your sodium intake to 1,500 mg a day. When on the DASH eating plan, aim to eat more fresh fruits and vegetables, whole grains, lean proteins, low-fat dairy, and heart-healthy  fats. Work with your health care provider or diet and nutrition specialist (dietitian) to adjust your eating plan to your individual calorie needs. This information is not intended to replace advice given to you by your health care provider. Make sure you discuss any questions you have with your health care provider. Document Released: 08/14/2011 Document Revised: 08/07/2017 Document Reviewed: 08/18/2016 Elsevier Patient Education  2020 [employer].

## 2021-05-07 NOTE — Progress Notes (Signed)
Advanced Hypertension Clinic Initial Assessment:    Date:  05/07/2021   ID:  Price, Briana [birth year], MRN [id number]  PCP:  [name], MD  Cardiologist:  None  Nephrologist:  Referring MD: [name], MD   CC: Hypertension  History of Present Illness:    Briana Price is a 55 y.o. female with a hx of anemia, arthritis, breast cancer, GERD, and hypertension, here to establish care in the Advanced Hypertension Clinic. She saw Dr. Garwin Brothers on 01/2021 and her blood pressure was 138/98 on HCTZ and Losartan, so she was referred to advanced hypertension clinic. She has a history of breast cancer and underwent bilateral mastectomy. She last saw oncology earlier this month and her blood pressure was well controlled.  Today, she reports being dx with hypertension in 2005 or 2006, and she began taking '25mg'$  HCTZ. Three to four years ago she was started on lisinopril. On average, her blood pressure is 99991111 systolic. Over several days, she had a few episodes of a "pulling" chest pain in her central chest. This occurred randomly while at rest, when she changed position. For her diet, she mostly eats fish and vegetables. She avoids other meats. Occasionally, she may eat pizza and french fries. She drinks a lot of sodas, more often than water. At night she may have ginger tea with sea moss. Alcohol consumption is rare. For pain management she takes tylenol for headaches, and aleve for muscle aches/pain. For activity, she is not participating in formal exercise. When she does go up an incline or stairs, she "breathes hard" but denies any chest pain. Lately she has not been told that she snores. Typically she will not sleep all night, and never sleeps for 8 hours. She does not feel well-rested or fatigued when she wakes up. Prior to her double mastectomy she had completed chemotherapy. She denies any palpitations. No lightheadedness, syncope, orthopnea, or PND. Also has no lower extremity  edema.   Past Medical History:  Diagnosis Date   Anemia    Arthritis    Breast cancer ([place])    Cancer ([place])    left breast cancer   Family history of breast cancer    Genetic testing 05/28/2017   Common Cancers panel (46 genes) @ [facility] - No pathogenic mutations detected   GERD (gastroesophageal reflux disease)    Headache    Migraines   Hypertension    Non-healing surgical wound    right chest wall    Past Surgical History:  Procedure Laterality Date   BREAST SURGERY     biopsy   CESAREAN SECTION     CHOLECYSTECTOMY N/A 02/07/2013   Procedure: LAPAROSCOPIC CHOLECYSTECTOMY WITH INTRAOPERATIVE CHOLANGIOGRAM;  Surgeon: Gwenyth Ober, MD;  Location: [facility];  Service: General;  Laterality: N/A;   COLONOSCOPY W/ BIOPSIES AND POLYPECTOMY     COLONOSCOPY W/ BIOPSIES AND POLYPECTOMY     EXCISION OF BREAST LESION Right 06/08/2019   Procedure: EXCISION OF NON HEALING WOUND RIGHT CHEST;  Surgeon: [name], MD;  Location: [facility];  Service: General;  Laterality: Right;   FOOT SURGERY  2003   lt foot bunionectomy   MASTECTOMY W/ SENTINEL NODE BIOPSY Bilateral 11/19/2017   Procedure: BILATERAL MASTECTOMIES WITH LEFT SENTINEL LYMPH NODE BIOPSY;  Surgeon: [name], MD;  Location: [facility];  Service: General;  Laterality: Bilateral;   MULTIPLE TOOTH EXTRACTIONS     PORTACATH PLACEMENT Right 05/19/2017   Procedure: INSERTION PORT-A-CATH;  Surgeon: [name],  Dorris Fetch, MD;  Location: [facility];  Service: General;  Laterality: Right;    Current Medications: Current Meds  Medication Sig   acetaminophen (TYLENOL) 500 MG tablet Take 1,000 mg by mouth every 8 (eight) hours as needed for moderate pain or headache.   Ascorbic Acid (VITAMIN C) 1000 MG tablet Take 1,000 mg by mouth daily.   atorvastatin (LIPITOR) 20 MG tablet Take 1 tablet by mouth daily.   BIOTIN PO Take 100 mg by mouth daily.   hydrochlorothiazide (HYDRODIURIL) 25 MG tablet Take 1 tablet by mouth  daily.   losartan (COZAAR) 100 MG tablet Take 100 mg by mouth daily.   Multiple Vitamins-Minerals (MULTIVITAMIN ADULT PO) Take 1 tablet by mouth daily.   nystatin-triamcinolone (MYCOLOG II) cream Apply topically 2 (two) times daily.   [DISCONTINUED] losartan (COZAAR) 50 MG tablet Take 50 mg by mouth daily.     Allergies:   Patient has no known allergies.   Social History   Socioeconomic History   Marital status: Married    Spouse name: Not on file   Number of children: Not on file   Years of education: Not on file   Highest education level: Not on file  Occupational History   Not on file  Tobacco Use   Smoking status: Former    Types: Cigarettes   Smokeless tobacco: Never   Tobacco comments:    15-20 years ago  Vaping Use   Vaping Use: Never used  Substance and Sexual Activity   Alcohol use: No   Drug use: No   Sexual activity: Not on file  Other Topics Concern   Not on file  Social History Narrative   Tobacco use   Cigarettes: Never smoked    Tobacco history last updated 01/23/2014   No smoking   No tobacco exposure   No alcohol   Caffeine : yes soda   No recreational drug use   Occupation :employed ,Briana Price 01/25/2013   Martial status : single    Children: Briana Price 01/25/2013   68 year old son   Social Determinants of Health   Financial Resource Strain: Not on file  Food Insecurity: Not on file  Transportation Needs: Not on file  Physical Activity: Not on file  Stress: Not on file  Social Connections: Not on file     Family History: The patient's family history includes Breast cancer in her maternal grandmother and other family members; Breast cancer (age of onset: 37) in her paternal aunt; Breast cancer (age of onset: 10) in her maternal aunt; Heart attack in her maternal grandfather; Hypertension in her mother; Leukemia in her paternal uncle.  ROS:   Please see the history of present illness.    (+) Shortness of breath (+) "Pulling" chest  pain All other systems reviewed and are negative.  EKGs/Labs/Other Studies Reviewed:    TTE 02/15/2018: - Left ventricle: The cavity size was normal. Wall thickness was    increased in a pattern of mild LVH. Systolic function was normal.    The estimated ejection fraction was in the range of 60% to 65%.    Wall motion was normal; there were no regional wall motion    abnormalities. Doppler parameters are consistent with abnormal    left ventricular relaxation (grade 1 diastolic dysfunction).  - Impressions: GLS -19.9%. LS&' 9.8 cm/s.   EKG:   05/07/2021: Sinus rhythm. Rate 74 bpm. LVH.  Recent Labs: 04/19/2021: ALT 17; BUN 15; Creatinine, Ser 0.74; Hemoglobin 11.6; Platelets  336; Potassium 4.4; Sodium 141   Recent Lipid Panel No results found for: CHOL, TRIG, HDL, CHOLHDL, VLDL, LDLCALC, LDLDIRECT  Physical Exam:   VS:  BP 120/80 (BP Location: Right Arm, Patient Position: Sitting)   Pulse 74   Ht '5\' 6"'$  (1.676 m)   Wt 199 lb 6.4 oz (90.4 kg)   BMI 32.18 kg/m  , BMI Body mass index is 32.18 kg/m. GENERAL:  Well appearing HEENT: Pupils equal round and reactive, fundi not visualized, oral mucosa unremarkable NECK:  No jugular venous distention, waveform within normal limits, carotid upstroke brisk and symmetric, no bruits LUNGS:  Clear to auscultation bilaterally HEART:  RRR.  PMI not displaced or sustained,S1 and S2 within normal limits, no S3, no S4, no clicks, no rubs, no murmurs ABD:  Flat, positive bowel sounds normal in frequency in pitch, no bruits, no rebound, no guarding, no midline pulsatile mass, no hepatomegaly, no splenomegaly EXT:  2 plus pulses throughout, no edema, no cyanosis no clubbing SKIN:  No rashes no nodules NEURO:  Cranial nerves II through XII grossly intact, motor grossly intact throughout PSYCH:  Cognitively intact, oriented to person place and time   ASSESSMENT/PLAN:    Essential (primary) hypertension BP was initially elevated but better on repeat.   We will continue losartan/HCTZ for now.  She is going to work on increasing her exercise to at least 150 minutes weekly.  We will refer her to the PR EP program to the [facility].  She will also work on limiting sodium to no more than 1500 to 2000 mg daily.  Her blood pressure remains uncontrolled as she is checking it at home, would consider switching losartan to valsartan.   Screening for Secondary Hypertension:  Causes 05/07/2021  Drugs/Herbals Screened     - Comments caffeine, sodium  Renovascular HTN N/A  Sleep Apnea Screened     - Comments snores.  no apea or daytime somnolence  Thyroid Disease Screened  Hyperaldosteronism N/A  Pheochromocytoma N/A  Cushing's Syndrome N/A  Hyperparathyroidism N/A  Coarctation of the Aorta Screened     - Comments unable to check BP bilaterally 2/2 s/p mastectomy and LN removal  Compliance Screened    Relevant Labs/Studies: Basic Labs Latest Ref Rng & Units 04/19/2021 10/22/2020 04/19/2020  Sodium 135 - 145 mmol/L 141 139 141  Potassium 3.5 - 5.1 mmol/L 4.4 2.9(L) 3.4(L)  Creatinine 0.44 - 1.00 mg/dL 0.74 0.69 0.72                   Disposition:    FU with Sabrinia Prien C. Oval Linsey, MD, [facility] in 3-4 months.   Medication Adjustments/Labs and Tests Ordered: Current medicines are reviewed at length with the patient today.  Concerns regarding medicines are outlined above.   Orders Placed This Encounter  Procedures   EKG 12-Lead    No orders of the defined types were placed in this encounter.  I,Mathew Stumpf,acting as a Education administrator for Skeet Latch, MD.,have documented all relevant documentation on the behalf of Skeet Latch, MD,as directed by  Skeet Latch, MD while in the presence of Skeet Latch, MD.  I, [place] Oval Linsey, MD have reviewed all documentation for this visit.  The documentation of the exam, diagnosis, procedures, and orders on 05/07/2021 are all accurate and complete.   Signed, Skeet Latch, MD  05/07/2021 4:19 PM     [facility]

## 2021-05-07 NOTE — Progress Notes (Signed)
Advanced Hypertension Clinic Initial Assessment:    Date:  05/07/2021   ID:  [name], [name] 1966-05-15, MRN [id number]  PCP:  [name], MD  Cardiologist:  None  Nephrologist:  Referring MD: [name], MD   CC: Hypertension  History of Present Illness:    Briana Price is a 55 y.o. female with a hx of anemia, arthritis, breast cancer, GERD, and hypertension, here to establish care in the Advanced Hypertension Clinic. She saw Dr. Garwin Brothers on 01/2021 and her blood pressure was 138/98 on HCTZ and Losartan, so she was referred to advanced hypertension clinic. She has a history of breast cancer and underwent bilateral mastectomy. She last saw oncology earlier this month and her blood pressure was well controlled.  Today, she reports being dx with hypertension in 2005 or 2006, and she began taking '25mg'$  HCTZ. Three to four years ago she was started on lisinopril. On average, her blood pressure is 99991111 systolic. Over several days, she had a few episodes of a "pulling" chest pain in her central chest. This occurred randomly while at rest, when she changed position. For her diet, she mostly eats fish and vegetables. She avoids other meats. Occasionally, she may eat pizza and french fries. She drinks a lot of sodas, more often than water. At night she may have ginger tea with sea moss. Alcohol consumption is rare. For pain management she takes tylenol for headaches, and aleve for muscle aches/pain. For activity, she is not participating in formal exercise. When she does go up an incline or stairs, she "breathes hard" but denies any chest pain. Lately she has not been told that she snores. Typically she will not sleep all night, and never sleeps for 8 hours. She does not feel well-rested or fatigued when she wakes up. Prior to her double mastectomy she had completed chemotherapy. She denies any palpitations. No lightheadedness, syncope, orthopnea, or PND. Also has no lower extremity  edema.   Past Medical History:  Diagnosis Date   Anemia    Arthritis    Breast cancer ([place])    Cancer ([place])    left breast cancer   Family history of breast cancer    Genetic testing 05/28/2017   Common Cancers panel (46 genes) @ [facility] - No pathogenic mutations detected   GERD (gastroesophageal reflux disease)    Headache    Migraines   Hypertension    Non-healing surgical wound    right chest wall    Past Surgical History:  Procedure Laterality Date   BREAST SURGERY     biopsy   CESAREAN SECTION     CHOLECYSTECTOMY N/A 02/07/2013   Procedure: LAPAROSCOPIC CHOLECYSTECTOMY WITH INTRAOPERATIVE CHOLANGIOGRAM;  Surgeon: Gwenyth Ober, MD;  Location: [place];  Service: General;  Laterality: N/A;   COLONOSCOPY W/ BIOPSIES AND POLYPECTOMY     COLONOSCOPY W/ BIOPSIES AND POLYPECTOMY     EXCISION OF BREAST LESION Right 06/08/2019   Procedure: EXCISION OF NON HEALING WOUND RIGHT CHEST;  Surgeon: [name], MD;  Location: [place];  Service: General;  Laterality: Right;   FOOT SURGERY  2003   lt foot bunionectomy   MASTECTOMY W/ SENTINEL NODE BIOPSY Bilateral 11/19/2017   Procedure: BILATERAL MASTECTOMIES WITH LEFT SENTINEL LYMPH NODE BIOPSY;  Surgeon: [name], MD;  Location: [place];  Service: General;  Laterality: Bilateral;   MULTIPLE TOOTH EXTRACTIONS     PORTACATH PLACEMENT Right 05/19/2017   Procedure: INSERTION PORT-A-CATH;  Surgeon: [name],  Dorris Fetch, MD;  Location: [place];  Service: General;  Laterality: Right;    Current Medications: Current Meds  Medication Sig   acetaminophen (TYLENOL) 500 MG tablet Take 1,000 mg by mouth every 8 (eight) hours as needed for moderate pain or headache.   Ascorbic Acid (VITAMIN C) 1000 MG tablet Take 1,000 mg by mouth daily.   atorvastatin (LIPITOR) 20 MG tablet Take 1 tablet by mouth daily.   BIOTIN PO Take 100 mg by mouth daily.   hydrochlorothiazide (HYDRODIURIL) 25 MG tablet Take 1 tablet by mouth  daily.   losartan (COZAAR) 100 MG tablet Take 100 mg by mouth daily.   Multiple Vitamins-Minerals (MULTIVITAMIN ADULT PO) Take 1 tablet by mouth daily.   nystatin-triamcinolone (MYCOLOG II) cream Apply topically 2 (two) times daily.   [DISCONTINUED] losartan (COZAAR) 50 MG tablet Take 50 mg by mouth daily.     Allergies:   Patient has no known allergies.   Social History   Socioeconomic History   Marital status: Married    Spouse name: Not on file   Number of children: Not on file   Years of education: Not on file   Highest education level: Not on file  Occupational History   Not on file  Tobacco Use   Smoking status: Former    Types: Cigarettes   Smokeless tobacco: Never   Tobacco comments:    15-20 years ago  Vaping Use   Vaping Use: Never used  Substance and Sexual Activity   Alcohol use: No   Drug use: No   Sexual activity: Not on file  Other Topics Concern   Not on file  Social History Narrative   Tobacco use   Cigarettes: Never smoked    Tobacco history last updated 01/23/2014   No smoking   No tobacco exposure   No alcohol   Caffeine : yes soda   No recreational drug use   Occupation :employed ,[employer] 01/25/2013   Martial status : single    Children: Louie Casa 01/25/2013   24 year old son   Social Determinants of Health   Financial Resource Strain: Not on file  Food Insecurity: Not on file  Transportation Needs: Not on file  Physical Activity: Not on file  Stress: Not on file  Social Connections: Not on file     Family History: The patient's family history includes Breast cancer in her maternal grandmother and other family members; Breast cancer (age of onset: 19) in her paternal aunt; Breast cancer (age of onset: 72) in her maternal aunt; Heart attack in her maternal grandfather; Hypertension in her mother; Leukemia in her paternal uncle.  ROS:   Please see the history of present illness.    (+) Shortness of breath (+) "Pulling" chest  pain All other systems reviewed and are negative.  EKGs/Labs/Other Studies Reviewed:    TTE 02/15/2018: - Left ventricle: The cavity size was normal. Wall thickness was    increased in a pattern of mild LVH. Systolic function was normal.    The estimated ejection fraction was in the range of 60% to 65%.    Wall motion was normal; there were no regional wall motion    abnormalities. Doppler parameters are consistent with abnormal    left ventricular relaxation (grade 1 diastolic dysfunction).  - Impressions: GLS -19.9%. LS&' 9.8 cm/s.   EKG:   05/07/2021: Sinus rhythm. Rate 74 bpm. LVH.  Recent Labs: 04/19/2021: ALT 17; BUN 15; Creatinine, Ser 0.74; Hemoglobin 11.6; Platelets  336; Potassium 4.4; Sodium 141   Recent Lipid Panel No results found for: CHOL, TRIG, HDL, CHOLHDL, VLDL, LDLCALC, LDLDIRECT  Physical Exam:   VS:  BP 120/80 (BP Location: Right Arm, Patient Position: Sitting)   Pulse 74   Ht '5\' 6"'$  (1.676 m)   Wt 199 lb 6.4 oz (90.4 kg)   BMI 32.18 kg/m  , BMI Body mass index is 32.18 kg/m. GENERAL:  Well appearing HEENT: Pupils equal round and reactive, fundi not visualized, oral mucosa unremarkable NECK:  No jugular venous distention, waveform within normal limits, carotid upstroke brisk and symmetric, no bruits LUNGS:  Clear to auscultation bilaterally HEART:  RRR.  PMI not displaced or sustained,S1 and S2 within normal limits, no S3, no S4, no clicks, no rubs, no murmurs ABD:  Flat, positive bowel sounds normal in frequency in pitch, no bruits, no rebound, no guarding, no midline pulsatile mass, no hepatomegaly, no splenomegaly EXT:  2 plus pulses throughout, no edema, no cyanosis no clubbing SKIN:  No rashes no nodules NEURO:  Cranial nerves II through XII grossly intact, motor grossly intact throughout PSYCH:  Cognitively intact, oriented to person place and time   ASSESSMENT/PLAN:    Essential (primary) hypertension BP was initially elevated but better on repeat.   We will continue losartan/HCTZ for now.  She is going to work on increasing her exercise to at least 150 minutes weekly.  We will refer her to the PR EP program to the [facility].  She will also work on limiting sodium to no more than 1500 to 2000 mg daily.  Her blood pressure remains uncontrolled as she is checking it at home, would consider switching losartan to valsartan.   Screening for Secondary Hypertension:  Causes 05/07/2021  Drugs/Herbals Screened     - Comments caffeine, sodium  Renovascular HTN N/A  Sleep Apnea Screened     - Comments snores.  no apea or daytime somnolence  Thyroid Disease Screened  Hyperaldosteronism N/A  Pheochromocytoma N/A  Cushing's Syndrome N/A  Hyperparathyroidism N/A  Coarctation of the Aorta Screened     - Comments unable to check BP bilaterally 2/2 s/p mastectomy and LN removal  Compliance Screened    Relevant Labs/Studies: Basic Labs Latest Ref Rng & Units 04/19/2021 10/22/2020 04/19/2020  Sodium 135 - 145 mmol/L 141 139 141  Potassium 3.5 - 5.1 mmol/L 4.4 2.9(L) 3.4(L)  Creatinine 0.44 - 1.00 mg/dL 0.74 0.69 0.72                   Disposition:    FU with Tiffany C. Oval Linsey, MD, [facility] in 3-4 months.   Medication Adjustments/Labs and Tests Ordered: Current medicines are reviewed at length with the patient today.  Concerns regarding medicines are outlined above.   Orders Placed This Encounter  Procedures   EKG 12-Lead    No orders of the defined types were placed in this encounter.  I,Mathew Stumpf,acting as a Education administrator for Skeet Latch, MD.,have documented all relevant documentation on the behalf of Skeet Latch, MD,as directed by  Skeet Latch, MD while in the presence of Skeet Latch, MD.  I, [place] Oval Linsey, MD have reviewed all documentation for this visit.  The documentation of the exam, diagnosis, procedures, and orders on 05/07/2021 are all accurate and complete.   Signed, Skeet Latch, MD  05/07/2021 4:16 PM     [facility]

## 2021-05-08 ENCOUNTER — Telehealth: Payer: Self-pay | Admitting: Cardiovascular Disease

## 2021-05-08 DIAGNOSIS — I10 Essential (primary) hypertension: Secondary | ICD-10-CM | POA: Diagnosis not present

## 2021-05-08 DIAGNOSIS — R1030 Lower abdominal pain, unspecified: Secondary | ICD-10-CM | POA: Diagnosis not present

## 2021-05-08 DIAGNOSIS — E782 Mixed hyperlipidemia: Secondary | ICD-10-CM | POA: Diagnosis not present

## 2021-05-08 DIAGNOSIS — F418 Other specified anxiety disorders: Secondary | ICD-10-CM | POA: Diagnosis not present

## 2021-05-08 DIAGNOSIS — E669 Obesity, unspecified: Secondary | ICD-10-CM | POA: Diagnosis not present

## 2021-05-08 NOTE — Telephone Encounter (Signed)
Spoke to patient advised I will send message to [place].

## 2021-05-08 NOTE — Telephone Encounter (Signed)
Briana Price is calling stating she spoke with her mother and found out her maternal grandmother and paternal grandfather both had heart attacks and passed from it. She also states hypertension runs on her mothers side of family. These are things she did not discuss with Dr. Oval Linsey at her recent appointment and wanted to make her aware incase this caused a change in the plan for her heart care. Please advise.

## 2021-05-10 ENCOUNTER — Telehealth: Payer: Self-pay

## 2021-05-10 NOTE — Telephone Encounter (Signed)
Called to discuss PREP program referral; she wants to participate in next afternoon class at [facility], placed on waiting list and will ask Winifred Olive, RN [facility] to contact her when next classes will be scheduled to start later this month at [facility]

## 2021-05-14 NOTE — Telephone Encounter (Signed)
Golden Hurter D, LPN at [id number] AM  Status: Signed  Spoke to patient advised I will send message to [place] patient, verbalized understanding

## 2021-05-21 ENCOUNTER — Telehealth: Payer: Self-pay

## 2021-05-21 ENCOUNTER — Telehealth: Payer: Self-pay | Admitting: Cardiovascular Disease

## 2021-05-21 NOTE — Telephone Encounter (Signed)
Patient is following up regarding Prep program referral. She would like to know who to contact for an update. I informed her, per 09/02 encounter, looks like she has been placed on the wait-list she she will be contacted when next classes are scheduled. She states she was never informed she would be added to the wait-list. Is anyone able to provide an update for her?  I wasn't able to message RN with [facility] for advisement.

## 2021-05-21 NOTE — Telephone Encounter (Signed)
No contact number available  Message sent to Zacarias Pontes and Pam Left message for patient to call back

## 2021-05-21 NOTE — Telephone Encounter (Signed)
Call to pt. Asking when PREP will start.  Explained end of [place] of Oct.  Scheduled intake for Sept 26th at 4pm at the [facility]. Confirmed can do afternoon class. Does work during daytime but can make it work.  Confirmed she has my number for call back.

## 2021-05-23 NOTE — Telephone Encounter (Signed)
Pam has reached out to patient

## 2021-06-04 NOTE — Progress Notes (Signed)
Met with pt last PM to do intake for PREP class starting 06/10/21 at [facility] After discussing schedule-has decided would be best to do an evening class so she would not have to miss time from work.  Okay to start in [place] week after thanksgiving T/TH 615p-730p Will call her back when closer to class start to redo VS and measurements.  Sent info to pt via email as well.

## 2021-06-12 ENCOUNTER — Telehealth: Payer: Self-pay

## 2021-06-18 ENCOUNTER — Encounter: Payer: Self-pay | Admitting: Hematology

## 2021-06-18 NOTE — Telephone Encounter (Signed)
Chart Review only.  No changes made.

## 2021-06-21 ENCOUNTER — Telehealth: Payer: Self-pay

## 2021-06-21 NOTE — Telephone Encounter (Signed)
Notified Patient of completion of FMLA forms. Copy placed at Registration desk for pick-up and copy emailed as requested.

## 2021-07-26 ENCOUNTER — Telehealth: Payer: Self-pay

## 2021-07-26 NOTE — Telephone Encounter (Signed)
Call to patient reference PREP class  Interested in participating in the evening class starting on 08/20/21 T/TH 615p-730p Initial assessment scheduled for 12/6 at 530p

## 2021-08-09 ENCOUNTER — Telehealth: Payer: Self-pay

## 2021-08-09 NOTE — Telephone Encounter (Signed)
Called to move intake appt. Pt can do 12/7 at 530pm.

## 2021-08-14 NOTE — Progress Notes (Signed)
[facility] PREP Evaluation  Patient Details  Name: Briana Price MRN: [id number] Date of Birth: 1965-09-29 Age: 55 y.o. PCP: [name], MD  Vitals:   08/14/21 1800  BP: (!) 162/92  Pulse: 75  SpO2: 99%  Weight: 203 lb 6.4 oz (92.3 kg)     [facility] Eval - 08/14/21 1800       [facility] "PREP" Location   [facility] "PREP" Location [facility]      Referral    Referring Provider Oval Linsey    Reason for referral Hypertension    Program Start Date 08/20/21   T/TH 615p-730p x 12 wks     Measurement   Waist Circumference 43 inches    Hip Circumference 44 inches    Body fat 32.8 percent      Information for Trainer   Goals Get healthy, BP control, get off meds, weight goal 150    Current Exercise walks intermittently    Orthopedic Concerns Knees, LBP    Pertinent Medical History HTN, hx of breast cancer, anemia    Medications that affect exercise Medication causing dizziness/drowsiness      Timed Up and Go (TUGS)   Timed Up and Go Low risk <9 seconds      Mobility and Daily Activities   I find it easy to walk up or down two or more flights of stairs. 2    I have no trouble taking out the trash. 4    I do housework such as vacuuming and dusting on my own without difficulty. 4    I can easily lift a gallon of milk (8lbs). 4    I can easily walk a mile. 2    I have no trouble reaching into high cupboards or reaching down to pick up something from the floor. 4    I do not have trouble doing out-door work such as Armed forces logistics/support/administrative officer, raking leaves, or gardening. 4      Mobility and Daily Activities   I feel younger than my age. 2    I feel independent. 4    I feel energetic. 2    I live an active life.  2    I feel strong. 2    I feel healthy. 1    I feel active as other people my age. 1      How fit and strong are you.   Fit and Strong Total Score 38            Past Medical History:  Diagnosis Date   Anemia    Arthritis    Breast cancer ([place])    Cancer ([place])    left  breast cancer   Family history of breast cancer    Genetic testing 05/28/2017   Common Cancers panel (46 genes) @ [facility] - No pathogenic mutations detected   GERD (gastroesophageal reflux disease)    Headache    Migraines   Hypertension    Non-healing surgical wound    right chest wall   Past Surgical History:  Procedure Laterality Date   BREAST SURGERY     biopsy   CESAREAN SECTION     CHOLECYSTECTOMY N/A 02/07/2013   Procedure: LAPAROSCOPIC CHOLECYSTECTOMY WITH INTRAOPERATIVE CHOLANGIOGRAM;  Surgeon: Gwenyth Ober, MD;  Location: [place];  Service: General;  Laterality: N/A;   COLONOSCOPY W/ BIOPSIES AND POLYPECTOMY     COLONOSCOPY W/ BIOPSIES AND POLYPECTOMY     EXCISION OF BREAST LESION Right 06/08/2019   Procedure:  EXCISION OF NON HEALING WOUND RIGHT CHEST;  Surgeon: [name], MD;  Location: [place];  Service: General;  Laterality: Right;   FOOT SURGERY  2003   lt foot bunionectomy   MASTECTOMY W/ SENTINEL NODE BIOPSY Bilateral 11/19/2017   Procedure: BILATERAL MASTECTOMIES WITH LEFT SENTINEL LYMPH NODE BIOPSY;  Surgeon: [name], MD;  Location: [place];  Service: General;  Laterality: Bilateral;   MULTIPLE TOOTH EXTRACTIONS     PORTACATH PLACEMENT Right 05/19/2017   Procedure: INSERTION PORT-A-CATH;  Surgeon: [name], MD;  Location: [place];  Service: General;  Laterality: Right;   Social History   Tobacco Use  Smoking Status Former   Types: Cigarettes  Smokeless Tobacco Never  Tobacco Comments   15-20 years ago    Barnett Hatter 08/14/2021, 6:04 PM

## 2021-08-19 DIAGNOSIS — H00029 Hordeolum internum unspecified eye, unspecified eyelid: Secondary | ICD-10-CM | POA: Diagnosis not present

## 2021-08-19 DIAGNOSIS — H16141 Punctate keratitis, right eye: Secondary | ICD-10-CM | POA: Diagnosis not present

## 2021-08-19 DIAGNOSIS — H538 Other visual disturbances: Secondary | ICD-10-CM | POA: Diagnosis not present

## 2021-08-28 NOTE — Progress Notes (Signed)
[facility] PREP Weekly Session  Patient Details  Name: Briana Price MRN: [id number] Date of Birth: [birth year] Age: 55 y.o. PCP: [name], MD  Vitals:   08/27/21 1041  Weight: 198 lb 12.8 oz (90.2 kg)     [facility] Weekly seesion - 08/28/21 1000       [facility] "PREP" Location   [facility] "PREP" Location [facility]      Weekly Session   Topic Discussed Healthy eating tips    Minutes exercised this week 30 minutes    Classes attended to date 3           Class held on 08/27/21   Barnett Hatter 08/28/2021, 10:42 AM

## 2021-09-11 NOTE — Progress Notes (Signed)
[facility] PREP Weekly Session  Patient Details  Name: Briana Price MRN: [id number] Date of Birth: 1966-07-21 Age: 56 y.o. PCP: [name], MD  Vitals:   09/10/21 1830  Weight: 204 lb 6.4 oz (92.7 kg)     [facility] Weekly seesion - 09/11/21 1000       [facility] "PREP" Location   [facility] "PREP" Location [facility]      Weekly Session   Topic Discussed Healthy eating tips   part 2, sugar demo   Minutes exercised this week 62 minutes    Classes attended to date 6            Class held on 09/10/21 Barnett Hatter 09/11/2021, 10:43 AM

## 2021-09-12 ENCOUNTER — Other Ambulatory Visit: Payer: Self-pay

## 2021-09-12 ENCOUNTER — Encounter (HOSPITAL_BASED_OUTPATIENT_CLINIC_OR_DEPARTMENT_OTHER): Payer: Self-pay | Admitting: *Deleted

## 2021-09-12 ENCOUNTER — Ambulatory Visit (INDEPENDENT_AMBULATORY_CARE_PROVIDER_SITE_OTHER): Payer: Federal, State, Local not specified - PPO | Admitting: Cardiovascular Disease

## 2021-09-12 ENCOUNTER — Encounter (HOSPITAL_BASED_OUTPATIENT_CLINIC_OR_DEPARTMENT_OTHER): Payer: Self-pay | Admitting: Cardiovascular Disease

## 2021-09-12 DIAGNOSIS — E782 Mixed hyperlipidemia: Secondary | ICD-10-CM | POA: Diagnosis not present

## 2021-09-12 DIAGNOSIS — L659 Nonscarring hair loss, unspecified: Secondary | ICD-10-CM | POA: Diagnosis not present

## 2021-09-12 DIAGNOSIS — I10 Essential (primary) hypertension: Secondary | ICD-10-CM

## 2021-09-12 DIAGNOSIS — Z006 Encounter for examination for normal comparison and control in clinical research program: Secondary | ICD-10-CM

## 2021-09-12 HISTORY — DX: Nonscarring hair loss, unspecified: L65.9

## 2021-09-12 MED ORDER — AMLODIPINE BESYLATE 10 MG PO TABS: 10.0000 mg | ORAL_TABLET | Freq: Every day | ORAL | 3 refills | Status: DC

## 2021-09-12 NOTE — Assessment & Plan Note (Signed)
May have been triggered by damage after wearing braids.  We will also check a TSH.  No clear medication causes.

## 2021-09-12 NOTE — Research (Signed)
Subject Name: Briana Price  [name] met inclusion and exclusion criteria for the Virtual Care and Social Determinant Interventions for the management of hypertension trial.  The informed consent form, study requirements and expectations were reviewed with the subject by Dr. Oval Linsey and myself. The subject was given the opportunity to read the consent and ask questions. The subject verbalized understanding of the trial requirements.  All questions were addressed prior to the signing of the consent form. The subject agreed to participate in the trial and signed the informed consent. The informed consent was obtained prior to performance of any protocol-specific procedures for the subject.  A copy of the signed informed consent was given to the subject and a copy was placed in the subject's medical record.  [name] was randomized to Group 2.

## 2021-09-12 NOTE — Addendum Note (Signed)
Addended by: Skeet Latch C on: 09/12/2021 05:02 PM   Modules accepted: Orders

## 2021-09-12 NOTE — Progress Notes (Signed)
Advanced Hypertension Clinic Follow-up:    Date:  09/12/2021   ID:  Briana Price, Briana Price May 08, 1966, MRN [id number]  PCP:  Briana Caraway, MD  Cardiologist:  None  Nephrologist:  Referring MD: Briana Caraway, MD   CC: Hypertension  History of Present Illness:    Briana Price is a 56 y.o. female with a hx of anemia, arthritis, breast cancer, GERD, and hypertension, here for follow-up. She initially established care in the [facility] 05/07/2021. She saw Dr. Garwin Price on 01/2021 and her blood pressure was 138/98 on HCTZ and Losartan, so she was referred to advanced hypertension clinic. She has a history of breast cancer and underwent bilateral mastectomy. She saw oncology 04/2021 and her blood pressure was well controlled.  At her last appointment, her blood pressure averaged 601-093A systolic at home. She also reported a few episodes of a "pulling," positional chest pain randomly at rest. She also had some dyspnea when climbing inclines or stairs. She was referred for the PREP program and advised to limit sodium to 1500-2000 mg daily. Today, she is feeling about the same overall. She still feels fatigued, and has been trying to walk regularly. She began nighttime classes in the PREP program shortly after Thanksgiving. Currently she is having difficulties with staying motivated. Her activity is also being limited by issues/pain with her left knee. She stopped checking her blood pressure in November. Previously her blood pressure was ranging in the 110s-170s and as high as 200s. Normally she takes all of her medications at one time in the morning. Her diet often consists of fish and vegetables. She has successfully cut back on sweets and Pepsi, and continues to work on this. Additionally, she complains of thinning hair with onset in 11/2020, and is unsure if this is due to one of her medications or protein treatments. Of note, she recently tested positive for COVID but was  asymptomatic. She denies any palpitations, chest pain, or shortness of breath. No lightheadedness, headaches, syncope, orthopnea, PND, lower extremity edema or exertional symptoms.  Previous Antihypertensives: Lisinopril  Past Medical History:  Diagnosis Date   Anemia    Arthritis    Breast cancer ([place])    Cancer ([place])    left breast cancer   Family history of breast cancer    Genetic testing 05/28/2017   Common Cancers panel (46 genes) @ [facility] - No pathogenic mutations detected   GERD (gastroesophageal reflux disease)    Hair loss 09/12/2021   Headache    Migraines   Hypertension    Non-healing surgical wound    right chest wall    Past Surgical History:  Procedure Laterality Date   BREAST SURGERY     biopsy   CESAREAN SECTION     CHOLECYSTECTOMY N/A 02/07/2013   Procedure: LAPAROSCOPIC CHOLECYSTECTOMY WITH INTRAOPERATIVE CHOLANGIOGRAM;  Surgeon: Briana Ober, MD;  Location: [place];  Service: General;  Laterality: N/A;   COLONOSCOPY W/ BIOPSIES AND POLYPECTOMY     COLONOSCOPY W/ BIOPSIES AND POLYPECTOMY     EXCISION OF BREAST LESION Right 06/08/2019   Procedure: EXCISION OF NON HEALING WOUND RIGHT CHEST;  Surgeon: Briana Klein, MD;  Location: [place];  Service: General;  Laterality: Right;   FOOT SURGERY  2003   lt foot bunionectomy   MASTECTOMY W/ SENTINEL NODE BIOPSY Bilateral 11/19/2017   Procedure: BILATERAL MASTECTOMIES WITH LEFT SENTINEL LYMPH NODE BIOPSY;  Surgeon: Briana Klein, MD;  Location: [place];  Service: General;  Laterality: Bilateral;   MULTIPLE TOOTH EXTRACTIONS     PORTACATH PLACEMENT Right 05/19/2017   Procedure: INSERTION PORT-A-CATH;  Surgeon: Briana Klein, MD;  Location: [place] [place];  Service: General;  Laterality: Right;    Current Medications: Current Meds  Medication Sig   acetaminophen (TYLENOL) 500 MG tablet Take 1,000 mg by mouth every 8 (eight) hours as needed for moderate pain or headache.   amLODipine  (NORVASC) 10 MG tablet Take 1 tablet (10 mg total) by mouth daily.   Ascorbic Acid (VITAMIN C) 1000 MG tablet Take 1,000 mg by mouth daily.   atorvastatin (LIPITOR) 20 MG tablet Take 1 tablet by mouth daily.   BIOTIN PO Take 100 mg by mouth daily.   hydrochlorothiazide (HYDRODIURIL) 25 MG tablet Take 1 tablet by mouth daily.   Multiple Vitamins-Minerals (MULTIVITAMIN ADULT PO) Take 1 tablet by mouth daily.   [DISCONTINUED] losartan (COZAAR) 100 MG tablet Take 100 mg by mouth daily.   [DISCONTINUED] nystatin-triamcinolone (MYCOLOG II) cream Apply topically 2 (two) times daily.     Allergies:   Patient has no known allergies.   Social History   Socioeconomic History   Marital status: Married    Spouse name: Not on file   Number of children: Not on file   Years of education: Not on file   Highest education level: Not on file  Occupational History   Not on file  Tobacco Use   Smoking status: Former    Types: Cigarettes   Smokeless tobacco: Never   Tobacco comments:    15-20 years ago  Vaping Use   Vaping Use: Never used  Substance and Sexual Activity   Alcohol use: No   Drug use: No   Sexual activity: Not on file  Other Topics Concern   Not on file  Social History Narrative   Tobacco use   Cigarettes: Never smoked    Tobacco history last updated 01/23/2014   No smoking   No tobacco exposure   No alcohol   Caffeine : yes soda   No recreational drug use   Occupation :employed ,Briana Price 01/25/2013   Martial status : single    Children: Briana Price 01/25/2013   35 year old son   Social Determinants of Health   Financial Resource Strain: Not on file  Food Insecurity: Not on file  Transportation Needs: Not on file  Physical Activity: Not on file  Stress: Not on file  Social Connections: Not on file     Family History: The patient's family history includes Breast cancer in her maternal grandmother and other family members; Breast cancer (age of onset: 59) in her  paternal aunt; Breast cancer (age of onset: 63) in her maternal aunt; Heart attack in her maternal grandfather; Hypertension in her mother; Leukemia in her paternal uncle.  ROS:   Please see the history of present illness.    (+) Fatigue (+) Left knee pain (+) Thinning hair All other systems reviewed and are negative.  EKGs/Labs/Other Studies Reviewed:    TTE 02/15/2018: - Left ventricle: The cavity size was normal. Wall thickness was    increased in a pattern of mild LVH. Systolic function was normal.    The estimated ejection fraction was in the range of 60% to 65%.    Wall motion was normal; there were no regional wall motion    abnormalities. Doppler parameters are consistent with abnormal    left ventricular relaxation (grade 1 diastolic dysfunction).  - Impressions: GLS -19.9%. LS&'  9.8 cm/s.   CT Chest 12/28/2017: COMPARISON:  05/15/2017   FINDINGS: CT CHEST FINDINGS   Cardiovascular: No acute findings.   Mediastinum/Lymph Nodes: No masses or pathologically enlarged lymph nodes identified. Patient has undergone bilateral mastectomies and left axillary lymph node dissection since previous study. No evidence of axillary lymphadenopathy.   Lungs/Pleura: No pulmonary infiltrate or mass identified. Stable tiny 1-2 mm perifissural right lung nodule, consistent with intrapulmonary lymph node. No new or enlarging pulmonary nodules identified. No effusion present.   Musculoskeletal:  No suspicious bone lesions identified.   CT ABDOMEN AND PELVIS FINDINGS   Hepatobiliary: No masses identified. Prior cholecystectomy. No evidence of biliary obstruction.   Pancreas:  No mass or inflammatory changes.   Spleen: No evidence of splenomegaly. Subcapsular cyst measuring 8 mm is stable. No other splenic lesions identified.   Adrenals/Urinary tract:  No masses or hydronephrosis.   Stomach/Bowel: No evidence of obstruction, inflammatory process, or abnormal fluid collections.  Normal appendix visualized. Colonic diverticulosis again noted, without evidence of diverticulitis.   Vascular/Lymphatic: No pathologically enlarged lymph nodes identified. No abdominal aortic aneurysm. Congenital duplication of IVC again noted.   Reproductive: 3.5 cm right uterine fibroid. Adnexal regions are unremarkable.   Other:  None.   Musculoskeletal:  No suspicious bone lesions identified.   IMPRESSION: No evidence of recurrent or metastatic carcinoma. No other acute findings.   3.5 cm right uterine fibroid.   Colonic diverticulosis, without radiographic evidence of diverticulitis.  EKG:   09/12/2021: EKG was not ordered. 05/07/2021: Sinus rhythm. Rate 74 bpm. LVH.  Recent Labs: 04/19/2021: ALT 17; BUN 15; Creatinine, Ser 0.74; Hemoglobin 11.6; Platelets 336; Potassium 4.4; Sodium 141   Recent Lipid Panel No results found for: CHOL, TRIG, HDL, CHOLHDL, VLDL, LDLCALC, LDLDIRECT  Physical Exam:    VS:  BP 138/82 (BP Location: Right Arm, Patient Position: Sitting, Cuff Size: Large)    Pulse 77    Ht 5\' 6"  (1.676 m)    Wt 204 lb 4.8 oz (92.7 kg)    SpO2 97%    BMI 32.97 kg/m  , BMI Body mass index is 32.97 kg/m. GENERAL:  Well appearing HEENT: Pupils equal round and reactive, fundi not visualized, oral mucosa unremarkable NECK:  No jugular venous distention, waveform within normal limits, carotid upstroke brisk and symmetric, no bruits LUNGS:  Clear to auscultation bilaterally HEART:  RRR.  PMI not displaced or sustained,S1 and S2 within normal limits, no S3, no S4, no clicks, no rubs, no murmurs ABD:  Flat, positive bowel sounds normal in frequency in pitch, no bruits, no rebound, no guarding, no midline pulsatile mass, no hepatomegaly, no splenomegaly EXT:  2 plus pulses throughout, no edema, no cyanosis no clubbing SKIN:  No rashes no nodules NEURO:  Cranial nerves II through XII grossly intact, motor grossly intact throughout PSYCH:  Cognitively intact, oriented to  person place and time   ASSESSMENT/PLAN:    Essential (primary) hypertension Her blood pressures been very poorly controlled both here and at home.  She is going to work on getting back into her exercise.  Continue working in the [employer].  We are going to stop losartan and start amlodipine.  She will start 10 mg a day.  For the first 2 days she will continue taking the losartan and then discontinue it on Sunday.  Continue hydrochlorothiazide.  She is interested in enrolling in our remote patient monitoring study and consents to monitoring in the [place] remote patient monitoring system.  Hair loss  May have been triggered by damage after wearing braids.  We will also check a TSH.  No clear medication causes.  Mixed hyperlipidemia Continue atorvastatin.    Screening for Secondary Hypertension:  Causes 05/07/2021  Drugs/Herbals Screened     - Comments caffeine, sodium  Renovascular HTN N/A  Sleep Apnea Screened     - Comments snores.  no apea or daytime somnolence  Thyroid Disease Screened  Hyperaldosteronism N/A  Pheochromocytoma N/A  Cushing's Syndrome N/A  Hyperparathyroidism N/A  Coarctation of the Aorta Screened     - Comments unable to check BP bilaterally 2/2 s/p mastectomy and LN removal  Compliance Screened    Relevant Labs/Studies: Basic Labs Latest Ref Rng & Units 04/19/2021 10/22/2020 04/19/2020  Sodium 135 - 145 mmol/L 141 139 141  Potassium 3.5 - 5.1 mmol/L 4.4 2.9(L) 3.4(L)  Creatinine 0.44 - 1.00 mg/dL 0.74 0.69 0.72       Disposition:    FU with Emiyah Spraggins C. Oval Linsey, MD, [facility] in 4 months.   Medication Adjustments/Labs and Tests Ordered: Current medicines are reviewed at length with the patient today.  Concerns regarding medicines are outlined above.   Orders Placed This Encounter  Procedures   TSH   Meds ordered this encounter  Medications   amLODipine (NORVASC) 10 MG tablet    Sig: Take 1 tablet (10 mg total) by mouth daily.    Dispense:  90 tablet     Refill:  3   I,Mathew Stumpf,acting as a scribe for Skeet Latch, MD.,have documented all relevant documentation on the behalf of Skeet Latch, MD,as directed by  Skeet Latch, MD while in the presence of Skeet Latch, MD.  I, [place] Oval Linsey, MD have reviewed all documentation for this visit.  The documentation of the exam, diagnosis, procedures, and orders on 09/12/2021 are all accurate and complete.  Signed, Skeet Latch, MD  09/12/2021 2:42 PM    [facility]

## 2021-09-12 NOTE — Patient Instructions (Signed)
Medication Instructions:  STOP LOSARTAN Sunday   START AMLODIPINE 10 MG DAILY   Labwork: TSH TODAY   Testing/Procedures: NONE  Follow-Up: 11/01/2021 3:00 pm with Pharm D  12/31/2021 3:00 pm with Dr Oval Linsey   Any Other Special Instructions Will Be Listed Below (If Applicable).  MONITOR YOU BLOOD PRESSURE TWICE A DAY WITH MACHINE PROVIDED. MAKE SURE YOU ARE LOGGED INTO YOUR APP WHEN CHECKING   If you need a refill on your cardiac medications before your next appointment, please call your pharmacy.

## 2021-09-12 NOTE — Assessment & Plan Note (Signed)
Continue atorvastatin

## 2021-09-12 NOTE — Assessment & Plan Note (Signed)
Her blood pressures been very poorly controlled both here and at home.  She is going to work on getting back into her exercise.  Continue working in the [employer].  We are going to stop losartan and start amlodipine.  She will start 10 mg a day.  For the first 2 days she will continue taking the losartan and then discontinue it on Sunday.  Continue hydrochlorothiazide.  She is interested in enrolling in our remote patient monitoring study and consents to monitoring in the [place] remote patient monitoring system.

## 2021-09-13 LAB — TSH: TSH: 0.928 u[IU]/mL (ref 0.450–4.500)

## 2021-09-16 ENCOUNTER — Encounter: Payer: Self-pay | Admitting: Hematology

## 2021-09-16 ENCOUNTER — Telehealth: Payer: Self-pay

## 2021-09-16 DIAGNOSIS — Z0000 Encounter for general adult medical examination without abnormal findings: Secondary | ICD-10-CM

## 2021-09-16 NOTE — Telephone Encounter (Signed)
Called patient to discuss health coaching per survey responses in [place] regarding not having an eating plan and having difficulties with sleep. Patient expressed that she has always had issues with sleeping and is not sure what can be done about that. Patient expressed that she has issues with sodas and cakes that she is trying to remove from her diet. Patient stated that in the PREP program, she is learning various information regarding nutrition and how the instructor is encouraging her to make dietary changes.   Patient is interested in health coaching for her eating habits and have been scheduled for her initial health coaching session on 1/12 at 2:30pm. Patient will be called at this time.    Briana Price, [facility] Guide, Health Coach [address], [address] [place] [address] Telephone: [phone] Email: [email or web address]

## 2021-09-18 NOTE — Progress Notes (Signed)
[facility] PREP Weekly Session  Patient Details  Name: Briana Price MRN: [id number] Date of Birth: 05-Sep-1966 Age: 56 y.o. PCP: [name], MD  Vitals:   09/17/21 1830  Weight: 202 lb (91.6 kg)     [facility] Weekly seesion - 09/18/21 1000       [facility] "PREP" Location   [facility] "PREP" Location [facility]      Weekly Session   Topic Discussed Health habits    Minutes exercised this week 90 minutes    Classes attended to date 8           Class held on 09/17/21  Barnett Hatter 09/18/2021, 10:14 AM

## 2021-09-19 ENCOUNTER — Other Ambulatory Visit: Payer: Self-pay

## 2021-09-19 ENCOUNTER — Telehealth: Payer: Self-pay

## 2021-09-19 ENCOUNTER — Ambulatory Visit (INDEPENDENT_AMBULATORY_CARE_PROVIDER_SITE_OTHER): Payer: Federal, State, Local not specified - PPO

## 2021-09-19 DIAGNOSIS — Z0000 Encounter for general adult medical examination without abnormal findings: Secondary | ICD-10-CM

## 2021-09-19 NOTE — Progress Notes (Signed)
Appointment Outcome: Completed, Session #: Initial health coaching session Start time: 2:58pm   End time: 3:30pm   Total Mins: 32 minutes  AGREEMENTS SECTION    Overall Goal(s): Improve healthy eating habits  Increase physical activity                                         Agreement/Action Steps:  Improve healthy eating habits Eat cake/sweets 2-3 times a week Drink mini (10 oz.) can sodas instead of larger volume of soda Determine which healthy snack options to consume Read food labels to monitor consuming 25 g of sugar or less per day Drink 64-100 oz of water per day  Increase physical activity Exercise for 30-40 minutes Attend PREP on T @ Th Walk on Wed around 5:00pm   Progress Notes:  Patient stated that she is interested in stopping soda drinking and eating cakes. Patient is currently participating in [place] until March on Tuesdays and Thursdays. Patient explained that she was exercising 5x/week until she tested positive for Covid in October. Patient stated that getting sick knocked her off her routine.   Patient stated that she was able to make health behavior changes before when she was taking chemo. Patient stated that during those times, food didn't taste right, and it was easy for her not to eat certain foods, but since the taste is normal, she eats a lot of sugar. Patient mentioned that she is not motivated or have the will power to get back into her healthy behavior habits.   Patient shared that she eats cake approximately 5 times a week. Patient mentioned that when she does eat cake from her favorite restaurant, she also purchases a vegetable plate. Patient stated that she doesn't have to have a craving for sugar, but it's more of a habit to eat cake regularly. Patient mentioned that the habit is not as bad, because she used to eat two slices of cake at a time.   Patient mentioned that she has cut back on the amount of soda that she consumes daily. Patient has switched to  10 oz can sodas instead of purchasing 20 oz or 2-liter sodas. Patient stated that she currently drinks 4 - 16.9 oz bottles of water daily. Patient shared that she works from home and can keep water bottles near her. Patient sometimes use flavor packets to encourage her to drink more water.     Coaching Outcomes: Patient is processing her reason(s) for wanting to make these behavior changes. Patient will think about this to aid in her motivation to start and stick with her action steps over the next two weeks.   Patient will be working on deciding on healthy snack options she can consume instead of cake. Patient is considering apples with cinnamon. Patient will be watching her sugar intake by reading food labels. Patient learned in PREP to consume 25 g of sugar or less per day.  Patient is interested in increasing her water consumption to 100 oz per day. Patient will continue to keep water bottles by her during work and decide when she will replace a soda with a bottle of water.   Patient will limit her cake/sweet consumption to 2-3x/week. Patient will start reading food labels to manage the amount of sugar she consumes a day (25 g of sugar or less).   Patient will maintain drinking 10 oz sodas and limit her caffeine  consumption.  Patient will continue to attend PREP as scheduled. Patient will incorporate 30 minutes of walking on Wednesdays approximately around 5pm for 30-40 minutes. Patient will consider using the elliptical or treadmill during this time as an alternative to walking outdoors.   Will talk about conducting self-check-ins and implementing positive self-talk to keep herself motivated.

## 2021-09-19 NOTE — Telephone Encounter (Signed)
Called patient to hold initial health coaching session. Patient is currently in another doctor's appointment and requested to be called back in 30 minutes. Will attempt to reach out to patient again after the time.   Briana Price [place], [facility] Guide, Health Coach [address], [address] [place] [address] Telephone: [phone] Email: [email or web address]

## 2021-09-25 NOTE — Progress Notes (Signed)
[facility] PREP Weekly Session  Patient Details  Name: Briana Price MRN: [id number] Date of Birth: [birth year] Age: 56 y.o. PCP: [name], MD  Vitals:   09/24/21 1830  Weight: 205 lb (93 kg)     [facility] Weekly seesion - 09/25/21 1000       [facility] "PREP" Location   [facility] "PREP" [facility]      Weekly Session   Topic Discussed Restaurant Eating    Minutes exercised this week 105 minutes    Classes attended to date 10            Education: [place] 09/25/2021, 10:28 AM

## 2021-09-30 DIAGNOSIS — I10 Essential (primary) hypertension: Secondary | ICD-10-CM | POA: Diagnosis not present

## 2021-10-02 NOTE — Progress Notes (Signed)
[facility] PREP Weekly Session  Patient Details  Name: Briana Price MRN: [id number] Date of Birth: Jul 29, 1966 Age: 56 y.o. PCP: [name], MD  Vitals:   10/01/21 1830  Weight: 200 lb (90.7 kg)     [facility] Weekly seesion - 10/02/21 1200       [facility] "PREP" Location   [facility] "PREP" [facility]      Weekly Session   Topic Discussed Stress management and problem solving    Minutes exercised this week 45 minutes   under counting for week   Classes attended to date 12            Late entry: class held on 10/01/21 Barnett Hatter 10/02/2021, 12:40 PM

## 2021-10-03 ENCOUNTER — Other Ambulatory Visit: Payer: Self-pay

## 2021-10-03 ENCOUNTER — Ambulatory Visit (INDEPENDENT_AMBULATORY_CARE_PROVIDER_SITE_OTHER): Payer: Federal, State, Local not specified - PPO

## 2021-10-03 DIAGNOSIS — Z0000 Encounter for general adult medical examination without abnormal findings: Secondary | ICD-10-CM

## 2021-10-09 NOTE — Progress Notes (Signed)
Appointment Outcome: Completed, Session #: 1 Start time: 4:33pm   End time: 4:58pm   Total Mins: 25 minutes  AGREEMENTS SECTION     Overall Goal(s): Improve healthy eating habits  Increase physical activity                                          Agreement/Action Steps:  Improve healthy eating habits Eat cake/sweets 2-3 times a week Drink mini (10 oz.) can sodas instead of larger volume of soda Determine which healthy snack options to consume Read food labels to monitor consuming 25 g of sugar or less per day Drink 64-100 oz of water per day   Increase physical activity Exercise for 30-40 minutes Attend PREP on T @ Th Walk on Wed around 5:00pm  Progress Notes:  Patient stated that PREP is going well. Patient shared that sometimes exercising can be difficult because she is getting used to exercising again. Patient stated that she walked Thursday before class. [facility] mentioned that [facility] so that she could walk during that time. Patient stated that she goes to the [facility] early on Tuesdays as well to walk. Patient has not started walking on Wednesdays but expressed that she is going to work this into her schedule.   Patient stated that she has done well with reducing her consumption of cake/sweets over the past two weeks. Patient stated that she has not had any cake this week and purchased two slices of cake the previous week instead of daily. Patient did not eat all of the slice on the same day so that she could practice portion control. Patient expressed that refraining from eating cake regularly throughout the week is a mental choice for her. Patient is trying to stay focused and tells herself that she just has to do what is necessary to meet her goals.  Patient has made some changes to some of the things that she eats. Patient has replaced her old snacks/sweets with various nuts, fresh pineapples, cinnamon apple sauce, Jell-O with pineapple, and a sweet  and sour mix of peanuts, sunflower seeds, craisins, and M&Ms. Patient limited herself during a meal to one piece of fried fish, and homemade coleslaw. Patient shared that she eats one meal a day. Patient stated that she used to meal prep, but now that she works from home, she grabs whatever most times. Patient may eat a spoonful of peanut butter for a quick snack.   Patient stated that she hasn't been reading food labels to monitor sugar intake, but more so for calories. Patient mentioned that she doesn't go grocery shopping al lot. Patient stated that it is easier to pick up some vegetables at a restaurant. Patient stated that she is trying to work on eating out less and cooking more.   Patient stated that she has increased her water intake to at least six bottles of water. Patient has drunk more than six bottles on some days and have been able to maintain drinking 100 oz of water per day. Patient shared that she keeps a case of water by her work desk as a reminder. Patient has been able to drink the mini 10 oz cans of soda. Patient reported that she did drink a 20 oz Dr. Malachi Bonds but haven't struggled with this step.    Indicators of Success and Accountability:  Patient stated that drinking  her water and reducing the amount of cake she eats during the week are her indicators of success and accountability.  Readiness: Patient is in the action phase of increasing physical activity and improving healthy eating habits.  Strengths and Supports: Patient's willpower is her strength. Challenges and Barriers: Patient do not foresee any challenges/barriers to implementing her action steps over the next two weeks.   Coaching Outcomes: Patient will continue to implement her action steps over the next two weeks as outlined above over the next two weeks.   Patient will continue to implement positive self-talk to encourage herself to keep going and reminding herself not to eat certain foods.  Attempted: Fulfilled  - Patient was able to restrict eating cake/sweets, drink 100 oz of water per day, choose healthy snack options, and attend PREP on Tuesdays and Thursdays. Partial - Patient was able to drink 10 oz cans of soda except one day over the past two week and reading food labels for calorie intake instead of sugar consumption. Not met - Patient has not started walking on Wednesdays yet.

## 2021-10-10 NOTE — Progress Notes (Signed)
[facility] PREP Weekly Session  Patient Details  Name: Briana Price MRN: [id number] Date of Birth: June 30, 1966 Age: 56 y.o. PCP: [name], MD  Vitals:   10/08/21 1830  Weight: 200 lb (90.7 kg)     [facility] Weekly seesion - 10/10/21 1600       [facility] "PREP" Location   [facility]PREP[facility] Location [facility]      Weekly Session   Topic Discussed Expectations and non-scale victories    Minutes exercised this week 150 minutes    Classes attended to date 25            Late entry, class on 10/08/21  Barnett Hatter 10/10/2021, 4:56 PM

## 2021-10-11 ENCOUNTER — Telehealth: Payer: Self-pay

## 2021-10-11 DIAGNOSIS — Z0000 Encounter for general adult medical examination without abnormal findings: Secondary | ICD-10-CM

## 2021-10-11 NOTE — Telephone Encounter (Signed)
Called patient to reschedule health coaching session. Patient was rescheduled for 2/15 at 4:30pm. Patient will be called at this time.   Mustafa Potts Truman Hayward, [facility] Guide, Health Coach [address] [place] [address] Telephone: [phone] Email: [email or web address]

## 2021-10-17 ENCOUNTER — Ambulatory Visit: Payer: Federal, State, Local not specified - PPO

## 2021-10-17 NOTE — Progress Notes (Signed)
[facility] PREP Weekly Session  Patient Details  Name: Briana Price MRN: [id number] Date of Birth: 1965-11-26 Age: 56 y.o. PCP: [name], MD  Vitals:   10/15/21 1830  Weight: 196 lb 9.6 oz (89.2 kg)     [facility] Weekly seesion - 10/17/21 1700       [facility] "PREP" Location   [facility]PREP[facility] Location [facility]      Weekly Session   Topic Discussed Other    Minutes exercised this week 90 minutes    Classes attended to date 16            Class held on 10/15/21 Barnett Hatter 10/17/2021, 5:25 PM

## 2021-10-23 ENCOUNTER — Other Ambulatory Visit: Payer: Self-pay

## 2021-10-23 ENCOUNTER — Ambulatory Visit (INDEPENDENT_AMBULATORY_CARE_PROVIDER_SITE_OTHER): Payer: Federal, State, Local not specified - PPO

## 2021-10-23 DIAGNOSIS — Z0000 Encounter for general adult medical examination without abnormal findings: Secondary | ICD-10-CM

## 2021-10-23 NOTE — Progress Notes (Signed)
[facility] PREP Weekly Session  Patient Details  Name: Briana Price MRN: [id number] Date of Birth: 1966-05-19 Age: 56 y.o. PCP: [name], MD  Vitals:   10/22/21 1830  Weight: 198 lb (89.8 kg)     [facility] Weekly seesion - 10/23/21 0900       [facility] "PREP" Location   [facility]P" [facility]      Weekly Session   Topic Discussed Finding support    Minutes exercised this week 150 minutes    Classes attended to date 18            Class held on 10/22/21 Barnett Hatter 10/23/2021, 9:08 AM

## 2021-10-23 NOTE — Progress Notes (Signed)
Appointment Outcome: Completed, Session #: 2 Start time: 4:33pm   End time: 4:58pm   Total Mins: 25 minutes  AGREEMENTS SECTION      Overall Goal(s): Improve healthy eating habits  Increase physical activity                                          Agreement/Action Steps:  Improve healthy eating habits Eat cake/sweets 2-3 times a week Drink mini (10 oz.) can sodas instead of larger volume of soda Determine which healthy snack options to consume Read food labels to monitor consuming 25 g of sugar or less per day Drink 64-100 oz of water per day   Increase physical activity Exercise for 30-40 minutes Attend PREP on T @ Th Walk on Wed around 5:00pm  Progress Notes:  Patient stated that she only had one piece of cake for one week but none during the first week. Patient has been intentional about not going by the location that she purchases her cake slices from to avoid the temptation to stop. Patient stated that she does not go out of her way to get the cake only if she is in the area. Patient had two apple pies and ice one week from [employer] but has made a conscious choice to limit herself from eating this dessert.   Patient has been able to maintain drinking only 10-ounce cans of soda over the past two weeks. Patient reported that she drinks either a Ginger Ale or Pepsi once a day. Patient stated that this step has been easier to implement because she has increased her water consumption. Patient mentioned that she drinks at least 6 bottles of water or more per day.   Patient is eating nuts and fruit, and a healthy snack mix instead of eating a lot of sweets. Patient stated that she is reading food labels to monitor her sugar consumption so that she can stay under 25 mg of sugar per day.   Patient has been attending PREP as scheduled. Patient continues to walk on treadmill before class on Tuesday and Thursday for at least an hour or until her knees started hurting from 5-6pm. Patient  stated that her knee pain has improved recently. Patient walked the first Wednesday for her additional day of exercise but wasn't able to walk on Wednesday. Patient is planning on walking today after eating if the weather permits.   Indicators of Success and Accountability:  Patient stated that consuming the water is her indicator of success and accountability because it is the most water that she has consumed in a long time.  Readiness: Patient is in the action phase of increasing physical activity and improving her healthy eating habits.  Strengths and Supports: Patient is holding herself accountable for implementing her steps as agreed upon.  Challenges and Barriers: Patient may be challenged to walking outside due to the weather.   Coaching Outcomes: Discussed alternative to walking on Wednesdays if the weather does not permit her to walk outside. Patient stated that she has a treadmill and elliptical at home and she must just start implementing that into her routine. Patient would use either machine for 30 minutes at a time because of her knees until she walked for one hour that day. Patient stated that she prefers to walk on the track or the street because it's less impact on her knees.   Attempted: Fulfilled -  Patient was able to maintain not eating cake/sweets more than 2-3 times per week. Patient has maintained drinking only 10-ounce cans of sodas and drink a minimum of 6 (16.9 oz) bottles of water per day. Patient is reading food labels to manage sugar consumption. Patient has been able to attend PREP as scheduled.  Partial - Patient didn't walk on Wednesday last week.

## 2021-10-30 DIAGNOSIS — I10 Essential (primary) hypertension: Secondary | ICD-10-CM | POA: Diagnosis not present

## 2021-10-31 NOTE — Progress Notes (Signed)
[facility] PREP Weekly Session  Patient Details  Name: Briana Price MRN: [id number] Date of Birth: May 30, 1966 Age: 56 y.o. PCP: [name], MD  Vitals:   10/29/21 1830  Weight: 194 lb 12.8 oz (88.4 kg)     [facility] Weekly seesion - 10/31/21 1700       [facility] "PREP" Location   [facility] "PREP" Location [facility]      Weekly Session   Topic Discussed Calorie breakdown    Minutes exercised this week 120 minutes    Classes attended to date 20            Class held on 10/29/21 late entry Barnett Hatter 10/31/2021, 5:04 PM

## 2021-11-01 ENCOUNTER — Other Ambulatory Visit: Payer: Self-pay

## 2021-11-01 ENCOUNTER — Encounter (HOSPITAL_BASED_OUTPATIENT_CLINIC_OR_DEPARTMENT_OTHER): Payer: Self-pay | Admitting: Pharmacist Clinician (PhC)/ Clinical Pharmacy Specialist

## 2021-11-01 ENCOUNTER — Ambulatory Visit (HOSPITAL_BASED_OUTPATIENT_CLINIC_OR_DEPARTMENT_OTHER): Payer: Federal, State, Local not specified - PPO | Admitting: Pharmacist Clinician (PhC)/ Clinical Pharmacy Specialist

## 2021-11-01 VITALS — BP 130/88 | HR 82 | Ht 66.0 in | Wt 197.4 lb

## 2021-11-01 DIAGNOSIS — I10 Essential (primary) hypertension: Secondary | ICD-10-CM | POA: Diagnosis not present

## 2021-11-01 MED ORDER — CHLORTHALIDONE 25 MG PO TABS: 25.0000 mg | ORAL_TABLET | Freq: Every day | ORAL | 3 refills | Status: DC

## 2021-11-01 NOTE — Patient Instructions (Signed)
Return for a a follow up appointment in April with Dr. Oval Linsey  Go to the lab in 2 weeks (week of March 12)  Check your blood pressure at home daily and keep record of the readings.  Take your BP meds as follows:  Stop hydrochlorothiazide.  Start chlorthalidone 25 mg once daily in the mornings  Bring all of your meds, your BP cuff and your record of home blood pressures to your next appointment.  Exercise as youre able, try to walk approximately 30 minutes per day.  Keep salt intake to a minimum, especially watch canned and prepared boxed foods.  Eat more fresh fruits and vegetables and fewer canned items.  Avoid eating in fast food restaurants.    HOW TO TAKE YOUR BLOOD PRESSURE: Rest 5 minutes before taking your blood pressure.  Dont smoke or drink caffeinated beverages for at least 30 minutes before. Take your blood pressure before (not after) you eat. Sit comfortably with your back supported and both feet on the floor (dont cross your legs). Elevate your arm to heart level on a table or a desk. Use the proper sized cuff. It should fit smoothly and snugly around your bare upper arm. There should be enough room to slip a fingertip under the cuff. The bottom edge of the cuff should be 1 inch above the crease of the elbow. Ideally, take 3 measurements at one sitting and record the average.    DASH Eating Plan DASH stands for Dietary Approaches to Stop Hypertension. The DASH eating plan is a healthy eating plan that has been shown to: Reduce high blood pressure (hypertension). Reduce your risk for type 2 diabetes, heart disease, and stroke. Help with weight loss. What are tips for following this plan? Reading food labels Check food labels for the amount of salt (sodium) per serving. Choose foods with less than 5 percent of the Daily Value of sodium. Generally, foods with less than 300 milligrams (mg) of sodium per serving fit into this eating plan. To find whole grains, look for the  word "whole" as the first word in the ingredient list. Shopping Buy products labeled as "low-sodium" or "no salt added." Buy fresh foods. Avoid canned foods and pre-made or frozen meals. Cooking Avoid adding salt when cooking. Use salt-free seasonings or herbs instead of table salt or sea salt. Check with your health care provider or pharmacist before using salt substitutes. Do not fry foods. Cook foods using healthy methods such as baking, boiling, grilling, roasting, and broiling instead. Cook with heart-healthy oils, such as olive, canola, avocado, soybean, or sunflower oil. Meal planning  Eat a balanced diet that includes: 4 or more servings of fruits and 4 or more servings of vegetables each day. Try to fill one-half of your plate with fruits and vegetables. 6-8 servings of whole grains each day. Less than 6 oz (170 g) of lean meat, poultry, or fish each day. A 3-oz (85-g) serving of meat is about the same size as a deck of cards. One egg equals 1 oz (28 g). 2-3 servings of low-fat dairy each day. One serving is 1 cup (237 mL). 1 serving of nuts, seeds, or beans 5 times each week. 2-3 servings of heart-healthy fats. Healthy fats called omega-3 fatty acids are found in foods such as walnuts, flaxseeds, fortified milks, and eggs. These fats are also found in cold-water fish, such as sardines, salmon, and mackerel. Limit how much you eat of: Canned or prepackaged foods. Food that is high in  trans fat, such as some fried foods. Food that is high in saturated fat, such as fatty meat. Desserts and other sweets, sugary drinks, and other foods with added sugar. Full-fat dairy products. Do not salt foods before eating. Do not eat more than 4 egg yolks a week. Try to eat at least 2 vegetarian meals a week. Eat more home-cooked food and less restaurant, buffet, and fast food. Lifestyle When eating at a restaurant, ask that your food be prepared with less salt or no salt, if possible. If you  drink alcohol: Limit how much you use to: 0-1 drink a day for women who are not pregnant. 0-2 drinks a day for men. Be aware of how much alcohol is in your drink. In the [place] one drink equals one 12 oz bottle of beer (355 mL), one 5 oz glass of wine (148 mL), or one 1 oz glass of hard liquor (44 mL). General information Avoid eating more than 2,300 mg of salt a day. If you have hypertension, you may need to reduce your sodium intake to 1,500 mg a day. Work with your health care provider to maintain a healthy body weight or to lose weight. Ask what an ideal weight is for you. Get at least 30 minutes of exercise that causes your heart to beat faster (aerobic exercise) most days of the week. Activities may include walking, swimming, or biking. Work with your health care provider or dietitian to adjust your eating plan to your individual calorie needs. What foods should I eat? Fruits All fresh, dried, or frozen fruit. Canned fruit in natural juice (without added sugar). Vegetables Fresh or frozen vegetables (raw, steamed, roasted, or grilled). Low-sodium or reduced-sodium tomato and vegetable juice. Low-sodium or reduced-sodium tomato sauce and tomato paste. Low-sodium or reduced-sodium canned vegetables. Grains Whole-grain or whole-wheat bread. Whole-grain or whole-wheat pasta. Brown rice. [place]. Bulgur. Whole-grain and low-sodium cereals. Pita bread. Low-fat, low-sodium crackers. Whole-wheat flour tortillas. Meats and other proteins Skinless chicken or [place]. Ground chicken or [place]. Pork with fat trimmed off. Fish and seafood. Egg whites. Dried beans, peas, or lentils. Unsalted nuts, nut butters, and seeds. Unsalted canned beans. Lean cuts of beef with fat trimmed off. Low-sodium, lean precooked or cured meat, such as sausages or meat loaves. Dairy Low-fat (1%) or fat-free (skim) milk. Reduced-fat, low-fat, or fat-free cheeses. Nonfat, low-sodium ricotta or cottage cheese. Low-fat or  nonfat yogurt. Low-fat, low-sodium cheese. Fats and oils Soft margarine without trans fats. Vegetable oil. Reduced-fat, low-fat, or light mayonnaise and salad dressings (reduced-sodium). Canola, safflower, olive, avocado, soybean, and sunflower oils. Avocado. Seasonings and condiments Herbs. Spices. Seasoning mixes without salt. Other foods Unsalted popcorn and pretzels. Fat-free sweets. The items listed above may not be a complete list of foods and beverages you can eat. Contact a dietitian for more information. What foods should I avoid? Fruits Canned fruit in a light or heavy syrup. Fried fruit. Fruit in cream or butter sauce. Vegetables Creamed or fried vegetables. Vegetables in a cheese sauce. Regular canned vegetables (not low-sodium or reduced-sodium). Regular canned tomato sauce and paste (not low-sodium or reduced-sodium). Regular tomato and vegetable juice (not low-sodium or reduced-sodium). Angie Fava. Olives. Grains Baked goods made with fat, such as croissants, muffins, or some breads. Dry pasta or rice meal packs. Meats and other proteins Fatty cuts of meat. Ribs. Fried meat. Berniece Salines. Bologna, salami, and other precooked or cured meats, such as sausages or meat loaves. Fat from the back of a pig (fatback). Bratwurst. Salted nuts  and seeds. Canned beans with added salt. Canned or smoked fish. Whole eggs or egg yolks. Chicken or [place] with skin. Dairy Whole or 2% milk, cream, and half-and-half. Whole or full-fat cream cheese. Whole-fat or sweetened yogurt. Full-fat cheese. Nondairy creamers. Whipped toppings. Processed cheese and cheese spreads. Fats and oils Butter. Stick margarine. Lard. Shortening. Ghee. Bacon fat. Tropical oils, such as coconut, palm kernel, or palm oil. Seasonings and condiments Onion salt, garlic salt, seasoned salt, table salt, and sea salt. Worcestershire sauce. Tartar sauce. Barbecue sauce. Teriyaki sauce. Soy sauce, including reduced-sodium. Steak sauce.  Canned and packaged gravies. Fish sauce. Oyster sauce. Cocktail sauce. Store-bought horseradish. Ketchup. Mustard. Meat flavorings and tenderizers. Bouillon cubes. Hot sauces. Pre-made or packaged marinades. Pre-made or packaged taco seasonings. Relishes. Regular salad dressings. Other foods Salted popcorn and pretzels. The items listed above may not be a complete list of foods and beverages you should avoid. Contact a dietitian for more information. Where to find more information National Heart, Lung, and Blood Institute: https://wilson-eaton.com/ American Heart Association: www.heart.org Academy of Nutrition and Dietetics: www.eatright.[place]: www.kidney.org Summary The DASH eating plan is a healthy eating plan that has been shown to reduce high blood pressure (hypertension). It may also reduce your risk for type 2 diabetes, heart disease, and stroke. When on the DASH eating plan, aim to eat more fresh fruits and vegetables, whole grains, lean proteins, low-fat dairy, and heart-healthy fats. With the DASH eating plan, you should limit salt (sodium) intake to 2,300 mg a day. If you have hypertension, you may need to reduce your sodium intake to 1,500 mg a day. Work with your health care provider or dietitian to adjust your eating plan to your individual calorie needs. This information is not intended to replace advice given to you by your health care provider. Make sure you discuss any questions you have with your health care provider. Document Revised: 07/29/2019 Document Reviewed: 07/29/2019 Elsevier Patient Education  2022 [employer].

## 2021-11-01 NOTE — Assessment & Plan Note (Signed)
Blood pressure is mildly uncontrolled on current regimen. Reports medication adherence and denies any medication-related adverse effects. Discussed modifiable and non-modifiable risk factors for hypertension. BP goal < 130/80.    Stop Hydrochlorothiazide   Start Chlorthalidone 25mg  daily  Continue Amlodipine 10mg  daily   Check BMP in 2 weeks    Recommend DASH diet  Recommend reduced sodium intake (< 1500mg /day)

## 2021-11-01 NOTE — Progress Notes (Signed)
11/01/2021 Briana Price 1965-10-24 [id number]   HPI:     TM is a pleasant 79 YOF patient of Dr. Oval Linsey enrolled in the hypertension study, here today for follow-up management of her hypertension. Initially established care with our clinic 05/07/21 after referral from Dr. Garwin Brothers for office pressures of 138/98 on HCTZ and Losartan in May of 2022.  She was last seen 09/12/21 by Dr. Oval Linsey and had an office BP of 138/82. She also expressed difficulties staying motivated with the PREP program. Her losartan was discontinued and amlodipine 10mg  was started at this visit.    Today, her BP in the clinic is 130/88 and home pressures averaging 127/86 based on Vivify cuff. She reports being adherent to her medications and not missing any doses.   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 She also mentioned chest pain a few days ago that she described as a dull-ache type pain that subsided within 10 minutes.     Labs (04/19/21): SCr 0.74, BUN 15, K 4.4, Na 141    Relevant Past Medical History:  HLD   No lipid panel on file  Atorvastatin 20mg   HTN       Amlodipine, HCTZ  Triple negative breast cancer hair loss anemia  hypercalcemia   Blood Pressure Goal: < 130/80 Home Average (14 day): 127/86, SBP range 113-147, DBP range 74-104  Home Average (30 day): 129/89, SBP range 113-148, DBP range 74-106    FH: HTN in mother; MI in maternal grandfather; breast cancer, MI, aneurysm in maternal grandmother, aunt and distant relatives; leukemia in paternal uncle  SH: does not drink, former smoker   Current CV agents: Amlodipine 10mg  daily, HCTZ 25mg  daily   Past CV agents: Lisinopril (mother had rxn before, asked to switch), Losartan   Patient-reported dietary habits: admits to poor diet and usually only eats one large meal per  day. Drinking more water, cutting back on salt. Does not eat meat, only fish. Eats vegetables that are mostly sourced from restaurants.    Patient-reported exercise habits: attending the [facility] PREP program    Wt Readings from Last 3 Encounters:  11/01/21 197 lb 6.4 oz (89.5 kg)  10/29/21 194 lb 12.8 oz (88.4 kg)  10/22/21 198 lb (89.8 kg)   BP Readings from Last 3 Encounters:  11/01/21 130/88  09/12/21 138/82  08/14/21 (!) 162/92   Pulse Readings from Last 3 Encounters:  11/01/21 82  09/12/21 77  08/14/21 75    Current Outpatient Medications  Medication Sig Dispense Refill   chlorthalidone (HYGROTON) 25 MG tablet Take 1 tablet (25 mg total) by mouth daily. 90 tablet 3   acetaminophen (TYLENOL) 500 MG tablet Take 1,000 mg by mouth every 8 (eight) hours as needed for moderate pain or headache.     amLODipine (NORVASC) 10 MG tablet Take 1 tablet (10 mg total) by mouth daily. 90 tablet 3   Ascorbic Acid (VITAMIN C) 1000 MG tablet Take 1,000 mg by mouth daily.     atorvastatin (LIPITOR) 20 MG tablet Take 1 tablet by mouth daily.     BIOTIN PO Take 100 mg by mouth daily.     Multiple Vitamins-Minerals (MULTIVITAMIN ADULT PO) Take 1 tablet by mouth daily.     No current facility-administered medications for this visit.   Facility-Administered Medications Ordered in Other Visits  Medication Dose Route Frequency  Provider Last Rate Last Admin   heparin lock flush 100 unit/mL  500 Units Intravenous Once PRN [name], MD       sodium chloride flush (NS) 0.9 % injection 10 mL  10 mL Intravenous PRN [name], MD   10 mL at 08/07/17 1354   sodium chloride flush (NS) 0.9 % injection 10 mL  10 mL Intravenous PRN [name], MD        No Known Allergies  Past Medical History:  Diagnosis Date   Anemia    Arthritis    Breast cancer ([place])    Cancer ([place])    left breast cancer   Family history of breast cancer    Genetic testing 05/28/2017   Common Cancers panel (46 genes) @ [facility] - No  pathogenic mutations detected   GERD (gastroesophageal reflux disease)    Hair loss 09/12/2021   Headache    Migraines   Hypertension    Non-healing surgical wound    right chest wall    Blood pressure 130/88, pulse 82, height 5\' 6"  (1.676 m), weight 197 lb 6.4 oz (89.5 kg).  Essential (primary) hypertension Blood pressure is mildly uncontrolled on current regimen. Reports medication adherence and denies any medication-related adverse effects. Discussed modifiable and non-modifiable risk factors for hypertension. BP goal < 130/80.    Stop Hydrochlorothiazide   Start Chlorthalidone 25mg  daily  Continue Amlodipine 10mg  daily   Check BMP in 2 weeks    Recommend DASH diet  Recommend reduced sodium intake (< 1500mg /day)   [place] [employer] 2023  I was with student and patient for entire visit and agree with above assessment and plan.   Tommy Medal PharmD CPP [facility] [address] [place], [place] [address] [phone]

## 2021-11-06 ENCOUNTER — Ambulatory Visit (INDEPENDENT_AMBULATORY_CARE_PROVIDER_SITE_OTHER): Payer: Federal, State, Local not specified - PPO

## 2021-11-06 ENCOUNTER — Other Ambulatory Visit: Payer: Self-pay

## 2021-11-06 DIAGNOSIS — Z0000 Encounter for general adult medical examination without abnormal findings: Secondary | ICD-10-CM

## 2021-11-06 NOTE — Progress Notes (Signed)
Appointment Outcome: Completed, Session #: 3 ?Start time: 4:31pm   End time: 4:56pm   Total Mins: 25 minutes ? ?AGREEMENTS SECTION ? ? ?Overall Goal(s): ?Improve healthy eating habits  ?Increase physical activity                                        ?  ?Agreement/Action Steps:  ?Improve healthy eating habits ?Eat cake/sweets 2-3 times a week ?Drink mini (10 oz.) can sodas instead of larger volume of soda ?Determine which healthy snack options to consume ?Read food labels to monitor consuming 25 g of sugar or less per day ?Drink 64-100 oz of water per day ?  ?Increase physical activity ?Exercise for 30-40 minutes ?Attend PREP on T @ Th ?Walk on Wed around 5:00pm ? ?Progress Notes:  ?Patient shared that she was able to adhere to going to PREP as scheduled. Patient mentioned that she really liked this class. Patient stated that tomorrow is the last class, and she wants to continue to exercise on the same schedule. Patient reported that she is looking into different gyms that her insurance will provide a discount towards membership so she can continue exercising. Patient mentioned that [employer] is a gym that was on the list of gyms she will be checking out.  ? ?Patient stated that she was able to maintain going to the [facility] early before PREP started and walked for an hour on the treadmill. Patient continues to walk on Wednesdays as scheduled. Patient expressed that she thinks she will do better walking in the mornings on Wednesdays around 6:30am. Patient mentioned that she is feeling better and her knees doesn't hurt as much. Patient shared that she can trot up steps now compared to before and would be tired from climbing stairs.  ? ?Patient reported that she ate one slice of cake during the previous week and no cake the past week. Patient has been eating healthier snacks instead. Patient shared that she eats green apples with peanut butter, red grapes, and home-canned cucumbers. Patient stated that she has been  reading food labels and has been doing well with monitoring her sugar intake. Patient has also cut back on her sodium intake because of reading food labels. Patient stated that she can now taste the salt in food.  ? ?Patient stated that she has reduced the number of 10 oz can sodas to 1-2 a day when she does drink soda. Patient is drinking Pepsi or Ginger Ale. Patient mentioned that she does not drink soda every day. Patient reported that during the week she has been able to drink 8 (16.9 oz) bottles of water except on the weekends. Patient shared that during the weekends she may drink between 4-6 bottles of water because of running errands and not being home. However, patient does drink more water on the weekends when she is home. Patient shared that keeping the 8 bottles of water near her desk during work helps her maintain drinking her water.  ? ?Indicators of Success and Accountability:  Patient stated that limiting her sweets and the extra walking on Tuesdays and Thursdays are her indicators of success and accountability.  ?Readiness: Patient is in the action phase of improving healthy eating habits and increasing physical activity.  ?Strengths and Supports: Patient is holding herself accountable to implement her action steps and making them part of her lifestyle. ?Challenges and Barriers: Patient does not  foresee any challenges/barriers to implementing her action steps over the next two weeks.  ? ?Coaching Outcomes: ?Patient will research which gyms her insurance provides a discount towards the membership to decide which gym she will continue her exercise routine at.  ? ?Patient will continue to work on increasing her water intake during the weekend.  ? ?Patient will continue to implement her action steps as outlined above over the next two weeks.  ? ?Attempted: ?Fulfilled - Patient has been able to maintain not eating cake/sweets more than 2-3 times a week. Patient continues to drink 10 oz mini cans of soda,  eat healthy snacks, read food labels, and exercise as outlined.  ?Partial - Patient has been able to drink 64-100 oz of water 5 days a week.  ? ? ?

## 2021-11-07 DIAGNOSIS — Z23 Encounter for immunization: Secondary | ICD-10-CM | POA: Diagnosis not present

## 2021-11-07 DIAGNOSIS — E782 Mixed hyperlipidemia: Secondary | ICD-10-CM | POA: Diagnosis not present

## 2021-11-07 DIAGNOSIS — I10 Essential (primary) hypertension: Secondary | ICD-10-CM | POA: Diagnosis not present

## 2021-11-08 NOTE — Progress Notes (Signed)
[facility] PREP Evaluation ? ?Patient Details  ?Name: Briana Price ?MRN: [id number] ?Date of Birth: 1965/10/09 ?Age: 56 y.o. ?PCP: [name], MD ? ?Vitals:  ? 11/07/21 1800  ?BP: 118/82  ?Pulse: 94  ?SpO2: 98%  ?Weight: 192 lb (87.1 kg)  ? ? ? [facility] Eval - 11/08/21 1300   ? ?  ? [facility]PREP[facility]  ? [facility]PREP[facility] Location [place]   ?  ? Referral   ? Referring Provider Oval Linsey   ? Program Start Date 11/07/21   Final PREP class  ?  ? Measurement  ? Waist Circumference 41 inches   ? Hip Circumference 43 inches   ? Body fat 37.39 percent   [employer] 3[employer]  ?  ? Information for Trainer  ? Goals Has plans to walk in neighborhood most days   ?  ? Mobility and Daily Activities  ? I find it easy to walk up or down two or more flights of stairs. 3   ? I have no trouble taking out the trash. 4   ? I do housework such as vacuuming and dusting on my own without difficulty. 4   ? I can easily lift a gallon of milk (8lbs). 4   ? I can easily walk a mile. 3   ? I have no trouble reaching into high cupboards or reaching down to pick up something from the floor. 4   ? I do not have trouble doing out-door work such as Armed forces logistics/support/administrative officer, raking leaves, or gardening. 4   ?  ? Mobility and Daily Activities  ? I feel younger than my age. 3   ? I feel independent. 4   ? I feel energetic. 2   ? I live an active life.  2   ? I feel strong. 2   ? I feel healthy. 2   ? I feel active as other people my age. 2   ?  ? How fit and strong are you.  ? Fit and Strong Total Score 43   ? ?  ?  ? ?  ? ?Past Medical History:  ?Diagnosis Date  ? Anemia   ? Arthritis   ? Breast cancer ([place])   ? Cancer [facility])   ? left breast cancer  ? Family history of breast cancer   ? Genetic testing 05/28/2017  ? Common Cancers panel (46 genes) @ [facility] - No pathogenic mutations detected  ? GERD (gastroesophageal reflux disease)   ? Hair loss 09/12/2021  ? Headache   ? Migraines  ? Hypertension   ? Non-healing surgical wound   ? right chest wall  ? ?Past Surgical  History:  ?Procedure Laterality Date  ? BREAST SURGERY    ? biopsy  ? CESAREAN SECTION    ? CHOLECYSTECTOMY N/A 02/07/2013  ? Procedure: LAPAROSCOPIC CHOLECYSTECTOMY WITH INTRAOPERATIVE CHOLANGIOGRAM;  Surgeon: Gwenyth Ober, MD;  Location: [place];  Service: General;  Laterality: N/A;  ? COLONOSCOPY W/ BIOPSIES AND POLYPECTOMY    ? COLONOSCOPY W/ BIOPSIES AND POLYPECTOMY    ? EXCISION OF BREAST LESION Right 06/08/2019  ? Procedure: EXCISION OF NON HEALING WOUND RIGHT CHEST;  Surgeon: [name], MD;  Location: [place];  Service: General;  Laterality: Right;  ? FOOT SURGERY  2003  ? lt foot bunionectomy  ? MASTECTOMY W/ SENTINEL NODE BIOPSY Bilateral 11/19/2017  ? Procedure: BILATERAL MASTECTOMIES WITH LEFT SENTINEL LYMPH NODE BIOPSY;  Surgeon: [name], MD;  Location: [facility];  Service: General;  Laterality: Bilateral;  ? MULTIPLE TOOTH EXTRACTIONS    ? PORTACATH PLACEMENT Right 05/19/2017  ? Procedure: INSERTION PORT-A-CATH;  Surgeon: [name], MD;  Location: [facility];  Service: General;  Laterality: Right;  ? ?Social History  ? ?Tobacco Use  ?Smoking Status Former  ? Types: Cigarettes  ?Smokeless Tobacco Never  ?Tobacco Comments  ? 15-20 years ago  ? ?Cardio march test: 249 to 246 ?Sit to stand: 8 to 13 ?Bicep curl: 14 to 17 ?Balance remains good ? ?Barnett Hatter ?11/08/2021, 1:33 PM ? ? ?

## 2021-11-12 ENCOUNTER — Encounter: Payer: Self-pay | Admitting: Hematology

## 2021-11-12 ENCOUNTER — Inpatient Hospital Stay: Payer: Federal, State, Local not specified - PPO | Attending: Hematology | Admitting: Hematology

## 2021-11-12 ENCOUNTER — Inpatient Hospital Stay: Payer: Federal, State, Local not specified - PPO

## 2021-11-12 ENCOUNTER — Other Ambulatory Visit: Payer: Self-pay

## 2021-11-12 VITALS — BP 127/82 | HR 86 | Temp 98.9°F | Resp 18 | Ht 66.0 in | Wt 198.0 lb

## 2021-11-12 DIAGNOSIS — I10 Essential (primary) hypertension: Secondary | ICD-10-CM | POA: Diagnosis not present

## 2021-11-12 DIAGNOSIS — M545 Low back pain, unspecified: Secondary | ICD-10-CM | POA: Insufficient documentation

## 2021-11-12 DIAGNOSIS — Z171 Estrogen receptor negative status [ER-]: Secondary | ICD-10-CM

## 2021-11-12 DIAGNOSIS — D649 Anemia, unspecified: Secondary | ICD-10-CM | POA: Diagnosis not present

## 2021-11-12 DIAGNOSIS — C50512 Malignant neoplasm of lower-outer quadrant of left female breast: Secondary | ICD-10-CM | POA: Diagnosis not present

## 2021-11-12 LAB — CBC WITH DIFFERENTIAL/PLATELET
Abs Immature Granulocytes: 0.02 10*3/uL (ref 0.00–0.07)
Basophils Absolute: 0.1 10*3/uL (ref 0.0–0.1)
Basophils Relative: 1 %
Eosinophils Absolute: 0.1 10*3/uL (ref 0.0–0.5)
Eosinophils Relative: 1 %
HCT: 39.3 % (ref 36.0–46.0)
Hemoglobin: 13.7 g/dL (ref 12.0–15.0)
Immature Granulocytes: 0 %
Lymphocytes Relative: 25 %
Lymphs Abs: 2 10*3/uL (ref 0.7–4.0)
MCH: 29.6 pg (ref 26.0–34.0)
MCHC: 34.9 g/dL (ref 30.0–36.0)
MCV: 84.9 fL (ref 80.0–100.0)
Monocytes Absolute: 0.5 10*3/uL (ref 0.1–1.0)
Monocytes Relative: 6 %
Neutro Abs: 5.4 10*3/uL (ref 1.7–7.7)
Neutrophils Relative %: 67 %
Platelets: 351 10*3/uL (ref 150–400)
RBC: 4.63 MIL/uL (ref 3.87–5.11)
RDW: 12 % (ref 11.5–15.5)
WBC: 8.1 10*3/uL (ref 4.0–10.5)
nRBC: 0 % (ref 0.0–0.2)

## 2021-11-12 LAB — COMPREHENSIVE METABOLIC PANEL
ALT: 23 U/L (ref 0–44)
AST: 21 U/L (ref 15–41)
Albumin: 4.6 g/dL (ref 3.5–5.0)
Alkaline Phosphatase: 83 U/L (ref 38–126)
Anion gap: 8 (ref 5–15)
BUN: 11 mg/dL (ref 6–20)
CO2: 34 mmol/L — ABNORMAL HIGH (ref 22–32)
Calcium: 10.6 mg/dL — ABNORMAL HIGH (ref 8.9–10.3)
Chloride: 99 mmol/L (ref 98–111)
Creatinine, Ser: 0.6 mg/dL (ref 0.44–1.00)
GFR, Estimated: >60 mL/min (ref >60)
Glucose, Bld: 114 mg/dL — ABNORMAL HIGH (ref 70–99)
Potassium: 3.7 mmol/L (ref 3.5–5.1)
Sodium: 141 mmol/L (ref 135–145)
Total Bilirubin: 0.8 mg/dL (ref 0.3–1.2)
Total Protein: 8.3 g/dL — ABNORMAL HIGH (ref 6.5–8.1)

## 2021-11-12 NOTE — Progress Notes (Signed)
[facility]   Telephone:([phone]) [phone] Fax:(336) [phone]   Clinic Follow up Note   Patient Care Team: [name], MD as PCP - General (Family Medicine) [name], MD as Consulting Physician (Hematology) [name], MD as Consulting Physician (General Surgery) [name], MD as Consulting Physician (Radiation Oncology) Delice Bison Charlestine Massed, NP as Nurse Practitioner (Hematology and Oncology) Avelino Leeds (Cardiology)  Date of Service:  11/12/2021  CHIEF COMPLAINT: f/u of left breast cancer (triple negative)  CURRENT THERAPY:  Surveillance  ASSESSMENT & PLAN:  Briana Price is a 56 y.o. female with   1. Malignant neoplasm of lower-outer quadrant of left breast of female, invasive ductal carcinoma, c2T0M0, Stage IIB, triple negative, Grade 3. ypT0N0 -She was diagnosed in 04/2017. She is s/p neoadjuvant AC-T and b/l mastectomy (no reconstruction yet due to Covid pandemic) -She is clinically doing well. Lab not performed before OV today. Physical exam was unremarkable.  No clinical concern for recurrence. -She is 4.5 years since her cancer diagnosis. Her risk of recurrence has significantly decreased. We will see her back once more. She is s/p B/l mastectomy so she does not need mammograms.  -We will follow-up 1 more time in 8 to 9 months.   2. Mild Anemia -new, noted hgb 11.6 on 04/19/21 -Repeated CBC normal today   3. Genetic Testing was negative for pathogenetic mutations with VUS of ATM.    4. HTN -Continue follow-up with her primary care physician  -She is on HCTZ and losartan. Will continue    5. 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    6. Cancer screenings  -She had a colonoscopy with Dr. Collene Mares since completing chemo. -She is up to date on her Pap Smears, will continue    7. Low back pain -She has had lumbar back pain before but has worsened recently with relief from sitting and leaning forward. This is  Intermittent -Overall stable, I encouraged her to exercise       PLAN:  -We will obtain labs today -Lab and follow-up in Nov or Dec 2023, for last visit.   No problem-specific Assessment & Plan notes found for this encounter.   SUMMARY OF ONCOLOGIC HISTORY: Oncology History Overview Note  Cancer Staging Malignant neoplasm of lower-outer quadrant of left breast of female, estrogen receptor negative ([place]) Staging form: Breast, AJCC 8th Edition - Clinical: Stage IIB (cT2, cN0(f), cM0, G3, ER: Negative, PR: Negative, HER2: Negative) - Unsigned     Malignant neoplasm of lower-outer quadrant of left breast of female, estrogen receptor negative ([place])  04/27/2017 Mammogram   IMPRESSION: 1. Highly suspicious mass within the LEFT breast at the 5:30 o'clock axis, 4 cm from the nipple, measuring 2.5 cm, corresponding to the mammographic finding and corresponding to 1 of the palpable lumps in the left breast. Ultrasound-guided biopsy is recommended. 2. Mildly prominent lymph node in the LEFT axilla, with normal cortical thickness but with questionable effacement of the fatty hilum. Ultrasound-guided core biopsy is recommended. 3. No evidence of malignancy within the RIGHT breast. Also, no evidence of malignancy within the second area of clinical concern in the upper outer left breast.   04/29/2017 Initial Diagnosis   Malignant neoplasm of lower-outer quadrant of left breast of female, estrogen receptor negative ([place])   04/29/2017 Receptors her2   Estrogen Receptor: 0%, NEGATIVE Progesterone Receptor: 0%, NEGATIVE Proliferation Marker Ki67: 90% HER2 NEGATIVE    04/29/2017 Initial Biopsy    Diagnosis 1. Breast, left,  needle core biopsy, 5:30 o'clock, mass - INVASIVE DUCTAL CARCINOMA, G2-3 - DUCTAL CARCINOMA IN SITU. - SEE COMMENT. 2. Lymph node, needle/core biopsy, left axilla - THERE IN NO EVIDENCE OF CARCINOMA IN 1 OF 1 LYMPH NODE (0/1).   05/14/2017 Imaging   MRI of Breast  Bilateral 05/14/17  IMPRESSION: 1. 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 2. Non mass enhancement extending approximately 3.2 cm anterior to the mass in the lower inner quadrant of the left breast, suspicious for DCIS. There is no correlate on recent mammography. The overall extent of the mass and non mass enhancement is approximately 5 cm. 3. No MRI evidence of malignancy involving the right breast. 4. No pathologic lymphadenopathy. Upper normal sized left axillary lymph nodes, the largest of which was previously biopsied no evidence of metastatic disease.   05/15/2017 Imaging   Bone scan whole body 05/15/17 IMPRESSION: No findings specific for metastatic disease to bone.   05/15/2017 Imaging   CT CAP W contrast 05/15/17  IMPRESSION: 1. 18 mm soft tissue lesion inferior left breast compatible with known primary malignancy. 2. Mildly enlarged left axillary lymph nodes in this patient with biopsy-proven metastatic disease to the left axilla. 3. No evidence for lymphadenopathy elsewhere in the chest. No lymphadenopathy in the abdomen or pelvis. No evidence for metastatic disease in the abdomen or pelvis. 4. Tiny right perifissural lung nodule most likely subpleural lymph node. Attention on follow-up recommended. 5. 7 mm hypoattenuating lesion in the spleen, likely a cyst or pseudocyst. Attention on follow-up recommended. 6. Uterine fibroids. 7.  Aortic Atherosclerois (ICD10-170.0)   05/19/2017 Procedure   Port placement by Dr. [name] on 9/11/8   05/22/2017 - 10/09/2017 Chemotherapy    neoadjuvant chemotherpy AC every 2 weeks for 4 cycles starting 05/22/17 and completed on 07/07/17, followed by weekly paclitaxel for 12 cycles starting 07/24/17 to complete on 10/09/17    05/22/2017 Pathology Results   Diagnosis Breast, left, needle core biopsy, lower inner quad - DUCTAL CARCINOMA IN  SITU. 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  The genes analyzed were the 46 genes on [facility]'s Common Cancers panel (APC, ATM, AXIN2, BARD1, BMPR1A, BRCA1, BRCA2, BRIP1, CDH1, CDKN2A, CHEK2, CTNNA1, DICER1, EPCAM, GREM1, HOXB13, KIT, MEN1, MLH1, MSH2, MSH3, MSH6, MUTYH, NBN, NF1, NTHL1, PALB2, PDGFRA, PMS2, POLD1, POLE, PTEN, RAD50, RAD51C, RAD51D, SDHA, SDHB, SDHC, SDHD, SMAD4, SMARCA4, STK11, TP53, TSC1, TSC2, VHL).     08/14/2017 Echocardiogram   EF 60-65%  Study Conclusions   - Left ventricle: The cavity size was normal. Systolic function was   normal. The estimated ejection fraction was in the range of 60%   to 65%. Wall motion was normal; there were no regional wall   motion abnormalities. Doppler parameters are consistent with   abnormal left ventricular relaxation (grade 1 diastolic   dysfunction). - Pulmonary arteries: PA peak pressure: 31 mm Hg (S).   Impressions:   - No change from September 2018 study (strain rate not performed on   that study).       10/14/2017 Imaging   MRI Breast Bilateral  IMPRESSION:  Significant improvement following neoadjuvant treatment.   No residual enhancement in the left breast at sites of known malignancy.   RECOMMENDATION: Treatment plan for known malignancy.   11/19/2017 Surgery   BILATERAL MASTECTOMIES WITH  LEFT SENTINEL LYMPH NODE BIOPSY by Dr. [name] 11/19/17   11/19/2017 Pathology Results    Diagnosis 11/19/17 1. Breast, simple mastectomy, Right - FIBROCYSTIC AND FIBROADENOMATOID CHANGE. - NO MALIGNANCY IDENTIFIED. 2. Breast, simple mastectomy, Left - NO RESIDUAL CARCINOMA IDENTIFIED. - BIOPSY SITES. - FIBROCYSTIC AND FIBROADENOMATOID CHANGE. - SEE ONCOLOGY TABLE. 3. Lymph node, sentinel, biopsy, Left Axillary #1 - ONE OF ONE LYMPH NODES NEGATIVE FOR CARCINOMA (0/1). - BIOPSY SITE. 4. Lymph node,  sentinel, biopsy, Left Axillary # 2 - ONE OF ONE LYMPH NODES NEGATIVE FOR CARCINOMA (0/1).   12/29/2018 Imaging   CT CAP W Contrast  IMPRESSION: No evidence of recurrent or metastatic carcinoma. No other acute findings.   3.5 cm right uterine fibroid.   Colonic diverticulosis, without radiographic evidence of diverticulitis.        INTERVAL HISTORY:  Briana Price is here for a follow up of breast cancer. She was last seen by me on 04/19/21. She presents to the clinic alone.  One of her coworkers recently was diagnosed with metastatic cancer, she was concerned about cancer recurrence, and came to see [place] earlier than previously scheduled.  She is clinically stable, low back pain is mild overall, usually when she stands or walk for long period of time.  No other new pain.  She has good appetite and weight is stable.  She reports occasional difficulty swallowing, especially with multiple pills, no dysphagia with solid food, her bowel movement has been normal.  She has not had breast reconstruction   All other systems were reviewed with the patient and are negative.  MEDICAL HISTORY:  Past Medical History:  Diagnosis Date   Anemia    Arthritis    Breast cancer ([place])    Cancer ([place])    left breast cancer   Family history of breast cancer    Genetic testing 05/28/2017   Common Cancers panel (46 genes) @ [facility] - No pathogenic mutations detected   GERD (gastroesophageal reflux disease)    Hair loss 09/12/2021   Headache    Migraines   Hypertension    Non-healing surgical wound    right chest wall    SURGICAL HISTORY: Past Surgical History:  Procedure Laterality Date   BREAST SURGERY     biopsy   CESAREAN SECTION     CHOLECYSTECTOMY N/A 02/07/2013   Procedure: LAPAROSCOPIC CHOLECYSTECTOMY WITH INTRAOPERATIVE CHOLANGIOGRAM;  Surgeon: Gwenyth Ober, MD;  Location: [facility];  Service: General;  Laterality: N/A;   COLONOSCOPY W/ BIOPSIES AND POLYPECTOMY      COLONOSCOPY W/ BIOPSIES AND POLYPECTOMY     EXCISION OF BREAST LESION Right 06/08/2019   Procedure: EXCISION OF NON HEALING WOUND RIGHT CHEST;  Surgeon: [name], MD;  Location: [facility];  Service: General;  Laterality: Right;   FOOT SURGERY  2003   lt foot bunionectomy   MASTECTOMY W/ SENTINEL NODE BIOPSY Bilateral 11/19/2017   Procedure: BILATERAL MASTECTOMIES WITH LEFT SENTINEL LYMPH NODE BIOPSY;  Surgeon: [name], MD;  Location: [facility];  Service: General;  Laterality: Bilateral;   MULTIPLE TOOTH EXTRACTIONS     PORTACATH PLACEMENT Right 05/19/2017   Procedure: INSERTION PORT-A-CATH;  Surgeon: [name], MD;  Location: [facility];  Service: General;  Laterality: Right;    I have reviewed the social history and family history with the patient and they are unchanged from previous note.  ALLERGIES:  has No Known Allergies.  MEDICATIONS:  Current Outpatient Medications  Medication Sig Dispense Refill  acetaminophen (TYLENOL) 500 MG tablet Take 1,000 mg by mouth every 8 (eight) hours as needed for moderate pain or headache.     amLODipine (NORVASC) 10 MG tablet Take 1 tablet (10 mg total) by mouth daily. 90 tablet 3   Ascorbic Acid (VITAMIN C) 1000 MG tablet Take 1,000 mg by mouth daily.     atorvastatin (LIPITOR) 20 MG tablet Take 1 tablet by mouth daily.     BIOTIN PO Take 100 mg by mouth daily.     chlorthalidone (HYGROTON) 25 MG tablet Take 1 tablet (25 mg total) by mouth daily. 90 tablet 3   Multiple Vitamins-Minerals (MULTIVITAMIN ADULT PO) Take 1 tablet by mouth daily.     No current facility-administered medications for this visit.   Facility-Administered Medications Ordered in Other Visits  Medication Dose Route Frequency Provider Last Rate Last Admin   heparin lock flush 100 unit/mL  500 Units Intravenous Once PRN [name], MD       sodium chloride flush (NS) 0.9 % injection 10 mL  10 mL Intravenous PRN [name], MD   10 mL at 08/07/17 1354   sodium  chloride flush (NS) 0.9 % injection 10 mL  10 mL Intravenous PRN [name], MD        PHYSICAL EXAMINATION: ECOG PERFORMANCE STATUS: 0 - Asymptomatic  Vitals:   11/12/21 1201  BP: 127/82  Pulse: 86  Resp: 18  Temp: 98.9 F (37.2 C)  SpO2: 98%   Wt Readings from Last 3 Encounters:  11/12/21 198 lb (89.8 kg)  11/07/21 192 lb (87.1 kg)  11/01/21 197 lb 6.4 oz (89.5 kg)     GENERAL:alert, no distress and comfortable SKIN: skin color, texture, turgor are normal, no rashes or significant lesions EYES: normal, Conjunctiva are pink and non-injected, sclera clear NECK: supple, thyroid normal size, non-tender, without nodularity LYMPH:  no palpable lymphadenopathy in the cervical, axillary  LUNGS: clear to auscultation and percussion with normal breathing effort HEART: regular rate & rhythm and no murmurs and no lower extremity edema ABDOMEN:abdomen soft, non-tender and normal bowel sounds Musculoskeletal:no cyanosis of digits and no clubbing  NEURO: alert & oriented x 3 with fluent speech, no focal motor/sensory deficits BREAST: Status post bilateral mastectomy.  No palpable mass, nodules or adenopathy bilaterally. Breast exam benign.   LABORATORY DATA:  I have reviewed the data as listed CBC Latest Ref Rng & Units 11/12/2021 04/19/2021 10/22/2020  WBC 4.0 - 10.5 K/uL 8.1 10.5 7.7  Hemoglobin 12.0 - 15.0 g/dL 13.7 11.6(L) 13.1  Hematocrit 36.0 - 46.0 % 39.3 34.0(L) 38.4  Platelets 150 - 400 K/uL 351 336 314     CMP Latest Ref Rng & Units 11/12/2021 04/19/2021 10/22/2020  Glucose 70 - 99 mg/dL 114(H) 121(H) 144(H)  BUN 6 - 20 mg/dL _0 Creatinine 0.44 - 1.00 mg/dL 0.60 0.74 0.69  Sodium 135 - 145 mmol/L 141 141 139  Potassium 3.5 - 5.1 mmol/L 3.7 4.4 2.9(L)  Chloride 98 - 111 mmol/L 99 101 104  CO2 22 - 32 mmol/L 34(H) 30 26  Calcium 8.9 - 10.3 mg/dL 10.6(H) 9.5 9.3  Total Protein 6.5 - 8.1 g/dL 8.3(H) 7.4 7.6  Total Bilirubin 0.3 - 1.2 mg/dL 0.8 0.9 0.8  Alkaline Phos 38 -  126 U/L 83 90 82  AST 15 - 41 U/L _1 ALT 0 - 44 U/L _2 RADIOGRAPHIC STUDIES: I have personally reviewed the radiological images  as listed and agreed with the findings in the report. No results found.    No orders of the defined types were placed in this encounter.  All questions were answered. The patient knows to call the clinic with any problems, questions or concerns. No barriers to learning was detected. The total time spent in the appointment was 25 minutes.     [name], MD 11/12/2021   I, Wilburn Mylar, am acting as scribe for [name], MD.   I have reviewed the above documentation for accuracy and completeness, and I agree with the above.

## 2021-11-20 ENCOUNTER — Other Ambulatory Visit: Payer: Self-pay

## 2021-11-20 ENCOUNTER — Ambulatory Visit (INDEPENDENT_AMBULATORY_CARE_PROVIDER_SITE_OTHER): Payer: Federal, State, Local not specified - PPO

## 2021-11-20 DIAGNOSIS — Z0000 Encounter for general adult medical examination without abnormal findings: Secondary | ICD-10-CM

## 2021-11-20 NOTE — Progress Notes (Signed)
Appointment Outcome: Completed, Session #: 4 ?Start time: 4:31pm   End time: 4:55pm   Total Mins: 24 minutes ? ?AGREEMENTS SECTION ? ? ? ?Overall Goal(s): ?Improve healthy eating habits  ?Increase physical activity                                        ?  ?Agreement/Action Steps:  ?Improve healthy eating habits ?Eat cake/sweets 2-3 times a week ?Drink mini (10 oz.) can sodas instead of larger volume of soda ?Determine which healthy snack options to consume ?Read food labels to monitor consuming 25 g of sugar or less per day ?Drink 64-100 oz of water per day ?  ?Increase physical activity ?Exercise for 30-40 minutes ?Attend PREP on T @ Th ?Walk on Wed around 5:00pm ? ?Progress Notes:  ?Patient reported that she has eaten one cupcake in the past two weeks. Patient stated that she has been replacing sweets with B[place] apples and peanut butter. Patient stated that she has been working on eating more protein so that she can reduce the cravings for sweets. Patient continues to choose healthy snacks in addition to apples such as cucumbers, grapes, and pineapples. Patient is reading food labels so that she can monitor her sugar intake.  ? ?Patient stated that it is challenging sometimes to maintain not going over '25mg'$  of sugar per day. Patient stated that she tries to make sure not to go over as much as possible by calculating how many sugars she had during the day and will try not to eat something if it will make her go over her goal.  ? ?Patient continues to drink 1-2 (10 oz.) mini cans of soda during the past two weeks. Patient mentioned that she puts a lot of ice in her cup so she can water down her soda. Patient shared that she did drink 2-12 oz cans of Ginger Ale to help settle her stomach over the past two weeks. Patient has been able to maintain drinking at least 64 oz of water per day by consuming 5-16.9 oz bottles of water. Patient  ? ?Patient stated that PREP ended on March 2. Patient shared that since the  program has ended, she has been walking twice weekly for 30-45 minutes. Patient stated that her knees and back have been causing her some discomfort, but she pushes herself to walk as much as she can. Patient stated that she does not have a membership at the [facility] to continue her PREP workout routine so she will be visiting [employer] tomorrow to inquire about their gym membership. Patient stated that she is still interested in walking at least every other day (3-4xs/week) for 30-45 minutes.  ? ? ?Indicators of Success and Accountability:  Patient stated that reducing her consumption of cakes/sweets is her indicator of success and accountability.  ?Readiness: Patient is in the action phase of increasing physical activity and improving healthy eating habits. ?Strengths and Supports: Patient is mindful of her eating behaviors and implementing what she learned from PREP about protein and sugar consumption. ?Challenges and Barriers: Patient does not foresee any challenges to implementing her actions steps over the next two weeks.  ? ? ? ?Coaching Outcomes: ?Patient will investigate the cost of a gym membership at [place] on 3/16. ? ?Patient will incorporate 3-4 days of walking for 30-45 minutes per day.  ? ?Patient will implement the following action steps  over the next two weeks.  ? ? ?Agreement/Action Steps:  ?Improve healthy eating habits ?Eat cake/sweets 2-3 times a week ?Drink mini (10 -12 oz.) can sodas instead of larger volume of soda ?Determine which healthy snack options to consume ?Read food labels to monitor consuming 25 g of sugar or less per day ?Drink 64-100 oz of water per day ?  ?Increase physical activity ?Exercise for at least 30-45 minutes 3-4 days per week ?Investigate the cost of a gym membership at [facility] ?Walk 3-4 days per week for 30-45 minutes ? ? ? ?Attempted: ?Fulfilled - Patient has been able to maintain not eating cake/sweets more than 2-3 times/week. Patient continues to incorporate  healthy snacks into her diet. Patient is reading food labels to monitor sugar intake to maintain not eating more than 25 mg of sugar daily. Patient has been able to maintain drinking at least 64 oz of water per day. Patient was able to complete PREP. Patient is walking twice a week for 30-45 minutes. ?Partial - Patient has had a mixture of 10-12 oz cans of soda in the past two weeks.  ? ? ? ?

## 2021-11-21 DIAGNOSIS — I10 Essential (primary) hypertension: Secondary | ICD-10-CM | POA: Diagnosis not present

## 2021-11-22 LAB — BASIC METABOLIC PANEL
BUN/Creatinine Ratio: 20 (ref 9–23)
BUN: 13 mg/dL (ref 6–24)
CO2: 30 mmol/L — ABNORMAL HIGH (ref 20–29)
Calcium: 10.5 mg/dL — ABNORMAL HIGH (ref 8.7–10.2)
Chloride: 95 mmol/L — ABNORMAL LOW (ref 96–106)
Creatinine, Ser: 0.66 mg/dL (ref 0.57–1.00)
Glucose: 106 mg/dL — ABNORMAL HIGH (ref 70–99)
Potassium: 3.1 mmol/L — ABNORMAL LOW (ref 3.5–5.2)
Sodium: 138 mmol/L (ref 134–144)
eGFR: 104 mL/min/{1.73_m2} (ref >59)

## 2021-12-02 ENCOUNTER — Telehealth (HOSPITAL_BASED_OUTPATIENT_CLINIC_OR_DEPARTMENT_OTHER): Payer: Self-pay | Admitting: *Deleted

## 2021-12-02 DIAGNOSIS — E876 Hypokalemia: Secondary | ICD-10-CM

## 2021-12-02 DIAGNOSIS — Z5181 Encounter for therapeutic drug level monitoring: Secondary | ICD-10-CM

## 2021-12-02 MED ORDER — POTASSIUM CHLORIDE CRYS ER 20 MEQ PO TBCR: EXTENDED_RELEASE_TABLET | ORAL | 0 refills | Status: DC

## 2021-12-02 MED ORDER — CHLORTHALIDONE 25 MG PO TABS: ORAL_TABLET | ORAL | 3 refills | Status: DC

## 2021-12-02 NOTE — Telephone Encounter (Signed)
Potassium 11/21/2021 3.1 ?Discussed with Alena Bills D who reviewed with Dr Audie Box, comments below ? ? have her take KCl 7mq daily for 3 days and cut chlorthalidone to 12.5 mg (1/2 tab).  Repeat BMET Friday   ? ?Advised patient, verbalized understanding  ?

## 2021-12-03 ENCOUNTER — Telehealth: Payer: Self-pay | Admitting: Cardiovascular Disease

## 2021-12-03 NOTE — Telephone Encounter (Signed)
Pt c/o medication issue: ? ?1. Name of Medication: potassium chloride SA (KLOR-CON M) 20 MEQ tablet ? ?2. How are you currently taking this medication (dosage and times per day)? Needs to verify dose ? ?3. Are you having a reaction (difficulty breathing--STAT)? no ? ?4. What is your medication issue? Patient states she thought she was told to take the potassium 1 tablet daily for 3 days, but the pharmacy gave her 10 tablets. She would like to make sure she is not supposed to take more. ?

## 2021-12-03 NOTE — Telephone Encounter (Signed)
Spoke with patient and explained extra sent just in case she needs at later date ?Stated she would have labs done Friday as advised  ?

## 2021-12-04 ENCOUNTER — Ambulatory Visit (INDEPENDENT_AMBULATORY_CARE_PROVIDER_SITE_OTHER): Payer: Federal, State, Local not specified - PPO

## 2021-12-04 DIAGNOSIS — Z0000 Encounter for general adult medical examination without abnormal findings: Secondary | ICD-10-CM

## 2021-12-04 NOTE — Progress Notes (Signed)
Appointment Outcome: Completed, Session #: 5 ?Start time: 4:30pm   End time: 4:55pm   Total Mins: 25 minutes ? ?AGREEMENTS SECTION ? ? ? ?Overall Goal(s): ?Improve healthy eating habits  ?Increase physical activity ? ?Agreement/Action Steps:  ?Improve healthy eating habits ?Eat cake/sweets 2-3 times a week ?Drink mini (10 -12 oz.) can sodas instead of larger volume of soda ?Determine which healthy snack options to consume ?Read food labels to monitor consuming 25 g of sugar or less per day ?Drink 64-100 oz of water per day ?  ?Increase physical activity ?Exercise for at least 30-45 minutes 3-4 days per week ?Investigate the cost of a gym membership at [place] ?Walk 3-4 days per week for 30-45 minutes ? ?Progress Notes:  ?Patient stated that she only had one cupcake within the past two weeks that was given to her by her sister. Patient mentioned that she has been eating more apples and peanut butter instead of eating sweets. Patient has increased her fruit consumption as her healthy snack options such as kiwi, oranges, grapes, and grapefruit.  ? ?Patient reported that she only had 2 mini ginger ale sodas during week one and one mini ginger ale soda during week two. Patient stated that she has been trying to move away from drinking Pepsi sodas. Patient shared that she has been able to continue to drink 5-6 (16.9 oz) bottles of water 5 days of week. Patient stated that she put out 8 bottles of water during the week on her work desk to drink but have not reached that goal yet.  ? ?Patient mentioned that on the weekends she is only drinking about 2 bottles of water daily. Patient stated that sometimes she may drink a liter bottle of water from [place] and finds herself drinking more water on Sundays when she's at her sister's house to avoid drinking soda. Patient stated that she does carry water bottles with her to drink, but with moving around more and being distracted she drinks less.  ? ?Patient has been reading  food labels to monitor sugar intake but have also started monitoring her sodium intake. Patient has become more aware of the sodium content in foods and its association to her increase in thirst. Patient is ensuring that she does not consume excess amounts of sodium daily. ? ?Patient has been able to walk at least 3 days each week over the past two weeks in her neighborhood. Patient stated that her walks last about an average of 45 minutes each time. Patient stated that she did not have a set time to walk, and it was based on her schedule. Patient was able to investigate the cost of the gym membership at [employer] through [employer] and [employer]. Patient mentioned that she needs more details about the cost and will find time to call the insurance company for further information.  ? ? ? ?Indicators of Success and Accountability:  Patient stated that maintaining her healthy eating habits is her indicator of success and accountability.  ?Readiness: Patient is in the action phase of improve healthy eating habits and increasing physical activity.  ?Strengths and Supports: Patient is being diligent in implementing her action steps and things that she learned during PREP to accomplish her goals.  ?Challenges and Barriers: Patient does not foresee any challenges to implementing her action steps over the next two weeks.  ? ? ? ?Coaching Outcomes: ?Discussed with patient strategies to increase water consumption on the weekends that included drinking out of a  tumbler that has markings on it to help her keep track of how much water she has consumed by a certain time in the day.  ? ?Discussed with patient consuming between 1,500-2,'000mg'$  of sodium daily and how reading food labels will be helpful in monitoring her consumption with each meal and snacks. ? ? ?Patient will continue to implement her action steps as outlined above over the next two weeks.  ? ?Attempted: ?Fulfilled - Patient is maintaining eating cake/sweets less  than 2-3 times a week, drinking mini 10 oz can sodas, eating healthy snacks, reading food labels, exercising at least 3 days a week for approximately 45 minutes by walking, and has investigated the cost of the gym membership. ?Partial - Patient is reaching 64 oz of water 5 days a week. ? ?

## 2021-12-06 DIAGNOSIS — Z5181 Encounter for therapeutic drug level monitoring: Secondary | ICD-10-CM | POA: Diagnosis not present

## 2021-12-06 DIAGNOSIS — E876 Hypokalemia: Secondary | ICD-10-CM | POA: Diagnosis not present

## 2021-12-07 LAB — BASIC METABOLIC PANEL
BUN/Creatinine Ratio: 14 (ref 9–23)
BUN: 9 mg/dL (ref 6–24)
CO2: 30 mmol/L — ABNORMAL HIGH (ref 20–29)
Calcium: 10.6 mg/dL — ABNORMAL HIGH (ref 8.7–10.2)
Chloride: 98 mmol/L (ref 96–106)
Creatinine, Ser: 0.66 mg/dL (ref 0.57–1.00)
Glucose: 115 mg/dL — ABNORMAL HIGH (ref 70–99)
Potassium: 4.5 mmol/L (ref 3.5–5.2)
Sodium: 144 mmol/L (ref 134–144)
eGFR: 104 mL/min/{1.73_m2} (ref >59)

## 2021-12-18 ENCOUNTER — Ambulatory Visit (INDEPENDENT_AMBULATORY_CARE_PROVIDER_SITE_OTHER): Payer: Federal, State, Local not specified - PPO

## 2021-12-18 DIAGNOSIS — Z0000 Encounter for general adult medical examination without abnormal findings: Secondary | ICD-10-CM

## 2021-12-18 NOTE — Progress Notes (Signed)
Appointment Outcome: Completed, Session #: 6 ?Start time: 4:30pm   End time: 5:00pm   Total Mins: 30 minutes ? ? ? ?AGREEMENTS SECTION ? ? ?Overall Goal(s): ?Improve healthy eating habits  ?Increase physical activity ?  ?Agreement/Action Steps:  ?Improve healthy eating habits ?Eat cake/sweets 2-3 times a week ?Drink mini (10 -12 oz.) can sodas instead of larger volume of soda ?Determine which healthy snack options to consume ?Read food labels to monitor consuming 25 g of sugar or less per day ?Drink 64-100 oz of water per day ?  ?Increase physical activity ?Exercise for at least 30-45 minutes 3-4 days per week ?Investigate the cost of a gym membership at [place] ?Walk 3-4 days per week for 30-45 minutes ? ?Progress Notes:  ?Patient stated that in the past two weeks she has eaten cake once on Easter. Patient stated that on the same day she wanted peach cobbler but was able to talk herself out of getting any to eat. Patient reminded herself of things that she has noticed since she has reduced her sugar consumption (e.g., certain foods are sweeter to her now, or she's not going to eat it all because it is too sweet).  Patient has replaced her sweets with healthy snacks that consist of apples and peanut butter, kiwi, strawberries, grapes, and cantaloupe. Patient is reading food labels to manage her sugar consumption of 25 g or less.  ? ?Patient shared a time when she was eating cashews, which reminded her that she should start reading food labels to monitor her sodium intake. Patient mentioned that she is managing her sodium intake in other ways by not cooking with salt frequently and not eating at fast food restaurants as much as she used too. Patient has reduced her fast-food consumption to 2-3 times a week. Patient stated that now that monitoring sodium intake is on her radar, she will be even more conscious of her eating behavior.  ? ?Patient shared that she may drink an average of 3-4 mini 12 oz cans of soda  during the week. Patient stated that she is reducing her soda consumption because it has started to taste sweeter to her and she doesn't drink as much of the soda. Patient has worked on increasing her water consumption. Patient has been able to maintain drinking 5 or 6-16.9 oz water bottles per day. Patient stated that on some days she can drink more.  ? ?Patient reported that she has been using flavor drink packets and drink a total of 20-16.9 oz bottles of water with the packets in addition the plain water. Patient shared that she is still working on increasing her water intake on the weekends. Patient stated that she typically drinks about 3-16.9 oz bottles of water daily on Saturdays and Sundays. ? ?Patient has been able to maintain walking 3 days/week over the past two weeks. Patient reported that she walks for 45 minutes or more each time. Patient stated that she did not have challenges with the weather even when it rain, she went to another location where the weather wasn't as bad to ensure that she walked on those specific days. Patient shared that she has not had time to research more about the gym membership but will do so next week when she is out for vacation.  ? ?Patient shared that she will be working overtime at her job, which will change the dynamics of her work schedule and location. Patient stated that this may present challenges to maintaining her exercise routine  when she works 12-hour shifts during the week along with weekends.  ? ?Indicators of Success and Accountability:  Patient stated that being able to maintain an exercise routine is her indicator of success and accountability. ?Readiness: Patient is in the action phase of improving healthy eating habits and increasing physical activity. ?Strengths and Supports: Patient is being supported by family. Patient is determined, motivated, and consistent. ?Challenges and Barriers: Patient will soon be working overtime at her job, which may be a  challenge to maintaining her exercise routine.  ? ?Coaching Outcomes: ?Patient will continue to implement her action steps as outlined below over the next month.  ? ?Discussed with patient steps that she could take at work that would account for her being physically activity until she is able to set up a routine around her work schedule.  ? ?Patient is going to call [employer] and [employer] regarding the [employer].  ? ?Agreement/Action Steps:  ?Improve healthy eating habits ?Eat cake/sweets 2-3 times a week ?Drink mini (10 -12 oz.) can sodas instead of larger volume of soda ?Maintain eating healthy snacks  ?Read food labels ?Monitor consuming 25 g of sugar or less per day ?Monitor consuming 1,500-2,040m of sodium per day ?Drink 64-100 oz of water per day ?  ?Increase physical activity ?Exercise for at least 30-45 minutes 3-4 days per week ?[place] [facility] [place] [facility]?Walk 3-4 days per week for 30-45 minutes ? ? ?Attempted: ?Fulfilled - Patient was able to eat cake/sweets less than 2-3xs/weekly, drink mini cans of soda, eat healthy snacks, read food labels to monitor sugar consumption, and exercise 3 days/week for 45 minutes or more.  ?Partial - Patient has been able to maintain drinking a minimum of 64 oz of water during weekends and approximately 50 oz on the weekends. ?Not met - Patient was not able to investigate the cost of a gym membership at [facility] ?

## 2021-12-19 ENCOUNTER — Other Ambulatory Visit: Payer: Self-pay

## 2021-12-19 DIAGNOSIS — Z79899 Other long term (current) drug therapy: Secondary | ICD-10-CM

## 2021-12-19 MED ORDER — SPIRONOLACTONE 25 MG PO TABS: 25.0000 mg | ORAL_TABLET | Freq: Every day | ORAL | 3 refills | Status: DC

## 2021-12-31 ENCOUNTER — Encounter (HOSPITAL_BASED_OUTPATIENT_CLINIC_OR_DEPARTMENT_OTHER): Payer: Self-pay | Admitting: Cardiovascular Disease

## 2021-12-31 ENCOUNTER — Ambulatory Visit (INDEPENDENT_AMBULATORY_CARE_PROVIDER_SITE_OTHER): Payer: Federal, State, Local not specified - PPO | Admitting: Cardiovascular Disease

## 2021-12-31 ENCOUNTER — Other Ambulatory Visit: Payer: Self-pay | Admitting: Cardiovascular Disease

## 2021-12-31 DIAGNOSIS — F419 Anxiety disorder, unspecified: Secondary | ICD-10-CM

## 2021-12-31 DIAGNOSIS — I10 Essential (primary) hypertension: Secondary | ICD-10-CM | POA: Diagnosis not present

## 2021-12-31 DIAGNOSIS — Z006 Encounter for examination for normal comparison and control in clinical research program: Secondary | ICD-10-CM

## 2021-12-31 DIAGNOSIS — Z79899 Other long term (current) drug therapy: Secondary | ICD-10-CM | POA: Diagnosis not present

## 2021-12-31 NOTE — Patient Instructions (Signed)
Medication Instructions:  ?Your physician recommends that you continue on your current medications as directed. Please refer to the Current Medication list given to you today.  ? ?Labwork: ?NONE ? ?Testing/Procedures: ?NONE ? ?Follow-Up: ?05/26/2022 8:15 AM WITH DR Marianna  ? ?Any Other Special Instructions Will Be Listed Below (If Applicable). ?PLEASE BRING YOUR HOME MONITORING BLOOD PRESSURE MACHINE BACK TO THE OFFICE AT YOUR EARLIEST CONVENIENCE  ? ?If you need a refill on your cardiac medications before your next appointment, please call your pharmacy. ?

## 2021-12-31 NOTE — Research (Signed)
I saw pt today after Dr. Blenda Mounts follow up visit. Pt is in Dr. Blenda Mounts Virtual Care HTN Study. Pt filled out research survey. Pt was enrolled in Group 2. Pt reached her target blood pressure. Pt successfully completed Virtual Care HTN Study. ?

## 2021-12-31 NOTE — Assessment & Plan Note (Addendum)
Blood pressure has been much better controlled.  She is doing well with exercise.  She has been struggling with stress from work.  She has worked with our [place] and had success with increasing exercise.  They will talk some about stress management.  Continue amlodipine and spironolactone.  She has completed her monitoring in the [place] RPM system.  We will follow up in 6 months and if her BP remains controlled, she will graduate from the Advanced Hypertension Clinic. ?

## 2021-12-31 NOTE — Progress Notes (Signed)
? ?Advanced Hypertension Clinic Follow-up:   ? ?Date:  12/31/2021  ? ?ID:  Briana Price, DOB 1966/07/22, MRN [id number] ? ?PCP:  [name], MD  ?Cardiologist:  None  ?Nephrologist: ? ?Referring MD: [name], MD  ? ?CC: Hypertension ? ?History of Present Illness:   ? ?Briana Price is a 56 y.o. female with a hx of anemia, arthritis, breast cancer, GERD, and hypertension, here for follow-up. She initially established care in the Advanced Hypertension Clinic 05/07/2021. She saw Dr. Garwin Brothers on 01/2021 and her blood pressure was 138/98 on HCTZ and Losartan, so she was referred to advanced hypertension clinic. She has a history of breast cancer and underwent bilateral mastectomy. She saw oncology 04/2021 and her blood pressure was well controlled. Her blood pressure averaged [id number] systolic at home. She also reported a few episodes of a "pulling," positional chest pain randomly at rest. She had some dyspnea when climbing inclines or stairs. She was referred for the PREP program and advised to limit sodium to 1500-2000 mg daily.  ? ?At her last appointment she was fatigued and struggling to stay motivated. She had started the PREP program and was trying to walk for exercise, but was limited by left knee pain. She continued to make progress with dietary changes, including cutting back on sweets and sodas. However, her blood pressure remained poorly controlled at home and in clinic. Losartan was switched to amlodipine 10 mg daily. She was enrolled in the [place] remote patient monitoring study. She followed up with our pharmacist 11/01/2021 and her BP was 130/88. Her at home readings averaged 127/86 per [facility]. A few days prior to that appointment she had an episode of dull aching chest pain lasting 10 minutes. HCTZ was discontinued, and she was started on chlorthalidone 25 mg daily. She has not been checking her blood pressure regularly, but when she does her readings average 110-130s/70-90s. She continues to  work with our Engineer, maintenance. She followed up with oncology 11/2021 and was doing well. ? ?Today, she is feeling okay aside from stress and anxiety due to life stressors. Sometimes she feels overwhelmed, but tries to calm herself with activities at home. 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 For exercise she is doing well, walking at least 3 times a week for 45 minutes to 1 hour. She is not able to walk more frequently due to chronic pain in her leg and back. She denies any palpitations, chest pain, shortness of breath, or peripheral edema. No lightheadedness, headaches, syncope, orthopnea, or PND. ? ?Previous Antihypertensives: ?Lisinopril ? ?Past Medical History:  ?Diagnosis Date  ? Anemia   ? Arthritis   ? Breast cancer ([place])   ? Cancer [facility])   ? left breast cancer  ? Family history of breast cancer   ? Genetic testing 05/28/2017  ? Common Cancers panel (46 genes) @ [facility] - No pathogenic mutations detected  ? GERD (gastroesophageal reflux disease)   ? Hair loss 09/12/2021  ? Headache   ? Migraines  ? Hypertension   ? Non-healing surgical wound   ? right chest wall  ? ? ?Past Surgical History:  ?Procedure Laterality Date  ? BREAST SURGERY    ? biopsy  ? CESAREAN SECTION    ? CHOLECYSTECTOMY N/A 02/07/2013  ? Procedure: LAPAROSCOPIC CHOLECYSTECTOMY WITH INTRAOPERATIVE CHOLANGIOGRAM;  Surgeon: Gwenyth Ober, MD;  Location: [place];  Service: General;  Laterality: N/A;  ? COLONOSCOPY W/ BIOPSIES  AND POLYPECTOMY    ? COLONOSCOPY W/ BIOPSIES AND POLYPECTOMY    ? EXCISION OF BREAST LESION Right 06/08/2019  ? Procedure: EXCISION OF NON HEALING WOUND RIGHT CHEST;  Surgeon: [name], MD;  Location: [place];  Service: General;  Laterality: Right;  ? FOOT SURGERY  2003  ? lt foot bunionectomy  ? MASTECTOMY W/ SENTINEL NODE BIOPSY Bilateral 11/19/2017  ? Procedure: BILATERAL MASTECTOMIES WITH LEFT SENTINEL LYMPH NODE BIOPSY;  Surgeon: [name], MD;  Location:  [place];  Service: General;  Laterality: Bilateral;  ? MULTIPLE TOOTH EXTRACTIONS    ? PORTACATH PLACEMENT Right 05/19/2017  ? Procedure: INSERTION PORT-A-CATH;  Surgeon: [name], MD;  Location: [place];  Service: General;  Laterality: Right;  ? ? ?Current Medications: ?Current Meds  ?Medication Sig  ? acetaminophen (TYLENOL) 500 MG tablet Take 1,000 mg by mouth every 8 (eight) hours as needed for moderate pain or headache.  ? amLODipine (NORVASC) 10 MG tablet Take 1 tablet (10 mg total) by mouth daily.  ? Ascorbic Acid (VITAMIN C) 1000 MG tablet Take 1,000 mg by mouth daily.  ? atorvastatin (LIPITOR) 20 MG tablet Take 1 tablet by mouth daily.  ? BIOTIN PO Take 100 mg by mouth daily.  ? Multiple Vitamins-Minerals (MULTIVITAMIN ADULT PO) Take 1 tablet by mouth daily.  ? potassium chloride SA (KLOR-CON M) 20 MEQ tablet Take 1 tablet by mouth for 3 days only or as directed by physician  ? spironolactone (ALDACTONE) 25 MG tablet Take 1 tablet (25 mg total) by mouth daily.  ?  ? ?Allergies:   Patient has no known allergies.  ? ?Social History  ? ?Socioeconomic History  ? Marital status: Married  ?  Spouse name: Not on file  ? Number of children: Not on file  ? Years of education: Not on file  ? Highest education level: Not on file  ?Occupational History  ? Not on file  ?Tobacco Use  ? Smoking status: Former  ?  Types: Cigarettes  ? Smokeless tobacco: Never  ? Tobacco comments:  ?  15-20 years ago  ?Vaping Use  ? Vaping Use: Never used  ?Substance and Sexual Activity  ? Alcohol use: No  ? Drug use: No  ? Sexual activity: Not on file  ?Other Topics Concern  ? Not on file  ?Social History Narrative  ? Tobacco use  ? Cigarettes: Never smoked   ? Tobacco history last updated 01/23/2014  ? No smoking  ? No tobacco exposure  ? No alcohol  ? Caffeine : yes soda  ? No recreational drug use  ? Occupation :employed ,[employer] 01/25/2013  ? Martial status : single   ? Children: [employer]ie [employer]a 01/25/2013  ? 82 year  old son  ? ?Social Determinants of Health  ? ?Financial Resource Strain: Not on file  ?Food Insecurity: Not on file  ?Transportation Needs: Not on file  ?Physical Activity: Not on file  ?Stress: Not on file  ?Social Connections: Not on file  ?  ? ?Family History: ?The patient's family history includes Breast cancer in her maternal grandmother and other family members; Breast cancer (age of onset: 72) in her paternal aunt; Breast cancer (age of onset: 43) in her maternal aunt; Heart attack in her maternal grandfather; Hypertension in her mother; Leukemia in her paternal uncle. ? ?ROS:   ?Please see the history of present illness.    ?(+) Stress/Anxiety ?(+) Chronic back pain, leg pain ?All other systems reviewed  and are negative. ? ?EKGs/Labs/Other Studies Reviewed:   ? ?TTE 02/15/2018: ?- Left ventricle: The cavity size was normal. Wall thickness was  ?  increased in a pattern of mild LVH. Systolic function was normal.  ?  The estimated ejection fraction was in the range of 60% to 65%.  ?  Wall motion was normal; there were no regional wall motion  ?  abnormalities. Doppler parameters are consistent with abnormal  ?  left ventricular relaxation (grade 1 diastolic dysfunction).  ?- Impressions: GLS -19.9%. LS&' 9.8 cm/s.  ? ?CT Chest 12/28/2017: ?COMPARISON:  05/15/2017 ?  ?FINDINGS: ?CT CHEST FINDINGS ?  ?Cardiovascular: No acute findings. ?  ?Mediastinum/Lymph Nodes: No masses or pathologically enlarged lymph ?nodes identified. Patient has undergone bilateral mastectomies and ?left axillary lymph node dissection since previous study. No ?evidence of axillary lymphadenopathy. ?  ?Lungs/Pleura: No pulmonary infiltrate or mass identified. Stable ?tiny 1-2 mm perifissural right lung nodule, consistent with ?intrapulmonary lymph node. No new or enlarging pulmonary nodules ?identified. No effusion present. ?  ?Musculoskeletal:  No suspicious bone lesions identified. ?  ?CT ABDOMEN AND PELVIS FINDINGS ?  ?Hepatobiliary: No  masses identified. Prior cholecystectomy. No ?evidence of biliary obstruction. ?  ?Pancreas:  No mass or inflammatory changes. ?  ?Spleen: No evidence of splenomegaly. Subcapsular cyst measuring 8 mm ?i

## 2021-12-31 NOTE — Assessment & Plan Note (Signed)
She is struggling with anxiety from work stress.  She will discuss this with our Care Guide. ?

## 2022-01-01 LAB — BASIC METABOLIC PANEL
BUN/Creatinine Ratio: 14 (ref 9–23)
BUN: 9 mg/dL (ref 6–24)
CO2: 27 mmol/L (ref 20–29)
Calcium: 10.4 mg/dL — ABNORMAL HIGH (ref 8.7–10.2)
Chloride: 99 mmol/L (ref 96–106)
Creatinine, Ser: 0.65 mg/dL (ref 0.57–1.00)
Glucose: 115 mg/dL — ABNORMAL HIGH (ref 70–99)
Potassium: 5.1 mmol/L (ref 3.5–5.2)
Sodium: 140 mmol/L (ref 134–144)
eGFR: 105 mL/min/{1.73_m2} (ref >59)

## 2022-01-15 ENCOUNTER — Ambulatory Visit (INDEPENDENT_AMBULATORY_CARE_PROVIDER_SITE_OTHER): Payer: Federal, State, Local not specified - PPO

## 2022-01-15 DIAGNOSIS — Z0000 Encounter for general adult medical examination without abnormal findings: Secondary | ICD-10-CM

## 2022-01-15 NOTE — Progress Notes (Signed)
Appointment Outcome:  ?Completed, Session #: 79-month f/u ?Start time: 4:31pm   End time: 5:02pm  Total Mins: 31 minutes ? ?AGREEMENTS SECTION ? ? ? ?Overall Goal(s): ?Improve healthy eating habits  ?Increase physical activity ?  ?Agreement/Action Steps:  ?Improve healthy eating habits ?Eat cake/sweets 2-3 times a week ?Drink mini (10 -12 oz.) can sodas instead of larger volume of soda ?Maintain eating healthy snacks  ?Read food labels ?Monitor consuming 25 g of sugar or less per day ?Monitor consuming 1,500-2,000mg  of sodium per day ?Drink 64-100 oz of water per day ?  ?Increase physical activity ?Exercise for at least 30-45 minutes 3-4 days per week ?Investigate the cost of a gym membership at [place] ?Walk 3-4 days per week for 30-45 minutes ? ?Progress Notes:  ?Patient stated that she walks a minimum of 3 days per week for 45 minutes to an hour. Patient shared that some weeks she was able to walk a fourth day, which depended on the weather. Patient mentioned that she walks around her neighborhood 3-4 times. Patient stated that she didn't push herself because she is still having issues with her knees. Patient was encouraged to get compression sleeve for her legs.  ? ?Patient reported that she has not investigated the gym membership at [employer] through her insurance. Patient stated that she still plan to look into this to start a reasonable gym membership. In the meantime, the patient stated that she will continue to walk 3-4 times per week.  ? ?Patient shared that she hasn't had anything sweet in a week. Patient shared that she had one cupcake once per week or week and a half. Patient stated that it is getting easier to avoid eating sweets the less she consumes sugar. Patient reported that she has been eating cashews and pistachios. Patient shared that she has done research on these nuts and learned that they are family to poison ivy and is not sure if she wants to continue eating them. Patient mentioned that  she is more mindful of what she is eating. Patient shared that she is also eating strawberries, kiwi, grapes, and cantaloupe, but need to shop for more.  ? ?Patient stated that she has decreased her soda consumption and currently do not have soda in her home. Patient shared that she has not had any soda since this weekend where she had 1-12 oz Pepsi this past Friday - Sunday because she was at her family's house. Patient stated that she has been able to maintain drinking 4-16.9 oz bottles of water per day to consume a minimum of 64 oz of water per day. Patient is aiming to drink 8-16.9 oz bottles of water per day  (~135 oz) but has not reached that goal. However patient is drinking an average of 6-16.9 oz bottles of water per day (~101 oz). ? ?Patient reported that she has been reading food labels when shopping to monitor her sodium and sugar intake to make sure she is not consuming too much. Patient stated that she has been able to stay within the range of 1,500mg  - 2,000mg  of sodium per day. Patient mentioned that she had a time when she did eat a bag of chips that contained 4.5 servings with 190mg  of sodium per serving (855 mg of sodium in total). Patient stated that one time she did eat the whole bag, but has not since. Patient shared that the products that she normally eats does not have a lot of sugar in it. Patient stated  that since she is refraining from eating cupcakes like before, she has cut back on her sugar intake tremendously. Patient stated that she stopped eating doughnuts and cookies.  ? ?Asked patient if she was instructed to resume checking her blood pressure after her visit on 4/25 because there are no recent readings in [place] for her. Patient isn't sure if she was suppose to continue checking her blood pressure after the visit or return device.  ? ?Indicators of Success and Accountability:  Patient stated that this steps has evolved into a lifestyle and she knows what she has to do to be  healthy.  ?Readiness: Patient is in the action phase of improving healthy eating habits and increasing physical activity.   ?Strengths and Supports: Patient is being supported by her family. Patient has been determined, consistent, and disciplined.  ?Challenges and Barriers: Patient does not foresee any challenges/barriers to implementing her action steps over the next three months.  ? ?Coaching Outcomes: ?Patient will continue to implement action steps over the next three months as outlined above .  ? ?Researched issue through recent AVS from 4/25 and saw that she was provided instructions to bring her Home Monitoring device back to the office at her earliest convenience. Sent message to Dr. Oval Linsey and Rip Harbour to confirm instructions of returning device. Will call patient after receiving confirmation.  ? ?Attempted: ?Fulfilled - Patient has been able to reduce eating cake/sweets to less than twice a week. Patient continues to drink 12 oz sodas sparingly. Patient has been able to maintain eating healthy snacks, reading food labels to monitor sodium and sugar consumption, and maintaining drinking a minimum of 64 oz of water per day. Patient has walked 3-4 days per week for 45 minutes or longer.  ?Not met - Patient has not investigated the process to get a gym membership at [place] through her insurance. ?

## 2022-01-17 ENCOUNTER — Telehealth (HOSPITAL_BASED_OUTPATIENT_CLINIC_OR_DEPARTMENT_OTHER): Payer: Self-pay

## 2022-01-17 DIAGNOSIS — Z0000 Encounter for general adult medical examination without abnormal findings: Secondary | ICD-10-CM

## 2022-01-17 NOTE — Telephone Encounter (Signed)
Called patient to inform her that it was confirmed that she has completed the [place] program and that she can return the device at her earliest convenience at either [facility] or [facility]. Patient did not answer. Left message for patient regarding the return of the device and to call if she has any questions. ? ?Avelino Leeds, Anton Ruiz ?[facility] ?Care Guide, Health Coach ?[address] ?[place] [address] ?Telephone: [phone] ?Email: [email or web address] ? ?

## 2022-01-20 DIAGNOSIS — Z9013 Acquired absence of bilateral breasts and nipples: Secondary | ICD-10-CM | POA: Diagnosis not present

## 2022-01-20 DIAGNOSIS — Z01419 Encounter for gynecological examination (general) (routine) without abnormal findings: Secondary | ICD-10-CM | POA: Diagnosis not present

## 2022-01-20 DIAGNOSIS — Z853 Personal history of malignant neoplasm of breast: Secondary | ICD-10-CM | POA: Diagnosis not present

## 2022-01-20 DIAGNOSIS — I10 Essential (primary) hypertension: Secondary | ICD-10-CM | POA: Diagnosis not present

## 2022-02-06 NOTE — Telephone Encounter (Signed)
Patient seen 4/25 in office

## 2022-02-28 ENCOUNTER — Emergency Department (HOSPITAL_COMMUNITY): Payer: Federal, State, Local not specified - PPO

## 2022-02-28 ENCOUNTER — Emergency Department (HOSPITAL_COMMUNITY)
Admission: EM | Admit: 2022-02-28 | Discharge: 2022-02-28 | Disposition: A | Discharge status: Home / Self Care | Payer: Federal, State, Local not specified - PPO | Attending: Emergency Medicine | Admitting: Emergency Medicine

## 2022-02-28 ENCOUNTER — Encounter (HOSPITAL_COMMUNITY): Payer: Self-pay

## 2022-02-28 ENCOUNTER — Other Ambulatory Visit: Payer: Self-pay

## 2022-02-28 DIAGNOSIS — Z853 Personal history of malignant neoplasm of breast: Secondary | ICD-10-CM | POA: Diagnosis not present

## 2022-02-28 DIAGNOSIS — M546 Pain in thoracic spine: Secondary | ICD-10-CM | POA: Diagnosis not present

## 2022-02-28 DIAGNOSIS — R6884 Jaw pain: Secondary | ICD-10-CM | POA: Insufficient documentation

## 2022-02-28 DIAGNOSIS — M25512 Pain in left shoulder: Secondary | ICD-10-CM | POA: Diagnosis not present

## 2022-02-28 DIAGNOSIS — W19XXXA Unspecified fall, initial encounter: Secondary | ICD-10-CM | POA: Diagnosis not present

## 2022-02-28 DIAGNOSIS — Z79899 Other long term (current) drug therapy: Secondary | ICD-10-CM | POA: Insufficient documentation

## 2022-02-28 DIAGNOSIS — I10 Essential (primary) hypertension: Secondary | ICD-10-CM | POA: Diagnosis not present

## 2022-02-28 DIAGNOSIS — R404 Transient alteration of awareness: Secondary | ICD-10-CM | POA: Diagnosis not present

## 2022-02-28 DIAGNOSIS — R55 Syncope and collapse: Secondary | ICD-10-CM

## 2022-02-28 DIAGNOSIS — M25511 Pain in right shoulder: Secondary | ICD-10-CM | POA: Diagnosis not present

## 2022-02-28 DIAGNOSIS — M549 Dorsalgia, unspecified: Secondary | ICD-10-CM | POA: Diagnosis not present

## 2022-02-28 DIAGNOSIS — M5134 Other intervertebral disc degeneration, thoracic region: Secondary | ICD-10-CM | POA: Diagnosis not present

## 2022-02-28 LAB — COMPREHENSIVE METABOLIC PANEL
ALT: 41 U/L (ref 0–44)
AST: 48 U/L — ABNORMAL HIGH (ref 15–41)
Albumin: 4.1 g/dL (ref 3.5–5.0)
Alkaline Phosphatase: 84 U/L (ref 38–126)
Anion gap: 10 (ref 5–15)
BUN: 10 mg/dL (ref 6–20)
CO2: 29 mmol/L (ref 22–32)
Calcium: 9.7 mg/dL (ref 8.9–10.3)
Chloride: 102 mmol/L (ref 98–111)
Creatinine, Ser: 0.85 mg/dL (ref 0.44–1.00)
GFR, Estimated: >60 mL/min (ref >60)
Glucose, Bld: 140 mg/dL — ABNORMAL HIGH (ref 70–99)
Potassium: 4.3 mmol/L (ref 3.5–5.1)
Sodium: 141 mmol/L (ref 135–145)
Total Bilirubin: 1.3 mg/dL — ABNORMAL HIGH (ref 0.3–1.2)
Total Protein: 7.5 g/dL (ref 6.5–8.1)

## 2022-02-28 LAB — CBC WITH DIFFERENTIAL/PLATELET
Abs Immature Granulocytes: 0.03 10*3/uL (ref 0.00–0.07)
Basophils Absolute: 0.1 10*3/uL (ref 0.0–0.1)
Basophils Relative: 1 %
Eosinophils Absolute: 0.1 10*3/uL (ref 0.0–0.5)
Eosinophils Relative: 1 %
HCT: 41.4 % (ref 36.0–46.0)
Hemoglobin: 13.6 g/dL (ref 12.0–15.0)
Immature Granulocytes: 0 %
Lymphocytes Relative: 12 %
Lymphs Abs: 1.1 10*3/uL (ref 0.7–4.0)
MCH: 28.8 pg (ref 26.0–34.0)
MCHC: 32.9 g/dL (ref 30.0–36.0)
MCV: 87.7 fL (ref 80.0–100.0)
Monocytes Absolute: 0.6 10*3/uL (ref 0.1–1.0)
Monocytes Relative: 6 %
Neutro Abs: 7.4 10*3/uL (ref 1.7–7.7)
Neutrophils Relative %: 80 %
Platelets: 368 10*3/uL (ref 150–400)
RBC: 4.72 MIL/uL (ref 3.87–5.11)
RDW: 12 % (ref 11.5–15.5)
WBC: 9.1 10*3/uL (ref 4.0–10.5)
nRBC: 0 % (ref 0.0–0.2)

## 2022-02-28 LAB — TROPONIN I (HIGH SENSITIVITY)
Troponin I (High Sensitivity): 5 ng/L (ref <18)
Troponin I (High Sensitivity): 4 ng/L (ref <18)

## 2022-02-28 LAB — CBG MONITORING, ED: Glucose-Capillary: 130 mg/dL — ABNORMAL HIGH (ref 70–99)

## 2022-02-28 MED ORDER — IOHEXOL 300 MG/ML  SOLN
100.0000 mL | Freq: Once | INTRAMUSCULAR | Status: AC | PRN
  Administered 2022-02-28: 100 mL via INTRAVENOUS

## 2022-02-28 MED ORDER — LACTATED RINGERS IV BOLUS
1000.0000 mL | Freq: Once | INTRAVENOUS | Status: AC
  Administered 2022-02-28: 1000 mL via INTRAVENOUS

## 2022-02-28 NOTE — ED Notes (Signed)
Discharge instructions reviewed with patient. Patient verbalized understanding of instructions. Follow-up care and medications were reviewed. Patient ambulatory with steady gait. VSS upon discharge.  ?

## 2022-02-28 NOTE — ED Notes (Signed)
Pt transported to CT ?

## 2022-02-28 NOTE — ED Triage Notes (Addendum)
Pt bib ems from home, stated feeling dizzy while cleaning, denies mixing cleaning products, not using anything new; started getting dizzy, had syncopal episode; husband broke fall, did not hit head; alert to verbal, cool, pale,diaphoretic with ems; alert after 8-10 minutes; warm and dry at present; initial bp 86/70, hx breast cancer, in remission; 126/84, HR 86, 100% RA, RR 24; restricted L side d/t mastectomy; states she hasn't eaten today, no hx DM; pt endorses mid back pain, states she landed on her left side

## 2022-03-04 NOTE — Progress Notes (Signed)
Office Visit    Patient Name: Briana Price Date of Encounter: 03/05/2022  PCP:  [name], [facility]  Cardiologist:  Skeet Latch, MD  Advanced Practice Provider:  No care team member to display Electrophysiologist:  None      Chief Complaint    Briana Price is a 56 y.o. female  presents today for syncope   Past Medical History    Past Medical History:  Diagnosis Date   Anemia    Arthritis    Breast cancer ([place])    Cancer ([place])    left breast cancer   Family history of breast cancer    Genetic testing 05/28/2017   Common Cancers panel (46 genes) @ [facility] - No pathogenic mutations detected   GERD (gastroesophageal reflux disease)    Hair loss 09/12/2021   Headache    Migraines   Hypertension    Non-healing surgical wound    right chest wall   Past Surgical History:  Procedure Laterality Date   BREAST SURGERY     biopsy   CESAREAN SECTION     CHOLECYSTECTOMY N/A 02/07/2013   Procedure: LAPAROSCOPIC CHOLECYSTECTOMY WITH INTRAOPERATIVE CHOLANGIOGRAM;  Surgeon: Gwenyth Ober, MD;  Location: [place];  Service: General;  Laterality: N/A;   COLONOSCOPY W/ BIOPSIES AND POLYPECTOMY     COLONOSCOPY W/ BIOPSIES AND POLYPECTOMY     EXCISION OF BREAST LESION Right 06/08/2019   Procedure: EXCISION OF NON HEALING WOUND RIGHT CHEST;  Surgeon: [name], MD;  Location: [place];  Service: General;  Laterality: Right;   FOOT SURGERY  2003   lt foot bunionectomy   MASTECTOMY W/ SENTINEL NODE BIOPSY Bilateral 11/19/2017   Procedure: BILATERAL MASTECTOMIES WITH LEFT SENTINEL LYMPH NODE BIOPSY;  Surgeon: [name], MD;  Location: [place];  Service: General;  Laterality: Bilateral;   MULTIPLE TOOTH EXTRACTIONS     PORTACATH PLACEMENT Right 05/19/2017   Procedure: INSERTION PORT-A-CATH;  Surgeon: [name], MD;  Location: [place];  Service: General;  Laterality: Right;    Allergies  No Known  Allergies  History of Present Illness    Briana Price is a 56 y.o. female with a hx of  hypertension, breast cancer s/p bilateral mastectomy, anxiety, syncope last seen 12/31/21 by Dr. Oval Linsey.  Establish with advanced hypertension clinic August 2022 after referral from her OB/GYN with BP 138/98.  She started the prep program at the [facility].  Exercise tolerance limited by left knee pain.  Losartan transition to amlodipine 10 mg daily.  Enrolled in fifth of 5 remote patient monitoring.  Hydrochlorothiazide discontinued and transition to chlorthalidone 25 mg daily.  Last seen 12/31/2021 doing overall well from a cardiac perspective but did note stress and anxiety due to life stressors.  No changes were made at that time.  She had completed remote patient monitoring she was asked to return her blood pressure cuff.  ED visit 02/28/22 with syncope. CT chest no PE. Reported hypotension by EMS but none in ED. Of note episode occurred when she had not eaten, got up quickly and walked down the stairs.   Presents today for follow up independently.  No recurrent syncope.  She got lightheaded this morning which usually gets better with sitting and resting. Notes occasional sensation of spinning sensation.  Notes continued stressors due to mandatory overtime at work.  No dyspnea, chest pain.  BP at home 110s/70s-80s.   EKGs/Labs/Other Studies Reviewed:  The following studies were reviewed today:   TTE 02/15/2018: - Left ventricle: The cavity size was normal. Wall thickness was    increased in a pattern of mild LVH. Systolic function was normal.    The estimated ejection fraction was in the range of 60% to 65%.    Wall motion was normal; there were no regional wall motion    abnormalities. Doppler parameters are consistent with abnormal    left ventricular relaxation (grade 1 diastolic dysfunction).  - Impressions: GLS -19.9%. LS&' 9.8 cm/s.    CT Chest 12/28/2017: COMPARISON:  05/15/2017    FINDINGS: CT CHEST FINDINGS   Cardiovascular: No acute findings.   Mediastinum/Lymph Nodes: No masses or pathologically enlarged lymph nodes identified. Patient has undergone bilateral mastectomies and left axillary lymph node dissection since previous study. No evidence of axillary lymphadenopathy.   Lungs/Pleura: No pulmonary infiltrate or mass identified. Stable tiny 1-2 mm perifissural right lung nodule, consistent with intrapulmonary lymph node. No new or enlarging pulmonary nodules identified. No effusion present.   Musculoskeletal:  No suspicious bone lesions identified.   CT ABDOMEN AND PELVIS FINDINGS   Hepatobiliary: No masses identified. Prior cholecystectomy. No evidence of biliary obstruction.   Pancreas:  No mass or inflammatory changes.   Spleen: No evidence of splenomegaly. Subcapsular cyst measuring 8 mm is stable. No other splenic lesions identified.   Adrenals/Urinary tract:  No masses or hydronephrosis.   Stomach/Bowel: No evidence of obstruction, inflammatory process, or abnormal fluid collections. Normal appendix visualized. Colonic diverticulosis again noted, without evidence of diverticulitis.   Vascular/Lymphatic: No pathologically enlarged lymph nodes identified. No abdominal aortic aneurysm. Congenital duplication of IVC again noted.   Reproductive: 3.5 cm right uterine fibroid. Adnexal regions are unremarkable.   Other:  None.   Musculoskeletal:  No suspicious bone lesions identified.   IMPRESSION: No evidence of recurrent or metastatic carcinoma. No other acute findings.   3.5 cm right uterine fibroid.   Colonic diverticulosis, without radiographic evidence of diverticulitis.  EKG:  No EKG today.   Recent Labs: 09/12/2021: TSH 0.928 02/28/2022: ALT 41; BUN 10; Creatinine, Ser 0.85; Hemoglobin 13.6; Platelets 368; Potassium 4.3; Sodium 141  Recent Lipid Panel No results found for: "CHOL", "TRIG", "HDL", "CHOLHDL", "VLDL",  "LDLCALC", "LDLDIRECT"  Home Medications   Current Meds  Medication Sig   acetaminophen (TYLENOL) 500 MG tablet Take 1,000 mg by mouth every 8 (eight) hours as needed for moderate pain or headache.   Ascorbic Acid (VITAMIN C) 1000 MG tablet Take 1,000 mg by mouth daily.   atorvastatin (LIPITOR) 20 MG tablet Take 1 tablet by mouth daily.   Multiple Vitamins-Minerals (HAIR/SKIN/NAILS) CAPS Take 3 capsules by mouth daily.   Prenatal Vit-Fe Fumarate-FA (PRENATAL VITAMIN PO) Take by mouth daily.   spironolactone (ALDACTONE) 25 MG tablet Take 1 tablet (25 mg total) by mouth daily.     Review of Systems      All other systems reviewed and are otherwise negative except as noted above.  Physical Exam    VS:  BP 102/66   Pulse 82   Ht '5\' 6"'$  (1.676 m)   Wt 198 lb (89.8 kg)   BMI 31.96 kg/m  , BMI Body mass index is 31.96 kg/m.  Wt Readings from Last 3 Encounters:  03/05/22 198 lb (89.8 kg)  02/28/22 196 lb (88.9 kg)  12/31/21 200 lb 8 oz (90.9 kg)     GEN: Well nourished, overweight, well developed, in no acute distress. HEENT: normal. Neck: Supple, no JVD,  carotid bruits, or masses. Cardiac: RRR, no murmurs, rubs, or gallops. No clubbing, cyanosis, edema.  Radials/PT 2+ and equal bilaterally.  Respiratory:  Respirations regular and unlabored, clear to auscultation bilaterally. GI: Soft, nontender, nondistended. MS: No deformity or atrophy. Skin: Warm and dry, no rash. Neuro:  Strength and sensation are intact. Psych: Normal affect.  Assessment & Plan    Syncope - Recent ED visit syncope. BMP, CBC unremarkable.  09/2021 TSH 0.928 - will update thyroid panel today. 14 day live ZIO placed in clinic today. Echo to rule out valvular abnormality.  If work-up unremarkable and still with lightheadedness could consider carotid duplex.  Symptoms somewhat consistent with orthostatic hypotension and encouraged her to make position changes slowly, stay well-hydrated, eat regular meals.  HTN  - Now with hypotension.  Discontinue spironolactone.  Continue amlodipine 10 mg daily.  Obesity - Weight loss via diet and exercise encouraged. Discussed the impact being overweight would have on cardiovascular risk.   Disposition: Follow up  in September as scheduled  with Skeet Latch, MD   Signed, Loel Dubonnet, NP 03/05/2022, 10:15 AM [place]

## 2022-03-05 ENCOUNTER — Ambulatory Visit (INDEPENDENT_AMBULATORY_CARE_PROVIDER_SITE_OTHER): Payer: Federal, State, Local not specified - PPO

## 2022-03-05 ENCOUNTER — Encounter (HOSPITAL_BASED_OUTPATIENT_CLINIC_OR_DEPARTMENT_OTHER): Payer: Self-pay | Admitting: Family

## 2022-03-05 ENCOUNTER — Encounter (HOSPITAL_BASED_OUTPATIENT_CLINIC_OR_DEPARTMENT_OTHER): Payer: Self-pay

## 2022-03-05 ENCOUNTER — Ambulatory Visit (HOSPITAL_BASED_OUTPATIENT_CLINIC_OR_DEPARTMENT_OTHER): Payer: Federal, State, Local not specified - PPO | Admitting: Family

## 2022-03-05 VITALS — BP 102/66 | HR 82 | Ht 66.0 in | Wt 198.0 lb

## 2022-03-05 DIAGNOSIS — I10 Essential (primary) hypertension: Secondary | ICD-10-CM | POA: Diagnosis not present

## 2022-03-05 DIAGNOSIS — R55 Syncope and collapse: Secondary | ICD-10-CM

## 2022-03-05 DIAGNOSIS — E6609 Other obesity due to excess calories: Secondary | ICD-10-CM

## 2022-03-05 DIAGNOSIS — Z6831 Body mass index (BMI) 31.0-31.9, adult: Secondary | ICD-10-CM | POA: Diagnosis not present

## 2022-03-05 NOTE — Patient Instructions (Signed)
Medication Instructions:  Your physician has recommended you make the following change in your medication:   STOP: Spironolactone- please hold onto it just in case we need again in the future   *If you need a refill on your cardiac medications before your next appointment, please call your pharmacy*   Lab Work: Your physician recommends that you return for lab work today- Thyroid Panel   If you have labs (blood work) drawn today and your tests are completely normal, you will receive your results only by: MyChart Message (if you have [place]) OR A paper copy in the mail If you have any lab test that is abnormal or we need to change your treatment, we will call you to review the results.   Testing/Procedures: Your physician has requested that you have an echocardiogram after 7/13. Echocardiography is a painless test that uses sound waves to create images of your heart. It provides your doctor with information about the size and shape of your heart and how well your heart's chambers and valves are working. This procedure takes approximately one hour. There are no restrictions for this procedure. [facility] has recommended that you wear a Zio AT Live monitor.   This monitor is a medical device that records the heart's electrical activity. Doctors most often use these monitors to diagnose arrhythmias. Arrhythmias are problems with the speed or rhythm of the heartbeat. The monitor is a small device applied to your chest. You can wear one while you do your normal daily activities. While wearing this monitor if you have any symptoms to push the button and record what you felt. Once you have worn this monitor for the period of time provider prescribed (Usually 14 days), you will return the monitor device in the postage paid box/bag. Once it is returned they will download the data collected and provide [place] with a report which the provider will then review and we  will call you with those results.   Important tips:  Avoid showering during the first 24 hours of wearing the monitor. Avoid excessive sweating to help maximize wear time. Do not submerge the device, no hot tubs, and no swimming pools. Keep any lotions or oils away from the patch. After 24 hours you may shower with the patch on. Take brief showers with your back facing the shower head.  Do not remove patch once it has been placed because that will interrupt data and decrease adhesive wear time. Push the button when you have any symptoms and write down what you were feeling. Once you have completed wearing your monitor, remove and place into box which has postage paid and place in your outgoing mailbox.  If for some reason you have misplaced your box then call our office and we can provide another box and/or mail it off for you. Keep the transmitter within 10 feet at all times.  Expect a welcome phone call within 60-73 hrs of application from [place].  This call will include your copay information, so please answer any unknown phone calls while wearing Zio (it could also be important information about your heart) The envelope to return Zio is in the back of the transmitter. Removal instructions are on the last page of the symptom diary.  Place the patch sticky side up inside the transmitter and the symptom diary inside the envelope to return on your last wear day inside your mailbox or any USPS mailbox.    Follow-Up: At [facility],  you and your health needs are our priority.  As part of our continuing mission to provide you with exceptional heart care, we have created designated Provider Care Teams.  These Care Teams include your primary Cardiologist (physician) and Advanced Practice Providers (APPs -  Physician Assistants and Nurse Practitioners) who all work together to provide you with the care you need, when you need it.  We recommend signing up for the patient portal called "MyChart".   Sign up information is provided on this After Visit Summary.  MyChart is used to connect with patients for Virtual Visits (Telemedicine).  Patients are able to view lab/test results, encounter notes, upcoming appointments, etc.  Non-urgent messages can be sent to your provider as well.   To learn more about what you can do with MyChart, go to [email or web address].    Your next appointment:   Follow up as scheduled in September with Dr. Oval Linsey. If we note anything of concern with your testing we will bring you back sooner.   Other Instructions Heart Healthy Diet Recommendations: A low-salt diet is recommended. Meats should be grilled, baked, or boiled. Avoid fried foods. Focus on lean protein sources like fish or chicken with vegetables and fruits. The American Heart Association is a Microbiologist!  American Heart Association Diet and Lifeystyle Recommendations   Exercise recommendations: The American Heart Association recommends 150 minutes of moderate intensity exercise weekly. Try 30 minutes of moderate intensity exercise 4-5 times per week. This could include walking, jogging, or swimming.   Important Information About Sugar

## 2022-03-06 LAB — THYROID PANEL WITH TSH
Free Thyroxine Index: 3.2 (ref 1.2–4.9)
T3 Uptake Ratio: 30 % (ref 24–39)
T4, Total: 10.5 ug/dL (ref 4.5–12.0)
TSH: 1.02 u[IU]/mL (ref 0.450–4.500)

## 2022-03-07 ENCOUNTER — Encounter (HOSPITAL_BASED_OUTPATIENT_CLINIC_OR_DEPARTMENT_OTHER): Payer: Self-pay | Admitting: Family

## 2022-03-10 DIAGNOSIS — M25551 Pain in right hip: Secondary | ICD-10-CM | POA: Diagnosis not present

## 2022-03-10 DIAGNOSIS — M533 Sacrococcygeal disorders, not elsewhere classified: Secondary | ICD-10-CM | POA: Diagnosis not present

## 2022-03-19 ENCOUNTER — Ambulatory Visit (INDEPENDENT_AMBULATORY_CARE_PROVIDER_SITE_OTHER): Payer: Federal, State, Local not specified - PPO

## 2022-03-19 ENCOUNTER — Encounter (HOSPITAL_BASED_OUTPATIENT_CLINIC_OR_DEPARTMENT_OTHER): Payer: Self-pay | Admitting: Cardiovascular Disease

## 2022-03-19 DIAGNOSIS — R55 Syncope and collapse: Secondary | ICD-10-CM

## 2022-03-19 LAB — ECHOCARDIOGRAM COMPLETE
AR max vel: 2.38 cm2
AV Area VTI: 2.39 cm2
AV Area mean vel: 2.28 cm2
AV Mean grad: 4 mmHg
AV Peak grad: 7.4 mmHg
Ao pk vel: 1.36 m/s
Area-P 1/2: 4.31 cm2
S' Lateral: 2.33 cm

## 2022-03-20 ENCOUNTER — Encounter (HOSPITAL_BASED_OUTPATIENT_CLINIC_OR_DEPARTMENT_OTHER): Payer: Self-pay

## 2022-04-04 ENCOUNTER — Other Ambulatory Visit (HOSPITAL_BASED_OUTPATIENT_CLINIC_OR_DEPARTMENT_OTHER): Payer: Self-pay | Admitting: Family

## 2022-04-04 DIAGNOSIS — F439 Reaction to severe stress, unspecified: Secondary | ICD-10-CM

## 2022-04-04 NOTE — Progress Notes (Signed)
Received note from patient via MyChart scheduling that she is interested in seeing psychology. Referral placed.   Loel Dubonnet, NP

## 2022-04-14 ENCOUNTER — Telehealth (HOSPITAL_BASED_OUTPATIENT_CLINIC_OR_DEPARTMENT_OTHER): Payer: Self-pay

## 2022-04-14 NOTE — Telephone Encounter (Addendum)
Results called to patient who verbalizes understanding!     ----- Message from Loel Dubonnet, NP sent at 04/10/2022  9:09 AM EDT ----- Monitor with predominantly normal sinus rhythm. Occasional episode of a fast heart beat called SVT that was very short lasting only 12 beats. No significant arrhythmia that would cause syncope. Overall a good result!

## 2022-04-17 ENCOUNTER — Ambulatory Visit (INDEPENDENT_AMBULATORY_CARE_PROVIDER_SITE_OTHER): Payer: Federal, State, Local not specified - PPO

## 2022-04-17 DIAGNOSIS — Z0000 Encounter for general adult medical examination without abnormal findings: Secondary | ICD-10-CM

## 2022-04-18 ENCOUNTER — Other Ambulatory Visit: Payer: Federal, State, Local not specified - PPO

## 2022-04-18 ENCOUNTER — Ambulatory Visit: Payer: Federal, State, Local not specified - PPO | Admitting: Hematology

## 2022-04-18 NOTE — Progress Notes (Unsigned)
Appointment Outcome: Completed, Session #: 71-month f/u Start time:  4:32pm  End time: 5:00pm   Total Mins: 28 minutes  AGREEMENTS SECTION   Overall Goal(s): Improve healthy eating habits  Increase physical activity   Agreement/Action Steps:  Improve healthy eating habits Eat cake/sweets 2-3 times a week Drink mini (10 -12 oz.) can sodas instead of larger volume of soda Maintain eating healthy snacks  Read food labels Monitor consuming 25 g of sugar or less per day Monitor consuming 1,500-2,000mg  of sodium per day Drink 64-100 oz of water per day   Increase physical activity Exercise for at least 30-45 minutes 3-4 days per week Investigate the cost of a gym membership at [employer] Walk 3-4 days per week for 30-45 minutes   Progress Notes:  Patient stated that up until three weeks ago, she was able to maintain walking 4-5 days per week for 45-60 minutes each time. Patient shared that she would walk four times around the block on these days. Patient shared that she has not walked consistently as before due to having a fainting episode and not feeling comfortable walking alone and having the experience again with no one to help her. Patient has not acquired a gym membership at this time.   Patient stated that she started eating bad after her experience with fainting to cope with being worried. Patient shared that she gained 10lbs since the incident. Patient mentioned that she has been working to get back on track within the last week. Patient mentioned that since she was not cooking after the incident, her mother would come over and they would go out to eat. Patient reported that she did buy a piece a cake per week and ate the cake over two weeks, but not consecutive days. Patient expressed that she has been beating herself up about going back to eating cake regularly.   Patient stated that she was eating trail mix for her snack but has resumed eating honey bbq chips and honey wheat  pretzels. Patient believes that she has been eating more pretzels than chips. Patient reported that she would buy two bags of each at a time. Patient mentioned that she is working from home still, so she is trying to eat what she has available. Patient stated that she finds herself eating boiled eggs, zucchini, and squash. Patient shared that sometimes she would have her son bring her Lance Muss.   Patient reported that she has been able to maintain drinking a minimum of 6 bottles per day 5 days per week. Patient stated that she is aiming for 8 bottles per day. Patient shared that she is only able to drink 3 bottles of water per day on the weekends due to being busy and running errands. Patient stated that she had considered leaving bottles of water in the car but was unsure about leaving plastic bottles in the heat. Discussed with patient if she had a metal container that she could use to store water and ice when she is away from her home. Patient stated that she had not thought about her 1/2-gallon container that she used to take to work that she could use on the weekends.   Patient mentioned that she continues to drink 10-12 oz cans of soda. Patient reported that she was drinking a can of Pepsi every other day but has not had any Pepsi recently. Patient shared that drinking soda would make her want to drink more, but she was able to talk herself out of having  more Pepsi at the time. Patient stated that after seeing a PA instead of Dr. Duke Salvia and she was told that she could drink Ginger Ale because it doesn't have as much caffeine, she has found herself drinking 2 sodas per day. Patient shared that she typically would have one at lunch and after work.     Indicators of Success and Accountability:  Patient has resumed working on implementing her action steps to improve her healthy eating habits. Readiness: Patient is in the action phase of improving healthy eating habits and increasing physical activity.   Strengths and Supports: Patient is being supported by her family. Patient is conscious of her eating behaviors and triggers.  Challenges and Barriers: Patient is concerned about walking too often without someone around due to fainting spell.    Coaching Outcomes: Patient will continue to work towards resuming implementation of her action steps as she was prior to her health scare. Patient will work to reduce her sugar consumption and stated that she will not buy a piece of cake to avoid eating too many sweets. Patient will continue to reduce her fast-food consumption and resume cooking more at home so that she can read food labels, monitor her sugar and sodium consumption, and choose healthier snack options.   Patient mentioned that she could walk up and down her 17 stairs for exercise when someone is home to monitor her or find an exercise video on YouTube to follow to maintain her exercise. Suggestion was made that if she prefers to resume walking around her neighborhood to find a walking partner that Patient stated that she has a treadmill at home but walking on it causes her knees to hurt.   Patient expressed that she would be interested in participating in PREP again if that was an option. Sent message to Dr. Duke Salvia to determine the possibility of participating in the program again.  Patient has revised her action steps to work towards increasing her physical activity as outlined below.  Increase physical activity Exercise for at least 45-60 minutes 3-5 days per week (with monitor) Climb stairs at home Follow exercise video on YouTube Walk around neighborhood with a partner  Patient will work to increase her water consumption on the weekends from approximately 50 oz to a minimum of 64 oz. By incorporating the following step as outlined below.  Improving healthy eating habits Drink 64-100 oz of water per day Use 1/2-gallon container on the weekends to aid in water  consumption   Patient has been scheduled for a 64-month f/u to ensure that she has gotten back on track with her eating habits and resumed her regular routine for exercising.     Attempted: Fulfilled - Patient was able to maintain not eating cake/sweets more than 2-3xs/week and drink mini can sodas. Partial - Patient was not able to read food labels or monitor sodium/sugar consumption when she ate out or purchased cake. Patient was able to maintain consumption of a minimum of 64 oz of water 5 out of 7 days per week. Patient was able to maintain.  Not met - Patient did not maintain eating healthy snacks only.   Not Attempted: Dropped/Revised - Patient will not pursue a gym membership at this time.

## 2022-05-01 ENCOUNTER — Telehealth (HOSPITAL_BASED_OUTPATIENT_CLINIC_OR_DEPARTMENT_OTHER): Payer: Self-pay | Admitting: *Deleted

## 2022-05-01 NOTE — Telephone Encounter (Signed)
Advised patient, verbalized understanding   Patient was inquiring if [place] had to be 14 consecutive days, advised would find out and call her back next week when return to office

## 2022-05-01 NOTE — Telephone Encounter (Signed)
-----   Message from Loel Dubonnet, NP sent at 04/30/2022  1:21 PM EDT ----- Regarding: RE: PREP I don't think she can do PREP again. Can refer to [facility] - she would get 14 days free.  Echo and ZIO ordered for syncope unremarkable. Previous syncope orthostatic in nature: recommend stay well hydrated, eat regular meals, wear compression socks, with position changes slowly.   If still with lightheadedness can order carotid duplex for further evaluation. Has upcoming follow up with TR.  Loel Dubonnet, NP  ----- Message ----- From: Earvin Hansen, LPN Sent: 4/85/4627   1:03 PM EDT To: Loel Dubonnet, NP Subject: FW: PREP                                       Thoughts?  ----- Message ----- From: Avelino Leeds Sent: 04/21/2022  10:25 AM EDT To: Skeet Latch, MD; Earvin Hansen, LPN Subject: PREP                                           Hi team,  I spoke with this patient on 8/10 for health coaching. Since she had the fainting incident she isn't confident of walking around her neighborhood/exercising at home without someone being around in case she had another event. Patient expressed that she would be interested in participating in [place] again if that was possible since she would be monitored. I'm not sure if that is an option since she completed the program earlier this year.    Thanks, Briana Price

## 2022-05-15 ENCOUNTER — Ambulatory Visit: Payer: Federal, State, Local not specified - PPO

## 2022-05-15 DIAGNOSIS — Z0000 Encounter for general adult medical examination without abnormal findings: Secondary | ICD-10-CM

## 2022-05-15 NOTE — Progress Notes (Signed)
Appointment Outcome: Completed, Session #: [id number]                        Start time: 4:30pm  End time: 4:55pm   Total Mins: 25 minutes  AGREEMENTS SECTION   Overall Goal(s): Improve healthy eating habits  Increase physical activity   Agreement/Action Steps:  Improve healthy eating habits Eat cake/sweets 2-3 times a week Drink mini (10 -12 oz.) can sodas instead of larger volume of soda Maintain eating healthy snacks  Read food labels Monitor consuming 25 g of sugar or less per day Monitor consuming 1,500-2,'000mg'$  of sodium per day Drink 64-100 oz of water per day Use 1/2-gallon container on the weekends to aid in water consumption  Increase physical activity Exercise for at least 45-60 minutes 3-5 days per week (with monitor) Climb stairs at home Follow exercise video on YouTube Walk around neighborhood with a partner   Progress Notes:  Patient reported that she has not engaged in her action steps to increase her physical activity over the past month due to the heat and her work schedule. Patient stated that she is currently working 12 hour shifts during the week and 8 hours on Saturdays. Patient shared that when she gets home at 9:00pm she is tired and does not have the energy to exercise. Patient mentioned that she walks around the building at work but don't think that it equates the walk around her neighborhood that she was doing.   Patient stated that if she were to engage in using a YouTube video to exercise indoors, she would need to find a dance exercise to keep her interested.   Patient mentioned that she has been experiencing swelling in her feet over the past three weeks. Patient questioned if she needs a diuretic or a change in her blood pressure medication. Patient stated that she has continued to drink her water by drinking 6-16.9 oz bottles of water at work and 8-16.9 oz bottles of water at home on her days off.  Patient stated that she has been monitoring her  sodium intake more closely. Patient reported that she stopped eating the BBQ chips and pretzels that she talked about during the previous session. Patient mentioned that her mother introduced her to dried veggie chips from [employer] contains sweet potato, purple sweet potato, squash, green beans, taro, and carrots. Patient shared that each 1 oz serving contains '125mg'$  of sodium and the container has 8 servings (1,'000mg'$  of sodium per container). Patient has continued to read food labels to help her manage her sodium intake.  Patient shared that she doesn't have a lot of sodium during the day because she doesn't eat much. Patient mentioned that sometimes she doesn't eat dinner and have small meals for breakfast and lunch. Patient stated that today she had some veggie chips and water for lunch and a bagel for breakfast. Patient feels that her eating habits may still be off due to when she eats and not what she eats. Patient stated that she ate this way because she was tired the night before to prep her meals. Patient stated that typically she has salmon or tuna for lunch/dinner. Patient mentioned that she eats more vegetables than meat. Patient stated that she has not eaten fast food.   Patient stated that she has cut down on the sweets and has had a slice of cake 2 times over the past month compared to once weekly. Patient stated that she is  not buying ice cream or other sweets regularly but did have cookies from [employer] in the past week. Patient stated that she is replacing her sweets with fresh fruit such as kiwi, grapes, and pineapples. Patient mentioned that she doesn't track the sugar content in her fruit but doesn't think that she is consuming a lot of sugar since she has cut down on the cake.    Indicators of Success and Accountability:  Patient stated that she is drinking more water than before and eating less sweets.  Readiness: Patient is in the action stage of improving healthy eating  habits and preparation stage of increasing physical activity.  Strengths and Supports: Patient is being supported by her family. Patient is relying on discipline and motivation. Challenges and Barriers: Patient's work schedule is a barrier to engaging in physical activity.    Coaching Outcomes: Patient will continue to implement her action steps towards improving healthy eating behaviors outlined above over the next month. Patient will resume her action steps towards increasing physical activity once her work schedule returns to normal. In the meantime, patient will still engage in activities such as walking at work and at events, shopping, etc. on the weekends.  Discussed with patient the sugar content that is in condiments and natural sugar in fruits that contribute to her sugar intake along with other foods to help track her sugar consumption more closely. Patient will be working to reduce her sugar intake more because she has been beating herself up about gaining weight.  Sent message to Laurann Montana, NP regarding the patient's concern with swelling and medication for further follow up.    Attempted: Fulfilled - Patient was able to complete her biweekly challenge to improve her healthy eating habits.     Not attempted: Deferred - Patient expressed that increasing her physical activity is important but due to her work schedule, she does not have the energy to engage in exercise after getting home late but will resume after her schedule returns to normal.

## 2022-05-16 ENCOUNTER — Telehealth (HOSPITAL_BASED_OUTPATIENT_CLINIC_OR_DEPARTMENT_OTHER): Payer: Self-pay | Admitting: Family

## 2022-05-16 MED ORDER — SPIRONOLACTONE 25 MG PO TABS: 12.5000 mg | ORAL_TABLET | Freq: Every day | ORAL | 3 refills | Status: DC

## 2022-05-16 MED ORDER — AMLODIPINE BESYLATE 5 MG PO TABS: 5.0000 mg | ORAL_TABLET | Freq: Every day | ORAL | 3 refills | Status: DC

## 2022-05-16 NOTE — Telephone Encounter (Signed)
Left message for patient to call back and sent mychart message to patient with the following recommendations. Orders placed.         "Receive staff message from her caregiver that she spoke with Miss Munar who has been experiencing swelling in her legs and the feet of the past 3 weeks despite following a low-sodium diet.  May be side effect of high-dose amlodipine 10 mg.   We will ask our nursing team to reach out to her with the following recommendations: Reduce amlodipine to 5 mg daily Start spironolactone 12.5 mg daily (previously tolerated well)  BMP at upcoming follow up 05/26/22 (added to appt notes)   Loel Dubonnet, NP"

## 2022-05-16 NOTE — Telephone Encounter (Signed)
Receive staff message from her caregiver that she spoke with Briana Price who has been experiencing swelling in her legs and the feet of the past 3 weeks despite following a low-sodium diet.  May be side effect of high-dose amlodipine 10 mg.  We will ask our nursing team to reach out to her with the following recommendations: Reduce amlodipine to 5 mg daily Start spironolactone 12.5 mg daily (previously tolerated well)  BMP at upcoming follow up 05/26/22 (added to appt notes)  Loel Dubonnet, NP

## 2022-05-16 NOTE — Addendum Note (Signed)
Addended by: Gerald Stabs on: 05/16/2022 09:46 AM   Modules accepted: Orders

## 2022-05-23 NOTE — Progress Notes (Signed)
Advanced Hypertension Clinic Follow-up:    Date:  05/26/2022   ID:  Briana Price, Briana Price Aug 16, 1966, MRN [id number]  PCP:  [name], MD  Cardiologist:  Skeet Latch, MD  Nephrologist:  Referring MD: [name], MD   CC: Hypertension  History of Present Illness:    Briana Price is a 56 y.o. female with a hx of anemia, arthritis, breast cancer, GERD, and hypertension, here for follow-up. She was initially seen in the [facility] 05/07/2021. She saw Dr. Garwin Brothers on 01/2021 and her blood pressure was 138/98 on HCTZ and Losartan, so she was referred to advanced hypertension clinic. She has a history of breast cancer and underwent bilateral mastectomy. She saw oncology 04/2021 and her blood pressure was well controlled. Her blood pressure averaged [id number] systolic at home. She also reported a few episodes of a "pulling," positional chest pain randomly at rest. She had some dyspnea when climbing inclines or stairs. She was referred for the PREP program and advised to limit sodium to 1500-2000 mg daily.   She was fatigued and struggling to stay motivated. She had started the PREP program and was trying to walk for exercise, but was limited by left knee pain. She continued to make progress with dietary changes, including cutting back on sweets and sodas. However, her blood pressure remained poorly controlled at home and in clinic. Losartan was switched to amlodipine 10 mg daily. She was enrolled in the [facility] remote patient monitoring study. She followed up with our pharmacist 11/01/2021 and her BP was 130/88. Her at home readings averaged 127/86 per [employer]. A few days prior to that appointment she had an episode of dull aching chest pain lasting 10 minutes. HCTZ was discontinued, and she was started on chlorthalidone 25 mg daily. She has not been checking her blood pressure regularly, but when she does her readings average 110-130s/70-90s. She continues to work with our  Engineer, maintenance. She followed up with oncology 11/2021 and was doing well.  She was seen by Laurann Montana, NP 02/2022 for syncope. Chest CT was negative for PE. She was reportedly hypotensive with EMS, but not by the time she got to the ED. The episode occurred after not eating, getting up quickly, and walking down the stairs. Blood pressures at home were in the 110s/70s-80s. Spironolactone was discontinued. She wore a 14 day monitor that showed rare PACs and PVCs, and short runs of SVT. She had an Echo 03/2022 that revealed LVEF 60-65% and normal diastolic function.  Today, she reports noticing more swelling in her ankles R>L, worse in the past 3 weeks. The swelling may become worse so that "you can't see her ankles." She believes her swelling may be progressing to her calves soon. She has not tried to wear compression socks, and she notes that she would need accommodations to implement a strategy of walking every so often at work at the [employer]. For the past 2-3 weeks she has been working 12 hour shifts. Usually she sits most of the day aside from a couple 15 min breaks and a 30 min lunch. She has fallen out of the habit of walking for exercise, and has noticed gaining some weight. Every now and then she gets "a funny feeling in her heart" with inspiration. Lately she has not been monitoring her BP at home regularly. She believes it has been fairly controlled except for today in clinic which was 136/72. She denies any palpitations, or shortness of breath. No lightheadedness, headaches,  syncope, orthopnea, or PND.  Previous Antihypertensives: Lisinopril  Past Medical History:  Diagnosis Date   Anemia    Ankle edema 05/26/2022   Arthritis    Breast cancer ([facility])    Cancer ([facility])    left breast cancer   Family history of breast cancer    Genetic testing 05/28/2017   Common Cancers panel (46 genes) @ [facility] - No pathogenic mutations detected   GERD (gastroesophageal reflux disease)    Hair loss  09/12/2021   Headache    Migraines   Hypertension    Non-healing surgical wound    right chest wall    Past Surgical History:  Procedure Laterality Date   BREAST SURGERY     biopsy   CESAREAN SECTION     CHOLECYSTECTOMY N/A 02/07/2013   Procedure: LAPAROSCOPIC CHOLECYSTECTOMY WITH INTRAOPERATIVE CHOLANGIOGRAM;  Surgeon: Gwenyth Ober, MD;  Location: [facility];  Service: General;  Laterality: N/A;   COLONOSCOPY W/ BIOPSIES AND POLYPECTOMY     COLONOSCOPY W/ BIOPSIES AND POLYPECTOMY     EXCISION OF BREAST LESION Right 06/08/2019   Procedure: EXCISION OF NON HEALING WOUND RIGHT CHEST;  Surgeon: [name], MD;  Location: [facility];  Service: General;  Laterality: Right;   FOOT SURGERY  2003   lt foot bunionectomy   MASTECTOMY W/ SENTINEL NODE BIOPSY Bilateral 11/19/2017   Procedure: BILATERAL MASTECTOMIES WITH LEFT SENTINEL LYMPH NODE BIOPSY;  Surgeon: [name], MD;  Location: [facility];  Service: General;  Laterality: Bilateral;   MULTIPLE TOOTH EXTRACTIONS     PORTACATH PLACEMENT Right 05/19/2017   Procedure: INSERTION PORT-A-CATH;  Surgeon: [name], MD;  Location: [facility];  Service: General;  Laterality: Right;    Current Medications: Current Meds  Medication Sig   acetaminophen (TYLENOL) 500 MG tablet Take 1,000 mg by mouth every 8 (eight) hours as needed for moderate pain or headache.   amLODipine (NORVASC) 5 MG tablet Take 1 tablet (5 mg total) by mouth daily.   Ascorbic Acid (VITAMIN C) 1000 MG tablet Take 1,000 mg by mouth daily.   atorvastatin (LIPITOR) 20 MG tablet Take 1 tablet by mouth daily.   Multiple Vitamins-Minerals (HAIR/SKIN/NAILS) CAPS Take 3 capsules by mouth daily.   Prenatal Vit-Fe Fumarate-FA (PRENATAL VITAMIN PO) Take by mouth daily.   spironolactone (ALDACTONE) 25 MG tablet Take 0.5 tablets (12.5 mg total) by mouth daily.     Allergies:   Patient has no known allergies.   Social History   Socioeconomic History    Marital status: Married    Spouse name: Not on file   Number of children: Not on file   Years of education: Not on file   Highest education level: Not on file  Occupational History   Not on file  Tobacco Use   Smoking status: Former    Types: Cigarettes   Smokeless tobacco: Never   Tobacco comments:    15-20 years ago  Vaping Use   Vaping Use: Never used  Substance and Sexual Activity   Alcohol use: No   Drug use: No   Sexual activity: Not on file  Other Topics Concern   Not on file  Social History Narrative   Tobacco use   Cigarettes: Never smoked    Tobacco history last updated 01/23/2014   No smoking   No tobacco exposure   No alcohol   Caffeine : yes soda   No recreational drug use   Occupation :employed ,[employer] 01/25/2013  Martial status : single    Children: Louie Casa 01/25/2013   53 year old son   Social Determinants of Health   Financial Resource Strain: Not on file  Food Insecurity: Not on file  Transportation Needs: Not on file  Physical Activity: Not on file  Stress: Not on file  Social Connections: Not on file     Family History: The patient's family history includes Breast cancer in her maternal grandmother and other family members; Breast cancer (age of onset: 63) in her paternal aunt; Breast cancer (age of onset: 43) in her maternal aunt; Heart attack in her maternal grandfather; Hypertension in her mother; Leukemia in her paternal uncle.  ROS:   Please see the history of present illness.    (+) Bilateral ankle edema R>L (+) Chest discomfort with inspiration All other systems reviewed and are negative.  EKGs/Labs/Other Studies Reviewed:    Monitor  04/2022: 14 Day Zio Monitor   Quality: Fair.  Baseline artifact. Predominant rhythm: sinus rhythm Average heart rate: 86 bpm Max heart rate: 149 bpm Min heart rate: 62 bpm Pauses >2.5 seconds: none   Rare PACs or PVCs Short runs of SVT up to 6 beats  Echo  03/19/2022:  1. Left  ventricular ejection fraction, by estimation, is 60 to 65%. Left  ventricular ejection fraction by 3D volume is 62 %. The left ventricle has  normal function. The left ventricle has no regional wall motion  abnormalities. Left ventricular diastolic   parameters were normal.   2. Right ventricular systolic function is normal. The right ventricular  size is normal. There is normal pulmonary artery systolic pressure.   3. The mitral valve is normal in structure. Trivial mitral valve  regurgitation. No evidence of mitral stenosis.   4. The aortic valve is tricuspid. Aortic valve regurgitation is not  visualized.   5. The inferior vena cava is normal in size with greater than 50%  respiratory variability, suggesting right atrial pressure of 3 mmHg.   Comparison(s): Compared to prior TTE in 2019, there is no significant  change.   CTA Chest  02/28/2022: FINDINGS: Cardiovascular: Satisfactory opacification of the pulmonary arteries to the segmental level. No evidence of pulmonary embolism. Normal heart size. No pericardial effusion.   Mediastinum/Nodes: No enlarged mediastinal, hilar, or axillary lymph nodes. Thyroid gland, trachea, and esophagus demonstrate no significant findings.   Lungs/Pleura: Lungs are clear. No pleural effusion or pneumothorax.   Upper Abdomen: No acute abnormality.   Musculoskeletal: No chest wall abnormality. No acute or significant osseous findings.   Review of the MIP images confirms the above findings.   IMPRESSION: No pulmonary emboli. No acute abnormality. No cause is for syncope identified.  TTE 02/15/2018: - Left ventricle: The cavity size was normal. Wall thickness was    increased in a pattern of mild LVH. Systolic function was normal.    The estimated ejection fraction was in the range of 60% to 65%.    Wall motion was normal; there were no regional wall motion    abnormalities. Doppler parameters are consistent with abnormal    left  ventricular relaxation (grade 1 diastolic dysfunction).  - Impressions: GLS -19.9%. LS&' 9.8 cm/s.   CT Chest 12/28/2017: IMPRESSION: No evidence of recurrent or metastatic carcinoma. No other acute findings.   3.5 cm right uterine fibroid.   Colonic diverticulosis, without radiographic evidence of diverticulitis.  EKG:  EKG is personally reviewed. 05/26/2022:  EKG was not ordered. 12/31/2021: EKG was not ordered. 09/12/2021: EKG  was not ordered. 05/07/2021: Sinus rhythm. Rate 74 bpm. LVH.  Recent Labs: 02/28/2022: ALT 41; BUN 10; Creatinine, Ser 0.85; Hemoglobin 13.6; Platelets 368; Potassium 4.3; Sodium 141 03/05/2022: TSH 1.020   Recent Lipid Panel No results found for: "CHOL", "TRIG", "HDL", "CHOLHDL", "VLDL", "LDLCALC", "LDLDIRECT"  Physical Exam:    VS:  BP 126/80 (BP Location: Right Arm, Patient Position: Sitting, Cuff Size: Large)   Pulse 78   Ht '5\' 6"'$  (1.676 m)   Wt 213 lb (96.6 kg)   BMI 34.38 kg/m  , BMI Body mass index is 34.38 kg/m. GENERAL:  Well appearing HEENT: Pupils equal round and reactive, fundi not visualized, oral mucosa unremarkable NECK:  No jugular venous distention, waveform within normal limits, carotid upstroke brisk and symmetric, no bruits LUNGS:  Clear to auscultation bilaterally HEART:  RRR.  PMI not displaced or sustained,S1 and S2 within normal limits, no S3, no S4, no clicks, no rubs, no murmurs ABD:  Flat, positive bowel sounds normal in frequency in pitch, no bruits, no rebound, no guarding, no midline pulsatile mass, no hepatomegaly, no splenomegaly EXT:  2 plus pulses throughout, L>R ankle edema, no cyanosis no clubbing SKIN:  No rashes no nodules NEURO:  Cranial nerves II through XII grossly intact, motor grossly intact throughout PSYCH:  Cognitively intact, oriented to person place and time   ASSESSMENT/PLAN:    Essential (primary) hypertension Blood pressure is a little higher today than it has been at home.  She isn't checking it  often.  She notes that she has been nervous about walking since she had her syncopal episode and has gained 15 pounds.  She is going to continue amlodipine and spironolactone.  We discussed the fact that her blood pressure was getting low because she had lost weight.  She wants to get off medicines if at all possible.  She is going to get back on her exercise routine and we will see if with weight loss she can further reduce her medicine.  Continue current doses for now.  Mixed hyperlipidemia Continue atorvastatin, diet, and exercise.  Ankle edema She is sitting all day at work and has increased LE edema.  Recommend compression socks and walking at least 5 minutes every 2 hours.  Will give her a work note.    Screening for Secondary Hypertension:     05/07/2021    3:38 PM  Causes  Drugs/Herbals Screened     - Comments caffeine, sodium  Renovascular HTN N/A  Sleep Apnea Screened     - Comments snores.  no apea or daytime somnolence  Thyroid Disease Screened  Hyperaldosteronism N/A  Pheochromocytoma N/A  Cushing's Syndrome N/A  Hyperparathyroidism N/A  Coarctation of the Aorta Screened     - Comments unable to check BP bilaterally 2/2 s/p mastectomy and LN removal  Compliance Screened    Relevant Labs/Studies:    Latest Ref Rng & Units 02/28/2022   12:44 PM 12/31/2021    2:22 PM 12/06/2021   12:16 PM  Basic Labs  Sodium 135 - 145 mmol/L 141  140  144   Potassium 3.5 - 5.1 mmol/L 4.3  5.1  4.5   Creatinine 0.44 - 1.00 mg/dL 0.85  0.65  0.66        Latest Ref Rng & Units 03/05/2022   11:01 AM 09/12/2021    2:48 PM  Thyroid   TSH 0.450 - 4.500 uIU/mL 1.020  0.928     Disposition:    FU with Naomi Castrogiovanni C. Oval Linsey,  MD, [facility] in 4 months.   Medication Adjustments/Labs and Tests Ordered: Current medicines are reviewed at length with the patient today.  Concerns regarding medicines are outlined above.   No orders of the defined types were placed in this encounter.  No orders of the  defined types were placed in this encounter.  I,Mathew Stumpf,acting as a Education administrator for Skeet Latch, MD.,have documented all relevant documentation on the behalf of Skeet Latch, MD,as directed by  Skeet Latch, MD while in the presence of Skeet Latch, MD.  I, [place] Oval Linsey, MD have reviewed all documentation for this visit.  The documentation of the exam, diagnosis, procedures, and orders on 05/26/2022 are all accurate and complete.  Signed, Skeet Latch, MD  05/26/2022 9:03 AM    [place]

## 2022-05-26 ENCOUNTER — Telehealth: Payer: Self-pay | Admitting: Cardiovascular Disease

## 2022-05-26 ENCOUNTER — Encounter (HOSPITAL_BASED_OUTPATIENT_CLINIC_OR_DEPARTMENT_OTHER): Payer: Self-pay | Admitting: *Deleted

## 2022-05-26 ENCOUNTER — Ambulatory Visit (HOSPITAL_BASED_OUTPATIENT_CLINIC_OR_DEPARTMENT_OTHER): Payer: Federal, State, Local not specified - PPO | Admitting: Cardiovascular Disease

## 2022-05-26 ENCOUNTER — Encounter (HOSPITAL_BASED_OUTPATIENT_CLINIC_OR_DEPARTMENT_OTHER): Payer: Self-pay | Admitting: Cardiovascular Disease

## 2022-05-26 DIAGNOSIS — I10 Essential (primary) hypertension: Secondary | ICD-10-CM | POA: Diagnosis not present

## 2022-05-26 DIAGNOSIS — M25473 Effusion, unspecified ankle: Secondary | ICD-10-CM | POA: Diagnosis not present

## 2022-05-26 DIAGNOSIS — E782 Mixed hyperlipidemia: Secondary | ICD-10-CM | POA: Diagnosis not present

## 2022-05-26 HISTORY — DX: Effusion, unspecified ankle: M25.473

## 2022-05-26 NOTE — Assessment & Plan Note (Signed)
Continue atorvastatin, diet, and exercise.

## 2022-05-26 NOTE — Telephone Encounter (Signed)
Pt states she is returning a call from our office.

## 2022-05-26 NOTE — Patient Instructions (Signed)
Medication Instructions:  Your physician recommends that you continue on your current medications as directed. Please refer to the Current Medication list given to you today.   Labwork: NONE  Testing/Procedures: NONE  Follow-Up: 09/30/2022 9:00 AM WITH DR [place]   Any Other Special Instructions Will Be Listed Below (If Applicable). TRY COMPRESSION STOCKINGS, MEDIUM STRENGTH   If you need a refill on your cardiac medications before your next appointment, please call your pharmacy.

## 2022-05-26 NOTE — Assessment & Plan Note (Signed)
She is sitting all day at work and has increased LE edema.  Recommend compression socks and walking at least 5 minutes every 2 hours.  Will give her a work note.

## 2022-05-26 NOTE — Assessment & Plan Note (Signed)
Blood pressure is a little higher today than it has been at home.  She isn't checking it often.  She notes that she has been nervous about walking since she had her syncopal episode and has gained 15 pounds.  She is going to continue amlodipine and spironolactone.  We discussed the fact that her blood pressure was getting low because she had lost weight.  She wants to get off medicines if at all possible.  She is going to get back on her exercise routine and we will see if with weight loss she can further reduce her medicine.  Continue current doses for now.

## 2022-05-26 NOTE — Telephone Encounter (Signed)
Spoke with patient and it was actually a call from her son's cardiologist, she needs nothing at this time

## 2022-05-26 NOTE — Telephone Encounter (Signed)
Hey did you call this patient, I don't see any note of [place] calling her

## 2022-05-27 ENCOUNTER — Ambulatory Visit (HOSPITAL_BASED_OUTPATIENT_CLINIC_OR_DEPARTMENT_OTHER): Payer: Federal, State, Local not specified - PPO | Admitting: Cardiovascular Disease

## 2022-06-09 NOTE — Telephone Encounter (Signed)
Discussed at follow up visit

## 2022-06-12 ENCOUNTER — Ambulatory Visit: Payer: Federal, State, Local not specified - PPO | Attending: Cardiology

## 2022-06-12 DIAGNOSIS — Z0000 Encounter for general adult medical examination without abnormal findings: Secondary | ICD-10-CM

## 2022-06-12 NOTE — Progress Notes (Signed)
Appointment Outcome: Completed, Session #: [id number]/u                       Start time: 4:31pm   End time: 5:00pm   Total Mins: 29 minutes  AGREEMENTS SECTION   Overall Goal(s): Improve healthy eating habits  Increase physical activity   Agreement/Action Steps:  Improve healthy eating habits Eat cake/sweets 2-3 times a week Drink mini (10 -12 oz.) can sodas instead of larger volume of soda Maintain eating healthy snacks  Read food labels Monitor consuming 25 g of sugar or less per day Monitor consuming 1,500-2,'000mg'$  of sodium per day Drink 64-100 oz of water per day Use 1/2-gallon container on the weekends to aid in water consumption   Increase physical activity Exercise for at least 45-60 minutes 3-5 days per week (with monitor) Climb stairs at home Follow exercise video on YouTube Walk around neighborhood with a partner  Progress Notes:  Patient stated that she has not been able to get back into an exercise routine since she experienced fainting. Patient reported that she has tried to walk bout after 2 laps she was tired. Patient mentioned that she tried walking the steps in her home a few times but prefers to be outside. Patient shared that while at work she walks in 10-15 minute increments and walks about 5-6 hours out of a 10-hour work shift. Patient stated that she walks from one end of the floor of the building to the other. Patient mentioned that she works on Saturdays for 8 hours but can run errands afterwards.   Patient reported that she parks her car further from the store entrance to increase her steps. Patient expressed that her challenges to being able to get back into a routine is being tired after work and it being dark and not wanting to be outside a certain time of evening. Patient mentioned that it is dark when she wakes up at 5 am to get ready for work and must leave by a certain time to get to work due to traffic. Patient mentioned that she does have 2-15 minutes  breaks and a 30-minute lunch that she can walk with other co-workers but have not been motivated to do so yet.   Patient stated that she has not had cake since last week, but prior she would pick up 2 slices on Sunday that would last her the entire week. Patient shared that she has been able to reduce her sugar intake by not going to the store for the cake on her way home. Patient mentioned that she implements talking herself through these moments to encourage her not to get off the exit and to go home. Patient stated that she isn't tracking her sugar intake by reading food labels but try to limit how many sweets she consumes. Patient reported that she cannot eat candy frequently and instead eat dried vegetable chips. Patient mentioned that eating the dried produce causes her to drink water.   Patient mentioned that due to her work schedule she has not been cooking and eat out more. Patient shared that it does make it challenging to manage her sodium intake since she is not in control of how the food is prepared. Patient mentioned that she sticks [place] a [place] sandwich from the nearby deli for lunch. Patient shared that she hasn't been reading food labels in the store because she has not been grocery shopping in a  while but read the labels on foods that she has at home.  Patient reported that she has been able to maintain drinking 64 ounces of water per day. Patient shared that she did forget to bring her water bottles with her to work today but had 3 bottles at work available and that she will consume the remaining amount of water when she gets home. Patient stated that she has cut down on drinking sodas but drink a mini can to a 12 ounce can of ginger ale when wanting something other than water. Patient mentioned that she has had one case of ginger ale to last her for a couple of weeks.   Indicators of Success and Accountability:  Patient mentioned that trying to maintain her  water consumption daily and walking at work are her indicators of success.  Readiness: Patient is in the action stage of increasing physical activity and improving healthy eating habits.  Strengths and Supports: Patient is self-aware of her health behaviors. Challenges and Barriers: Patient's work schedule hinders her from walking at home at a time when it is light outside.   Coaching Outcomes: Patient was asked what it would take to motivate her to engage in an exercise routine again. Patient stated that she just has to do it but wasn't sure what it would take to get her there. Patient was asked what her "why" was for wanting to make these behavior changes so that she could use them as motivating factors to aid in getting back on track.  Patient will engage in the following steps to monitor and increase her physical activity over the next 85-month.   Patient will start tracking her steps while she is at work to see if she is reaching 10,000 steps per day and set a daily goal for herself. Patient will track how many total minutes she walks at work per day to determine if she is active 150 minutes per week with walking alone.   Patient will continue to cut back on eating sweets to help reduce her sugar consumption instead of focusing on tracking the number of grams of sugar she as consumed per day.   Patient has been scheduled for a 364-month/u but will be called around November 16th at 4:30pm for a short check-in for additional support.     Attempted: Fulfilled - Patient has reduced her consumption of sweets, drink mini cans of soda, maintain eating healthy snacks, and drink 64 ounces of water daily. Partial - Patient attempted to walk and climb stairs in her home over the past month but was not able to develop a routine due to work and preference to exercise outdoors. Patient read food labels on items in her home but has not tracked her sugar consumption. Patient finds it challenging to maintain  1,500-2,000 mg of sodium per day due to not cooking at home often.    Not attempted: Dropped/Revised - Patient is not tracking her sugar consumption by reading food labels and will focus on reducing the amount of cake that she consumes along with other foods that are sweet. Patient will replace working out inside her home with walking at work.

## 2022-07-14 DIAGNOSIS — Z6834 Body mass index (BMI) 34.0-34.9, adult: Secondary | ICD-10-CM | POA: Diagnosis not present

## 2022-07-14 DIAGNOSIS — G5701 Lesion of sciatic nerve, right lower limb: Secondary | ICD-10-CM | POA: Diagnosis not present

## 2022-08-07 ENCOUNTER — Telehealth: Payer: Self-pay

## 2022-08-07 NOTE — Telephone Encounter (Signed)
Notified Patient of completion of FMLA paperwork. Fax transmission confirmation received. Copy of forms mailed to Patient as requested. No other needs or concerns voiced at this time.

## 2022-08-20 ENCOUNTER — Other Ambulatory Visit: Payer: Self-pay

## 2022-08-20 DIAGNOSIS — C50512 Malignant neoplasm of lower-outer quadrant of left female breast: Secondary | ICD-10-CM

## 2022-08-20 NOTE — Progress Notes (Unsigned)
[place]   Telephone:([phone] [phone]   Clinic Follow up Note   Patient Care Team: [name], MD as PCP - General (Family Medicine) Skeet Latch, MD as PCP - Cardiology (Cardiology) [name], MD as Consulting Physician (Hematology) [name], MD as Consulting Physician (General Surgery) [name], MD as Consulting Physician (Radiation Oncology) Delice Bison, Charlestine Massed, NP as Nurse Practitioner (Hematology and Oncology) Avelino Leeds (Cardiology)  Date of Service:  08/21/2022  CHIEF COMPLAINT: f/u of left breast cancer (triple negative)   CURRENT THERAPY:  Surveillance   ASSESSMENT:  Briana Price is a 56 y.o. female with   Malignant neoplasm of lower-outer quadrant of left breast of female, estrogen receptor negative ([place]) invasive ductal carcinoma, c2T0M0, Stage IIB, triple negative, Grade 3. ypT0N0 -She was diagnosed in 04/2017. She is s/p neoadjuvant AC-T and b/l mastectomy (no reconstruction yet due to Covid pandemic) -continue cancer surveillance  -She is clinically doing well, lab reviewed, exam was unremarkable.  There is no clinical concern for recurrence. -she is 5 years out, risk of breast cancer recurrence is minimal now.  I will see her as needed in the future.  Spleen lesion  -CT from December 28, 2017 showed a 8 mm subcapsular cystic lesion, likely benign. -Patient is very concerned about this, I will order ultrasound of abdomen for follow-up.   PLAN: -Discuss the scan 2019 about spleen. -Lab reviewed - Order ultrasound of abdomen to be done in the next 2 weeks, I will call her with results. -f/u open    SUMMARY OF ONCOLOGIC HISTORY: Oncology History Overview Note  Cancer Staging Malignant neoplasm of lower-outer quadrant of left breast of female, estrogen receptor negative ([place]) Staging form: Breast, AJCC 8th Edition - Clinical: Stage IIB (cT2, cN0(f), cM0, G3, ER: Negative, PR: Negative, HER2: Negative) -  Unsigned     Malignant neoplasm of lower-outer quadrant of left breast of female, estrogen receptor negative ([place])  04/27/2017 Mammogram   IMPRESSION: 1. Highly suspicious mass within the LEFT breast at the 5:30 o'clock axis, 4 cm from the nipple, measuring 2.5 cm, corresponding to the mammographic finding and corresponding to 1 of the palpable lumps in the left breast. Ultrasound-guided biopsy is recommended. 2. Mildly prominent lymph node in the LEFT axilla, with normal cortical thickness but with questionable effacement of the fatty hilum. Ultrasound-guided core biopsy is recommended. 3. No evidence of malignancy within the RIGHT breast. Also, no evidence of malignancy within the second area of clinical concern in the upper outer left breast.   04/29/2017 Initial Diagnosis   Malignant neoplasm of lower-outer quadrant of left breast of female, estrogen receptor negative ([place])   04/29/2017 Receptors her2   Estrogen Receptor: 0%, NEGATIVE Progesterone Receptor: 0%, NEGATIVE Proliferation Marker Ki67: 90% HER2 NEGATIVE    04/29/2017 Initial Biopsy    Diagnosis 1. 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 - SEE COMMENT. 2. Lymph node, needle/core biopsy, left axilla - THERE IN NO EVIDENCE OF CARCINOMA IN 1 OF 1 LYMPH NODE (0/1).   05/14/2017 Imaging   MRI of Breast Bilateral 05/14/17  IMPRESSION: 1. 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 2. Non mass enhancement extending approximately 3.2 cm anterior to the mass in the lower inner quadrant of the left breast, suspicious for DCIS. There is no correlate on recent mammography. The overall extent  of the mass and non mass enhancement is approximately 5 cm. 3. No MRI evidence of malignancy involving the right breast. 4. No pathologic lymphadenopathy.  Upper normal sized left axillary lymph nodes, the largest of which was previously biopsied no evidence of metastatic disease.   05/15/2017 Imaging   Bone scan whole body 05/15/17 IMPRESSION: No findings specific for metastatic disease to bone.   05/15/2017 Imaging   CT CAP W contrast 05/15/17  IMPRESSION: 1. 18 mm soft tissue lesion inferior left breast compatible with known primary malignancy. 2. Mildly enlarged left axillary lymph nodes in this patient with biopsy-proven metastatic disease to the left axilla. 3. No evidence for lymphadenopathy elsewhere in the chest. No lymphadenopathy in the abdomen or pelvis. No evidence for metastatic disease in the abdomen or pelvis. 4. Tiny right perifissural lung nodule most likely subpleural lymph node. Attention on follow-up recommended. 5. 7 mm hypoattenuating lesion in the spleen, likely a cyst or pseudocyst. Attention on follow-up recommended. 6. Uterine fibroids. 7.  Aortic Atherosclerois (ICD10-170.0)   05/19/2017 Procedure   Port placement by Dr. [name] on 9/11/8   05/22/2017 - 10/09/2017 Chemotherapy    neoadjuvant chemotherpy AC every 2 weeks for 4 cycles starting 05/22/17 and completed on 07/07/17, followed by weekly paclitaxel for 12 cycles starting 07/24/17 to complete on 10/09/17    05/22/2017 Pathology Results   Diagnosis Breast, left, needle core biopsy, lower inner quad - DUCTAL CARCINOMA IN SITU. 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  The genes analyzed were the 46 genes on [facility]'s Common Cancers panel (APC, ATM, AXIN2, BARD1, BMPR1A, BRCA1, BRCA2, BRIP1, CDH1, CDKN2A, CHEK2, CTNNA1, DICER1, EPCAM, GREM1, HOXB13, KIT, MEN1, MLH1, MSH2, MSH3, MSH6, MUTYH, NBN, NF1, NTHL1, PALB2, PDGFRA, PMS2, POLD1, POLE, PTEN, RAD50, RAD51C, RAD51D, SDHA, SDHB, SDHC, SDHD, SMAD4, SMARCA4, STK11, TP53, TSC1, TSC2, VHL).      08/14/2017 Echocardiogram   EF 60-65%  Study Conclusions   - Left ventricle: The cavity size was normal. Systolic function was   normal. The estimated ejection fraction was in the range of 60%   to 65%. Wall motion was normal; there were no regional wall   motion abnormalities. Doppler parameters are consistent with   abnormal left ventricular relaxation (grade 1 diastolic   dysfunction). - Pulmonary arteries: PA peak pressure: 31 mm Hg (S).   Impressions:   - No change from September 2018 study (strain rate not performed on   that study).       10/14/2017 Imaging   MRI Breast Bilateral  IMPRESSION:  Significant improvement following neoadjuvant treatment.   No residual enhancement in the left breast at sites of known malignancy.   RECOMMENDATION: Treatment plan for known malignancy.   11/19/2017 Surgery   BILATERAL MASTECTOMIES WITH LEFT SENTINEL LYMPH NODE BIOPSY by Dr. [name] 11/19/17   11/19/2017 Pathology Results    Diagnosis 11/19/17 1. Breast, simple mastectomy, Right - FIBROCYSTIC AND FIBROADENOMATOID CHANGE. - NO MALIGNANCY IDENTIFIED. 2. Breast, simple mastectomy, Left - NO RESIDUAL CARCINOMA IDENTIFIED. - BIOPSY SITES. - FIBROCYSTIC AND FIBROADENOMATOID CHANGE. - SEE ONCOLOGY TABLE. 3. Lymph node, sentinel, biopsy, Left Axillary #1 - ONE OF ONE LYMPH NODES NEGATIVE FOR CARCINOMA (0/1). - BIOPSY SITE. 4. Lymph node, sentinel, biopsy, Left Axillary # 2 - ONE OF ONE LYMPH NODES NEGATIVE FOR CARCINOMA (0/1).   12/29/2018 Imaging   CT CAP W Contrast  IMPRESSION: No evidence of  recurrent or metastatic carcinoma. No other acute findings.   3.5 cm right uterine fibroid.   Colonic diverticulosis, without radiographic evidence of diverticulitis.        INTERVAL HISTORY:  Briana Price is here for a follow up of  left breast cancer (triple negative)   She was last seen by me on 11/12/2021 She presents to the clinic alone. Pt reports she is doing well.  She states that she is concern about a spot on her spleen.    All other systems were reviewed with the patient and are negative.  MEDICAL HISTORY:  Past Medical History:  Diagnosis Date   Anemia    Ankle edema 05/26/2022   Arthritis    Breast cancer ([place])    Cancer ([facility])    left breast cancer   Family history of breast cancer    Genetic testing 05/28/2017   Common Cancers panel (46 genes) @ [facility] - No pathogenic mutations detected   GERD (gastroesophageal reflux disease)    Hair loss 09/12/2021   Headache    Migraines   Hypertension    Non-healing surgical wound    right chest wall    SURGICAL HISTORY: Past Surgical History:  Procedure Laterality Date   BREAST SURGERY     biopsy   CESAREAN SECTION     CHOLECYSTECTOMY N/A 02/07/2013   Procedure: LAPAROSCOPIC CHOLECYSTECTOMY WITH INTRAOPERATIVE CHOLANGIOGRAM;  Surgeon: Gwenyth Ober, MD;  Location: [facility];  Service: General;  Laterality: N/A;   COLONOSCOPY W/ BIOPSIES AND POLYPECTOMY     COLONOSCOPY W/ BIOPSIES AND POLYPECTOMY     EXCISION OF BREAST LESION Right 06/08/2019   Procedure: EXCISION OF NON HEALING WOUND RIGHT CHEST;  Surgeon: [name], MD;  Location: [facility];  Service: General;  Laterality: Right;   FOOT SURGERY  2003   lt foot bunionectomy   MASTECTOMY W/ SENTINEL NODE BIOPSY Bilateral 11/19/2017   Procedure: BILATERAL MASTECTOMIES WITH LEFT SENTINEL LYMPH NODE BIOPSY;  Surgeon: [name], MD;  Location: [facility];  Service: General;  Laterality: Bilateral;   MULTIPLE TOOTH EXTRACTIONS     PORTACATH PLACEMENT Right 05/19/2017   Procedure: INSERTION PORT-A-CATH;  Surgeon: [name], MD;  Location: [facility];  Service: General;  Laterality: Right;    I have reviewed the social history and family history with the patient and they are unchanged from previous note.  ALLERGIES:  has No Known Allergies.  MEDICATIONS:  Current Outpatient Medications  Medication Sig Dispense  Refill   acetaminophen (TYLENOL) 500 MG tablet Take 1,000 mg by mouth every 8 (eight) hours as needed for moderate pain or headache.     amLODipine (NORVASC) 5 MG tablet Take 1 tablet (5 mg total) by mouth daily. 90 tablet 3   Ascorbic Acid (VITAMIN C) 1000 MG tablet Take 1,000 mg by mouth daily.     atorvastatin (LIPITOR) 20 MG tablet Take 1 tablet by mouth daily.     Multiple Vitamins-Minerals (HAIR/SKIN/NAILS) CAPS Take 3 capsules by mouth daily.     Prenatal Vit-Fe Fumarate-FA (PRENATAL VITAMIN PO) Take by mouth daily.     spironolactone (ALDACTONE) 25 MG tablet Take 0.5 tablets (12.5 mg total) by mouth daily. 45 tablet 3   No current facility-administered medications for this visit.   Facility-Administered Medications Ordered in Other Visits  Medication Dose Route Frequency Provider Last Rate Last Admin   heparin lock flush 100 unit/mL  500 Units Intravenous Once PRN [name], MD  sodium chloride flush (NS) 0.9 % injection 10 mL  10 mL Intravenous PRN [name], MD   10 mL at 08/07/17 1354   sodium chloride flush (NS) 0.9 % injection 10 mL  10 mL Intravenous PRN [name], MD        PHYSICAL EXAMINATION: ECOG PERFORMANCE STATUS: 0 - Asymptomatic  Vitals:   08/21/22 1410  BP: (!) 140/87  Pulse: 87  Resp: 18  Temp: 98.4 F (36.9 C)  SpO2: 100%   Wt Readings from Last 3 Encounters:  08/21/22 222 lb 14.4 oz (101.1 kg)  05/26/22 213 lb (96.6 kg)  03/05/22 198 lb (89.8 kg)     GENERAL:alert, no distress and comfortable SKIN: skin color, texture, turgor are normal, no rashes or significant lesions EYES: normal, Conjunctiva are pink and non-injected, sclera clear NECK: supple, thyroid normal size, non-tender, without nodularity LYMPH:  no palpable lymphadenopathy in the cervical, axillary  LUNGS: clear to auscultation and percussion with normal breathing effort HEART: regular rate & rhythm and no murmurs and no lower extremity edema ABDOMEN:abdomen soft, non-tender and  normal bowel sounds Musculoskeletal:no cyanosis of digits and no clubbing  NEURO: alert & oriented x 3 with fluent speech, no focal motor/sensory deficits BREAST:Double Mastectomy no palpable mass, no axillary adenopathy, Breast exam benign.  LABORATORY DATA:  I have reviewed the data as listed    Latest Ref Rng & Units 08/21/2022    1:58 PM 02/28/2022   12:44 PM 11/12/2021   12:41 PM  CBC  WBC 4.0 - 10.5 K/uL 8.2  9.1  8.1   Hemoglobin 12.0 - 15.0 g/dL 12.9  13.6  13.7   Hematocrit 36.0 - 46.0 % 37.4  41.4  39.3   Platelets 150 - 400 K/uL 317  368  351         Latest Ref Rng & Units 08/21/2022    1:58 PM 02/28/2022   12:44 PM 12/31/2021    2:22 PM  CMP  Glucose 70 - 99 mg/dL 144  140  115   BUN 6 - 20 mg/dL _0 Creatinine 0.44 - 1.00 mg/dL 0.60  0.85  0.65   Sodium 135 - 145 mmol/L 140  141  140   Potassium 3.5 - 5.1 mmol/L 3.6  4.3  5.1   Chloride 98 - 111 mmol/L 102  102  99   CO2 22 - 32 mmol/L 32  29  27   Calcium 8.9 - 10.3 mg/dL 9.8  9.7  10.4   Total Protein 6.5 - 8.1 g/dL 7.1  7.5    Total Bilirubin 0.3 - 1.2 mg/dL 0.8  1.3    Alkaline Phos 38 - 126 U/L 96  84    AST 15 - 41 U/L 30  48    ALT 0 - 44 U/L 31  41        RADIOGRAPHIC STUDIES: I have personally reviewed the radiological images as listed and agreed with the findings in the report. No results found.    Orders Placed This Encounter  Procedures   US Abdomen Complete    Standing Status:   Future    Standing Expiration Date:   08/21/2023    Order Specific Question:   Reason for Exam (SYMPTOM  OR DIAGNOSIS REQUIRED)    Answer:   spleen lesion    Order Specific Question:   Preferred imaging location?    Answer:   [facility]    Order Specific Question:  Release to patient    Answer:   Immediate   All questions were answered. The patient knows to call the clinic with any problems, questions or concerns. No barriers to learning was detected. The total time spent in the appointment  was 25 minutes.     [name], MD 08/21/2022   Felicity Coyer, CMA, am acting as scribe for [name], MD.   I have reviewed the above documentation for accuracy and completeness, and I agree with the above.

## 2022-08-21 ENCOUNTER — Other Ambulatory Visit: Payer: Self-pay

## 2022-08-21 ENCOUNTER — Encounter: Payer: Self-pay | Admitting: Hematology

## 2022-08-21 ENCOUNTER — Inpatient Hospital Stay: Payer: Federal, State, Local not specified - PPO | Attending: Hematology

## 2022-08-21 ENCOUNTER — Inpatient Hospital Stay (HOSPITAL_BASED_OUTPATIENT_CLINIC_OR_DEPARTMENT_OTHER): Payer: Federal, State, Local not specified - PPO | Admitting: Hematology

## 2022-08-21 VITALS — BP 140/87 | HR 87 | Temp 98.4°F | Resp 18 | Ht 66.0 in | Wt 222.9 lb

## 2022-08-21 DIAGNOSIS — Z9013 Acquired absence of bilateral breasts and nipples: Secondary | ICD-10-CM | POA: Insufficient documentation

## 2022-08-21 DIAGNOSIS — C50512 Malignant neoplasm of lower-outer quadrant of left female breast: Secondary | ICD-10-CM

## 2022-08-21 DIAGNOSIS — Z171 Estrogen receptor negative status [ER-]: Secondary | ICD-10-CM

## 2022-08-21 DIAGNOSIS — Z853 Personal history of malignant neoplasm of breast: Secondary | ICD-10-CM | POA: Diagnosis not present

## 2022-08-21 DIAGNOSIS — I10 Essential (primary) hypertension: Secondary | ICD-10-CM | POA: Diagnosis not present

## 2022-08-21 DIAGNOSIS — D7389 Other diseases of spleen: Secondary | ICD-10-CM | POA: Insufficient documentation

## 2022-08-21 LAB — CMP (CANCER CENTER ONLY)
ALT: 31 U/L (ref 0–44)
AST: 30 U/L (ref 15–41)
Albumin: 4.2 g/dL (ref 3.5–5.0)
Alkaline Phosphatase: 96 U/L (ref 38–126)
Anion gap: 6 (ref 5–15)
BUN: 10 mg/dL (ref 6–20)
CO2: 32 mmol/L (ref 22–32)
Calcium: 9.8 mg/dL (ref 8.9–10.3)
Chloride: 102 mmol/L (ref 98–111)
Creatinine: 0.6 mg/dL (ref 0.44–1.00)
GFR, Estimated: >60 mL/min (ref >60)
Glucose, Bld: 144 mg/dL — ABNORMAL HIGH (ref 70–99)
Potassium: 3.6 mmol/L (ref 3.5–5.1)
Sodium: 140 mmol/L (ref 135–145)
Total Bilirubin: 0.8 mg/dL (ref 0.3–1.2)
Total Protein: 7.1 g/dL (ref 6.5–8.1)

## 2022-08-21 LAB — CBC WITH DIFFERENTIAL (CANCER CENTER ONLY)
Abs Immature Granulocytes: 0.04 10*3/uL (ref 0.00–0.07)
Basophils Absolute: 0 10*3/uL (ref 0.0–0.1)
Basophils Relative: 1 %
Eosinophils Absolute: 0.1 10*3/uL (ref 0.0–0.5)
Eosinophils Relative: 1 %
HCT: 37.4 % (ref 36.0–46.0)
Hemoglobin: 12.9 g/dL (ref 12.0–15.0)
Immature Granulocytes: 1 %
Lymphocytes Relative: 28 %
Lymphs Abs: 2.3 10*3/uL (ref 0.7–4.0)
MCH: 29.2 pg (ref 26.0–34.0)
MCHC: 34.5 g/dL (ref 30.0–36.0)
MCV: 84.6 fL (ref 80.0–100.0)
Monocytes Absolute: 0.6 10*3/uL (ref 0.1–1.0)
Monocytes Relative: 8 %
Neutro Abs: 5.1 10*3/uL (ref 1.7–7.7)
Neutrophils Relative %: 61 %
Platelet Count: 317 10*3/uL (ref 150–400)
RBC: 4.42 MIL/uL (ref 3.87–5.11)
RDW: 12.4 % (ref 11.5–15.5)
WBC Count: 8.2 10*3/uL (ref 4.0–10.5)
nRBC: 0 % (ref 0.0–0.2)

## 2022-08-21 NOTE — Assessment & Plan Note (Signed)
invasive ductal carcinoma, c2T0M0, Stage IIB, triple negative, Grade 3. ypT0N0 -She was diagnosed in 04/2017. She is s/p neoadjuvant AC-T and b/l mastectomy (no reconstruction yet due to Covid pandemic) -continue cancer surveillance

## 2022-09-05 ENCOUNTER — Ambulatory Visit (HOSPITAL_COMMUNITY)
Admission: RE | Admit: 2022-09-05 | Discharge: 2022-09-05 | Disposition: A | Discharge status: Home / Self Care | Payer: Federal, State, Local not specified - PPO | Source: Ambulatory Visit | Attending: Hematology | Admitting: Hematology

## 2022-09-05 DIAGNOSIS — C50512 Malignant neoplasm of lower-outer quadrant of left female breast: Secondary | ICD-10-CM | POA: Insufficient documentation

## 2022-09-05 DIAGNOSIS — Z853 Personal history of malignant neoplasm of breast: Secondary | ICD-10-CM | POA: Diagnosis not present

## 2022-09-05 DIAGNOSIS — D7389 Other diseases of spleen: Secondary | ICD-10-CM | POA: Diagnosis not present

## 2022-09-05 DIAGNOSIS — R161 Splenomegaly, not elsewhere classified: Secondary | ICD-10-CM | POA: Diagnosis not present

## 2022-09-05 DIAGNOSIS — Z171 Estrogen receptor negative status [ER-]: Secondary | ICD-10-CM | POA: Insufficient documentation

## 2022-09-05 DIAGNOSIS — K760 Fatty (change of) liver, not elsewhere classified: Secondary | ICD-10-CM | POA: Diagnosis not present

## 2022-09-11 ENCOUNTER — Telehealth: Payer: Self-pay

## 2022-09-11 NOTE — Telephone Encounter (Addendum)
Called patient to relay results below. Patient stated that she does want to go thru with the MRI since the lesion is larger, she wants to make sure the cancer has not returned. Will schedule and get back to patient.      ----- Message from [name], MD sent at 09/08/2022 12:50 PM EST ----- Please let pt know the abdominal US results, the spleen lesion is still there, slightly bigger than 2019, likely benign. If pt is still concerned, will order abdominal MRI w wo contrast for further evaluation. I do not feel this is very necessary.   [name]

## 2022-09-12 ENCOUNTER — Ambulatory Visit: Payer: Federal, State, Local not specified - PPO | Attending: Cardiology

## 2022-09-12 DIAGNOSIS — Z0000 Encounter for general adult medical examination without abnormal findings: Secondary | ICD-10-CM

## 2022-09-12 NOTE — Progress Notes (Unsigned)
Appointment Outcome: Completed, Session #: 26-monthf/u                        Start time: 4:31pm   End time: 4:52pm   Total Mins: 21 minutes  AGREEMENTS SECTION   Overall Goal(s): Improve healthy eating habits  Increase physical activity   Agreement/Action Steps:  Improve healthy eating habits Eat cake/sweets 2-3 times a week Drink mini (10 -12 oz.) can sodas instead of larger volume of soda Maintain eating healthy snacks  Read food labels Monitor consuming 25 g of sugar or less per day Monitor consuming 1,500-2,'000mg'$  of sodium per day Drink 64-100 oz of water per day Use 1/2-gallon container on the weekends to aid in water consumption   Increase physical activity Exercise for at least 45-60 minutes 3-5 days per week (with monitor) Climb stairs at home Follow exercise video on YouTube Walk around neighborhood with a partner  Progress Notes:  Patient stated that she has been able to maintain drinking 6 (16.9 oz.) bottles of water daily. Patient reported that she drunk more soda during the holidays than the amount she consumed before the holidays. Patient mentioned that she was able to get back on track with minimizing the amount of soda she consumes daily now that the holidays are over.   Patient shared that she has been able to maintain not eating sweets no more than 2-3 times week. Patient stated that she bypasses the exit to her favorite cake shop to help reduce the amount of sugar she consumes. Patient mentioned that she hasn't stopped to purchase cake in two weeks, and that it's not hard to refrain from eating cake. Patient mentioned that she continues to read food labels to monitor her sodium and sugar consumption.  Patient reported that her main challenge has been able to maintain increasing her physical activity. Patient shared that she is tired after work and by the time she gets home from work it is dark outside to walk. Patient expressed that her lack of motivation comes  from her being lazy and that she knows she just need to get back into the swing of engaging in walking in the evenings.   Patient shared that although she does not walk after work, she walks frequently throughout the day while at work. Patient mentioned that she is not tracking her steps or the total number of minutes she spends walking while at work. Patient stated that she did not have a plan to increase her physical activity.    Indicators of Success and Accountability: Patient has been able to maintain the consumption of 64 oz of water per day.  Readiness: Patient is in the action phase of increasing physical activity and improving healthy eating habits.  Strengths and Supports: Patient is relying on her self-awareness. Patient is being supported by family.  Challenges and Barriers: Patient stated that her lack of energy/motivation, along with her work schedule are challenges to increasing her physical activity.   Coaching Outcomes: Discussed with patient about having an accountability partner to support her with walking. Patient shared that she has someone that if she were to ask, she would be willing to walk with her while they are on the phone. Patient shared that she plans to resume walking asap. Patient stated that it depends on the weather and would like to have a routine established before the Spring.   Asked patient if she has device to track her steps while at work. Patient  currently does not have a smart watch or is able to carry her phone on her constantly while working.   Patient did not modify her action steps during this session but was able to become self-aware of her challenges to being able to increase physical activity outside of work. Patient has been scheduled for a one-month f/u to determine if she was able to resume walking and final f/u appointments will proceed to 72-month and 633-monthcheck-ins. Patient verbally expressed understanding.    Attempted: Fulfilled - Patient  was able to maintain drinking 64 oz of water daily. Patient continues to read food labels to monitor sodium and sugar consumption. Patient has refrain from eating sweets more than 2-3 days per week such as cake. Patient  Partial - Patient consumed more soda during the holidays but was able to reduce her soda consumption by implementing action step. Not met - Patient did not attempt action steps to increase her physical activity.

## 2022-09-30 ENCOUNTER — Ambulatory Visit (HOSPITAL_BASED_OUTPATIENT_CLINIC_OR_DEPARTMENT_OTHER): Payer: Federal, State, Local not specified - PPO | Admitting: Cardiovascular Disease

## 2022-10-24 ENCOUNTER — Other Ambulatory Visit: Payer: Self-pay

## 2022-10-24 DIAGNOSIS — Z171 Estrogen receptor negative status [ER-]: Secondary | ICD-10-CM

## 2022-10-28 ENCOUNTER — Other Ambulatory Visit: Payer: Self-pay

## 2022-10-29 ENCOUNTER — Telehealth: Payer: Self-pay

## 2022-10-29 NOTE — Telephone Encounter (Signed)
Called patients sister Andi Hence to make her aware of the MRI appointment on 3-1 at 12pm at [facility]. She stated she would let her sister know the date and time. Gave her my direct line if she had any questions or concerns.

## 2022-11-07 ENCOUNTER — Ambulatory Visit (HOSPITAL_COMMUNITY): Payer: Federal, State, Local not specified - PPO

## 2022-11-10 DIAGNOSIS — I10 Essential (primary) hypertension: Secondary | ICD-10-CM | POA: Diagnosis not present

## 2022-11-10 DIAGNOSIS — E782 Mixed hyperlipidemia: Secondary | ICD-10-CM | POA: Diagnosis not present

## 2022-11-10 DIAGNOSIS — Z23 Encounter for immunization: Secondary | ICD-10-CM | POA: Diagnosis not present

## 2022-11-11 ENCOUNTER — Other Ambulatory Visit: Payer: Self-pay | Admitting: Nurse Practitioner

## 2022-11-11 ENCOUNTER — Encounter: Payer: Self-pay | Admitting: Hematology

## 2022-11-11 MED ORDER — METHOCARBAMOL 500 MG PO TABS: 500.0000 mg | ORAL_TABLET | Freq: Once | ORAL | 0 refills | Status: DC | PRN

## 2022-11-13 ENCOUNTER — Ambulatory Visit (HOSPITAL_BASED_OUTPATIENT_CLINIC_OR_DEPARTMENT_OTHER): Payer: Federal, State, Local not specified - PPO | Admitting: Cardiovascular Disease

## 2022-11-21 ENCOUNTER — Ambulatory Visit (HOSPITAL_COMMUNITY)
Admission: RE | Admit: 2022-11-21 | Discharge: 2022-11-21 | Disposition: A | Discharge status: Home / Self Care | Payer: Federal, State, Local not specified - PPO | Source: Ambulatory Visit | Attending: Hematology | Admitting: Hematology

## 2022-11-21 DIAGNOSIS — K5730 Diverticulosis of large intestine without perforation or abscess without bleeding: Secondary | ICD-10-CM | POA: Diagnosis not present

## 2022-11-21 DIAGNOSIS — Z171 Estrogen receptor negative status [ER-]: Secondary | ICD-10-CM | POA: Insufficient documentation

## 2022-11-21 DIAGNOSIS — C50512 Malignant neoplasm of lower-outer quadrant of left female breast: Secondary | ICD-10-CM | POA: Diagnosis not present

## 2022-11-21 DIAGNOSIS — N281 Cyst of kidney, acquired: Secondary | ICD-10-CM | POA: Diagnosis not present

## 2022-11-21 MED ORDER — GADOBUTROL 1 MMOL/ML IV SOLN
10.0000 mL | Freq: Once | INTRAVENOUS | Status: AC | PRN
  Administered 2022-11-21: 10 mL via INTRAVENOUS

## 2022-12-04 DIAGNOSIS — Z86010 Personal history of colonic polyps: Secondary | ICD-10-CM | POA: Diagnosis not present

## 2022-12-04 DIAGNOSIS — Z1211 Encounter for screening for malignant neoplasm of colon: Secondary | ICD-10-CM | POA: Diagnosis not present

## 2022-12-04 DIAGNOSIS — K5900 Constipation, unspecified: Secondary | ICD-10-CM | POA: Diagnosis not present

## 2022-12-04 DIAGNOSIS — K5730 Diverticulosis of large intestine without perforation or abscess without bleeding: Secondary | ICD-10-CM | POA: Diagnosis not present

## 2022-12-12 ENCOUNTER — Ambulatory Visit: Payer: Federal, State, Local not specified - PPO

## 2022-12-12 ENCOUNTER — Telehealth: Payer: Self-pay

## 2022-12-12 DIAGNOSIS — Z0000 Encounter for general adult medical examination without abnormal findings: Secondary | ICD-10-CM

## 2022-12-12 NOTE — Telephone Encounter (Addendum)
Called patient and relayed message below as per Dr. Mosetta Putt, Patient voiced full understanding.  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

## 2022-12-12 NOTE — Progress Notes (Unsigned)
Appointment Outcome: Completed, Session #: final f/u                         Start time: 4:30pm   End time: 4:50pm   Total Mins: 20 minutes  AGREEMENTS SECTION   Overall Goal(s): Improve healthy eating habits  Increase physical activity   Agreement/Action Steps:  Improve healthy eating habits Eat cake/sweets 2-3 times a week Drink mini (10 -12 oz.) can sodas instead of larger volume of soda Maintain eating healthy snacks  Read food labels Monitor consuming 25 g of sugar or less per day Monitor consuming 1,500-2,000mg  of sodium per day Drink 64-100 oz of water per day Use 1/2-gallon container on the weekends to aid in water consumption   Increase physical activity Exercise for at least 45-60 minutes 3-5 days per week (with monitor) Climb stairs at home Follow exercise video on YouTube Walk around neighborhood with a partner    Progress Notes:  Patient stated that she has been able to manage her sugar consumption better by avoiding going to a particular place to order cake. Patient stated that it has been approximately 6 weeks since going to her favorite place to get cake but shared that she has been eating Oreo cookies. Patient mentioned that when she does eat the cookies, she practices portion control by reading the food label and eating the recommended serving size. Patient stated that otherwise, she does not consume a lot of sugar as she did before. Patient shared that she still eats dried vegetables as snacks. Patient reported that she has focused on cutting down on consuming fast food over the past month. Patient shared that she is being mindful of what she is consuming and controlling what she allows herself to eat.   Patient explained that she is not reading food labels as much as before because she is familiar with the nutritional breakdown of the foods that she routinely eats. Patient stated that she usually has the same meals and know how much sodium she is consuming during  those times. Patient mentioned that she is not eating foods high in sodium and can tell by taste if something is too salty and chooses not to eat it. Patient mentioned that she has been able to maintain consuming a minimum of 64 ounces of water per day. Patient stated that she does not drink the mini cans of soda as frequent as before.   Patient shared that she has not been walking or engaging in additional physical activity outside of walking at work. Patient stated that she needs to stop being lazy and just do it. Patient also stated that her accountability walking partners are not helping her because they decide not to walk. Patient stated that although this is discouraging because she prefers to have someone with her when walking due to her previous health share.   Patient stated that is has been 6 months since the incident and should get outside to walk even if she must walk alone. Patient shared that she has been thinking about getting over the fear of walking alone by just going for a walk one day to see how she does and then increase her walking time. Patient shared that she must hold herself accountable at this point. Patient stated that she used to have a Fitbit where she tracked her steps at work but does not have that device at that time.   Patient mentioned that during the process of health coaching,  she has become more self-aware of her health habits. Patient shared that she remains motivated to improve her health and continues to implement her healthy eating action steps, so she doesn't have a setback to where she used to be. Patient stated because of the changes that she has made; she has seen improvements in her blood pressure. Patient stated that she typically gets off work at 7pm and starts around 8am and will need to figure out when to dedicate time to increasing her physical activity.     Indicators of Success and Accountability:  Patient continues to reduce her sugar consumption by  avoiding eating cake and sweets often.  Readiness: Patient is in the action phase of improving her healthy eating habits and is in the preparation stage of increasing her physical activity.  Strengths and Supports: Patient is being supported by family. Patient's stated that her self-awareness has increased, and she relies on that to continue improving her eating habits.   Challenges and Barriers: Patient stated that being lazy and her work schedule is a challenge to her motivation to increase physical activity.     Coaching Outcomes: Patient has decided that she needs to try to get started to be more physically active and stated that she needs to schedule a dedicated time for additional walking.   Patient has completed their final follow-up session for health coaching.     Attempted: Fulfilled - Patient is minimizing her sugar consumption by avoiding eating cake/sweets frequently. Patient has reduced consumption of mini sodas by increasing her water intake to a minimum of 64 ounces per day. Patient is maintaining eating healthier snacks.  Partial - Patient read food labels occasionally to practice portion control and to monitor sodium and sugar in products she typically does not eat, but not for all foods.   Not met - Patient has not implemented her action steps to increase physical activity outside of work over the past 3 months.

## 2023-02-05 ENCOUNTER — Ambulatory Visit (HOSPITAL_BASED_OUTPATIENT_CLINIC_OR_DEPARTMENT_OTHER): Payer: Federal, State, Local not specified - PPO | Admitting: Cardiovascular Disease

## 2023-02-05 ENCOUNTER — Encounter (HOSPITAL_BASED_OUTPATIENT_CLINIC_OR_DEPARTMENT_OTHER): Payer: Self-pay | Admitting: Cardiovascular Disease

## 2023-02-05 VITALS — BP 130/76 | HR 86 | Ht 66.0 in | Wt 217.4 lb

## 2023-02-05 DIAGNOSIS — I10 Essential (primary) hypertension: Secondary | ICD-10-CM

## 2023-02-05 DIAGNOSIS — E782 Mixed hyperlipidemia: Secondary | ICD-10-CM

## 2023-02-05 NOTE — Progress Notes (Deleted)
Advanced Hypertension Clinic Follow-up:    Date:  02/05/2023   ID:  Briana Price, Briana Price [birth year], MRN [id number]  PCP:  Gweneth Dimitri, MD  Cardiologist:  Chilton Si, MD  Nephrologist:  Referring MD: Gweneth Dimitri, MD   CC: Hypertension  History of Present Illness:    Briana Price is a 57 y.o. female with a hx of anemia, arthritis, breast cancer, GERD, and hypertension, here for follow-up. She was initially seen in the [facility] 05/07/2021. She saw Dr. Cherly Hensen on 01/2021 and her blood pressure was 138/98 on HCTZ and Losartan, so she was referred to advanced hypertension clinic. She has a history of breast cancer and underwent bilateral mastectomy. She saw oncology 04/2021 and her blood pressure was well controlled. Her blood pressure averaged 136-140s systolic at home. She also reported a few episodes of a "pulling," positional chest pain randomly at rest. She had some dyspnea when climbing inclines or stairs. She was referred for the PREP program and advised to limit sodium to 1500-2000 mg daily.   She was fatigued and struggling to stay motivated. She had started the PREP program and was trying to walk for exercise, but was limited by left knee pain. She continued to make progress with dietary changes, including cutting back on sweets and sodas. However, her blood pressure remained poorly controlled at home and in clinic. Losartan was switched to amlodipine 10 mg daily. She was enrolled in the [employer] remote patient monitoring study. She followed up with our pharmacist 11/01/2021 and her BP was 130/88. Her at home readings averaged 127/86 per [employer]. A few days prior to that appointment she had an episode of dull aching chest pain lasting 10 minutes. HCTZ was discontinued, and she was started on chlorthalidone 25 mg daily. She has not been checking her blood pressure regularly, but when she does her readings average 110-130s/70-90s. 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 She followed up with oncology 11/2021 and was doing well.  She was seen by Gillian Shields, NP 02/2022 for syncope. Chest CT was negative for PE. She was reportedly hypotensive with EMS, but not by the time she got to the ED. The episode occurred after not eating, getting up quickly, and walking down the stairs. Blood pressures at home were in the 110s/70s-80s. Spironolactone was discontinued. She wore a 14 day monitor that showed rare PACs and PVCs, and short runs of SVT. She had an Echo 03/2022 that revealed LVEF 60-65% and normal diastolic function.  At her visit 05/2022 she noted LE edema.  She wasn't exercising due to fear of recurrent syncope and gained 15 lb.  She was encouraged to start back exercising and wear compression socks.  She has been working with our care guide on lifestyle modification.  Today, her blood pressure in the office is 122/85, and 130/76 on repeat. She hasn't monitored her blood pressures at home. She reports not being as active as she was previously. Since her syncopal episode last July, she had been hesitant to resume her exercise routines and has gained about 40 lbs. Previously she would walk for up to an hour along a route with inclines. Mostly her activity is limited by leg pain, which causes her to stop periodically. She only experiences shortness of breath with running up the stairs, or sometimes after caring for her 61 year old niece. Additionally she complains of stiffness near the top of her ankles which persists until she moves around. Her symptoms then subside  after a few minutes. She feels that the stiffness is related to being mostly sedentary at work and at home. She continues to work at the [employer] which is very stressful. She denies any palpitations, chest pain, or peripheral edema. No lightheadedness, headaches, orthopnea, or PND.  Previous Antihypertensives: Lisinopril  Past Medical History:  Diagnosis Date   Anemia    Ankle edema 05/26/2022    Arthritis    Breast cancer (HCC)    Cancer (HCC)    left breast cancer   Family history of breast cancer    Genetic testing 05/28/2017   Common Cancers panel (46 genes) @ [facility] - No pathogenic mutations detected   GERD (gastroesophageal reflux disease)    Hair loss 09/12/2021   Headache    Migraines   Hypertension    Non-healing surgical wound    right chest wall    Past Surgical History:  Procedure Laterality Date   BREAST SURGERY     biopsy   CESAREAN SECTION     CHOLECYSTECTOMY N/A 02/07/2013   Procedure: LAPAROSCOPIC CHOLECYSTECTOMY WITH INTRAOPERATIVE CHOLANGIOGRAM;  Surgeon: Cherylynn Ridges, MD;  Location: [facility];  Service: General;  Laterality: N/A;   COLONOSCOPY W/ BIOPSIES AND POLYPECTOMY     COLONOSCOPY W/ BIOPSIES AND POLYPECTOMY     EXCISION OF BREAST LESION Right 06/08/2019   Procedure: EXCISION OF NON HEALING WOUND RIGHT CHEST;  Surgeon: Almond Lint, MD;  Location: [facility];  Service: General;  Laterality: Right;   FOOT SURGERY  2003   lt foot bunionectomy   MASTECTOMY W/ SENTINEL NODE BIOPSY Bilateral 11/19/2017   Procedure: BILATERAL MASTECTOMIES WITH LEFT SENTINEL LYMPH NODE BIOPSY;  Surgeon: Almond Lint, MD;  Location: [facility];  Service: General;  Laterality: Bilateral;   MULTIPLE TOOTH EXTRACTIONS     PORTACATH PLACEMENT Right 05/19/2017   Procedure: INSERTION PORT-A-CATH;  Surgeon: Almond Lint, MD;  Location: [facility] OR;  Service: General;  Laterality: Right;    Current Medications: Current Meds  Medication Sig   acetaminophen (TYLENOL) 500 MG tablet Take 1,000 mg by mouth every 8 (eight) hours as needed for moderate pain or headache.   amLODipine (NORVASC) 5 MG tablet Take 1 tablet (5 mg total) by mouth daily.   Ascorbic Acid (VITAMIN C) 1000 MG tablet Take 1,000 mg by mouth daily.   atorvastatin (LIPITOR) 20 MG tablet Take 1 tablet by mouth daily.   methocarbamol (ROBAXIN) 500 MG tablet Take 1 tablet (500 mg total) by  mouth once as needed for up to 1 dose. Take 30-60 minutes prior to MRI for muscle relaxation. May repeat x1 if needed   Multiple Vitamins-Minerals (HAIR/SKIN/NAILS) CAPS Take 3 capsules by mouth daily.   Prenatal Vit-Fe Fumarate-FA (PRENATAL VITAMIN PO) Take by mouth daily.   spironolactone (ALDACTONE) 25 MG tablet Take 0.5 tablets (12.5 mg total) by mouth daily.     Allergies:   Patient has no known allergies.   Social History   Socioeconomic History   Marital status: Married    Spouse name: Not on file   Number of children: Not on file   Years of education: Not on file   Highest education level: Not on file  Occupational History   Not on file  Tobacco Use   Smoking status: Former    Types: Cigarettes   Smokeless tobacco: Never   Tobacco comments:    15-20 years ago  Vaping Use   Vaping Use: Never used  Substance and Sexual Activity  Alcohol use: No   Drug use: No   Sexual activity: Not on file  Other Topics Concern   Not on file  Social History Narrative   Tobacco use   Cigarettes: Never smoked    Tobacco history last updated 01/23/2014   No smoking   No tobacco exposure   No alcohol   Caffeine : yes soda   No recreational drug use   Occupation :employed ,Rushie Nyhan 01/25/2013   Martial status : single    Children: Rushie Nyhan 01/25/2013   40 year old son   Social Determinants of Health   Financial Resource Strain: Not on file  Food Insecurity: Not on file  Transportation Needs: Not on file  Physical Activity: Not on file  Stress: Not on file  Social Connections: Not on file     Family History: The patient's family history includes Breast cancer in her maternal grandmother and other family members; Breast cancer (age of onset: 18) in her paternal aunt; Breast cancer (age of onset: 60) in her maternal aunt; Heart attack in her maternal grandfather; Hypertension in her mother; Leukemia in her paternal uncle.  ROS:   Please see the history of present  illness.    (+) BLE pain (+) Occasional exertional dyspnea (+) Stiffness of top of bilateral ankles (+) Stress All other systems reviewed and are negative.  EKGs/Labs/Other Studies Reviewed:    Monitor  04/2022: 14 Day Zio Monitor   Quality: Fair.  Baseline artifact. Predominant rhythm: sinus rhythm Average heart rate: 86 bpm Max heart rate: 149 bpm Min heart rate: 62 bpm Pauses >2.5 seconds: none   Rare PACs or PVCs Short runs of SVT up to 6 beats  Echo  03/19/2022:  1. Left ventricular ejection fraction, by estimation, is 60 to 65%. Left  ventricular ejection fraction by 3D volume is 62 %. The left ventricle has  normal function. The left ventricle has no regional wall motion  abnormalities. Left ventricular diastolic   parameters were normal.   2. Right ventricular systolic function is normal. The right ventricular  size is normal. There is normal pulmonary artery systolic pressure.   3. The mitral valve is normal in structure. Trivial mitral valve  regurgitation. No evidence of mitral stenosis.   4. The aortic valve is tricuspid. Aortic valve regurgitation is not  visualized.   5. The inferior vena cava is normal in size with greater than 50%  respiratory variability, suggesting right atrial pressure of 3 mmHg.   Comparison(s): Compared to prior TTE in 2019, there is no significant  change.   CTA Chest  02/28/2022: FINDINGS: Cardiovascular: Satisfactory opacification of the pulmonary arteries to the segmental level. No evidence of pulmonary embolism. Normal heart size. No pericardial effusion.   Mediastinum/Nodes: No enlarged mediastinal, hilar, or axillary lymph nodes. Thyroid gland, trachea, and esophagus demonstrate no significant findings.   Lungs/Pleura: Lungs are clear. No pleural effusion or pneumothorax.   Upper Abdomen: No acute abnormality.   Musculoskeletal: No chest wall abnormality. No acute or significant osseous findings.   Review of the MIP  images confirms the above findings.   IMPRESSION: No pulmonary emboli. No acute abnormality. No cause is for syncope identified.  TTE 02/15/2018: - Left ventricle: The cavity size was normal. Wall thickness was    increased in a pattern of mild LVH. Systolic function was normal.    The estimated ejection fraction was in the range of 60% to 65%.    Wall motion was normal; there  were no regional wall motion    abnormalities. Doppler parameters are consistent with abnormal    left ventricular relaxation (grade 1 diastolic dysfunction).  - Impressions: GLS -19.9%. LS&' 9.8 cm/s.   CT Chest 12/28/2017: IMPRESSION: No evidence of recurrent or metastatic carcinoma. No other acute findings.   3.5 cm right uterine fibroid.   Colonic diverticulosis, without radiographic evidence of diverticulitis.  EKG:  EKG is personally reviewed. 02/05/2023:  Sinus rhythm. Rate 86 bpm. 05/26/2022:  EKG was not ordered. 12/31/2021: EKG was not ordered. 09/12/2021: EKG was not ordered. 05/07/2021: Sinus rhythm. Rate 74 bpm. LVH.  Recent Labs: 03/05/2022: TSH 1.020 08/21/2022: ALT 31; BUN 10; Creatinine 0.60; Hemoglobin 12.9; Platelet Count 317; Potassium 3.6; Sodium 140   Recent Lipid Panel No results found for: "CHOL", "TRIG", "HDL", "CHOLHDL", "VLDL", "LDLCALC", "LDLDIRECT"  Physical Exam:    VS:  BP 130/76 (BP Location: Right Arm, Patient Position: Sitting, Cuff Size: Large)   Pulse 86   Ht 5\' 6"  (1.676 m)   Wt 217 lb 6.4 oz (98.6 kg)   BMI 35.09 kg/m  , BMI Body mass index is 35.09 kg/m. GENERAL:  Well appearing HEENT: Pupils equal round and reactive, fundi not visualized, oral mucosa unremarkable NECK:  No jugular venous distention, waveform within normal limits, carotid upstroke brisk and symmetric, no bruits LUNGS:  Clear to auscultation bilaterally HEART:  RRR.  PMI not displaced or sustained,S1 and S2 within normal limits, no S3, no S4, no clicks, no rubs, no murmurs ABD:  Flat, positive  bowel sounds normal in frequency in pitch, no bruits, no rebound, no guarding, no midline pulsatile mass, no hepatomegaly, no splenomegaly EXT:  2 plus pulses throughout, \trace ankle edema, no cyanosis no clubbing SKIN:  No rashes no nodules NEURO:  Cranial nerves II through XII grossly intact, motor grossly intact throughout PSYCH:  Cognitively intact, oriented to person place and time   ASSESSMENT/PLAN:    Assessment and Plan    Hypertension:  Well controlled on current regimen of Amlodipine and Spironolactone. No symptoms of heart failure or chest pain. -Continue current medications. -Check blood pressure at home regularly. -Increase exercise to at least 150 minutes weekly.  Hyperlipidemia:  Well-controlled on Atorvastatin. -Continue Atorvastatin.  Musculoskeletal discomfort: Reports stiffness in the anterior aspect of ankles, resolving within 10 minutes of movement. Likely related to sedentary lifestyle. -Increase physical activity, starting with 30 minutes of walking daily.  Preoperative clearance:  Planning for DIEP flap surgery. No symptoms of heart failure or chest pain. Able to walk up a flight of stairs and four blocks without difficulty, which is the equivalent of 4 METS. -Cleared for surgery without ischemic evaluation. -Continue current medications.  Follow-up in 1 year or as needed.        Screening for Secondary Hypertension:     05/07/2021    3:38 PM  Causes  Drugs/Herbals Screened     - Comments caffeine, sodium  Renovascular HTN N/A  Sleep Apnea Screened     - Comments snores.  no apea or daytime somnolence  Thyroid Disease Screened  Hyperaldosteronism N/A  Pheochromocytoma N/A  Cushing's Syndrome N/A  Hyperparathyroidism N/A  Coarctation of the Aorta Screened     - Comments unable to check BP bilaterally 2/2 s/p mastectomy and LN removal  Compliance Screened    Relevant Labs/Studies:    Latest Ref Rng & Units 08/21/2022    1:58 PM 02/28/2022    12:44 PM 12/31/2021    2:22 PM  Basic Labs  Sodium 135 - 145 mmol/L 140  141  140   Potassium 3.5 - 5.1 mmol/L 3.6  4.3  5.1   Creatinine 0.44 - 1.00 mg/dL 7.82  9.56  2.13        Latest Ref Rng & Units 03/05/2022   11:01 AM 09/12/2021    2:48 PM  Thyroid   TSH 0.450 - 4.500 uIU/mL 1.020  0.928     Disposition:    FU with Zygmund Passero C. Duke Salvia, MD, [facility] in 1 year.  Medication Adjustments/Labs and Tests Ordered: Current medicines are reviewed at length with the patient today.  Concerns regarding medicines are outlined above.   Orders Placed This Encounter  Procedures   EKG 12-Lead   No orders of the defined types were placed in this encounter.  I,Mathew Stumpf,acting as a Neurosurgeon for Chilton Si, MD.,have documented all relevant documentation on the behalf of Chilton Si, MD,as directed by  Chilton Si, MD while in the presence of Chilton Si, MD.  I, [place] C. Duke Salvia, MD have reviewed all documentation for this visit.  The documentation of the exam, diagnosis, procedures, and orders on 02/05/2023 are all accurate and complete.  Signed, Chilton Si, MD  02/05/2023 3:22 PM    [facility]

## 2023-02-05 NOTE — Progress Notes (Signed)
Advanced Hypertension Clinic Follow-up:    Date:  02/05/2023   ID:  Briana Price, Briana Price 21-Jun-1966, MRN [id number]  PCP:  Briana Dimitri, MD  Cardiologist:  Briana Si, MD  Nephrologist:  Referring MD: Briana Dimitri, MD   CC: Hypertension  History of Present Illness:    Briana Price is a 57 y.o. female with a hx of anemia, arthritis, breast cancer, GERD, and hypertension, here for follow-up. She was initially seen in the [facility]pertension Clinic 05/07/2021. She saw Dr. Cherly Hensen on 01/2021 and her blood pressure was 138/98 on HCTZ and Losartan, so she was referred to advanced hypertension clinic. She has a history of breast cancer and underwent bilateral mastectomy. She saw oncology 04/2021 and her blood pressure was well controlled. Her blood pressure averaged 136-140s systolic at home. She also reported a few episodes of a "pulling," positional chest pain randomly at rest. She had some dyspnea when climbing inclines or stairs. She was referred for the PREP program and advised to limit sodium to 1500-2000 mg daily.   She was fatigued and struggling to stay motivated. She had started the PREP program and was trying to walk for exercise, but was limited by left knee pain. She continued to make progress with dietary changes, including cutting back on sweets and sodas. However, her blood pressure remained poorly controlled at home and in clinic. Losartan was switched to amlodipine 10 mg daily. She was enrolled in the [employer] remote patient monitoring study. She followed up with our pharmacist 11/01/2021 and her BP was 130/88. Her at home readings averaged 127/86 per [employer]. A few days prior to that appointment she had an episode of dull aching chest pain lasting 10 minutes. HCTZ was discontinued, and she was started on chlorthalidone 25 mg daily. She has not been checking her blood pressure regularly, but when she does her readings average 110-130s/70-90s. 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 She followed up with oncology 11/2021 and was doing well.  She was seen by Gillian Shields, NP 02/2022 for syncope. Chest CT was negative for PE. She was reportedly hypotensive with EMS, but not by the time she got to the ED. The episode occurred after not eating, getting up quickly, and walking down the stairs. Blood pressures at home were in the 110s/70s-80s. Spironolactone was discontinued. She wore a 14 day monitor that showed rare PACs and PVCs, and short runs of SVT. She had an Echo 03/2022 that revealed LVEF 60-65% and normal diastolic function.  At her visit 05/2022 she noted LE edema.  She wasn't exercising due to fear of recurrent syncope and gained 15 lb.  She was encouraged to start back exercising and wear compression socks.  She has been working with our care guide on lifestyle modification.  Previous Antihypertensives: Lisinopril  Past Medical History:  Diagnosis Date   Anemia    Ankle edema 05/26/2022   Arthritis    Breast cancer (HCC)    Cancer (HCC)    left breast cancer   Family history of breast cancer    Genetic testing 05/28/2017   Common Cancers panel (46 genes) @ [facility] - No pathogenic mutations detected   GERD (gastroesophageal reflux disease)    Hair loss 09/12/2021   Headache    Migraines   Hypertension    Non-healing surgical wound    right chest wall    Past Surgical History:  Procedure Laterality Date   BREAST SURGERY     biopsy   CESAREAN  SECTION     CHOLECYSTECTOMY N/A 02/07/2013   Procedure: LAPAROSCOPIC CHOLECYSTECTOMY WITH INTRAOPERATIVE CHOLANGIOGRAM;  Surgeon: Cherylynn Ridges, MD;  Location: [facility];  Service: General;  Laterality: N/A;   COLONOSCOPY W/ BIOPSIES AND POLYPECTOMY     COLONOSCOPY W/ BIOPSIES AND POLYPECTOMY     EXCISION OF BREAST LESION Right 06/08/2019   Procedure: EXCISION OF NON HEALING WOUND RIGHT CHEST;  Surgeon: Almond Lint, MD;  Location: [facility] OR;  Service: General;  Laterality: Right;   FOOT SURGERY   2003   lt foot bunionectomy   MASTECTOMY W/ SENTINEL NODE BIOPSY Bilateral 11/19/2017   Procedure: BILATERAL MASTECTOMIES WITH LEFT SENTINEL LYMPH NODE BIOPSY;  Surgeon: Almond Lint, MD;  Location: [facility];  Service: General;  Laterality: Bilateral;   MULTIPLE TOOTH EXTRACTIONS     PORTACATH PLACEMENT Right 05/19/2017   Procedure: INSERTION PORT-A-CATH;  Surgeon: Almond Lint, MD;  Location: [facility] OR;  Service: General;  Laterality: Right;    Current Medications: Current Meds  Medication Sig   acetaminophen (TYLENOL) 500 MG tablet Take 1,000 mg by mouth every 8 (eight) hours as needed for moderate pain or headache.   amLODipine (NORVASC) 5 MG tablet Take 1 tablet (5 mg total) by mouth daily.   Ascorbic Acid (VITAMIN C) 1000 MG tablet Take 1,000 mg by mouth daily.   atorvastatin (LIPITOR) 20 MG tablet Take 1 tablet by mouth daily.   methocarbamol (ROBAXIN) 500 MG tablet Take 1 tablet (500 mg total) by mouth once as needed for up to 1 dose. Take 30-60 minutes prior to MRI for muscle relaxation. May repeat x1 if needed   Multiple Vitamins-Minerals (HAIR/SKIN/NAILS) CAPS Take 3 capsules by mouth daily.   Prenatal Vit-Fe Fumarate-FA (PRENATAL VITAMIN PO) Take by mouth daily.   spironolactone (ALDACTONE) 25 MG tablet Take 0.5 tablets (12.5 mg total) by mouth daily.     Allergies:   Patient has no known allergies.   Social History   Socioeconomic History   Marital status: Married    Spouse name: Not on file   Number of children: Not on file   Years of education: Not on file   Highest education level: Not on file  Occupational History   Not on file  Tobacco Use   Smoking status: Former    Types: Cigarettes   Smokeless tobacco: Never   Tobacco comments:    15-20 years ago  Vaping Use   Vaping Use: Never used  Substance and Sexual Activity   Alcohol use: No   Drug use: No   Sexual activity: Not on file  Other Topics Concern   Not on file  Social History  Narrative   Tobacco use   Cigarettes: Never smoked    Tobacco history last updated 01/23/2014   No smoking   No tobacco exposure   No alcohol   Caffeine : yes soda   No recreational drug use   Occupation :employed ,Rushie Nyhan 01/25/2013   Martial status : single    Children: Rushie Nyhan 01/25/2013   70 year old son   Social Determinants of Health   Financial Resource Strain: Not on file  Food Insecurity: Not on file  Transportation Needs: Not on file  Physical Activity: Not on file  Stress: Not on file  Social Connections: Not on file     Family History: The patient's family history includes Breast cancer in her maternal grandmother and other family members; Breast cancer (age of onset: 47) in her  paternal aunt; Breast cancer (age of onset: 31) in her maternal aunt; Heart attack in her maternal grandfather; Hypertension in her mother; Leukemia in her paternal uncle.  ROS:   Please see the history of present illness.    (+) Bilateral ankle edema R>L (+) Chest discomfort with inspiration All other systems reviewed and are negative.  EKGs/Labs/Other Studies Reviewed:    Monitor  04/2022: 14 Day Zio Monitor   Quality: Fair.  Baseline artifact. Predominant rhythm: sinus rhythm Average heart rate: 86 bpm Max heart rate: 149 bpm Min heart rate: 62 bpm Pauses >2.5 seconds: none   Rare PACs or PVCs Short runs of SVT up to 6 beats  Echo  03/19/2022:  1. Left ventricular ejection fraction, by estimation, is 60 to 65%. Left  ventricular ejection fraction by 3D volume is 62 %. The left ventricle has  normal function. The left ventricle has no regional wall motion  abnormalities. Left ventricular diastolic   parameters were normal.   2. Right ventricular systolic function is normal. The right ventricular  size is normal. There is normal pulmonary artery systolic pressure.   3. The mitral valve is normal in structure. Trivial mitral valve  regurgitation. No evidence of mitral  stenosis.   4. The aortic valve is tricuspid. Aortic valve regurgitation is not  visualized.   5. The inferior vena cava is normal in size with greater than 50%  respiratory variability, suggesting right atrial pressure of 3 mmHg.   Comparison(s): Compared to prior TTE in 2019, there is no significant  change.   CTA Chest  02/28/2022: FINDINGS: Cardiovascular: Satisfactory opacification of the pulmonary arteries to the segmental level. No evidence of pulmonary embolism. Normal heart size. No pericardial effusion.   Mediastinum/Nodes: No enlarged mediastinal, hilar, or axillary lymph nodes. Thyroid gland, trachea, and esophagus demonstrate no significant findings.   Lungs/Pleura: Lungs are clear. No pleural effusion or pneumothorax.   Upper Abdomen: No acute abnormality.   Musculoskeletal: No chest wall abnormality. No acute or significant osseous findings.   Review of the MIP images confirms the above findings.   IMPRESSION: No pulmonary emboli. No acute abnormality. No cause is for syncope identified.  TTE 02/15/2018: - Left ventricle: The cavity size was normal. Wall thickness was    increased in a pattern of mild LVH. Systolic function was normal.    The estimated ejection fraction was in the range of 60% to 65%.    Wall motion was normal; there were no regional wall motion    abnormalities. Doppler parameters are consistent with abnormal    left ventricular relaxation (grade 1 diastolic dysfunction).  - Impressions: GLS -19.9%. LS&' 9.8 cm/s.   CT Chest 12/28/2017: IMPRESSION: No evidence of recurrent or metastatic carcinoma. No other acute findings.   3.5 cm right uterine fibroid.   Colonic diverticulosis, without radiographic evidence of diverticulitis.  EKG:  EKG is personally reviewed. 05/26/2022:  EKG was not ordered. 12/31/2021: EKG was not ordered. 09/12/2021: EKG was not ordered. 05/07/2021: Sinus rhythm. Rate 74 bpm. LVH.  Recent Labs: 03/05/2022: TSH  1.020 08/21/2022: ALT 31; BUN 10; Creatinine 0.60; Hemoglobin 12.9; Platelet Count 317; Potassium 3.6; Sodium 140   Recent Lipid Panel No results found for: "CHOL", "TRIG", "HDL", "CHOLHDL", "VLDL", "LDLCALC", "LDLDIRECT"  Physical Exam:    VS:  BP 130/76 (BP Location: Right Arm, Patient Position: Sitting, Cuff Size: Large)   Pulse 86   Ht 5\' 6"  (1.676 m)   Wt 217 lb 6.4 oz (98.6 kg)  BMI 35.09 kg/m  , BMI Body mass index is 35.09 kg/m. GENERAL:  Well appearing HEENT: Pupils equal round and reactive, fundi not visualized, oral mucosa unremarkable NECK:  No jugular venous distention, waveform within normal limits, carotid upstroke brisk and symmetric, no bruits LUNGS:  Clear to auscultation bilaterally HEART:  RRR.  PMI not displaced or sustained,S1 and S2 within normal limits, no S3, no S4, no clicks, no rubs, no murmurs ABD:  Flat, positive bowel sounds normal in frequency in pitch, no bruits, no rebound, no guarding, no midline pulsatile mass, no hepatomegaly, no splenomegaly EXT:  2 plus pulses throughout, L>R ankle edema, no cyanosis no clubbing SKIN:  No rashes no nodules NEURO:  Cranial nerves II through XII grossly intact, motor grossly intact throughout PSYCH:  Cognitively intact, oriented to person place and time   ASSESSMENT/PLAN:    No problem-specific Assessment & Plan notes found for this encounter.   Screening for Secondary Hypertension:     05/07/2021    3:38 PM  Causes  Drugs/Herbals Screened     - Comments caffeine, sodium  Renovascular HTN N/A  Sleep Apnea Screened     - Comments snores.  no apea or daytime somnolence  Thyroid Disease Screened  Hyperaldosteronism N/A  Pheochromocytoma N/A  Cushing's Syndrome N/A  Hyperparathyroidism N/A  Coarctation of the Aorta Screened     - Comments unable to check BP bilaterally 2/2 s/p mastectomy and LN removal  Compliance Screened    Relevant Labs/Studies:    Latest Ref Rng & Units 08/21/2022    1:58 PM  02/28/2022   12:44 PM 12/31/2021    2:22 PM  Basic Labs  Sodium 135 - 145 mmol/L 140  141  140   Potassium 3.5 - 5.1 mmol/L 3.6  4.3  5.1   Creatinine 0.44 - 1.00 mg/dL 8.29  5.62  1.30        Latest Ref Rng & Units 03/05/2022   11:01 AM 09/12/2021    2:48 PM  Thyroid   TSH 0.450 - 4.500 uIU/mL 1.020  0.928     Disposition:    FU with Margurite Duffy C. Duke Salvia, MD, [facility], [place] in 4 months.   Medication Adjustments/Labs and Tests Ordered: Current medicines are reviewed at length with the patient today.  Concerns regarding medicines are outlined above.   Orders Placed This Encounter  Procedures   EKG 12-Lead   No orders of the defined types were placed in this encounter.  I,Mathew Stumpf,acting as a Neurosurgeon for Briana Si, MD.,have documented all relevant documentation on the behalf of Briana Si, MD,as directed by  Briana Si, MD while in the presence of Briana Si, MD.  I, Krystie Leiter C. Duke Salvia, MD have reviewed all documentation for this visit.  The documentation of the exam, diagnosis, procedures, and orders on 02/05/2023 are all accurate and complete.  Signed, Briana Si, MD  02/05/2023 3:22 PM    [facility]

## 2023-02-05 NOTE — Patient Instructions (Signed)
Medication Instructions:  Your physician recommends that you continue on your current medications as directed. Please refer to the Current Medication list given to you today.    Follow-Up: 1 year in ADV HTN CLINIC

## 2023-03-02 DIAGNOSIS — Z01419 Encounter for gynecological examination (general) (routine) without abnormal findings: Secondary | ICD-10-CM | POA: Diagnosis not present

## 2023-03-20 DIAGNOSIS — Z853 Personal history of malignant neoplasm of breast: Secondary | ICD-10-CM | POA: Diagnosis not present

## 2023-03-20 DIAGNOSIS — E349 Endocrine disorder, unspecified: Secondary | ICD-10-CM | POA: Diagnosis not present

## 2023-04-03 DIAGNOSIS — Z1211 Encounter for screening for malignant neoplasm of colon: Secondary | ICD-10-CM | POA: Diagnosis not present

## 2023-04-03 DIAGNOSIS — K5730 Diverticulosis of large intestine without perforation or abscess without bleeding: Secondary | ICD-10-CM | POA: Diagnosis not present

## 2023-05-07 ENCOUNTER — Other Ambulatory Visit (HOSPITAL_BASED_OUTPATIENT_CLINIC_OR_DEPARTMENT_OTHER): Payer: Self-pay | Admitting: Family

## 2023-05-07 NOTE — Telephone Encounter (Signed)
Rx request sent to pharmacy.  

## 2023-06-25 DIAGNOSIS — Z9011 Acquired absence of right breast and nipple: Secondary | ICD-10-CM | POA: Diagnosis not present

## 2023-06-25 DIAGNOSIS — C50512 Malignant neoplasm of lower-outer quadrant of left female breast: Secondary | ICD-10-CM | POA: Diagnosis not present

## 2023-06-29 DIAGNOSIS — E782 Mixed hyperlipidemia: Secondary | ICD-10-CM | POA: Diagnosis not present

## 2023-06-29 DIAGNOSIS — I10 Essential (primary) hypertension: Secondary | ICD-10-CM | POA: Diagnosis not present

## 2023-06-29 DIAGNOSIS — Z1159 Encounter for screening for other viral diseases: Secondary | ICD-10-CM | POA: Diagnosis not present

## 2023-06-29 DIAGNOSIS — R7301 Impaired fasting glucose: Secondary | ICD-10-CM | POA: Diagnosis not present

## 2023-07-13 ENCOUNTER — Telehealth: Payer: Self-pay | Admitting: Hematology

## 2023-07-13 NOTE — Telephone Encounter (Signed)
Patient called in to scheduled appointment with Dr. Mosetta Putt, patient is aware of scheduled appointment times/dates

## 2023-07-23 ENCOUNTER — Encounter: Payer: Self-pay | Admitting: Hematology

## 2023-07-26 NOTE — Assessment & Plan Note (Signed)
invasive ductal carcinoma, c2T0M0, Stage IIB, triple negative, Grade 3. ypT0N0 -She was diagnosed in 04/2017. She is s/p neoadjuvant AC-T and b/l mastectomy (no reconstruction yet due to Covid pandemic) -continue cancer surveillance

## 2023-07-27 ENCOUNTER — Inpatient Hospital Stay: Payer: Federal, State, Local not specified - PPO | Attending: Hematology

## 2023-07-27 ENCOUNTER — Encounter: Payer: Self-pay | Admitting: Hematology

## 2023-07-27 ENCOUNTER — Other Ambulatory Visit: Payer: Self-pay

## 2023-07-27 ENCOUNTER — Inpatient Hospital Stay: Payer: Federal, State, Local not specified - PPO | Admitting: Hematology

## 2023-07-27 VITALS — BP 128/86 | HR 76 | Temp 97.7°F | Resp 17 | Wt 216.5 lb

## 2023-07-27 DIAGNOSIS — Z9221 Personal history of antineoplastic chemotherapy: Secondary | ICD-10-CM | POA: Insufficient documentation

## 2023-07-27 DIAGNOSIS — G8929 Other chronic pain: Secondary | ICD-10-CM | POA: Diagnosis not present

## 2023-07-27 DIAGNOSIS — M1990 Unspecified osteoarthritis, unspecified site: Secondary | ICD-10-CM | POA: Insufficient documentation

## 2023-07-27 DIAGNOSIS — Z9013 Acquired absence of bilateral breasts and nipples: Secondary | ICD-10-CM | POA: Insufficient documentation

## 2023-07-27 DIAGNOSIS — M549 Dorsalgia, unspecified: Secondary | ICD-10-CM | POA: Insufficient documentation

## 2023-07-27 DIAGNOSIS — R7303 Prediabetes: Secondary | ICD-10-CM | POA: Diagnosis not present

## 2023-07-27 DIAGNOSIS — R070 Pain in throat: Secondary | ICD-10-CM | POA: Insufficient documentation

## 2023-07-27 DIAGNOSIS — Z853 Personal history of malignant neoplasm of breast: Secondary | ICD-10-CM | POA: Insufficient documentation

## 2023-07-27 DIAGNOSIS — Z171 Estrogen receptor negative status [ER-]: Secondary | ICD-10-CM

## 2023-07-27 DIAGNOSIS — Z803 Family history of malignant neoplasm of breast: Secondary | ICD-10-CM | POA: Insufficient documentation

## 2023-07-27 DIAGNOSIS — C50512 Malignant neoplasm of lower-outer quadrant of left female breast: Secondary | ICD-10-CM | POA: Diagnosis not present

## 2023-07-27 LAB — CBC WITH DIFFERENTIAL (CANCER CENTER ONLY)
Abs Immature Granulocytes: 0.02 10*3/uL (ref 0.00–0.07)
Basophils Absolute: 0.1 10*3/uL (ref 0.0–0.1)
Basophils Relative: 1 %
Eosinophils Absolute: 0.1 10*3/uL (ref 0.0–0.5)
Eosinophils Relative: 1 %
HCT: 37.9 % (ref 36.0–46.0)
Hemoglobin: 13.1 g/dL (ref 12.0–15.0)
Immature Granulocytes: 0 %
Lymphocytes Relative: 28 %
Lymphs Abs: 1.8 10*3/uL (ref 0.7–4.0)
MCH: 29.6 pg (ref 26.0–34.0)
MCHC: 34.6 g/dL (ref 30.0–36.0)
MCV: 85.6 fL (ref 80.0–100.0)
Monocytes Absolute: 0.5 10*3/uL (ref 0.1–1.0)
Monocytes Relative: 8 %
Neutro Abs: 3.8 10*3/uL (ref 1.7–7.7)
Neutrophils Relative %: 62 %
Platelet Count: 349 10*3/uL (ref 150–400)
RBC: 4.43 MIL/uL (ref 3.87–5.11)
RDW: 12.2 % (ref 11.5–15.5)
WBC Count: 6.3 10*3/uL (ref 4.0–10.5)
nRBC: 0 % (ref 0.0–0.2)

## 2023-07-27 LAB — CMP (CANCER CENTER ONLY)
ALT: 23 U/L (ref 0–44)
AST: 23 U/L (ref 15–41)
Albumin: 4.2 g/dL (ref 3.5–5.0)
Alkaline Phosphatase: 77 U/L (ref 38–126)
Anion gap: 5 (ref 5–15)
BUN: 7 mg/dL (ref 6–20)
CO2: 31 mmol/L (ref 22–32)
Calcium: 9.4 mg/dL (ref 8.9–10.3)
Chloride: 104 mmol/L (ref 98–111)
Creatinine: 0.68 mg/dL (ref 0.44–1.00)
GFR, Estimated: >60 mL/min (ref >60)
Glucose, Bld: 111 mg/dL — ABNORMAL HIGH (ref 70–99)
Potassium: 4 mmol/L (ref 3.5–5.1)
Sodium: 140 mmol/L (ref 135–145)
Total Bilirubin: 0.6 mg/dL (ref <1.2)
Total Protein: 7.4 g/dL (ref 6.5–8.1)

## 2023-07-27 NOTE — Progress Notes (Signed)
[facility]   [phone] Fax:([phone]) [phone]   Clinic Follow up Note   Patient Care Team: Gweneth Dimitri, MD as PCP - General (Family Medicine) Chilton Si, MD as PCP - Cardiology (Cardiology) Malachy Mood, MD as Consulting Physician (Hematology) Almond Lint, MD as Consulting Physician (General Surgery) Dorothy Puffer, MD as Consulting Physician (Radiation Oncology) Loa Socks, NP as Nurse Practitioner (Hematology and Oncology)  Date of Service:  07/27/2023  CHIEF COMPLAINT: throat pain   CURRENT THERAPY:  Cancer surveillance  Oncology History   Malignant neoplasm of lower-outer quadrant of left breast of female, estrogen receptor negative (HCC) invasive ductal carcinoma, c2T0M0, Stage IIB, triple negative, Grade 3. ypT0N0 -She was diagnosed in 04/2017. She is s/p neoadjuvant AC-T and b/l mastectomy (no reconstruction yet due to Covid pandemic) -continue cancer surveillance    Assessment and Plan    Breast Cancer (Triple Negative) Follow-up for triple negative breast cancer diagnosed in 2018. No signs of recurrence. Throat discomfort unlikely related to cancer. Recurrence is very unusual at this stage. - Continue follow-up as needed  Throat Discomfort Intermittent throat burning and sensation of a lump for over a month. No postnasal drip, sinus issues, or hoarseness. Physical exam shows mild redness. Differential diagnosis includes GERD, allergies, or other benign causes. Discussed trying allergy medication and following up with primary care physician for further evaluation. - Try allergy medication - Follow up with primary care physician for further evaluation  Back Pain (Arthritis) Chronic back pain attributed to arthritis. No new symptoms or significant changes. Discussed continuing current management. - Continue current management  Prediabetes Prediabetic and attempting lifestyle modifications including diet changes and  increased physical activity. Recent weight loss noted. Discussed the importance of continued lifestyle changes to manage prediabetes. 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 No discomfort or symptoms. Explained that these are benign and do not require further monitoring unless symptoms develop. - No further action required  Plan -I reassured her that I do not have clinical concern for cancer recurrence, she will follow-up with her primary care physician if she has persistent throat discomfort -Follow-up as needed     SUMMARY OF ONCOLOGIC HISTORY: Oncology History Overview Note  Cancer Staging Malignant neoplasm of lower-outer quadrant of left breast of female, estrogen receptor negative (HCC) Staging form: Breast, AJCC 8th Edition - Clinical: Stage IIB (cT2, cN0(f), cM0, G3, ER: Negative, PR: Negative, HER2: Negative) - Unsigned     Malignant neoplasm of lower-outer quadrant of left breast of female, estrogen receptor negative (HCC)  04/27/2017 Mammogram   IMPRESSION: 1. Highly suspicious mass within the LEFT breast at the 5:30 o'clock axis, 4 cm from the nipple, measuring 2.5 cm, corresponding to the mammographic finding and corresponding to 1 of the palpable lumps in the left breast. Ultrasound-guided biopsy is recommended. 2. Mildly prominent lymph node in the LEFT axilla, with normal cortical thickness but with questionable effacement of the fatty hilum. Ultrasound-guided core biopsy is recommended. 3. No evidence of malignancy within the RIGHT breast. Also, no evidence of malignancy within the second area of clinical concern in the upper outer left breast.   04/29/2017 Initial Diagnosis   Malignant neoplasm of lower-outer quadrant of left breast of female, estrogen receptor negative (HCC)   04/29/2017  Receptors her2   Estrogen Receptor: 0%, NEGATIVE Progesterone Receptor: 0%, NEGATIVE Proliferation Marker Ki67:  90% HER2 NEGATIVE    04/29/2017 Initial Biopsy    Diagnosis 1. 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 - SEE COMMENT. 2. Lymph node, needle/core biopsy, left axilla - THERE IN NO EVIDENCE OF CARCINOMA IN 1 OF 1 LYMPH NODE (0/1).   05/14/2017 Imaging   MRI of Breast Bilateral 05/14/17  IMPRESSION: 1. 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 2. Non mass enhancement extending approximately 3.2 cm anterior to the mass in the lower inner quadrant of the left breast, suspicious for DCIS. There is no correlate on recent mammography. The overall extent of the mass and non mass enhancement is approximately 5 cm. 3. No MRI evidence of malignancy involving the right breast. 4. No pathologic lymphadenopathy. Upper normal sized left axillary lymph nodes, the largest of which was previously biopsied no evidence of metastatic disease.   05/15/2017 Imaging   Bone scan whole body 05/15/17 IMPRESSION: No findings specific for metastatic disease to bone.   05/15/2017 Imaging   CT CAP W contrast 05/15/17  IMPRESSION: 1. 18 mm soft tissue lesion inferior left breast compatible with known primary malignancy. 2. Mildly enlarged left axillary lymph nodes in this patient with biopsy-proven metastatic disease to the left axilla. 3. No evidence for lymphadenopathy elsewhere in the chest. No lymphadenopathy in the abdomen or pelvis. No evidence for metastatic disease in the abdomen or pelvis. 4. Tiny right perifissural lung nodule most likely subpleural lymph node. Attention on follow-up recommended. 5. 7 mm hypoattenuating lesion in the spleen, likely a cyst or pseudocyst. Attention on follow-up recommended. 6.  Uterine fibroids. 7.  Aortic Atherosclerois (ICD10-170.0)   05/19/2017 Procedure   Port placement by Dr. Donell Beers on 9/11/8   05/22/2017 - 10/09/2017 Chemotherapy    neoadjuvant chemotherpy AC every 2 weeks for 4 cycles starting 05/22/17 and completed on 07/07/17, followed by weekly paclitaxel for 12 cycles starting 07/24/17 to complete on 10/09/17    05/22/2017 Pathology Results   Diagnosis Breast, left, needle core biopsy, lower inner quad - DUCTAL CARCINOMA IN SITU. 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  The genes analyzed were the 46 genes on [facility]'s Common Cancers panel (APC, ATM, AXIN2, BARD1, BMPR1A, BRCA1, BRCA2, BRIP1, CDH1, CDKN2A, CHEK2, CTNNA1, DICER1, EPCAM, GREM1, HOXB13, KIT, MEN1, MLH1, MSH2, MSH3, MSH6, MUTYH, NBN, NF1, NTHL1, PALB2, PDGFRA, PMS2, POLD1, POLE, PTEN, RAD50, RAD51C, RAD51D, SDHA, SDHB, SDHC, SDHD, SMAD4, SMARCA4, STK11, TP53, TSC1, TSC2, VHL).     08/14/2017 Echocardiogram   EF 60-65%  Study Conclusions   - Left ventricle: The cavity size was normal. Systolic function was   normal. The estimated ejection fraction was in the range of 60%   to 65%. Wall motion was normal; there were no regional wall   motion abnormalities. Doppler parameters are consistent with   abnormal left ventricular relaxation (grade 1 diastolic   dysfunction). - Pulmonary arteries: PA peak pressure: 31 mm Hg (S).   Impressions:   - No change from September 2018 study (strain rate not performed on   that study).       10/14/2017 Imaging   MRI Breast Bilateral  IMPRESSION:  Significant improvement following neoadjuvant treatment.   No residual enhancement in the left breast at sites of known malignancy.  RECOMMENDATION: Treatment plan for known malignancy.   11/19/2017 Surgery   BILATERAL MASTECTOMIES WITH LEFT SENTINEL LYMPH NODE BIOPSY by Dr.  Donell Beers 11/19/17   11/19/2017 Pathology Results    Diagnosis 11/19/17 1. Breast, simple mastectomy, Right - FIBROCYSTIC AND FIBROADENOMATOID CHANGE. - NO MALIGNANCY IDENTIFIED. 2. Breast, simple mastectomy, Left - NO RESIDUAL CARCINOMA IDENTIFIED. - BIOPSY SITES. - FIBROCYSTIC AND FIBROADENOMATOID CHANGE. - SEE ONCOLOGY TABLE. 3. Lymph node, sentinel, biopsy, Left Axillary #1 - ONE OF ONE LYMPH NODES NEGATIVE FOR CARCINOMA (0/1). - BIOPSY SITE. 4. Lymph node, sentinel, biopsy, Left Axillary # 2 - ONE OF ONE LYMPH NODES NEGATIVE FOR CARCINOMA (0/1).   12/29/2018 Imaging   CT CAP W Contrast  IMPRESSION: No evidence of recurrent or metastatic carcinoma. No other acute findings.   3.5 cm right uterine fibroid.   Colonic diverticulosis, without radiographic evidence of diverticulitis.        Discussed the use of AI scribe software for clinical note transcription with the patient, who gave verbal consent to proceed.  History of Present Illness   The patient, a 57 year old female with a history of triple-negative breast cancer diagnosed in 2018, presents with intermittent throat discomfort for over a month. The discomfort is described as a burning or dry sensation that comes and goes, sometimes waking her from sleep. She also reports a sensation of a lump in her throat when swallowing, but denies difficulty swallowing or changes in appetite. The patient has not sought treatment for this symptom and has been managing it by drinking water.  In addition to her throat discomfort, the patient has been trying to lose weight due to a diagnosis of prediabetes. She reports making dietary changes, including cutting out cake and soda from her diet and increasing her water intake. She has also started walking for exercise.  The patient also mentions a spot identified in a previous MRI, which she is concerned about. She reports no discomfort related to this spot. She also mentions having arthritis  in her back, which sometimes limits her ability to walk.         All other systems were reviewed with the patient and are negative.  MEDICAL HISTORY:  Past Medical History:  Diagnosis Date   Anemia    Ankle edema 05/26/2022   Arthritis    Breast cancer (HCC)    Cancer (HCC)    left breast cancer   Family history of breast cancer    Genetic testing 05/28/2017   Common Cancers panel (46 genes) @ [facility] - No pathogenic mutations detected   GERD (gastroesophageal reflux disease)    Hair loss 09/12/2021   Headache    Migraines   Hypertension    Non-healing surgical wound    right chest wall    SURGICAL HISTORY: Past Surgical History:  Procedure Laterality Date   BREAST SURGERY     biopsy   CESAREAN SECTION     CHOLECYSTECTOMY N/A 02/07/2013   Procedure: LAPAROSCOPIC CHOLECYSTECTOMY WITH INTRAOPERATIVE CHOLANGIOGRAM;  Surgeon: Cherylynn Ridges, MD;  Location: [facility];  Service: General;  Laterality: N/A;   COLONOSCOPY W/ BIOPSIES AND POLYPECTOMY     COLONOSCOPY W/ BIOPSIES AND POLYPECTOMY     EXCISION OF BREAST LESION Right 06/08/2019   Procedure: EXCISION OF NON HEALING WOUND RIGHT CHEST;  Surgeon: Almond Lint, MD;  Location: [facility];  Service: General;  Laterality: Right;   FOOT SURGERY  2003   lt foot bunionectomy   MASTECTOMY W/ SENTINEL NODE BIOPSY Bilateral  11/19/2017   Procedure: BILATERAL MASTECTOMIES WITH LEFT SENTINEL LYMPH NODE BIOPSY;  Surgeon: Almond Lint, MD;  Location: [facility];  Service: General;  Laterality: Bilateral;   MULTIPLE TOOTH EXTRACTIONS     PORTACATH PLACEMENT Right 05/19/2017   Procedure: INSERTION PORT-A-CATH;  Surgeon: Almond Lint, MD;  Location: [facility];  Service: General;  Laterality: Right;    I have reviewed the social history and family history with the patient and they are unchanged from previous note.  ALLERGIES:  has No Known Allergies.  MEDICATIONS:  Current Outpatient Medications  Medication Sig  Dispense Refill   acetaminophen (TYLENOL) 500 MG tablet Take 1,000 mg by mouth every 8 (eight) hours as needed for moderate pain or headache.     amLODipine (NORVASC) 5 MG tablet TAKE 1 TABLET(5 MG) BY MOUTH DAILY 90 tablet 3   Ascorbic Acid (VITAMIN C) 1000 MG tablet Take 1,000 mg by mouth daily.     atorvastatin (LIPITOR) 20 MG tablet Take 1 tablet by mouth daily.     Multiple Vitamins-Minerals (HAIR/SKIN/NAILS) CAPS Take 3 capsules by mouth daily.     Prenatal Vit-Fe Fumarate-FA (PRENATAL VITAMIN PO) Take by mouth daily.     spironolactone (ALDACTONE) 25 MG tablet TAKE 1/2 TABLET(12.5 MG) BY MOUTH DAILY 45 tablet 3   No current facility-administered medications for this visit.   Facility-Administered Medications Ordered in Other Visits  Medication Dose Route Frequency Provider Last Rate Last Admin   heparin lock flush 100 unit/mL  500 Units Intravenous Once PRN Malachy Mood, MD       sodium chloride flush (NS) 0.9 % injection 10 mL  10 mL Intravenous PRN Malachy Mood, MD   10 mL at 08/07/17 1354   sodium chloride flush (NS) 0.9 % injection 10 mL  10 mL Intravenous PRN Malachy Mood, MD        PHYSICAL EXAMINATION: ECOG PERFORMANCE STATUS: 0 - Asymptomatic  Vitals:   07/27/23 0950  BP: 128/86  Pulse: 76  Resp: 17  Temp: 97.7 F (36.5 C)  SpO2: 96%   Wt Readings from Last 3 Encounters:  07/27/23 216 lb 8 oz (98.2 kg)  02/05/23 217 lb 6.4 oz (98.6 kg)  08/21/22 222 lb 14.4 oz (101.1 kg)     GENERAL:alert, no distress and comfortable SKIN: skin color, texture, turgor are normal, no rashes or significant lesions EYES: normal, Conjunctiva are pink and non-injected, sclera clear MOUTH: Mild mucosal erythema in the throat area, no visible mass or other abnormal findings. NECK: supple, thyroid normal size, non-tender, without nodularity LYMPH:  no palpable lymphadenopathy in the cervical, axillary  LUNGS: clear to auscultation and percussion with normal breathing effort HEART: regular rate  & rhythm and no murmurs and no lower extremity edema ABDOMEN:abdomen soft, non-tender and normal bowel sounds Musculoskeletal:no cyanosis of digits and no clubbing  NEURO: alert & oriented x 3 with fluent speech, no focal motor/sensory deficits Breast: Status post bilateral mastectomy, no palpable nodule or skin change on chest wall.  No axillary adenopathy.  LABORATORY DATA:  I have reviewed the data as listed    Latest Ref Rng & Units 07/27/2023    9:30 AM 08/21/2022    1:58 PM 02/28/2022   12:44 PM  CBC  WBC 4.0 - 10.5 K/uL 6.3  8.2  9.1   Hemoglobin 12.0 - 15.0 g/dL 13.0  86.5  78.4   Hematocrit 36.0 - 46.0 % 37.9  37.4  41.4   Platelets 150 - 400 K/uL 349  317  368         Latest Ref Rng & Units 07/27/2023    9:30 AM 08/21/2022    1:58 PM 02/28/2022   12:44 PM  CMP  Glucose 70 - 99 mg/dL 621  308  657   BUN 6 - 20 mg/dL 7  10  10    Creatinine 0.44 - 1.00 mg/dL 8.46  9.62  9.52   Sodium 135 - 145 mmol/L 140  140  141   Potassium 3.5 - 5.1 mmol/L 4.0  3.6  4.3   Chloride 98 - 111 mmol/L 104  102  102   CO2 22 - 32 mmol/L 31  32  29   Calcium 8.9 - 10.3 mg/dL 9.4  9.8  9.7   Total Protein 6.5 - 8.1 g/dL 7.4  7.1  7.5   Total Bilirubin <1.2 mg/dL 0.6  0.8  1.3   Alkaline Phos 38 - 126 U/L 77  96  84   AST 15 - 41 U/L 23  30  48   ALT 0 - 44 U/L 23  31  41       RADIOGRAPHIC STUDIES: I have personally reviewed the radiological images as listed and agreed with the findings in the report. No results found.    No orders of the defined types were placed in this encounter.  All questions were answered. The patient knows to call the clinic with any problems, questions or concerns. No barriers to learning was detected. The total time spent in the appointment was 20 minutes.     Malachy Mood, MD 07/27/2023

## 2023-07-28 ENCOUNTER — Encounter (INDEPENDENT_AMBULATORY_CARE_PROVIDER_SITE_OTHER): Payer: Self-pay | Admitting: Internal Medicine

## 2023-07-28 ENCOUNTER — Ambulatory Visit (INDEPENDENT_AMBULATORY_CARE_PROVIDER_SITE_OTHER): Payer: Federal, State, Local not specified - PPO | Admitting: Internal Medicine

## 2023-07-28 VITALS — BP 115/71 | HR 97 | Temp 98.4°F | Ht 66.0 in | Wt 211.0 lb

## 2023-07-28 DIAGNOSIS — E782 Mixed hyperlipidemia: Secondary | ICD-10-CM | POA: Diagnosis not present

## 2023-07-28 DIAGNOSIS — I10 Essential (primary) hypertension: Secondary | ICD-10-CM | POA: Diagnosis not present

## 2023-07-28 DIAGNOSIS — R7303 Prediabetes: Secondary | ICD-10-CM | POA: Insufficient documentation

## 2023-07-28 DIAGNOSIS — Z6834 Body mass index (BMI) 34.0-34.9, adult: Secondary | ICD-10-CM

## 2023-07-28 DIAGNOSIS — Z853 Personal history of malignant neoplasm of breast: Secondary | ICD-10-CM

## 2023-07-28 DIAGNOSIS — E66811 Obesity, class 1: Secondary | ICD-10-CM

## 2023-07-28 DIAGNOSIS — R638 Other symptoms and signs concerning food and fluid intake: Secondary | ICD-10-CM | POA: Insufficient documentation

## 2023-07-28 NOTE — Assessment & Plan Note (Signed)
Patient reports having an A1c of 6.7 recently.  I explained to her that this is in the range of diabetes and needs to be confirmed.  She has a diet with high consumption of simple sugars so we discussed the carb insulin model of obesity also and effects on insulin resistance and risk for diabetes.  She will begin reducing simple carbs in her diet.  We will repeat hemoglobin A1c with intake labs and confirm diagnosis of type 2 diabetes if she meets criteria.

## 2023-07-28 NOTE — Assessment & Plan Note (Signed)
We discussed the role of healthy diet and maintaining a healthy weight to reduce the risk of cancer recurrence.

## 2023-07-28 NOTE — Assessment & Plan Note (Signed)
I looked in care everywhere cannot find most recent lipid panel.  She is currently on atorvastatin without any adverse effects.  We will check a fasting lipid panel and assess cardiovascular risk with intake labs.

## 2023-07-28 NOTE — Assessment & Plan Note (Signed)
We reviewed anthropometrics, biometrics, associated medical conditions and contributing factors with patient. she would benefit from a medically tailored reduced calorie nutrional plan based on her REE (resting energy expenditure), which will be determined by indirect calorimetry.  We will also assess for cardiometabolic risk and nutritional derangements via fasting labs at intake appointment.

## 2023-07-28 NOTE — Assessment & Plan Note (Addendum)
Blood pressure is well-controlled on amlodipine and spironolactone.  Losing 10% of body weight may improve blood pressure control.  We will check renal parameters with intake labs.  Continue current regimen

## 2023-07-28 NOTE — Progress Notes (Signed)
Office: [phone]  /  Fax: [phone]   Initial Visit  Briana Price was seen in clinic today to evaluate for obesity. She is interested in losing weight to improve overall health and reduce the risk of weight related complications. She presents today to review program treatment options, initial physical assessment, and evaluation.     She was referred by: Friend or Family  When asked what else they would like to accomplish? She states: Adopt healthier eating patterns, Improve existing medical conditions, and Improve quality of life  Weight history: After pregnancy gained weight 80 lbs.   When asked how has your weight affected you? She states: Contributed to medical problems, Having fatigue, Having poor endurance, and Problems with eating patterns  Some associated conditions: Hypertension, Hyperlipidemia, and Prediabetes  Contributing factors: Family history of obesity, Disruption of circadian rhythm / sleep disordered breathing, Consumption of processed foods, Moderate to high levels of stress, Reduced physical activity, Eating patterns, Menopause, and Strong orexigenic signaling and inadequate inhibitory control   Weight promoting medications identified: None  Current nutrition plan: None  Current level of physical activity: Walking  Current or previous pharmacotherapy: Phentermine  Response to medication: Lost weight initially but was unable to sustain weight loss   Past medical history includes:   Past Medical History:  Diagnosis Date   Anemia    Ankle edema 05/26/2022   Arthritis    Breast cancer (HCC)    Cancer (HCC)    left breast cancer   Family history of breast cancer    Genetic testing 05/28/2017   Common Cancers panel (46 genes) @ [facility] - No pathogenic mutations detected   GERD (gastroesophageal reflux disease)    Hair loss 09/12/2021   Headache    Migraines   Hypertension    Non-healing surgical wound    right chest wall     Objective:    BP 115/71   Pulse 97   Temp 98.4 F (36.9 C)   Ht 5\' 6"  (1.676 m)   Wt 211 lb (95.7 kg)   SpO2 96%   BMI 34.06 kg/m  She was weighed on the bioimpedance scale: Body mass index is 34.06 kg/m.  Peak Weight:234 , Body Fat%:46, Visceral Fat Rating:12, Weight trend over the last 12 months: Decreasing  General:  Alert, oriented and cooperative. Patient is in no acute distress.  Respiratory: Normal respiratory effort, no problems with respiration noted   Gait: able to ambulate independently  Mental Status: Normal mood and affect. Normal behavior. Normal judgment and thought content.   DIAGNOSTIC DATA REVIEWED:  BMET    Component Value Date/Time   NA 140 07/27/2023 0930   NA 140 12/31/2021 1422   NA 138 09/11/2017 0850   K 4.0 07/27/2023 0930   K 3.7 09/11/2017 0850   CL 104 07/27/2023 0930   CO2 31 07/27/2023 0930   CO2 28 09/11/2017 0850   GLUCOSE 111 (H) 07/27/2023 0930   GLUCOSE 116 09/11/2017 0850   BUN 7 07/27/2023 0930   BUN 9 12/31/2021 1422   BUN 9.1 09/11/2017 0850   CREATININE 0.68 07/27/2023 0930   CREATININE 0.7 09/11/2017 0850   CALCIUM 9.4 07/27/2023 0930   CALCIUM 9.1 09/11/2017 0850   GFRNONAA >60 07/27/2023 0930   GFRAA >60 04/19/2020 1413   GFRAA >60 01/17/2019 1337   No results found for: "HGBA1C" No results found for: "INSULIN" CBC    Component Value Date/Time   WBC 6.3 07/27/2023 0930   WBC 9.1 02/28/2022 1244  RBC 4.43 07/27/2023 0930   HGB 13.1 07/27/2023 0930   HGB 11.2 (L) 09/11/2017 0850   HCT 37.9 07/27/2023 0930   HCT 33.7 (L) 09/11/2017 0850   PLT 349 07/27/2023 0930   PLT 406 (H) 09/11/2017 0850   MCV 85.6 07/27/2023 0930   MCV 87.2 09/11/2017 0850   MCH 29.6 07/27/2023 0930   MCHC 34.6 07/27/2023 0930   RDW 12.2 07/27/2023 0930   RDW 16.9 (H) 09/11/2017 0850   Iron/TIBC/Ferritin/ %Sat No results found for: "IRON", "TIBC", "FERRITIN", "IRONPCTSAT" Lipid Panel  No results found for: "CHOL", "TRIG", "HDL", "CHOLHDL", "VLDL",  "LDLCALC", "LDLDIRECT" Hepatic Function Panel     Component Value Date/Time   PROT 7.4 07/27/2023 0930   PROT 7.0 09/11/2017 0850   ALBUMIN 4.2 07/27/2023 0930   ALBUMIN 3.7 09/11/2017 0850   AST 23 07/27/2023 0930   AST 21 09/11/2017 0850   ALT 23 07/27/2023 0930   ALT 29 09/11/2017 0850   ALKPHOS 77 07/27/2023 0930   ALKPHOS 64 09/11/2017 0850   BILITOT 0.6 07/27/2023 0930   BILITOT 0.61 09/11/2017 0850      Component Value Date/Time   TSH 1.020 03/05/2022 1101     Assessment and Plan:   Essential (primary) hypertension Assessment & Plan: Blood pressure is well-controlled on amlodipine and spironolactone.  Losing 10% of body weight may improve blood pressure control.  We will check renal parameters with intake labs.  Continue current regimen   Mixed hyperlipidemia Assessment & Plan: I looked in care everywhere cannot find most recent lipid panel.  She is currently on atorvastatin without any adverse effects.  We will check a fasting lipid panel and assess cardiovascular risk with intake labs.      History of left breast cancer Assessment & Plan: We discussed the role of healthy diet and maintaining a healthy weight to reduce the risk of cancer recurrence.     Class 1 obesity with serious comorbidity and body mass index (BMI) of 34.0 to 34.9 in adult, unspecified obesity type Assessment & Plan: We reviewed anthropometrics, biometrics, associated medical conditions and contributing factors with patient. she would benefit from a medically tailored reduced calorie nutrional plan based on her REE (resting energy expenditure), which will be determined by indirect calorimetry.  We will also assess for cardiometabolic risk and nutritional derangements via fasting labs at intake appointment.     Prediabetes Assessment & Plan: Patient reports having an A1c of 6.7 recently.  I explained to her that this is in the range of diabetes and needs to be confirmed.  She has a diet  with high consumption of simple sugars so we discussed the carb insulin model of obesity also and effects on insulin resistance and risk for diabetes.  She will begin reducing simple carbs in her diet.  We will repeat hemoglobin A1c with intake labs and confirm diagnosis of type 2 diabetes if she meets criteria.   Abnormal food appetite Assessment & Plan: Patient has strong cravings for sweets some of it triggered by habituation, stress, boredom and enticing environments.  She may have type 2 diabetes as a recent diagnosis and therefore benefits from reducing simple and processed carbs from her diet and increasing consumption of fibrous foods.  We discussed cognitive restructuring and addressing some of the triggers by nonfood means.  Also creating barriers to these foods would help her reduce consumption of these.         Obesity Treatment / Action Plan:  Patient will work  on garnering support from family and friends to begin weight loss journey. Will work on eliminating or reducing the presence of highly palatable, calorie dense foods in the home. Will complete provided nutritional and psychosocial assessment questionnaire before the next appointment. Will be scheduled for indirect calorimetry to determine resting energy expenditure in a fasting state.  This will allow [place] to create a reduced calorie, high-protein meal plan to promote loss of fat mass while preserving muscle mass. Counseled on the health benefits of losing 5%-15% of total body weight. Was counseled on nutritional approaches to weight loss and benefits of reducing processed foods and consuming plant-based foods and high quality protein as part of nutritional weight management. Was counseled on pharmacotherapy and role as an adjunct in weight management.   Obesity Education Performed Today:  She was weighed on the bioimpedance scale and results were discussed and documented in the synopsis.  We discussed obesity as a disease  and the importance of a more detailed evaluation of all the factors contributing to the disease.  We discussed the importance of long term lifestyle changes which include nutrition, exercise and behavioral modifications as well as the importance of customizing this to her specific health and social needs.  We discussed the benefits of reaching a healthier weight to alleviate the symptoms of existing conditions and reduce the risks of the biomechanical, metabolic and psychological effects of obesity.  Alphia Moh appears to be in the action stage of change and states they are ready to start intensive lifestyle modifications and behavioral modifications.  I have spent 40 minutes in the care of the patient today including: preparing to see patient (e.g. review and interpretation of tests, old notes ), obtaining and/or reviewing separately obtained history, performing a medically appropriate examination or evaluation, counseling and educating the patient, documenting clinical information in the electronic or other health care record, and independently interpreting results and communicating results to the patient, family, or caregiver   Reviewed by clinician on day of visit: allergies, medications, problem list, medical history, surgical history, family history, social history, and previous encounter notes pertinent to obesity diagnosis.   Worthy Rancher, MD

## 2023-07-28 NOTE — Assessment & Plan Note (Signed)
Patient has strong cravings for sweets some of it triggered by habituation, stress, boredom and enticing environments.  She may have type 2 diabetes as a recent diagnosis and therefore benefits from reducing simple and processed carbs from her diet and increasing consumption of fibrous foods.  We discussed cognitive restructuring and addressing some of the triggers by nonfood means.  Also creating barriers to these foods would help her reduce consumption of these.

## 2023-07-29 ENCOUNTER — Telehealth: Payer: Self-pay

## 2023-07-29 NOTE — Telephone Encounter (Signed)
Notified patient that her FMLA document had been completed and place upfront for pick up as requested. Patient planing to pick up documents Friday 11/22. No further concerns at this time.

## 2023-08-19 DIAGNOSIS — Z6833 Body mass index (BMI) 33.0-33.9, adult: Secondary | ICD-10-CM | POA: Diagnosis not present

## 2023-08-19 DIAGNOSIS — E1165 Type 2 diabetes mellitus with hyperglycemia: Secondary | ICD-10-CM | POA: Diagnosis not present

## 2023-08-19 DIAGNOSIS — E669 Obesity, unspecified: Secondary | ICD-10-CM | POA: Diagnosis not present

## 2023-08-19 DIAGNOSIS — E782 Mixed hyperlipidemia: Secondary | ICD-10-CM | POA: Diagnosis not present

## 2023-09-15 ENCOUNTER — Encounter: Payer: Self-pay | Admitting: Hematology

## 2023-09-15 DIAGNOSIS — Z0289 Encounter for other administrative examinations: Secondary | ICD-10-CM

## 2023-10-21 ENCOUNTER — Ambulatory Visit (INDEPENDENT_AMBULATORY_CARE_PROVIDER_SITE_OTHER): Payer: Federal, State, Local not specified - PPO | Admitting: Internal Medicine

## 2023-10-21 ENCOUNTER — Encounter: Payer: Self-pay | Admitting: Hematology

## 2023-10-21 ENCOUNTER — Encounter (INDEPENDENT_AMBULATORY_CARE_PROVIDER_SITE_OTHER): Payer: Self-pay | Admitting: Internal Medicine

## 2023-10-21 VITALS — BP 134/82 | HR 74 | Temp 97.8°F | Ht 66.0 in | Wt 207.0 lb

## 2023-10-21 DIAGNOSIS — R7303 Prediabetes: Secondary | ICD-10-CM | POA: Diagnosis not present

## 2023-10-21 DIAGNOSIS — R0602 Shortness of breath: Secondary | ICD-10-CM

## 2023-10-21 DIAGNOSIS — I10 Essential (primary) hypertension: Secondary | ICD-10-CM | POA: Diagnosis not present

## 2023-10-21 DIAGNOSIS — Z1331 Encounter for screening for depression: Secondary | ICD-10-CM | POA: Diagnosis not present

## 2023-10-21 DIAGNOSIS — Z6834 Body mass index (BMI) 34.0-34.9, adult: Secondary | ICD-10-CM

## 2023-10-21 DIAGNOSIS — E782 Mixed hyperlipidemia: Secondary | ICD-10-CM | POA: Diagnosis not present

## 2023-10-21 DIAGNOSIS — R5383 Other fatigue: Secondary | ICD-10-CM

## 2023-10-21 DIAGNOSIS — R638 Other symptoms and signs concerning food and fluid intake: Secondary | ICD-10-CM

## 2023-10-21 DIAGNOSIS — I517 Cardiomegaly: Secondary | ICD-10-CM

## 2023-10-21 DIAGNOSIS — E66811 Obesity, class 1: Secondary | ICD-10-CM

## 2023-10-21 NOTE — Assessment & Plan Note (Signed)
I reviewed labs on her phone she had an A1c of 6.7 few months ago at her PCPs office this has to be confirmed as she may have diagnosis of diabetes.  We will check A1c, fasting blood sugar and insulin levels today.  She was educated on the carb insulin model of obesity and will start a reduced calorie nutrition plan with a low glycemic load.  She may also be a candidate for treatment with metformin or GLP-1.

## 2023-10-21 NOTE — Assessment & Plan Note (Signed)
On atorvastatin.  Check fasting lipid profile today and assess cardiovascular risk at the next office visit

## 2023-10-21 NOTE — Assessment & Plan Note (Signed)
Blood pressure is well-controlled on amlodipine and spironolactone.  Losing 10% of body weight may improve blood pressure control.  We will check renal parameters with intake labs.  Continue current regimen

## 2023-10-21 NOTE — Assessment & Plan Note (Signed)
See obesity treatment plan

## 2023-10-21 NOTE — Assessment & Plan Note (Addendum)
Her EKG shows LVH by voltage criteria.  I reviewed ECHO from 2023 which did not show LVH.  Her blood pressure today is better.  She will continue on current regimen.  She will follow-up with her cardiologist in the near future.

## 2023-10-21 NOTE — Assessment & Plan Note (Signed)
Patient has strong cravings for sweets some of it triggered by habituation, stress, boredom and enticing environments.  She may have type 2 diabetes as a recent diagnosis and therefore benefits from reducing simple and processed carbs from her diet and increasing consumption of fibrous foods.  We discussed cognitive restructuring and addressing some of the triggers by nonfood means.  Also creating barriers to these foods would help her reduce consumption of these.

## 2023-10-21 NOTE — Progress Notes (Signed)
Office: [phone]  /  Fax: [phone]   Subjective   Initial Visit  Briana Price (MR# [id number]) is a 58 y.o. female who presents for evaluation and treatment of obesity and related comorbidities. Current BMI is Body mass index is 33.41 kg/m. Briana Price has been struggling with her weight for many years and has been unsuccessful in either losing weight, maintaining weight loss, or reaching her healthy weight goal.  Briana Price is currently in the action stage of change and ready to dedicate time achieving and maintaining a healthier weight. Briana Price is interested in becoming our patient and working on intensive lifestyle modifications including (but not limited to) diet and exercise for weight loss.  When asked how their weight has affected their life and health, she states: Contributed to medical problems, Having fatigue, Having poor endurance, and Problems with eating patterns  When asked what else they would like to accomplish? She states: Improve energy levels and physical activity, Improve existing medical conditions, and Improve quality of life  Weight history:  She starting to note weight gain during :  after pregnancy, gained 80 lbs .  Life events associated with weight gain include : pregnancy.   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  Their highest weight has been: 220 lbs.  Desired weight: 125 pounds  Previous weight-loss programs : None.  Their maximum weight loss was: Not applicable lbs.  Their greatest challenge with dieting: never tried dieting before.  Current or previous pharmacotherapy: Phentermine.  Response to medication: Lost weight initially but was unable to sustain weight  loss  Nutritional History:  Current nutrition plan: None.  How many times do you eat outside the home: 2-4 per week  How often do they skip meals: skips 1-2 meals a day  What beverages do they drink: water, juice 1-2 per week, and unsweetened tea 1-2 per week.   Use of artificial sweetners : No  Food intolerances or dislikes: none.  Food triggers: Stress, Boredom, and To help comfort self.  Food cravings: Sugary and Starches / Carbohydrates  Do they struggle with excessive hunger or portion control : No   Current level of physical activity: Walking > 120 minutes  Past medical history includes:   Past Medical History:  Diagnosis Date   Anemia    Ankle edema 05/26/2022   Arthritis    Breast cancer (HCC)    Cancer (HCC)    left breast cancer   Family history of breast cancer    Genetic testing 05/28/2017   Common Cancers panel (46 genes) @ [facility] - No pathogenic mutations detected   GERD (gastroesophageal reflux disease)    Hair loss 09/12/2021   Headache    Migraines   Hypertension    Non-healing surgical wound    right chest wall     Objective   BP 134/82   Pulse 74   Temp 97.8 F (36.6 C)   Ht 5\' 6"  (1.676 m)   Wt 207 lb (93.9 kg)   SpO2 99%   BMI 33.41 kg/m  She was weighed on the bioimpedance scale: Body mass index is 33.41 kg/m.    Anthropometrics:  Vitals Temp: 97.8 F (36.6 C) BP: 134/82 Pulse Rate: 74 SpO2: 99 %   Anthropometric Measurements Height: 5\' 6"  (1.676 m) Weight: 207 lb (93.9 kg)  BMI (Calculated): 33.43 Peak Weight: 250 lb Waist Measurement : 42 inches   Body Composition  Body Fat %: 44.9 % Fat Mass (lbs): 93 lbs Muscle Mass (lbs): 108.2 lbs Total Body Water (lbs): 75.6 lbs Visceral Fat Rating : 12   Other Clinical Data RMR: 1642 Fasting: Yes Labs: Yes Today's Visit #: 1 Starting Date: 10/21/23    Physical Exam:  General: She is overweight, cooperative, alert, well developed, and in no acute  distress. PSYCH: Has normal mood, affect and thought process.   HEENT: EOMI, sclerae are anicteric. Lungs: Normal breathing effort, no conversational dyspnea. Extremities: No edema.  Neurologic: No gross sensory or motor deficits. No tremors or fasciculations noted.    Diagnostic Data Reviewed  EKG: Normal sinus rhythm, rate 62 bpm.  She has LVH by voltage criteria.  EKG is unchanged from previous EKG no conduction abnormalities, abnormal Q waves or chamber enlargement.  Indirect Calorimeter completed today shows a VO2 of 238 and a REE of 2305.  Her calculated basal metabolic rate is 0454 thus her resting energy expenditure faster than calculated.  Depression Screen  Briana Price's PHQ-9 score was: 7 .      No data to display          Screening for Sleep Related Breathing Disorders Loreal denies daytime somnolence and denies waking up still tired. Patient denies a history of having symptoms of OSA.Briana Price generally gets 4 or 5 hours of sleep per night, and states that she does not sleep well most nights. Snoring is present. Apneic episodes are not present. Epworth Sleepiness Score is 4.    BMET    Component Value Date/Time   NA 140 07/27/2023 0930   NA 140 12/31/2021 1422   NA 138 09/11/2017 0850   K 4.0 07/27/2023 0930   K 3.7 09/11/2017 0850   CL 104 07/27/2023 0930   CO2 31 07/27/2023 0930   CO2 28 09/11/2017 0850   GLUCOSE 111 (H) 07/27/2023 0930   GLUCOSE 116 09/11/2017 0850   BUN 7 07/27/2023 0930   BUN 9 12/31/2021 1422   BUN 9.1 09/11/2017 0850   CREATININE 0.68 07/27/2023 0930   CREATININE 0.7 09/11/2017 0850   CALCIUM 9.4 07/27/2023 0930   CALCIUM 9.1 09/11/2017 0850   GFRNONAA >60 07/27/2023 0930   GFRAA >60 04/19/2020 1413   GFRAA >60 01/17/2019 1337   No results found for: "HGBA1C" No results found for: "INSULIN" CBC    Component Value Date/Time   WBC 6.3 07/27/2023 0930   WBC 9.1 02/28/2022 1244   RBC 4.43 07/27/2023 0930   HGB 13.1  07/27/2023 0930   HGB 11.2 (L) 09/11/2017 0850   HCT 37.9 07/27/2023 0930   HCT 33.7 (L) 09/11/2017 0850   PLT 349 07/27/2023 0930   PLT 406 (H) 09/11/2017 0850   MCV 85.6 07/27/2023 0930   MCV 87.2 09/11/2017 0850   MCH 29.6 07/27/2023 0930   MCHC 34.6 07/27/2023 0930   RDW 12.2 07/27/2023 0930   RDW 16.9 (H) 09/11/2017 0850   Iron/TIBC/Ferritin/ %Sat No results found for: "IRON", "TIBC", "FERRITIN", "IRONPCTSAT" Lipid Panel  No results found for: "CHOL", "TRIG", "HDL", "CHOLHDL", "VLDL", "LDLCALC", "LDLDIRECT" Hepatic Function Panel     Component Value Date/Time   PROT 7.4 07/27/2023 0930   PROT 7.0 09/11/2017 0850   ALBUMIN 4.2 07/27/2023 0930   ALBUMIN 3.7 09/11/2017 0850   AST 23 07/27/2023 0930   AST 21 09/11/2017 0850   ALT 23 07/27/2023 0930   ALT  29 09/11/2017 0850   ALKPHOS 77 07/27/2023 0930   ALKPHOS 64 09/11/2017 0850   BILITOT 0.6 07/27/2023 0930   BILITOT 0.61 09/11/2017 0850      Component Value Date/Time   TSH 1.020 03/05/2022 1101     Assessment and Plan   TREATMENT PLAN FOR OBESITY:  Recommended Dietary Goals  Mariann is currently in the action stage of change. As such, her goal is to implement medically supervised weight loss plan.  She has agreed to implement: a tailored, multi-day, AI generated meal plan targeting 1200 kcal and 90 -120 grams of protein per day.  She does not eat meat but okay with seafood, dairy and eggs  Behavioral Intervention  We discussed the following Behavioral Modification Strategies today: increasing lean protein intake to established goals, decreasing simple carbohydrates , increasing vegetables, increasing lower glycemic fruits, increasing fiber rich foods, avoiding skipping meals, increasing water intake, work on meal planning and preparation, reading food labels , keeping healthy foods at home, identifying sources and decreasing liquid calories, decreasing eating out or consumption of processed foods, and making  healthy choices when eating convenient foods, planning for success, and better snacking choices  Additional resources provided today: Handout on healthy eating and balanced plate and Handout on complex carbohydrates and lean sources of protein  Recommended Physical Activity Goals  Deerica has been advised to work up to 150 minutes of moderate intensity aerobic activity a week and strengthening exercises 2-3 times per week for cardiovascular health, weight loss maintenance and preservation of muscle mass.   She has agreed to :  Start strengthening exercises with a goal of 2-3 sessions a week   Pharmacotherapy We will work on building a Therapist, art and behavioral strategies. We will discuss the role of pharmacotherapy as an adjunct at subsequent visits.   ASSOCIATED CONDITIONS ADDRESSED TODAY  Other Fatigue Briana Price denies daytime somnolence and denies waking up still tired. Patient denies a history of having symptoms of OSA.Briana Price generally gets 4 or 5 hours of sleep per night, and states that she does not sleep well most nights. Snoring is present. Apneic episodes are not present. Epworth Sleepiness Score is 4. Briana Price does feel that her weight is causing her energy to be lower than it should be. Fatigue may be related to obesity, depression or many other causes. Labs will be ordered, and in the meanwhile, Briana Price will focus on self care including making healthy food choices, increasing physical activity and focusing on stress reduction.  Shortness of Breath Briana Price notes increasing shortness of breath with exercising and seems to be worsening over time with weight gain. She notes getting out of breath sooner with activity than she used to. This has not gotten worse recently. Briana Price denies shortness of breath at rest or orthopnea.Briana Price notes increasing shortness of breath with exercising and seems to be worsening over time with weight gain.  She notes getting out of breath sooner with activity than she used to. This has not gotten worse recently. Briana Price denies shortness of breath at rest or orthopnea.  Other fatigue -     EKG 12-Lead -     CBC with Differential/Platelet -     Vitamin B12  SOB (shortness of breath) on exertion  Depression screen  Essential (primary) hypertension Assessment & Plan: Blood pressure is well-controlled on amlodipine and spironolactone.  Losing 10% of body weight may improve blood pressure control.  We will check renal parameters with intake labs.  Continue current regimen  Mixed hyperlipidemia Assessment & Plan: On atorvastatin.  Check fasting lipid profile today and assess cardiovascular risk at the next office visit     Class 1 obesity with serious comorbidity and body mass index (BMI) of 34.0 to 34.9 in adult, unspecified obesity type Assessment & Plan: See obesity treatment plan  Orders: -     CMP14+EGFR -     Hemoglobin A1c -     Insulin, random -     VITAMIN D 25 Hydroxy (Vit-D Deficiency, Fractures) -     Vitamin B12 -     Lipid Panel With LDL/HDL Ratio -     Microalbumin / creatinine urine ratio -     TSH  Prediabetes Assessment & Plan: I reviewed labs on her phone she had an A1c of 6.7 few months ago at her PCPs office this has to be confirmed as she may have diagnosis of diabetes.  We will check A1c, fasting blood sugar and insulin levels today.  She was educated on the carb insulin model of obesity and will start a reduced calorie nutrition plan with a low glycemic load.  She may also be a candidate for treatment with metformin or GLP-1.  Orders: -     CMP14+EGFR -     Hemoglobin A1c -     Insulin, random -     Lipid Panel With LDL/HDL Ratio -     Microalbumin / creatinine urine ratio  Abnormal food appetite Assessment & Plan: Patient has strong cravings for sweets some of it triggered by habituation, stress, boredom and enticing environments.  She may  have type 2 diabetes as a recent diagnosis and therefore benefits from reducing simple and processed carbs from her diet and increasing consumption of fibrous foods.  We discussed cognitive restructuring and addressing some of the triggers by nonfood means.  Also creating barriers to these foods would help her reduce consumption of these.   Left ventricular hypertrophy by electrocardiogram Assessment & Plan: Her EKG shows LVH by voltage criteria.  I reviewed ECHO from 2023 which did not show LVH.  Her blood pressure today is better.  She will continue on current regimen.  She will follow-up with her cardiologist in the near future.     Follow-up  She was informed of the importance of frequent follow-up visits to maximize her success with intensive lifestyle modifications for her multiple health conditions. She was informed we would discuss her lab results at her next visit unless there is a critical issue that needs to be addressed sooner. Briana Price agreed to keep her next visit at the agreed upon time to discuss these results.  Attestation Statement  This is the patient's intake visit at [facility]. The patient's Health Questionnaire was reviewed at length. Included in the packet: current and past health history, medications, allergies, ROS, gynecologic history (women only), surgical history, family history, social history, weight history, weight loss surgery history (for those that have had weight loss surgery), nutritional evaluation, mood and food questionnaire, PHQ9, Epworth questionnaire, sleep habits questionnaire, patient life and health improvement goals questionnaire. These will all be scanned into the patient's chart under media.   During the visit, I independently reviewed the patient's EKG, previous labs, bioimpedance scale results, and indirect calorimetry results. I used this information to medically tailor a meal plan for the patient that will help her to lose  weight and will improve her obesity-related conditions. I performed a medically necessary appropriate examination and/or evaluation. I discussed the  assessment and treatment plan with the patient. The patient was provided an opportunity to ask questions and all were answered. The patient agreed with the plan and demonstrated an understanding of the instructions. Labs were ordered at this visit and will be reviewed at the next visit unless critical results need to be addressed immediately. Clinical information was updated and documented in the EMR.   In addition, they received basic education on identification of processed foods and reduction of these, different sources of lean proteins and complex carbohydrates and how to eat balanced by incorporation of whole foods.  Reviewed by clinician on day of visit: allergies, medications, problem list, medical history, surgical history, family history, social history, and previous encounter notes.  I have spent 60 minutes in the care of the patient today including: preparing to see patient (e.g. review and interpretation of tests, old notes ), obtaining and/or reviewing separately obtained history, performing a medically appropriate examination or evaluation, counseling and educating the patient, ordering medications, test or procedures, documenting clinical information in the electronic or other health care record, and independently interpreting results and communicating results to the patient, family, or caregiver      Worthy Rancher, MD

## 2023-10-22 LAB — CMP14+EGFR
ALT: 25 IU/L (ref 0–32)
AST: 27 IU/L (ref 0–40)
Albumin: 4.9 g/dL (ref 3.8–4.9)
Alkaline Phosphatase: 107 IU/L (ref 44–121)
BUN/Creatinine Ratio: 14 (ref 9–23)
BUN: 10 mg/dL (ref 6–24)
Bilirubin Total: 0.7 mg/dL (ref 0.0–1.2)
CO2: 26 mmol/L (ref 20–29)
Calcium: 10.1 mg/dL (ref 8.7–10.2)
Chloride: 99 mmol/L (ref 96–106)
Creatinine, Ser: 0.7 mg/dL (ref 0.57–1.00)
Globulin, Total: 3 g/dL (ref 1.5–4.5)
Glucose: 93 mg/dL (ref 70–99)
Potassium: 3.8 mmol/L (ref 3.5–5.2)
Sodium: 140 mmol/L (ref 134–144)
Total Protein: 7.9 g/dL (ref 6.0–8.5)
eGFR: 101 mL/min/1.73

## 2023-10-22 LAB — VITAMIN D 25 HYDROXY (VIT D DEFICIENCY, FRACTURES): Vit D, 25-Hydroxy: 40.9 ng/mL (ref 30.0–100.0)

## 2023-10-22 LAB — CBC WITH DIFFERENTIAL/PLATELET
Basophils Absolute: 0.1 10*3/uL (ref 0.0–0.2)
Basos: 1 %
EOS (ABSOLUTE): 0.1 10*3/uL (ref 0.0–0.4)
Eos: 2 %
Hematocrit: 44.3 % (ref 34.0–46.6)
Hemoglobin: 14.8 g/dL (ref 11.1–15.9)
Immature Grans (Abs): 0 10*3/uL (ref 0.0–0.1)
Immature Granulocytes: 0 %
Lymphocytes Absolute: 2.3 10*3/uL (ref 0.7–3.1)
Lymphs: 30 %
MCH: 29 pg (ref 26.6–33.0)
MCHC: 33.4 g/dL (ref 31.5–35.7)
MCV: 87 fL (ref 79–97)
Monocytes Absolute: 0.4 10*3/uL (ref 0.1–0.9)
Monocytes: 6 %
Neutrophils Absolute: 4.7 10*3/uL (ref 1.4–7.0)
Neutrophils: 61 %
Platelets: 375 10*3/uL (ref 150–450)
RBC: 5.1 x10E6/uL (ref 3.77–5.28)
RDW: 12.3 % (ref 11.7–15.4)
WBC: 7.6 10*3/uL (ref 3.4–10.8)

## 2023-10-22 LAB — LIPID PANEL WITH LDL/HDL RATIO
Cholesterol, Total: 131 mg/dL (ref 100–199)
HDL: 47 mg/dL
LDL Chol Calc (NIH): 68 mg/dL (ref 0–99)
LDL/HDL Ratio: 1.4 ratio (ref 0.0–3.2)
Triglycerides: 82 mg/dL (ref 0–149)
VLDL Cholesterol Cal: 16 mg/dL (ref 5–40)

## 2023-10-22 LAB — MICROALBUMIN / CREATININE URINE RATIO
Creatinine, Urine: 46.9 mg/dL
Microalb/Creat Ratio: 9 mg/g{creat} (ref 0–29)
Microalbumin, Urine: 4 ug/mL

## 2023-10-22 LAB — HEMOGLOBIN A1C
Est. average glucose Bld gHb Est-mCnc: 131 mg/dL
Hgb A1c MFr Bld: 6.2 % — ABNORMAL HIGH (ref 4.8–5.6)

## 2023-10-22 LAB — INSULIN, RANDOM: INSULIN: 6.9 u[IU]/mL (ref 2.6–24.9)

## 2023-10-22 LAB — VITAMIN B12: Vitamin B-12: 854 pg/mL (ref 232–1245)

## 2023-10-22 LAB — TSH: TSH: 0.798 u[IU]/mL (ref 0.450–4.500)

## 2023-11-04 ENCOUNTER — Encounter (INDEPENDENT_AMBULATORY_CARE_PROVIDER_SITE_OTHER): Payer: Self-pay | Admitting: Internal Medicine

## 2023-11-04 ENCOUNTER — Ambulatory Visit (INDEPENDENT_AMBULATORY_CARE_PROVIDER_SITE_OTHER): Payer: Federal, State, Local not specified - PPO | Admitting: Internal Medicine

## 2023-11-04 VITALS — BP 127/76 | HR 84 | Temp 97.9°F | Ht 66.0 in | Wt 201.0 lb

## 2023-11-04 DIAGNOSIS — E66811 Obesity, class 1: Secondary | ICD-10-CM | POA: Diagnosis not present

## 2023-11-04 DIAGNOSIS — I10 Essential (primary) hypertension: Secondary | ICD-10-CM

## 2023-11-04 DIAGNOSIS — R7303 Prediabetes: Secondary | ICD-10-CM

## 2023-11-04 DIAGNOSIS — E782 Mixed hyperlipidemia: Secondary | ICD-10-CM

## 2023-11-04 DIAGNOSIS — Z6834 Body mass index (BMI) 34.0-34.9, adult: Secondary | ICD-10-CM

## 2023-11-04 NOTE — Assessment & Plan Note (Signed)
 LDL is at goal. Elevated LDL may be secondary to nutrition, genetics and spillover effect from excess adiposity. Recommended LDL goal is <70 to reduce the risk of fatty streaks and the progression to obstructive ASCVD in the future.   Her 10 year risk is: The 10-year ASCVD risk score (Arnett DK, et al., 2019) is: 4%  Lab Results  Component Value Date   CHOL 131 10/21/2023   HDL 47 10/21/2023   LDLCALC 68 10/21/2023   TRIG 82 10/21/2023    Currently on atorvastatin.  Continue weight loss therapy, losing 10% or more of body weight may improve condition. Also advised to reduce saturated fats in diet to less than 10% of daily calories.

## 2023-11-04 NOTE — Assessment & Plan Note (Signed)
 Most recent A1c is  Lab Results  Component Value Date   HGBA1C 6.2 (H) 10/21/2023    Patient aware of disease state and risk of progression. This may contribute to abnormal cravings, fatigue and diabetic complications without having diabetes.   We have discussed treatment options which include: losing 7 to 10% of body weight, increasing physical activity to a goal of 150 minutes a week at moderate intensity.  Advised to maintain a diet low on simple and processed carbohydrates.  We discussed the benefits of metformin at present time she declines pharmacoprophylaxis.

## 2023-11-04 NOTE — Progress Notes (Signed)
 The 10-year ASCVD risk score (Arnett DK, et al., 2019) is: 4%   Office: [phone]  /  Fax: [phone]  Weight Summary And Biometrics  Vitals Temp: 97.9 F (36.6 C) BP: 127/76 Pulse Rate: 84 SpO2: 96 %   Anthropometric Measurements Height: 5\' 6"  (1.676 m) Weight: 201 lb (91.2 kg) BMI (Calculated): 32.46 Weight at Last Visit: 207 lb Weight Lost Since Last Visit: 6 lb Weight Gained Since Last Visit: 0 Starting Weight: 207 lb Total Weight Loss (lbs): 6 lb (2.722 kg) Peak Weight: 250 lb   Body Composition  Body Fat %: 44.2 % Fat Mass (lbs): 89.2 lbs Muscle Mass (lbs): 106.8 lbs Total Body Water (lbs): 74.4 lbs Visceral Fat Rating : 11    RMR: 1642  Today's Visit #: 2  Starting Date: 10/21/23   Subjective   Chief Complaint: Obesity  Interval History Discussed the use of AI scribe software for clinical note transcription with the patient, who gave verbal consent to proceed.  History of Present Illness   Briana Price is a 58 year old female with obesity, hypertension, hyperlipidemia, and prediabetes who presents for medical weight management.  She has lost six pounds since her last visit, attributing this to adherence to a reduced calorie nutrition plan targeting 1200 calories per day, which she follows 90% of the time. She incorporates more whole foods, fruits, and the recommended amount of protein into her diet, while staying hydrated and reducing processed food intake. She exercises by walking five days a week for 25 minutes each session.  She experiences difficulty eating three meals a day, often skipping lunch due to lack of hunger. She feels full or not hungry, and when attempting to eat. Her breakfast typically includes scrambled eggs with spinach or boiled eggs with toast, and she drinks hot tea in the morning and at night.  She sleeps about three to six hours per night and denies experiencing high levels of stress. She has a history of smoking for a  brief period in college.  Her fasting blood sugar was 93, and her A1c indicates prediabetes. Her LDL cholesterol is low at 68, and her HDL is 47. She is on medication for her cholesterol. Her potassium level is 3.8, which is low normal, and she is on a diuretic. She takes prenatal vitamins as a multivitamin.       Challenges affecting patient progress: none.    Pharmacotherapy for weight management: She is currently taking no anti-obesity medication.   Assessment and Plan   Treatment Plan For Obesity:  Recommended Dietary Goals  Briana Price is currently in the action stage of change. As such, her goal is to continue weight management plan. She has agreed to: continue current plan  Behavioral Health and Counseling  We discussed the following behavioral modification strategies today: continue to work on maintaining a reduced calorie state, getting the recommended amount of protein, incorporating whole foods, making healthy choices, staying well hydrated and practicing mindfulness when eating..  Additional education and resources provided today: Handout on increasing daily activity and exercise goal setting  Recommended Physical Activity Goals  Briana Price has been advised to work up to 150 minutes of moderate intensity aerobic activity a week and strengthening exercises 2-3 times per week for cardiovascular health, weight loss maintenance and preservation of muscle mass.   She has agreed to :  Start strengthening exercises with a goal of 2-3 sessions a week  and Start aerobic activity with a goal of 150 minutes a week at  moderate intensity.   Pharmacotherapy  We discussed various medication options to help Briana Price with her weight loss efforts and we both agreed to : continue with nutritional and behavioral strategies and has declined pharmacotherapy  Associated Conditions Impacted by Obesity Treatment  Essential (primary) hypertension Assessment & Plan: Blood pressure is  well-controlled on amlodipine and spironolactone. Recent renal parameters are within normal limits  Losing 10% of body weight may improve blood pressure control.  Continue current regimen   Mixed hyperlipidemia Assessment & Plan: LDL is at goal. Elevated LDL may be secondary to nutrition, genetics and spillover effect from excess adiposity. Recommended LDL goal is <70 to reduce the risk of fatty streaks and the progression to obstructive ASCVD in the future.   Her 10 year risk is: The 10-year ASCVD risk score (Arnett DK, et al., 2019) is: 4%  Lab Results  Component Value Date   CHOL 131 10/21/2023   HDL 47 10/21/2023   LDLCALC 68 10/21/2023   TRIG 82 10/21/2023    Currently on atorvastatin.  Continue weight loss therapy, losing 10% or more of body weight may improve condition. Also advised to reduce saturated fats in diet to less than 10% of daily calories.          Class 1 obesity with serious comorbidity and body mass index (BMI) of 34.0 to 34.9 in adult, unspecified obesity type Assessment & Plan: - Refer to obesity management plan - Continue with current weight management strategy. - Discussed adjustments to overcome identified barriers. - Reinforce dietary and exercise goals.    Prediabetes Assessment & Plan: Most recent A1c is  Lab Results  Component Value Date   HGBA1C 6.2 (H) 10/21/2023    Patient aware of disease state and risk of progression. This may contribute to abnormal cravings, fatigue and diabetic complications without having diabetes.   We have discussed treatment options which include: losing 7 to 10% of body weight, increasing physical activity to a goal of 150 minutes a week at moderate intensity.  Advised to maintain a diet low on simple and processed carbohydrates.  We discussed the benefits of metformin at present time she declines pharmacoprophylaxis.         General Health Maintenance Up to date on breast cancer screening (double  mastectomy), colonoscopy (last year, no polyps), and cervical cancer screening (next due in June or July). Cardiovascular risk is low (4% ten-year risk). - Continue routine health maintenance screenings - Discuss any new symptoms with PCP  Follow-up - Schedule follow-up appointment in 3 weeks.       Objective   Physical Exam:  Blood pressure 127/76, pulse 84, temperature 97.9 F (36.6 C), height 5\' 6"  (1.676 m), weight 201 lb (91.2 kg), SpO2 96%. Body mass index is 32.44 kg/m.  General: She is overweight, cooperative, alert, well developed, and in no acute distress. PSYCH: Has normal mood, affect and thought process.   HEENT: EOMI, sclerae are anicteric. Lungs: Normal breathing effort, no conversational dyspnea. Extremities: No edema.  Neurologic: No gross sensory or motor deficits. No tremors or fasciculations noted.    Diagnostic Data Reviewed:  BMET    Component Value Date/Time   NA 140 10/21/2023 1124   NA 138 09/11/2017 0850   K 3.8 10/21/2023 1124   K 3.7 09/11/2017 0850   CL 99 10/21/2023 1124   CO2 26 10/21/2023 1124   CO2 28 09/11/2017 0850   GLUCOSE 93 10/21/2023 1124   GLUCOSE 111 (H) 07/27/2023 0930   GLUCOSE 116  09/11/2017 0850   BUN 10 10/21/2023 1124   BUN 9.1 09/11/2017 0850   CREATININE 0.70 10/21/2023 1124   CREATININE 0.68 07/27/2023 0930   CREATININE 0.7 09/11/2017 0850   CALCIUM 10.1 10/21/2023 1124   CALCIUM 9.1 09/11/2017 0850   GFRNONAA >60 07/27/2023 0930   GFRAA >60 04/19/2020 1413   GFRAA >60 01/17/2019 1337   Lab Results  Component Value Date   HGBA1C 6.2 (H) 10/21/2023   Lab Results  Component Value Date   INSULIN 6.9 10/21/2023   Lab Results  Component Value Date   TSH 0.798 10/21/2023   CBC    Component Value Date/Time   WBC 7.6 10/21/2023 1124   WBC 6.3 07/27/2023 0930   WBC 9.1 02/28/2022 1244   RBC 5.10 10/21/2023 1124   RBC 4.43 07/27/2023 0930   HGB 14.8 10/21/2023 1124   HGB 11.2 (L) 09/11/2017 0850   HCT  44.3 10/21/2023 1124   HCT 33.7 (L) 09/11/2017 0850   PLT 375 10/21/2023 1124   MCV 87 10/21/2023 1124   MCV 87.2 09/11/2017 0850   MCH 29.0 10/21/2023 1124   MCH 29.6 07/27/2023 0930   MCHC 33.4 10/21/2023 1124   MCHC 34.6 07/27/2023 0930   RDW 12.3 10/21/2023 1124   RDW 16.9 (H) 09/11/2017 0850   Iron Studies No results found for: "IRON", "TIBC", "FERRITIN", "IRONPCTSAT" Lipid Panel     Component Value Date/Time   CHOL 131 10/21/2023 1124   TRIG 82 10/21/2023 1124   HDL 47 10/21/2023 1124   LDLCALC 68 10/21/2023 1124   Hepatic Function Panel     Component Value Date/Time   PROT 7.9 10/21/2023 1124   PROT 7.0 09/11/2017 0850   ALBUMIN 4.9 10/21/2023 1124   ALBUMIN 3.7 09/11/2017 0850   AST 27 10/21/2023 1124   AST 23 07/27/2023 0930   AST 21 09/11/2017 0850   ALT 25 10/21/2023 1124   ALT 23 07/27/2023 0930   ALT 29 09/11/2017 0850   ALKPHOS 107 10/21/2023 1124   ALKPHOS 64 09/11/2017 0850   BILITOT 0.7 10/21/2023 1124   BILITOT 0.6 07/27/2023 0930   BILITOT 0.61 09/11/2017 0850      Component Value Date/Time   TSH 0.798 10/21/2023 1124   Nutritional Lab Results  Component Value Date   VD25OH 40.9 10/21/2023    Medications: Outpatient Encounter Medications as of 11/04/2023  Medication Sig   acetaminophen (TYLENOL) 500 MG tablet Take 1,000 mg by mouth every 8 (eight) hours as needed for moderate pain (pain score 4-6) or headache.   amLODipine (NORVASC) 5 MG tablet TAKE 1 TABLET(5 MG) BY MOUTH DAILY   Ascorbic Acid (VITAMIN C) 1000 MG tablet Take 1,000 mg by mouth daily.   atorvastatin (LIPITOR) 20 MG tablet Take 1 tablet by mouth daily.   CINNAMON PO Take 2,000 mg by mouth daily at 12 noon.   Magnesium 400 MG CAPS Take by mouth daily at 12 noon.   Multiple Vitamins-Minerals (HAIR/SKIN/NAILS) CAPS Take 3 capsules by mouth daily.   Prenatal Vit-Fe Fumarate-FA (PRENATAL VITAMIN PO) Take by mouth daily.   spironolactone (ALDACTONE) 25 MG tablet TAKE 1/2  TABLET(12.5 MG) BY MOUTH DAILY   Facility-Administered Encounter Medications as of 11/04/2023  Medication   heparin lock flush 100 unit/mL   sodium chloride flush (NS) 0.9 % injection 10 mL   sodium chloride flush (NS) 0.9 % injection 10 mL     Follow-Up   Return in about 3 weeks (around 11/25/2023) for For Weight  Mangement with Dr. Rikki Spearing.Marland Kitchen She was informed of the importance of frequent follow up visits to maximize her success with intensive lifestyle modifications for her multiple health conditions.  Attestation Statement   Reviewed by clinician on day of visit: allergies, medications, problem list, medical history, surgical history, family history, social history, and previous encounter notes.     Worthy Rancher, MD

## 2023-11-04 NOTE — Assessment & Plan Note (Addendum)
-   Refer to obesity management plan - Continue with current weight management strategy. - Discussed adjustments to overcome identified barriers. - Reinforce dietary and exercise goals.

## 2023-11-04 NOTE — Assessment & Plan Note (Signed)
 Blood pressure is well-controlled on amlodipine and spironolactone. Recent renal parameters are within normal limits  Losing 10% of body weight may improve blood pressure control.  Continue current regimen

## 2023-12-07 ENCOUNTER — Encounter (INDEPENDENT_AMBULATORY_CARE_PROVIDER_SITE_OTHER): Payer: Self-pay | Admitting: Internal Medicine

## 2023-12-07 ENCOUNTER — Ambulatory Visit (INDEPENDENT_AMBULATORY_CARE_PROVIDER_SITE_OTHER): Payer: Federal, State, Local not specified - PPO | Admitting: Internal Medicine

## 2023-12-07 VITALS — BP 131/86 | HR 82 | Temp 97.9°F | Ht 66.0 in | Wt 201.0 lb

## 2023-12-07 DIAGNOSIS — E66811 Obesity, class 1: Secondary | ICD-10-CM

## 2023-12-07 DIAGNOSIS — E669 Obesity, unspecified: Secondary | ICD-10-CM | POA: Diagnosis not present

## 2023-12-07 DIAGNOSIS — Z6832 Body mass index (BMI) 32.0-32.9, adult: Secondary | ICD-10-CM | POA: Diagnosis not present

## 2023-12-07 DIAGNOSIS — R7303 Prediabetes: Secondary | ICD-10-CM

## 2023-12-07 DIAGNOSIS — I10 Essential (primary) hypertension: Secondary | ICD-10-CM

## 2023-12-07 NOTE — Progress Notes (Signed)
 Office: [phone]  /  Fax: [phone]  Weight Summary And Biometrics  Vitals Temp: 97.9 F (36.6 C) BP: 131/86 Pulse Rate: 82 SpO2: 98 %   Anthropometric Measurements Height: 5\' 6"  (1.676 m) Weight: 201 lb (91.2 kg) BMI (Calculated): 32.46 Weight at Last Visit: 201 lb Weight Lost Since Last Visit: 0 Weight Gained Since Last Visit: 0 Starting Weight: 207 lb Total Weight Loss (lbs): 6 lb (2.722 kg) Peak Weight: 250 lb   Body Composition  Body Fat %: 44.1 % Fat Mass (lbs): 88.8 lbs Muscle Mass (lbs): 106.8 lbs Total Body Water (lbs): 75.8 lbs Visceral Fat Rating : 11    RMR: 1642  Today's Visit #: 3  Starting Date: 10/21/23   Subjective   Chief Complaint: Obesity  Interval History Discussed the use of AI scribe software for clinical note transcription with the patient, who gave verbal consent to proceed.  History of Present Illness Briana Price is a 58 year old female who presents for medical weight management.  Since her last visit, she has maintained her weight after previously losing six pounds. There have been no significant changes in her routine over the past four weeks, continuing with the same level of physical activity and diet. She has difficulty consuming enough protein and notes a possible increase in carbohydrate intake, including eating Ritz crackers with cheese and whole grain bread.  She maintains a regular walking routine, walking during her lunch break and doing cardio five days a week for 45 to 60 minutes. She reports adequate sleep and good water intake, although there is a decrease from eight bottles a day to five. She occasionally drinks no sugar ginger ale and has had a couple of small Pepsis. Her diet includes eggs, cottage cheese, [place] yogurt, and seafood, but she avoids meat. She sometimes skips meals due to lack of hunger, which she attributes to nausea if she eats when not hungry. Typical meals include bread and eggs for  breakfast, and vegetables or a sub for dinner. She acknowledges possibly not eating enough of the right foods and skipping meals more frequently than at the start of her weight loss journey.  She has a history of prediabetes with an A1c of 6.2, down from 6.7. She is aware of her sensitivity to carbohydrates and the impact on her weight loss. She is not currently on any medications that cause weight gain.  She reports experiencing hot flashes and questions whether she might be menopausal, although her doctor previously indicated she is not. She inquires about the use of pumpkin seed oil for hot flashes and gut health, noting a concern about interactions with her cholesterol medication, atorvastatin.    Challenges affecting patient progress: slow metabolism for age and blunted response to calorie restriction .    Pharmacotherapy for weight management: She is currently taking no anti-obesity medication and patient has declined pharmacotherapy in past.   Assessment and Plan   Treatment Plan For Obesity:  Recommended Dietary Goals  Briana Price is currently in the action stage of change. As such, her goal is to continue weight management plan. She has agreed to: continue current plan  Behavioral Health and Counseling  We discussed the following behavioral modification strategies today: increasing lean protein intake to established goals, decreasing simple carbohydrates , increasing vegetables, increasing lower glycemic fruits, increasing fiber rich foods, avoiding skipping meals, increasing water intake , and continue to work on maintaining a reduced calorie state, getting the recommended amount of protein, incorporating whole foods,  making healthy choices, staying well hydrated and practicing mindfulness when eating..  Additional education and resources provided today: None  Recommended Physical Activity Goals  Briana Price has been advised to work up to 150 minutes of moderate intensity aerobic  activity a week and strengthening exercises 2-3 times per week for cardiovascular health, weight loss maintenance and preservation of muscle mass.   She has agreed to :  continue to gradually increase the amount and intensity of exercise routine  Pharmacotherapy  We discussed various medication options to help Briana Price with her weight loss efforts and we both agreed to : continue with nutritional and behavioral strategies and has declined pharmacotherapy  Associated Conditions Impacted by Obesity Treatment  There are no diagnoses linked to this encounter.  Assessment and Plan Assessment & Plan Weight Management She has maintained her weight since the last visit, having previously lost six pounds. There is no significant change in physical activity or diet, although there may be a decrease in protein intake and an increase in simple carbohydrates, such as Ritz crackers. Insufficient protein intake may affect her metabolism and weight maintenance. She is not experiencing hunger and often skips meals, which may also contribute to her weight maintenance. - Increase protein intake to 90 grams per day, distributed as 30 grams with each meal. - Consider protein smoothies or shakes with 30 grams of protein and low carbohydrates. - Track and journal food intake using apps like MyFitnessPal or Calorie Counter by [employer]. - Maintain a daily caloric intake of 1200 calories, with 360 calories from protein and the remainder from carbohydrates and healthy fats. - Continue current physical activity level of 45-60 minutes of cardio, five days a week. - Reassess in four weeks.  Prediabetes Her A1c was previously 6.2, indicating prediabetes. Metformin was discussed as a potential option to reduce insulin levels, prevent diabetes, and aid in weight loss. Metformin can reduce the risk of developing diabetes by 30%, lower A1c, help with cravings for sweets, improve gut bacteria, and reduce visceral fat.  However, she prefers to focus on dietary and lifestyle modifications rather than starting medication at this time. - Monitor A1c levels and repeat testing in six months. - Focus on dietary modifications, including reducing carbohydrate intake and increasing protein intake.  Menopausal Symptoms She reports experiencing hot flashes, although her previous doctor indicated she is not menopausal. She inquired about using pumpkin seed oil for symptom relief and gut health. Pumpkin seed oil should not interact with her current statin medication unless it contains red yeast rice. - Consider using pumpkin seed oil in moderation, ensuring it does not contain red yeast rice.  General Health Maintenance Guidance on using health apps to track dietary intake is provided, emphasizing the importance of balanced nutrition. - Maintain adequate hydration, aiming for at least five bottles of water per day. - Use health apps to track dietary intake and ensure balanced nutrition.      Objective   Physical Exam:  Blood pressure 131/86, pulse 82, temperature 97.9 F (36.6 C), height 5\' 6"  (1.676 m), weight 201 lb (91.2 kg), SpO2 98%. Body mass index is 32.44 kg/m.  General: She is overweight, cooperative, alert, well developed, and in no acute distress. PSYCH: Has normal mood, affect and thought process.   HEENT: EOMI, sclerae are anicteric. Lungs: Normal breathing effort, no conversational dyspnea. Extremities: No edema.  Neurologic: No gross sensory or motor deficits. No tremors or fasciculations noted.    Diagnostic Data Reviewed:  BMET    Component  Value Date/Time   NA 140 10/21/2023 1124   NA 138 09/11/2017 0850   K 3.8 10/21/2023 1124   K 3.7 09/11/2017 0850   CL 99 10/21/2023 1124   CO2 26 10/21/2023 1124   CO2 28 09/11/2017 0850   GLUCOSE 93 10/21/2023 1124   GLUCOSE 111 (H) 07/27/2023 0930   GLUCOSE 116 09/11/2017 0850   BUN 10 10/21/2023 1124   BUN 9.1 09/11/2017 0850   CREATININE  0.70 10/21/2023 1124   CREATININE 0.68 07/27/2023 0930   CREATININE 0.7 09/11/2017 0850   CALCIUM 10.1 10/21/2023 1124   CALCIUM 9.1 09/11/2017 0850   GFRNONAA >60 07/27/2023 0930   GFRAA >60 04/19/2020 1413   GFRAA >60 01/17/2019 1337   Lab Results  Component Value Date   HGBA1C 6.2 (H) 10/21/2023   Lab Results  Component Value Date   INSULIN 6.9 10/21/2023   Lab Results  Component Value Date   TSH 0.798 10/21/2023   CBC    Component Value Date/Time   WBC 7.6 10/21/2023 1124   WBC 6.3 07/27/2023 0930   WBC 9.1 02/28/2022 1244   RBC 5.10 10/21/2023 1124   RBC 4.43 07/27/2023 0930   HGB 14.8 10/21/2023 1124   HGB 11.2 (L) 09/11/2017 0850   HCT 44.3 10/21/2023 1124   HCT 33.7 (L) 09/11/2017 0850   PLT 375 10/21/2023 1124   MCV 87 10/21/2023 1124   MCV 87.2 09/11/2017 0850   MCH 29.0 10/21/2023 1124   MCH 29.6 07/27/2023 0930   MCHC 33.4 10/21/2023 1124   MCHC 34.6 07/27/2023 0930   RDW 12.3 10/21/2023 1124   RDW 16.9 (H) 09/11/2017 0850   Iron Studies No results found for: "IRON", "TIBC", "FERRITIN", "IRONPCTSAT" Lipid Panel     Component Value Date/Time   CHOL 131 10/21/2023 1124   TRIG 82 10/21/2023 1124   HDL 47 10/21/2023 1124   LDLCALC 68 10/21/2023 1124   Hepatic Function Panel     Component Value Date/Time   PROT 7.9 10/21/2023 1124   PROT 7.0 09/11/2017 0850   ALBUMIN 4.9 10/21/2023 1124   ALBUMIN 3.7 09/11/2017 0850   AST 27 10/21/2023 1124   AST 23 07/27/2023 0930   AST 21 09/11/2017 0850   ALT 25 10/21/2023 1124   ALT 23 07/27/2023 0930   ALT 29 09/11/2017 0850   ALKPHOS 107 10/21/2023 1124   ALKPHOS 64 09/11/2017 0850   BILITOT 0.7 10/21/2023 1124   BILITOT 0.6 07/27/2023 0930   BILITOT 0.61 09/11/2017 0850      Component Value Date/Time   TSH 0.798 10/21/2023 1124   Nutritional Lab Results  Component Value Date   VD25OH 40.9 10/21/2023    Medications: Outpatient Encounter Medications as of 12/07/2023  Medication Sig    acetaminophen (TYLENOL) 500 MG tablet Take 1,000 mg by mouth every 8 (eight) hours as needed for moderate pain (pain score 4-6) or headache.   amLODipine (NORVASC) 5 MG tablet TAKE 1 TABLET(5 MG) BY MOUTH DAILY   Ascorbic Acid (VITAMIN C) 1000 MG tablet Take 1,000 mg by mouth daily.   atorvastatin (LIPITOR) 20 MG tablet Take 1 tablet by mouth daily.   CINNAMON PO Take 2,000 mg by mouth daily at 12 noon.   Magnesium 400 MG CAPS Take by mouth daily at 12 noon.   Prenatal Vit-Fe Fumarate-FA (PRENATAL VITAMIN PO) Take by mouth daily.   spironolactone (ALDACTONE) 25 MG tablet TAKE 1/2 TABLET(12.5 MG) BY MOUTH DAILY   Multiple Vitamins-Minerals (HAIR/SKIN/NAILS) CAPS Take  3 capsules by mouth daily. (Patient not taking: Reported on 12/07/2023)   Facility-Administered Encounter Medications as of 12/07/2023  Medication   heparin lock flush 100 unit/mL   sodium chloride flush (NS) 0.9 % injection 10 mL   sodium chloride flush (NS) 0.9 % injection 10 mL     Follow-Up   No follow-ups on file.Marland Kitchen She was informed of the importance of frequent follow up visits to maximize her success with intensive lifestyle modifications for her multiple health conditions.  Attestation Statement   Reviewed by clinician on day of visit: allergies, medications, problem list, medical history, surgical history, family history, social history, and previous encounter notes.   I have spent 30 minutes in the care of the patient today including: preparing to see patient (e.g. review and interpretation of tests, old notes ), obtaining and/or reviewing separately obtained history, counseling and educating the patient, documenting clinical information in the electronic or other health care record, and independently interpreting results and communicating results to the patient, family, or caregiver   Worthy Rancher, MD

## 2023-12-22 DIAGNOSIS — E782 Mixed hyperlipidemia: Secondary | ICD-10-CM | POA: Diagnosis not present

## 2023-12-22 DIAGNOSIS — I10 Essential (primary) hypertension: Secondary | ICD-10-CM | POA: Diagnosis not present

## 2023-12-31 DIAGNOSIS — E782 Mixed hyperlipidemia: Secondary | ICD-10-CM | POA: Diagnosis not present

## 2023-12-31 DIAGNOSIS — I10 Essential (primary) hypertension: Secondary | ICD-10-CM | POA: Diagnosis not present

## 2023-12-31 DIAGNOSIS — E119 Type 2 diabetes mellitus without complications: Secondary | ICD-10-CM | POA: Diagnosis not present

## 2023-12-31 DIAGNOSIS — E669 Obesity, unspecified: Secondary | ICD-10-CM | POA: Diagnosis not present

## 2024-01-07 ENCOUNTER — Ambulatory Visit (INDEPENDENT_AMBULATORY_CARE_PROVIDER_SITE_OTHER): Admitting: Internal Medicine

## 2024-01-07 ENCOUNTER — Encounter (INDEPENDENT_AMBULATORY_CARE_PROVIDER_SITE_OTHER): Payer: Self-pay | Admitting: Internal Medicine

## 2024-01-07 VITALS — BP 125/83 | HR 79 | Temp 98.2°F | Ht 66.0 in | Wt 197.0 lb

## 2024-01-07 DIAGNOSIS — N951 Menopausal and female climacteric states: Secondary | ICD-10-CM

## 2024-01-07 DIAGNOSIS — I10 Essential (primary) hypertension: Secondary | ICD-10-CM

## 2024-01-07 DIAGNOSIS — R7303 Prediabetes: Secondary | ICD-10-CM

## 2024-01-07 DIAGNOSIS — M5430 Sciatica, unspecified side: Secondary | ICD-10-CM

## 2024-01-07 DIAGNOSIS — Z6834 Body mass index (BMI) 34.0-34.9, adult: Secondary | ICD-10-CM

## 2024-01-07 DIAGNOSIS — E66811 Obesity, class 1: Secondary | ICD-10-CM

## 2024-01-07 NOTE — Progress Notes (Signed)
 Office: [phone]  /  Fax: [phone]  Weight Summary And Biometrics  Vitals Temp: 98.2 F (36.8 C) BP: 125/83 Pulse Rate: 79 SpO2: 98 %   Anthropometric Measurements Height: 5\' 6"  (1.676 m) Weight: 197 lb (89.4 kg) BMI (Calculated): 31.81 Weight at Last Visit: 201 lb Weight Lost Since Last Visit: 4 lb Weight Gained Since Last Visit: 0 Starting Weight: 207 lb Total Weight Loss (lbs): 10 lb (4.536 kg) Peak Weight: 250 lb   Body Composition  Body Fat %: 43.5 % Fat Mass (lbs): 86 lbs Muscle Mass (lbs): 106.2 lbs Total Body Water (lbs): 74.2 lbs Visceral Fat Rating : 11    RMR: 1642  Today's Visit #: 4  Starting Date: 10/21/23   Subjective   Chief Complaint: Obesity  Interval History Discussed the use of AI scribe software for clinical note transcription with the patient, who gave verbal consent to proceed.  History of Present Illness   Briana Price is a 58 year old female who presents for medical weight management.  Since the last visit, she has lost four pounds, totaling a ten-pound weight loss. She adheres to a 1200-calorie meal plan about 70% of the time, focusing on whole foods, adequate protein, and hydration. She sometimes skips meals and exercises five days a week for 45 minutes, primarily cardio. Her metabolic rate is slow, but she has managed steady weight loss. She has not incorporated strength training but has access to a treadmill and elliptical at home. She experiences knee pain when using the treadmill. Her appetite is low, and she often reminds herself to eat. Her diet includes protein shakes, two boiled eggs daily, fish like tuna or salmon, pinto beans, and meat portions no larger than the palm of her hand. She reports increased bathroom regularity and slightly improved energy levels, noting easier stair climbing.  She reports recent onset of pain in the muscle under her buttock when sitting for prolonged periods, causing leg numbness and  limiting her walking ability. No back pain. She works on a [employer], which may contribute to her symptoms due to prolonged sitting.  She has a history of prediabetes managed through diet, focusing on reducing sugar intake, and has not been on medication for this condition.  She experiences hot flashes and suspects menopause, as her menstrual cycle ceased in September 2018 following chemotherapy. She sometimes wakes up hot at night but can return to sleep after adjusting her covers.       Challenges affecting patient progress: none.    Pharmacotherapy for weight management: She is currently taking no anti-obesity medication and patient has declined pharmacotherapy in past.   Assessment and Plan   Treatment Plan For Obesity:  Recommended Dietary Goals  Briana Price is currently in the action stage of change. As such, her goal is to continue weight management plan. She has agreed to: follow the Category 2 plan - 1200 kcal per day  Behavioral Health and Counseling  We discussed the following behavioral modification strategies today: continue to work on maintaining a reduced calorie state, getting the recommended amount of protein, incorporating whole foods, making healthy choices, staying well hydrated and practicing mindfulness when eating..  Additional education and resources provided today: None  Recommended Physical Activity Goals  Briana Price has been advised to work up to 150 minutes of moderate intensity aerobic activity a week and strengthening exercises 2-3 times per week for cardiovascular health, weight loss maintenance and preservation of muscle mass.   She has agreed to :  Think about enjoyable ways to increase daily physical activity and overcoming barriers to exercise and Increase physical activity in their day and reduce sedentary time (increase NEAT).  Pharmacotherapy  We discussed various medication options to help Briana Price with her weight loss efforts and we both  agreed to : continue with nutritional and behavioral strategies and has declined pharmacotherapy  Associated Conditions Impacted by Obesity Treatment  Essential (primary) hypertension  Prediabetes  Class 1 obesity with serious comorbidity and body mass index (BMI) of 34.0 to 34.9 in adult, unspecified obesity type     Assessment and Plan  Obesity She has lost 10 pounds thus far with adequate appetite control.  She is also incorporating more whole foods and has improved mobility.  Continue with current weight management strategy.  She is not interested in pharmacotherapy.  Hypertension Blood pressure is well-controlled on amlodipine  and spironolactone .  Monitor for orthostasis while losing weight continue current regimen    Prediabetes Prediabetes managed through lifestyle modifications with a preference to avoid medications. Current weight loss of 4 pounds over 4 weeks indicates progress. Blood pressure is well-controlled at 125/83 mmHg. Emphasis on exercise and dietary management to improve metabolic rate. She is sensitive to exercise, which is crucial for boosting metabolism. Intermittent fasting is suggested as an additional strategy. - Continue 1200 calorie diet focusing on whole foods and adequate protein intake. - Encourage regular physical activity, including cardio and strength training. - Consider intermittent fasting with a 16:8 schedule to boost metabolic rate. - Monitor weight bi-weekly to track progress.  Sciatica Sciatica likely due to pinched sciatic nerve, possibly from prolonged sitting or incorrect posture during exercise. Symptoms include pain in the buttock and numbness in the leg when sitting for extended periods. Stretching exercises are recommended to alleviate symptoms. - Recommend lower extremity stretches to alleviate nerve compression. - Advise use of YouTube videos for guided stretching exercises.  Menopausal symptoms Menopausal symptoms suspected based on  absence of menstrual cycle since September 2018 and occasional hot flashes. She is past the average age of menopause onset. Symptoms do not significantly disrupt sleep. Blood test for menopause was not previously conducted by her primary care provider. - Order [facility] test during next blood work to confirm menopausal status.        Objective   Physical Exam:  Blood pressure 125/83, pulse 79, temperature 98.2 F (36.8 C), height 5\' 6"  (1.676 m), weight 197 lb (89.4 kg), SpO2 98%. Body mass index is 31.8 kg/m.  General: She is overweight, cooperative, alert, well developed, and in no acute distress. PSYCH: Has normal mood, affect and thought process.   HEENT: EOMI, sclerae are anicteric. Lungs: Normal breathing effort, no conversational dyspnea. Extremities: No edema.  Neurologic: No gross sensory or motor deficits. No tremors or fasciculations noted.    Diagnostic Data Reviewed:  BMET    Component Value Date/Time   NA 140 10/21/2023 1124   NA 138 09/11/2017 0850   K 3.8 10/21/2023 1124   K 3.7 09/11/2017 0850   CL 99 10/21/2023 1124   CO2 26 10/21/2023 1124   CO2 28 09/11/2017 0850   GLUCOSE 93 10/21/2023 1124   GLUCOSE 111 (H) 07/27/2023 0930   GLUCOSE 116 09/11/2017 0850   BUN 10 10/21/2023 1124   BUN 9.1 09/11/2017 0850   CREATININE 0.70 10/21/2023 1124   CREATININE 0.68 07/27/2023 0930   CREATININE 0.7 09/11/2017 0850   CALCIUM 10.1 10/21/2023 1124   CALCIUM 9.1 09/11/2017 0850   GFRNONAA >60 07/27/2023 0930  GFRAA >60 04/19/2020 1413   GFRAA >60 01/17/2019 1337   Lab Results  Component Value Date   HGBA1C 6.2 (H) 10/21/2023   Lab Results  Component Value Date   INSULIN  6.9 10/21/2023   Lab Results  Component Value Date   TSH 0.798 10/21/2023   CBC    Component Value Date/Time   WBC 7.6 10/21/2023 1124   WBC 6.3 07/27/2023 0930   WBC 9.1 02/28/2022 1244   RBC 5.10 10/21/2023 1124   RBC 4.43 07/27/2023 0930   HGB 14.8 10/21/2023 1124   HGB 11.2 (L)  09/11/2017 0850   HCT 44.3 10/21/2023 1124   HCT 33.7 (L) 09/11/2017 0850   PLT 375 10/21/2023 1124   MCV 87 10/21/2023 1124   MCV 87.2 09/11/2017 0850   MCH 29.0 10/21/2023 1124   MCH 29.6 07/27/2023 0930   MCHC 33.4 10/21/2023 1124   MCHC 34.6 07/27/2023 0930   RDW 12.3 10/21/2023 1124   RDW 16.9 (H) 09/11/2017 0850   Iron Studies No results found for: "IRON", "TIBC", "FERRITIN", "IRONPCTSAT" Lipid Panel     Component Value Date/Time   CHOL 131 10/21/2023 1124   TRIG 82 10/21/2023 1124   HDL 47 10/21/2023 1124   LDLCALC 68 10/21/2023 1124   Hepatic Function Panel     Component Value Date/Time   PROT 7.9 10/21/2023 1124   PROT 7.0 09/11/2017 0850   ALBUMIN 4.9 10/21/2023 1124   ALBUMIN 3.7 09/11/2017 0850   AST 27 10/21/2023 1124   AST 23 07/27/2023 0930   AST 21 09/11/2017 0850   ALT 25 10/21/2023 1124   ALT 23 07/27/2023 0930   ALT 29 09/11/2017 0850   ALKPHOS 107 10/21/2023 1124   ALKPHOS 64 09/11/2017 0850   BILITOT 0.7 10/21/2023 1124   BILITOT 0.6 07/27/2023 0930   BILITOT 0.61 09/11/2017 0850      Component Value Date/Time   TSH 0.798 10/21/2023 1124   Nutritional Lab Results  Component Value Date   VD25OH 40.9 10/21/2023    Medications: Outpatient Encounter Medications as of 01/07/2024  Medication Sig   acetaminophen  (TYLENOL ) 500 MG tablet Take 1,000 mg by mouth every 8 (eight) hours as needed for moderate pain (pain score 4-6) or headache.   amLODipine  (NORVASC ) 5 MG tablet TAKE 1 TABLET(5 MG) BY MOUTH DAILY   Ascorbic Acid (VITAMIN C) 1000 MG tablet Take 1,000 mg by mouth daily.   atorvastatin (LIPITOR) 20 MG tablet Take 1 tablet by mouth daily.   CINNAMON PO Take 2,000 mg by mouth daily at 12 noon.   Magnesium 400 MG CAPS Take by mouth daily at 12 noon.   Multiple Vitamins-Minerals (HAIR/SKIN/NAILS) CAPS Take 3 capsules by mouth daily.   Prenatal Vit-Fe Fumarate-FA (PRENATAL VITAMIN PO) Take by mouth daily.   spironolactone  (ALDACTONE ) 25 MG  tablet TAKE 1/2 TABLET(12.5 MG) BY MOUTH DAILY   Facility-Administered Encounter Medications as of 01/07/2024  Medication   heparin  lock flush 100 unit/mL   sodium chloride  flush (NS) 0.9 % injection 10 mL   sodium chloride  flush (NS) 0.9 % injection 10 mL     Follow-Up   Return in about 6 weeks (around 02/18/2024) for For Weight Mangement with Dr. Allie Area.Aaron Aas She was informed of the importance of frequent follow up visits to maximize her success with intensive lifestyle modifications for her multiple health conditions.  Attestation Statement   Reviewed by clinician on day of visit: allergies, medications, problem list, medical history, surgical history, family history, social history,  and previous encounter notes.     Ladd Picker, MD

## 2024-01-25 DIAGNOSIS — M545 Low back pain, unspecified: Secondary | ICD-10-CM | POA: Diagnosis not present

## 2024-01-25 DIAGNOSIS — M5442 Lumbago with sciatica, left side: Secondary | ICD-10-CM | POA: Diagnosis not present

## 2024-02-04 ENCOUNTER — Ambulatory Visit (HOSPITAL_BASED_OUTPATIENT_CLINIC_OR_DEPARTMENT_OTHER): Payer: Federal, State, Local not specified - PPO | Admitting: Family

## 2024-02-04 ENCOUNTER — Encounter (HOSPITAL_BASED_OUTPATIENT_CLINIC_OR_DEPARTMENT_OTHER): Payer: Self-pay | Admitting: Family

## 2024-02-04 VITALS — BP 125/81 | HR 87 | Ht 66.0 in | Wt 208.4 lb

## 2024-02-04 DIAGNOSIS — E785 Hyperlipidemia, unspecified: Secondary | ICD-10-CM | POA: Diagnosis not present

## 2024-02-04 DIAGNOSIS — I10 Essential (primary) hypertension: Secondary | ICD-10-CM

## 2024-02-04 MED ORDER — SPIRONOLACTONE 25 MG PO TABS
12.5000 mg | ORAL_TABLET | Freq: Every day | ORAL | 3 refills | Status: AC
Start: 2024-02-04 — End: ?

## 2024-02-04 MED ORDER — AMLODIPINE BESYLATE 5 MG PO TABS: 5.0000 mg | ORAL_TABLET | Freq: Every day | ORAL | 3 refills | Status: DC

## 2024-02-04 NOTE — Patient Instructions (Addendum)
 Medication Instructions:  Continue your current medications.     Follow-Up: Please follow up in 1 year in [facility] with Dr. Theodis Fiscal, Neomi Banks, NP or Donivan Furry PharmD    Special Instructions:    [employer] on YouTube.

## 2024-02-04 NOTE — Progress Notes (Signed)
 [facility]pertension [facility] Assessment:    Date:  02/04/2024   ID:  Briana, Price [birth year], MRN [id number]  PCP:  Briana Lobstein, MD  Cardiologist:  Briana Sos, MD  Nephrologist:  Referring MD: Briana Lobstein, MD   CC: Hypertension  History of Present Illness:    Briana Price is a 58 y.o. female with a hx of anemia, arthritis, breast cancer s/p bilateral mastectomy, GERD, prediabetes, hypertension here to follow up in the [facility].   Established with [facility] 04/2021 after referral from OB/GYN with BP 138/98 on HCTZ and losartan.  Losartan changed to amlodipine  10 mg daily.  She was enrolled in vivify remote patient monitoring study.  HCTZ transition to chlorthalidone  25 mg daily.  Seen 02/2022 for syncope with chest CT negative for PE and hypotensive with EMS.  Episode occurred after not eating, getting up quickly, walking down the stairs.  Spironolactone  discontinued.  14-day monitor with rare PVC/PAC and short runs of SVT.  Echo 03/2022 normal LVEF 60 to 65%, normal diastolic function.  Last seen 02/05/2023 by Briana Price and was working on lifestyle modifications.  Her BP was well-controlled on amlodipine  5 mg daily, spironolactone  12.5 mg daily.  Presents today for follow up independently. Continued weight loss of an additional ~10 lbs since last clinic visit. Reports no chest pain, pressure, tightness. She will get very rare episode of "pain but not a pain" in her upper chest that independently resolves after a few seconds. Previously occurred in her jaw 5-6 years ago. Tells me it is not "intense". Has not walked in a month due to knee pain which has improved. Up to that time was walking 45 minutes multiple days per week. Plans to resume walking.   Previous antihypertensives: Lisinopril  Losartan  Past Medical History:  Diagnosis Date   Anemia    Ankle edema 05/26/2022   Arthritis    Breast cancer (HCC)    Cancer (HCC)     left breast cancer   Family history of breast cancer    Genetic testing 05/28/2017   Common Cancers panel (46 genes) @ [facility] - No pathogenic mutations detected   GERD (gastroesophageal reflux disease)    Hair loss 09/12/2021   Headache    Migraines   Hypertension    Non-healing surgical wound    right chest wall    Past Surgical History:  Procedure Laterality Date   BREAST SURGERY     biopsy   CESAREAN SECTION     CHOLECYSTECTOMY N/A 02/07/2013   Procedure: LAPAROSCOPIC CHOLECYSTECTOMY WITH INTRAOPERATIVE CHOLANGIOGRAM;  Surgeon: Diantha Fossa, MD;  Location: [facility];  Service: General;  Laterality: N/A;   COLONOSCOPY W/ BIOPSIES AND POLYPECTOMY     COLONOSCOPY W/ BIOPSIES AND POLYPECTOMY     EXCISION OF BREAST LESION Right 06/08/2019   Procedure: EXCISION OF NON HEALING WOUND RIGHT CHEST;  Surgeon: Lockie Rima, MD;  Location: [facility] OR;  Service: General;  Laterality: Right;   FOOT SURGERY  2003   lt foot bunionectomy   MASTECTOMY W/ SENTINEL NODE BIOPSY Bilateral 11/19/2017   Procedure: BILATERAL MASTECTOMIES WITH LEFT SENTINEL LYMPH NODE BIOPSY;  Surgeon: Lockie Rima, MD;  Location: [facility];  Service: General;  Laterality: Bilateral;   MULTIPLE TOOTH EXTRACTIONS     PORTACATH PLACEMENT Right 05/19/2017   Procedure: INSERTION PORT-A-CATH;  Surgeon: Lockie Rima, MD;  Location: [facility] OR;  Service: General;  Laterality: Right;    Current Medications: No outpatient  medications have been marked as taking for the 02/04/24 encounter (Office Visit) with Briana Curia, NP.     Allergies:   Patient has no known allergies.   Social History   Socioeconomic History   Marital status: Married    Spouse name: Hershal Loron   Number of children: Not on file   Years of education: Not on file   Highest education level: Not on file  Occupational History   Occupation: Presenter, broadcasting  Tobacco Use   Smoking status: Former    Types: Cigarettes   Smokeless  tobacco: Never   Tobacco comments:    15-20 years ago  Vaping Use   Vaping status: Never Used  Substance and Sexual Activity   Alcohol use: No   Drug use: No   Sexual activity: Not on file  Other Topics Concern   Not on file  Social History Narrative   Tobacco use   Cigarettes: Never smoked    Tobacco history last updated 01/23/2014   No smoking   No tobacco exposure   No alcohol   Caffeine : yes soda   No recreational drug use   Occupation :employed ,[employer]rie Krebs 01/25/2013   Martial status : single    Children: Gerrie Krebs 01/25/2013   20 year old son   Social Drivers of Corporate investment banker Strain: Not on file  Food Insecurity: Not on file  Transportation Needs: Not on file  Physical Activity: Not on file  Stress: Not on file  Social Connections: Not on file     Family History: The patient's family history includes Breast cancer in her maternal grandmother and other family members; Breast cancer (age of onset: 3) in her paternal aunt; Breast cancer (age of onset: 62) in her maternal aunt; Heart attack in her maternal grandfather; High blood pressure in her father; Hypertension in her mother; Leukemia in her paternal uncle; Obesity in her mother.  ROS:   Please see the history of present illness.     All other systems reviewed and are negative.  EKGs/Labs/Other Studies Reviewed:         Recent Labs: 10/21/2023: ALT 25; BUN 10; Creatinine, Ser 0.70; Hemoglobin 14.8; Platelets 375; Potassium 3.8; Sodium 140; TSH 0.798   Recent Lipid Panel    Component Value Date/Time   CHOL 131 10/21/2023 1124   TRIG 82 10/21/2023 1124   HDL 47 10/21/2023 1124   LDLCALC 68 10/21/2023 1124    Physical Exam:   VS:  BP 125/81   Pulse 87   Ht 5\' 6"  (1.676 m)   Wt 208 lb 6.4 oz (94.5 kg)   SpO2 94%   BMI 33.64 kg/m  , BMI Body mass index is 33.64 kg/m. GENERAL:  Well appearing HEENT: Pupils equal round and reactive, fundi not visualized, oral mucosa  unremarkable NECK:  No jugular venous distention, waveform within normal limits, carotid upstroke brisk and symmetric, no bruits, no thyromegaly LYMPHATICS:  No cervical adenopathy LUNGS:  Clear to auscultation bilaterally HEART:  RRR.  PMI not displaced or sustained,S1 and S2 within normal limits, no S3, no S4, no clicks, no rubs, no murmurs ABD:  Flat, positive bowel sounds normal in frequency in pitch, no bruits, no rebound, no guarding, no midline pulsatile mass, no hepatomegaly, no splenomegaly EXT:  2 plus pulses throughout, no edema, no cyanosis no clubbing SKIN:  No rashes no nodules NEURO:  Cranial nerves II through XII grossly intact, motor grossly intact throughout PSYCH:  Cognitively intact,  oriented to person place and time   ASSESSMENT/PLAN:    HTN - BP well controlled. Continue current antihypertensive regimen Amlodipine  5mg  daily, Spironolactone  12.5mg  daily. Refills provided. Discussed to monitor BP at home at least 2 hours after medications and sitting for 5-10 minutes.   BMI 33 - congratulated on continued weight loss. Discussed adding strength training through [facility] or [facility] program at [facility].   HLD / Aortic atherosclerosis - Continue Atorvastatin 20mg  daily. LDL at goal <70.   Screening for Secondary Hypertension:     05/07/2021    3:38 PM  Causes  Drugs/Herbals Screened     - Comments caffeine, sodium  Renovascular HTN N/A  Sleep Apnea Screened     - Comments snores.  no apea or daytime somnolence  Thyroid  Disease Screened  Hyperaldosteronism N/A  Pheochromocytoma N/A  Cushing's Syndrome N/A  Hyperparathyroidism N/A  Coarctation of the Aorta Screened     - Comments unable to check BP bilaterally 2/2 s/p mastectomy and LN removal  Compliance Screened    Relevant Labs/Studies:    Latest Ref Rng & Units 10/21/2023   11:24 AM 07/27/2023    9:30 AM 08/21/2022    1:58 PM  Basic Labs  Sodium 134 - 144 mmol/L 140  140  140   Potassium 3.5 -  5.2 mmol/L 3.8  4.0  3.6   Creatinine 0.57 - 1.00 mg/dL 0.98  1.19  1.47        Latest Ref Rng & Units 10/21/2023   11:24 AM 03/05/2022   11:01 AM  Thyroid    TSH 0.450 - 4.500 uIU/mL 0.798  1.020                  Disposition:    FU with MD/APP/PharmD in 1 years    Medication Adjustments/Labs and Tests Ordered: Current medicines are reviewed at length with the patient today.  Concerns regarding medicines are outlined above.  No orders of the defined types were placed in this encounter.  Meds ordered this encounter  Medications   amLODipine  (NORVASC ) 5 MG tablet    Sig: Take 1 tablet (5 mg total) by mouth daily.    Dispense:  90 tablet    Refill:  3    Supervising Provider:   Sheryle Donning [[id number]]   spironolactone  (ALDACTONE ) 25 MG tablet    Sig: Take 0.5 tablets (12.5 mg total) by mouth daily.    Dispense:  45 tablet    Refill:  3    Supervising Provider:   Sheryle Donning [[id number]]     Signed, Briana Curia, NP  02/04/2024 8:39 PM    [facility]

## 2024-03-02 ENCOUNTER — Ambulatory Visit (INDEPENDENT_AMBULATORY_CARE_PROVIDER_SITE_OTHER): Admitting: Internal Medicine

## 2024-03-02 ENCOUNTER — Encounter (INDEPENDENT_AMBULATORY_CARE_PROVIDER_SITE_OTHER): Payer: Self-pay | Admitting: Internal Medicine

## 2024-03-02 VITALS — BP 115/78 | HR 71 | Temp 98.3°F | Ht 66.0 in | Wt 200.0 lb

## 2024-03-02 DIAGNOSIS — Z6834 Body mass index (BMI) 34.0-34.9, adult: Secondary | ICD-10-CM

## 2024-03-02 DIAGNOSIS — E66811 Obesity, class 1: Secondary | ICD-10-CM | POA: Diagnosis not present

## 2024-03-02 DIAGNOSIS — R7303 Prediabetes: Secondary | ICD-10-CM

## 2024-03-02 DIAGNOSIS — I10 Essential (primary) hypertension: Secondary | ICD-10-CM | POA: Diagnosis not present

## 2024-03-02 DIAGNOSIS — E782 Mixed hyperlipidemia: Secondary | ICD-10-CM

## 2024-03-02 DIAGNOSIS — Z01419 Encounter for gynecological examination (general) (routine) without abnormal findings: Secondary | ICD-10-CM | POA: Diagnosis not present

## 2024-03-02 NOTE — Progress Notes (Signed)
 Office: [phone]  /  Fax: [phone]  Weight Summary and Body Composition Analysis (BIA)  Vitals Temp: 98.3 F (36.8 C) BP: 115/78 Pulse Rate: 71 SpO2: 97 %   Anthropometric Measurements Height: 5' 6 (1.676 m) Weight: 200 lb (90.7 kg) BMI (Calculated): 32.3 Weight at Last Visit: 197 lb Weight Lost Since Last Visit: 0 lb Weight Gained Since Last Visit: 3 lb Starting Weight: 207 lb Total Weight Loss (lbs): 7 lb (3.175 kg) Peak Weight: 250 lb   Body Composition  Body Fat %: 42.9 % Fat Mass (lbs): 85.8 lbs Muscle Mass (lbs): 108.4 lbs Total Body Water (lbs): 74 lbs Visceral Fat Rating : 11    RMR: 1642  Today's Visit #: 5  Starting Date: 10/21/23   Subjective   Chief Complaint: Obesity  Interval History Discussed the use of AI scribe software for clinical note transcription with the patient, who gave verbal consent to proceed.  History of Present Illness   Briana Price is a 58 year old female who presents for medical weight management.  Since the last visit, she has gained three pounds despite adhering to a 1200 calorie meal plan. She tracks her calories 70% of the time, consumes more whole foods, and meets the recommended protein intake. However, she occasionally skips exercise sessions. She drinks water regularly but not as much as initially, occasionally consumes sugary drinks, and mostly opts for sugar-free ginger ale. She uses protein shakes for breakfast.  She experiences ongoing leg pain, initially suspected to be sciatica but later diagnosed as piriformis syndrome by an orthopedic doctor. The pain originates under her buttocks and radiates down her leg, affecting her knee and limiting her ability to walk for the past month. She takes Aleve instead of the prescribed meloxicam due to concerns about side effects.  She feels frustrated and in a 'funk' due to her weight gain and leg pain. She acknowledges being her 'biggest challenge' and struggles  with maintaining motivation, especially in hot weather, which affects her ability to exercise.  She has a history of breast cancer, high blood pressure, and left ventricular hypertrophy (LVH). She is motivated to lose weight to prevent diabetes, reduce the risk of cancer recurrence, and improve her blood pressure.       Challenges affecting patient progress: Patient has lost motivation and has had all or none thought processes.    Pharmacotherapy for weight management: She is currently taking no anti-obesity medication.   Assessment and Plan   Treatment Plan For Obesity:  Recommended Dietary Goals  Briana Price is currently in the action stage of change. As such, her goal is to continue weight management plan. She has agreed to: continue current plan and continue to work on implementation of reduced calorie nutrition plan (RCNP)  Behavioral Health and Counseling  We discussed the following behavioral modification strategies today: work on meal planning and preparation, planning for success, continue to work on maintaining a reduced calorie state, getting the recommended amount of protein, incorporating whole foods, making healthy choices, staying well hydrated and practicing mindfulness when eating., and counseled on avoiding all or none mindset.  Additional education and resources provided today: None  Recommended Physical Activity Goals  Briana Price has been advised to work up to 150 minutes of moderate intensity aerobic activity a week and strengthening exercises 2-3 times per week for cardiovascular health, weight loss maintenance and preservation of muscle mass.   She has agreed to :  Think about enjoyable ways to increase daily physical activity  and overcoming barriers to exercise and Increase physical activity in their day and reduce sedentary time (increase NEAT).  Medical Interventions and Pharmacotherapy  We discussed various medication options to help Briana Price with her  weight loss efforts and we both agreed to : Continue with current nutritional and behavioral strategies and Has declined pharmacotherapy  Associated Conditions Impacted by Obesity Treatment  Essential (primary) hypertension  Mixed hyperlipidemia  Class 1 obesity with serious comorbidity and body mass index (BMI) of 34.0 to 34.9 in adult, unspecified obesity type  Prediabetes     Assessment and Plan    Obesity She has gained three pounds since the last visit, following a 1200 calorie meal plan 70% of the time, incorporating whole foods and recommended protein intake. She occasionally skips exercise due to leg pain and has been in a motivational 'funk'. Emphasized the importance of consistency over perfection and encouraged focusing on reasons for weight loss, such as preventing diabetes and reducing cancer recurrence risk. Discussed the potential use of appetite suppressants if needed, but she prefers to focus on lifestyle changes. - Encourage consistent adherence to a 1200 calorie meal plan. - Focus on consistency rather than perfection in dietary and exercise habits. - Drink 90 ounces of water daily. - Consume fibrous foods from whole grains, fruits, and vegetables. - Encourage protein intake of 90 grams per day from lean sources. - Use protein shakes for convenience, especially for breakfast, and add a piece of fruit. - Make healthier fast food choices if necessary. - Avoid sugary drinks and consider seltzer waters as an alternative. - Follow up in four weeks to assess progress and discuss potential appetite suppressants if needed.  Prediabetes Had an A1c of 6.2.  She is aware of risk of progression.  She is not interested in pharmacotherapy with metformin.  She will continue to work on nutritional and behavioral strategies for weight management.  Hypercholesterolemia At goal she is currently on statin therapy.  Continue current treatment    Piriformis Syndrome She reports  ongoing leg pain, initially thought to be sciatica, but diagnosed as piriformis syndrome by an orthopedic specialist. Pain starts under the buttock and shoots down the leg, affecting the knee, limiting her ability to walk and exercise, contributing to weight gain. Prefers Aleve over meloxicam due to concerns about side effects such as stroke and heart attack. - Listen to her body and gradually resume walking, starting with two laps or walking on level ground to assess leg response. - Continue taking Aleve for pain management.  Hypertension Hypertension linked to obesity. Addressing obesity could improve blood pressure and related conditions, such as left ventricular hypertrophy (LVH). - Monitor blood pressure and assess improvement with weight management efforts.  Breast Cancer She is motivated to reduce the risk of recurrence through weight management. - Encourage weight management as a strategy to reduce cancer recurrence risk.  General Health Maintenance Working on lifestyle modifications to improve overall health, including preventing diabetes and managing blood pressure. Emphasized the importance of small, manageable changes rather than overhauling everything at once. - Remember motivations for lifestyle changes, such as preventing diabetes and reducing cancer risk. - Focus on small, manageable changes rather than overhauling everything at once.        Objective   Physical Exam:  Blood pressure 115/78, pulse 71, temperature 98.3 F (36.8 C), height 5' 6 (1.676 m), weight 200 lb (90.7 kg), SpO2 97%. Body mass index is 32.28 kg/m.  General: She is overweight, cooperative, alert, well developed, and  in no acute distress. PSYCH: Has normal mood, affect and thought process.   HEENT: EOMI, sclerae are anicteric. Lungs: Normal breathing effort, no conversational dyspnea. Extremities: No edema.  Neurologic: No gross sensory or motor deficits. No tremors or fasciculations noted.     Diagnostic Data Reviewed:  BMET    Component Value Date/Time   NA 140 10/21/2023 1124   NA 138 09/11/2017 0850   K 3.8 10/21/2023 1124   K 3.7 09/11/2017 0850   CL 99 10/21/2023 1124   CO2 26 10/21/2023 1124   CO2 28 09/11/2017 0850   GLUCOSE 93 10/21/2023 1124   GLUCOSE 111 (H) 07/27/2023 0930   GLUCOSE 116 09/11/2017 0850   BUN 10 10/21/2023 1124   BUN 9.1 09/11/2017 0850   CREATININE 0.70 10/21/2023 1124   CREATININE 0.68 07/27/2023 0930   CREATININE 0.7 09/11/2017 0850   CALCIUM 10.1 10/21/2023 1124   CALCIUM 9.1 09/11/2017 0850   GFRNONAA >60 07/27/2023 0930   GFRAA >60 04/19/2020 1413   GFRAA >60 01/17/2019 1337   Lab Results  Component Value Date   HGBA1C 6.2 (H) 10/21/2023   Lab Results  Component Value Date   INSULIN  6.9 10/21/2023   Lab Results  Component Value Date   TSH 0.798 10/21/2023   CBC    Component Value Date/Time   WBC 7.6 10/21/2023 1124   WBC 6.3 07/27/2023 0930   WBC 9.1 02/28/2022 1244   RBC 5.10 10/21/2023 1124   RBC 4.43 07/27/2023 0930   HGB 14.8 10/21/2023 1124   HGB 11.2 (L) 09/11/2017 0850   HCT 44.3 10/21/2023 1124   HCT 33.7 (L) 09/11/2017 0850   PLT 375 10/21/2023 1124   MCV 87 10/21/2023 1124   MCV 87.2 09/11/2017 0850   MCH 29.0 10/21/2023 1124   MCH 29.6 07/27/2023 0930   MCHC 33.4 10/21/2023 1124   MCHC 34.6 07/27/2023 0930   RDW 12.3 10/21/2023 1124   RDW 16.9 (H) 09/11/2017 0850   Iron Studies No results found for: IRON, TIBC, FERRITIN, IRONPCTSAT Lipid Panel     Component Value Date/Time   CHOL 131 10/21/2023 1124   TRIG 82 10/21/2023 1124   HDL 47 10/21/2023 1124   LDLCALC 68 10/21/2023 1124   Hepatic Function Panel     Component Value Date/Time   PROT 7.9 10/21/2023 1124   PROT 7.0 09/11/2017 0850   ALBUMIN 4.9 10/21/2023 1124   ALBUMIN 3.7 09/11/2017 0850   AST 27 10/21/2023 1124   AST 23 07/27/2023 0930   AST 21 09/11/2017 0850   ALT 25 10/21/2023 1124   ALT 23 07/27/2023 0930    ALT 29 09/11/2017 0850   ALKPHOS 107 10/21/2023 1124   ALKPHOS 64 09/11/2017 0850   BILITOT 0.7 10/21/2023 1124   BILITOT 0.6 07/27/2023 0930   BILITOT 0.61 09/11/2017 0850      Component Value Date/Time   TSH 0.798 10/21/2023 1124   Nutritional Lab Results  Component Value Date   VD25OH 40.9 10/21/2023    Medications: Outpatient Encounter Medications as of 03/02/2024  Medication Sig   acetaminophen  (TYLENOL ) 500 MG tablet Take 1,000 mg by mouth every 8 (eight) hours as needed for moderate pain (pain score 4-6) or headache.   amLODipine  (NORVASC ) 5 MG tablet Take 1 tablet (5 mg total) by mouth daily.   Ascorbic Acid (VITAMIN C) 1000 MG tablet Take 1,000 mg by mouth daily.   atorvastatin (LIPITOR) 20 MG tablet Take 1 tablet by mouth daily.   CINNAMON  PO Take 2,000 mg by mouth daily at 12 noon.   Magnesium 400 MG CAPS Take by mouth daily at 12 noon.   Multiple Vitamins-Minerals (HAIR/SKIN/NAILS) CAPS Take 3 capsules by mouth daily.   Prenatal Vit-Fe Fumarate-FA (PRENATAL VITAMIN PO) Take by mouth daily.   spironolactone  (ALDACTONE ) 25 MG tablet Take 0.5 tablets (12.5 mg total) by mouth daily.   Facility-Administered Encounter Medications as of 03/02/2024  Medication   heparin  lock flush 100 unit/mL   sodium chloride  flush (NS) 0.9 % injection 10 mL   sodium chloride  flush (NS) 0.9 % injection 10 mL     Follow-Up   Return in about 4 weeks (around 03/30/2024) for For Weight Mangement with Dr. Francyne.SABRA She was informed of the importance of frequent follow up visits to maximize her success with intensive lifestyle modifications for her multiple health conditions.  Attestation Statement   Reviewed by clinician on day of visit: allergies, medications, problem list, medical history, surgical history, family history, social history, and previous encounter notes.     Lucas Francyne, MD

## 2024-04-05 ENCOUNTER — Encounter (INDEPENDENT_AMBULATORY_CARE_PROVIDER_SITE_OTHER): Payer: Self-pay | Admitting: Internal Medicine

## 2024-04-05 ENCOUNTER — Ambulatory Visit (INDEPENDENT_AMBULATORY_CARE_PROVIDER_SITE_OTHER): Admitting: Internal Medicine

## 2024-04-05 VITALS — BP 131/85 | HR 77 | Temp 98.4°F | Ht 66.0 in | Wt 192.0 lb

## 2024-04-05 DIAGNOSIS — I10 Essential (primary) hypertension: Secondary | ICD-10-CM | POA: Diagnosis not present

## 2024-04-05 DIAGNOSIS — R7303 Prediabetes: Secondary | ICD-10-CM

## 2024-04-05 DIAGNOSIS — Z6834 Body mass index (BMI) 34.0-34.9, adult: Secondary | ICD-10-CM

## 2024-04-05 DIAGNOSIS — E782 Mixed hyperlipidemia: Secondary | ICD-10-CM | POA: Diagnosis not present

## 2024-04-05 DIAGNOSIS — E66811 Obesity, class 1: Secondary | ICD-10-CM | POA: Diagnosis not present

## 2024-04-05 NOTE — Progress Notes (Signed)
 Office: [phone]  /  Fax: [phone]  Weight Summary and Body Composition Analysis (BIA)  Vitals Temp: 98.4 F (36.9 C) BP: 131/85 Pulse Rate: 77 SpO2: 98 %   Anthropometric Measurements Height: 5' 6 (1.676 m) Weight: 192 lb (87.1 kg) BMI (Calculated): 31 Weight at Last Visit: 200 lb Weight Lost Since Last Visit: 8 lb Starting Weight: 207 lb Total Weight Loss (lbs): 15 lb (6.804 kg) Peak Weight: 250 lb   Body Composition  Body Fat %: 42.8 % Fat Mass (lbs): 82.4 lbs Muscle Mass (lbs): 104.6 lbs Total Body Water (lbs): 74.2 lbs Visceral Fat Rating : 11    RMR: 1642  Today's Visit #: 6  Starting Date: 10/21/23   Subjective   Chief Complaint: Obesity  Interval History Discussed the use of AI scribe software for clinical note transcription with the patient, who gave verbal consent to proceed.  History of Present Illness   Briana Price is a 58 year old female with prediabetes who presents for medical weight management.  Since her last visit, she has lost eight pounds by adhering to a 1200 calorie nutrition plan, focusing on whole foods, and exercising three days a week for thirty minutes. Despite knee pain limiting her ability to walk, she performs exercises at home.  She does not experience significant hunger and can go all day without eating. Her snacks include pumpkin seeds, almonds, and occasionally a bag of sea salt chips or fruits like cherries and plums. She has started freezing her protein shakes to consume them like ice cream. Although she is not consistently reaching her protein intake goal of 90 grams per day, she is consuming more than before.  Her current diet includes two pieces of sugar-free wheat toast in the morning, sometimes with sugar-free strawberry preserves, followed by two eggs a couple of hours later. She drinks a protein shake around noon and includes two eggs in her salad, sometimes adding shrimp for extra protein. She does not  eat meat but consumes seafood like tuna and shrimp.  She has a history of prediabetes with a last A1c of 6.2 in February.       Challenges affecting patient progress: limited food variation or intolerances.    Pharmacotherapy for weight management: She is currently taking no anti-obesity medication and patient has declined pharmacotherapy in past.   Assessment and Plan   Treatment Plan For Obesity:  Recommended Dietary Goals  Briana Price is currently in the action stage of change. As such, her goal is to continue weight management plan. She has agreed to: continue current plan  Behavioral Health and Counseling  We discussed the following behavioral modification strategies today: increasing lean protein intake to established goals, increasing vegetables, and increasing fiber rich foods.  Additional education and resources provided today: Provided with personal guidance and instructions on how to use [email or web address] for healthy meal ideas and cooking in bulk.  Recommended Physical Activity Goals  Briana Price has been advised to work up to 150 minutes of moderate intensity aerobic activity a week and strengthening exercises 2-3 times per week for cardiovascular health, weight loss maintenance and preservation of muscle mass.   She has agreed to :  continue to gradually increase the amount and intensity of exercise routine  Medical Interventions and Pharmacotherapy  We discussed various medication options to help Briana Price with her weight loss efforts and we both agreed to : Continue with current nutritional and behavioral strategies  Associated Conditions Impacted by Obesity Treatment  Assessment & Plan  Essential (primary) hypertension Blood pressure is well-controlled on amlodipine  and spironolactone . Recent renal parameters are within normal limits  Losing 10% of body weight may improve blood pressure control.  Continue current regimen Prediabetes Her last A1c was 6.2 in  February, indicating prediabetes.  She has been working on reducing carbs eating more whole foods and has lost 15 pounds.  We will be checking hemoglobin A1c at the next office visit.  She had declined pharmacoprophylaxis with metformin in the past. Class 1 obesity with serious comorbidity and body mass index (BMI) of 34.0 to 34.9 in adult, unspecified obesity type She has lost a total of 15 pounds on current weight management strategy.She has lost eight pounds since the last visit, adhering to a 1200 calorie nutrition plan. She consumes more whole foods and protein, exercises three days a week for thirty minutes, and snacks on healthy options like pumpkin seeds and almonds. She incorporates more salads and seafood into her diet and explores new recipes to maintain weight loss. - Continue 1200 calorie nutrition plan. - Encourage exercise three days a week for thirty minutes. - Encourage consumption of whole foods and increased protein intake. - Explore new recipes and meal prep ideas to maintain dietary variety and adherence. Mixed hyperlipidemia She is considering discontinuing her cholesterol medication to assess if her cholesterol levels can be managed through diet and weight loss alone. This will help determine if her hyperlipidemia is genetic or related to weight and nutrition. - Hold cholesterol medication for four weeks. - Recheck cholesterol levels after four weeks with fasting. - If LDL cholesterol remains elevated then likely genetic and she may benefit from ongoing lipid-lowering therapy          Objective   Physical Exam:  Blood pressure 131/85, pulse 77, temperature 98.4 F (36.9 C), height 5' 6 (1.676 m), weight 192 lb (87.1 kg), SpO2 98%. Body mass index is 30.99 kg/m.  General: She is overweight, cooperative, alert, well developed, and in no acute distress. PSYCH: Has normal mood, affect and thought process.   HEENT: EOMI, sclerae are anicteric. Lungs: Normal breathing  effort, no conversational dyspnea. Extremities: No edema.  Neurologic: No gross sensory or motor deficits. No tremors or fasciculations noted.    Diagnostic Data Reviewed:  BMET    Component Value Date/Time   NA 140 10/21/2023 1124   NA 138 09/11/2017 0850   K 3.8 10/21/2023 1124   K 3.7 09/11/2017 0850   CL 99 10/21/2023 1124   CO2 26 10/21/2023 1124   CO2 28 09/11/2017 0850   GLUCOSE 93 10/21/2023 1124   GLUCOSE 111 (H) 07/27/2023 0930   GLUCOSE 116 09/11/2017 0850   BUN 10 10/21/2023 1124   BUN 9.1 09/11/2017 0850   CREATININE 0.70 10/21/2023 1124   CREATININE 0.68 07/27/2023 0930   CREATININE 0.7 09/11/2017 0850   CALCIUM 10.1 10/21/2023 1124   CALCIUM 9.1 09/11/2017 0850   GFRNONAA >60 07/27/2023 0930   GFRAA >60 04/19/2020 1413   GFRAA >60 01/17/2019 1337   Lab Results  Component Value Date   HGBA1C 6.2 (H) 10/21/2023   Lab Results  Component Value Date   INSULIN  6.9 10/21/2023   Lab Results  Component Value Date   TSH 0.798 10/21/2023   CBC    Component Value Date/Time   WBC 7.6 10/21/2023 1124   WBC 6.3 07/27/2023 0930   WBC 9.1 02/28/2022 1244   RBC 5.10 10/21/2023 1124   RBC 4.43 07/27/2023 0930   HGB 14.8 10/21/2023  1124   HGB 11.2 (L) 09/11/2017 0850   HCT 44.3 10/21/2023 1124   HCT 33.7 (L) 09/11/2017 0850   PLT 375 10/21/2023 1124   MCV 87 10/21/2023 1124   MCV 87.2 09/11/2017 0850   MCH 29.0 10/21/2023 1124   MCH 29.6 07/27/2023 0930   MCHC 33.4 10/21/2023 1124   MCHC 34.6 07/27/2023 0930   RDW 12.3 10/21/2023 1124   RDW 16.9 (H) 09/11/2017 0850   Iron Studies No results found for: IRON, TIBC, FERRITIN, IRONPCTSAT Lipid Panel     Component Value Date/Time   CHOL 131 10/21/2023 1124   TRIG 82 10/21/2023 1124   HDL 47 10/21/2023 1124   LDLCALC 68 10/21/2023 1124   Hepatic Function Panel     Component Value Date/Time   PROT 7.9 10/21/2023 1124   PROT 7.0 09/11/2017 0850   ALBUMIN 4.9 10/21/2023 1124   ALBUMIN 3.7  09/11/2017 0850   AST 27 10/21/2023 1124   AST 23 07/27/2023 0930   AST 21 09/11/2017 0850   ALT 25 10/21/2023 1124   ALT 23 07/27/2023 0930   ALT 29 09/11/2017 0850   ALKPHOS 107 10/21/2023 1124   ALKPHOS 64 09/11/2017 0850   BILITOT 0.7 10/21/2023 1124   BILITOT 0.6 07/27/2023 0930   BILITOT 0.61 09/11/2017 0850      Component Value Date/Time   TSH 0.798 10/21/2023 1124   Nutritional Lab Results  Component Value Date   VD25OH 40.9 10/21/2023    Medications: Outpatient Encounter Medications as of 04/05/2024  Medication Sig   acetaminophen  (TYLENOL ) 500 MG tablet Take 1,000 mg by mouth every 8 (eight) hours as needed for moderate pain (pain score 4-6) or headache.   amLODipine  (NORVASC ) 5 MG tablet Take 1 tablet (5 mg total) by mouth daily.   Ascorbic Acid (VITAMIN C) 1000 MG tablet Take 1,000 mg by mouth daily.   atorvastatin (LIPITOR) 20 MG tablet Take 1 tablet by mouth daily.   CINNAMON PO Take 2,000 mg by mouth daily at 12 noon.   Magnesium 400 MG CAPS Take by mouth daily at 12 noon.   Multiple Vitamins-Minerals (HAIR/SKIN/NAILS) CAPS Take 3 capsules by mouth daily.   Prenatal Vit-Fe Fumarate-FA (PRENATAL VITAMIN PO) Take by mouth daily.   spironolactone  (ALDACTONE ) 25 MG tablet Take 0.5 tablets (12.5 mg total) by mouth daily.   Facility-Administered Encounter Medications as of 04/05/2024  Medication   heparin  lock flush 100 unit/mL   sodium chloride  flush (NS) 0.9 % injection 10 mL   sodium chloride  flush (NS) 0.9 % injection 10 mL     Follow-Up   Return in about 4 weeks (around 05/03/2024) for For Weight Mangement with Dr. Francyne.SABRA She was informed of the importance of frequent follow up visits to maximize her success with intensive lifestyle modifications for her multiple health conditions.  Attestation Statement   Reviewed by clinician on day of visit: allergies, medications, problem list, medical history, surgical history, family history, social history, and  previous encounter notes.     Lucas Francyne, MD

## 2024-04-05 NOTE — Assessment & Plan Note (Signed)
 Blood pressure is well-controlled on amlodipine and spironolactone. Recent renal parameters are within normal limits  Losing 10% of body weight may improve blood pressure control.  Continue current regimen

## 2024-04-05 NOTE — Assessment & Plan Note (Signed)
 She has lost a total of 15 pounds on current weight management strategy.She has lost eight pounds since the last visit, adhering to a 1200 calorie nutrition plan. She consumes more whole foods and protein, exercises three days a week for thirty minutes, and snacks on healthy options like pumpkin seeds and almonds. She incorporates more salads and seafood into her diet and explores new recipes to maintain weight loss. - Continue 1200 calorie nutrition plan. - Encourage exercise three days a week for thirty minutes. - Encourage consumption of whole foods and increased protein intake. - Explore new recipes and meal prep ideas to maintain dietary variety and adherence.

## 2024-04-05 NOTE — Assessment & Plan Note (Signed)
 Her last A1c was 6.2 in February, indicating prediabetes.  She has been working on reducing carbs eating more whole foods and has lost 15 pounds.  We will be checking hemoglobin A1c at the next office visit.  She had declined pharmacoprophylaxis with metformin in the past.

## 2024-04-05 NOTE — Assessment & Plan Note (Signed)
 She is considering discontinuing her cholesterol medication to assess if her cholesterol levels can be managed through diet and weight loss alone. This will help determine if her hyperlipidemia is genetic or related to weight and nutrition. - Hold cholesterol medication for four weeks. - Recheck cholesterol levels after four weeks with fasting. - If LDL cholesterol remains elevated then likely genetic and she may benefit from ongoing lipid-lowering therapy

## 2024-05-03 ENCOUNTER — Ambulatory Visit (INDEPENDENT_AMBULATORY_CARE_PROVIDER_SITE_OTHER): Admitting: Internal Medicine

## 2024-05-19 ENCOUNTER — Encounter (INDEPENDENT_AMBULATORY_CARE_PROVIDER_SITE_OTHER): Payer: Self-pay | Admitting: Internal Medicine

## 2024-05-19 ENCOUNTER — Ambulatory Visit (INDEPENDENT_AMBULATORY_CARE_PROVIDER_SITE_OTHER): Admitting: Internal Medicine

## 2024-05-19 VITALS — BP 136/84 | HR 70 | Temp 98.3°F | Ht 66.0 in | Wt 192.0 lb

## 2024-05-19 DIAGNOSIS — R7303 Prediabetes: Secondary | ICD-10-CM

## 2024-05-19 DIAGNOSIS — I10 Essential (primary) hypertension: Secondary | ICD-10-CM | POA: Diagnosis not present

## 2024-05-19 DIAGNOSIS — E66811 Obesity, class 1: Secondary | ICD-10-CM

## 2024-05-19 DIAGNOSIS — E782 Mixed hyperlipidemia: Secondary | ICD-10-CM

## 2024-05-19 DIAGNOSIS — Z6834 Body mass index (BMI) 34.0-34.9, adult: Secondary | ICD-10-CM

## 2024-05-19 NOTE — Progress Notes (Signed)
 "  Office: [phone]  /  Fax: [phone]  Weight Summary and Body Composition Analysis (BIA)  Vitals Temp: 98.3 F (36.8 C) BP: 136/84 Pulse Rate: 70 SpO2: 98 %   Anthropometric Measurements Height: 5' 6 (1.676 m) Weight: 192 lb (87.1 kg) BMI (Calculated): 31 Weight at Last Visit: 192 lb Weight Lost Since Last Visit: 0 lb Weight Gained Since Last Visit: 0 lb Starting Weight: 207 lb Total Weight Loss (lbs): 15 lb (6.804 kg) Peak Weight: 250 lb   No data recorded  RMR: 1642  Today's Visit #: 8  Starting Date: 10/21/23   Subjective   Chief Complaint: Obesity  Interval History Discussed the use of AI scribe software for clinical note transcription with the patient, who gave verbal consent to proceed.  History of Present Illness Briana Price is a 58 year old female who presents for medical weight management.  Since last office visit she has maintained she is following up 1200-calorie nutrition plan with good adherence she has been eating more whole foods getting the recommended amount of protein acknowledges not maintaining adequate hydration she has not been exercising.  She experiences ongoing knee pain, which significantly limits her ability to engage in physical activities, particularly walking.  Her dietary habits include occasional indulgences, such as eating cake during a church event, but she generally maintains a diet with increased vegetable intake and salads. She consumes a protein shake daily, typically the Premier brand with strawberries and cream flavor, and sometimes has two boiled eggs in the morning. She occasionally skips lunch or opts for a salad, and dinner often includes another salad with shrimp. Breakfast usually consists of avocado toast made with [employer] No Sugar whole wheat bread and avocado mash from [employer]. Despite these efforts, she is concerned about not losing weight and believes she might not be eating enough, as she does not  feel hungry despite consuming minimal calories.  She has a history of using a pedometer to track steps but currently does not have one in use. She has experimented with sugar-free sodas but is aware of potential issues with artificial sweeteners. She is considering alternatives like fruit-infused water or seltzer waters. She has not been engaging in regular exercise due to knee pain but is contemplating setting up a home exercise space to facilitate physical activity.     Challenges affecting patient progress: low volume of physical activity at present , menopause, and inadequate response to nutritional and behavioral strategies.    Pharmacotherapy for weight management: She is currently taking no anti-obesity medication and patient has declined pharmacotherapy in past.   Assessment and Plan   Treatment Plan For Obesity:  Recommended Dietary Goals  Briana Price is currently in the action stage of change. As such, her goal is to continue weight management plan. She has agreed to: incorporate prepackaged healthy meals for convenience, incorporate 1-2 meal replacements a day for convenience , and continue current plan  Behavioral Health and Counseling  We discussed the following behavioral modification strategies today: continue to work on maintaining a reduced calorie state, getting the recommended amount of protein, incorporating whole foods, making healthy choices, staying well hydrated and practicing mindfulness when eating. and increase protein intake, fibrous foods (25 grams per day for women, 30 grams for men) and water to improve satiety and decrease hunger signals. .  Additional education and resources provided today: None  Recommended Physical Activity Goals  Briana Price has been advised to work up to 150 minutes of moderate intensity aerobic  activity a week and strengthening exercises 2-3 times per week for cardiovascular health, weight loss maintenance and preservation of muscle  mass.  She has agreed to :  Think about enjoyable ways to increase daily physical activity and overcoming barriers to exercise, Increase physical activity in their day and reduce sedentary time (increase NEAT)., Increase volume of physical activity to a goal of 240 minutes a week, and Combine aerobic and strengthening exercises for efficiency and improved cardiometabolic health.  Medical Interventions and Pharmacotherapy  We discussed various medication options to help Briana Price with her weight loss efforts and we both agreed to : Continue with current nutritional and behavioral strategies and Has declined pharmacotherapy  Associated Conditions Impacted by Obesity Treatment  Assessment & Plan Essential (primary) hypertension Vitals:   05/19/24 1500  BP: 136/84    Blood pressure is not at goal for age and risk category.  On amlodipine  and spironolactone  without adverse effects.  Most recent renal parameters reviewed which showed stable electrolytes and kidney function.  Continue with weight loss therapy. Losing 10% may improve blood pressure control. Monitor for symptoms of orthostasis while losing weight. Continue current regimen and home monitoring for a goal blood pressure of < 120/80.   Prediabetes Her last A1c was 6.2 in February, indicating prediabetes.  She has been working on reducing carbs eating more whole foods and has lost 24 pounds we will be checking hemoglobin A1c at the next office visit.  She had declined pharmacoprophylaxis with metformin in the past. Mixed hyperlipidemia On atorvastatin 20 mg once a day.  Recommend checking fasting lipid profile in the next month or 2 Class 1 obesity with serious comorbidity and body mass index (BMI) of 34.0 to 34.9 in adult, unspecified obesity type Overall: Weight: decrease of 39.3 lb (17%) over 4 years, 7 months  Start: 10/20/2019 231 lb 4.8 oz (104.9 kg)  End: 05/19/2024 192 lb (87.1 kg)  Since starting Programs Weight: decrease of  24.5 lb (11.3%) over 9 months, 3 weeks  Start: 07/27/2023 216 lb 8 oz (98.2 kg)  End: 05/19/2024 192 lb (87.1 kg)   Overall she has been successful losing weight and keeping the weight off but is hitting slow down due to variations in nutrition as well as physical activity.  She appears to be sensitive to exercise. Obesity with a focus on weight management. Current dietary intake includes low-calorie meals with an emphasis on whole grains and protein. Physical activity is limited due to knee pain, impacting weight loss efforts. Metabolic rate is approximately 1600 calories, with a need to reduce intake to 1100-1200 calories for weight loss. Discussed the importance of exercise in promoting fat burning, improving insulin  sensitivity, and increasing metabolic rate. Emphasized the role of muscle activation in reducing internal fat and inflammation, and promoting bone health. Discussed potential benefits of meal replacements and intermittent fasting as strategies to manage caloric intake. Highlighted the negative impact of artificial sweeteners on metabolism and appetite. - Encourage meal replacement for one meal daily and two meals twice a week. - Advise on increasing physical activity, aiming for 5,000-10,000 steps daily. - Recommend strengthening exercises, such as yoga or light weights, aiming for 240 minutes of combined cardio and strengthening per week. - Suggest setting up a home exercise area with dumbbells and a bench. - Discuss intermittent fasting with an 8-hour eating window. - Advise against artificial sweeteners and suggest alternatives like fruit-infused water or seltzer waters.  Recording duration: 25 minutes        Objective  Physical Exam:  Blood pressure 136/84, pulse 70, temperature 98.3 F (36.8 C), height 5' 6 (1.676 m), weight 192 lb (87.1 kg), SpO2 98%. Body mass index is 30.99 kg/m.  General: She is overweight, cooperative, alert, well developed, and in no acute  distress. PSYCH: Has normal mood, affect and thought process.   HEENT: EOMI, sclerae are anicteric. Lungs: Normal breathing effort, no conversational dyspnea. Extremities: No edema.  Neurologic: No gross sensory or motor deficits. No tremors or fasciculations noted.    Diagnostic Data Reviewed:  BMET    Component Value Date/Time   NA 140 10/21/2023 1124   NA 138 09/11/2017 0850   K 3.8 10/21/2023 1124   K 3.7 09/11/2017 0850   CL 99 10/21/2023 1124   CO2 26 10/21/2023 1124   CO2 28 09/11/2017 0850   GLUCOSE 93 10/21/2023 1124   GLUCOSE 111 (H) 07/27/2023 0930   GLUCOSE 116 09/11/2017 0850   BUN 10 10/21/2023 1124   BUN 9.1 09/11/2017 0850   CREATININE 0.70 10/21/2023 1124   CREATININE 0.68 07/27/2023 0930   CREATININE 0.7 09/11/2017 0850   CALCIUM 10.1 10/21/2023 1124   CALCIUM 9.1 09/11/2017 0850   GFRNONAA >60 07/27/2023 0930   GFRAA >60 04/19/2020 1413   GFRAA >60 01/17/2019 1337   Lab Results  Component Value Date   HGBA1C 6.2 (H) 10/21/2023   Lab Results  Component Value Date   INSULIN  6.9 10/21/2023   Lab Results  Component Value Date   TSH 0.798 10/21/2023   CBC    Component Value Date/Time   WBC 7.6 10/21/2023 1124   WBC 6.3 07/27/2023 0930   WBC 9.1 02/28/2022 1244   RBC 5.10 10/21/2023 1124   RBC 4.43 07/27/2023 0930   HGB 14.8 10/21/2023 1124   HGB 11.2 (L) 09/11/2017 0850   HCT 44.3 10/21/2023 1124   HCT 33.7 (L) 09/11/2017 0850   PLT 375 10/21/2023 1124   MCV 87 10/21/2023 1124   MCV 87.2 09/11/2017 0850   MCH 29.0 10/21/2023 1124   MCH 29.6 07/27/2023 0930   MCHC 33.4 10/21/2023 1124   MCHC 34.6 07/27/2023 0930   RDW 12.3 10/21/2023 1124   RDW 16.9 (H) 09/11/2017 0850   Iron Studies No results found for: IRON, TIBC, FERRITIN, IRONPCTSAT Lipid Panel     Component Value Date/Time   CHOL 131 10/21/2023 1124   TRIG 82 10/21/2023 1124   HDL 47 10/21/2023 1124   LDLCALC 68 10/21/2023 1124   Hepatic Function Panel      Component Value Date/Time   PROT 7.9 10/21/2023 1124   PROT 7.0 09/11/2017 0850   ALBUMIN 4.9 10/21/2023 1124   ALBUMIN 3.7 09/11/2017 0850   AST 27 10/21/2023 1124   AST 23 07/27/2023 0930   AST 21 09/11/2017 0850   ALT 25 10/21/2023 1124   ALT 23 07/27/2023 0930   ALT 29 09/11/2017 0850   ALKPHOS 107 10/21/2023 1124   ALKPHOS 64 09/11/2017 0850   BILITOT 0.7 10/21/2023 1124   BILITOT 0.6 07/27/2023 0930   BILITOT 0.61 09/11/2017 0850      Component Value Date/Time   TSH 0.798 10/21/2023 1124   Nutritional Lab Results  Component Value Date   VD25OH 40.9 10/21/2023    Medications: Outpatient Encounter Medications as of 05/19/2024  Medication Sig   acetaminophen  (TYLENOL ) 500 MG tablet Take 1,000 mg by mouth every 8 (eight) hours as needed for moderate pain (pain score 4-6) or headache.   amLODipine  (NORVASC ) 5 MG  tablet Take 1 tablet (5 mg total) by mouth daily.   Ascorbic Acid (VITAMIN C) 1000 MG tablet Take 1,000 mg by mouth daily.   atorvastatin (LIPITOR) 20 MG tablet Take 1 tablet by mouth daily.   CINNAMON PO Take 2,000 mg by mouth daily at 12 noon.   Multiple Vitamins-Minerals (HAIR/SKIN/NAILS) CAPS Take 3 capsules by mouth daily.   Prenatal Vit-Fe Fumarate-FA (PRENATAL VITAMIN PO) Take by mouth daily.   spironolactone  (ALDACTONE ) 25 MG tablet Take 0.5 tablets (12.5 mg total) by mouth daily.   Magnesium 400 MG CAPS Take by mouth daily at 12 noon.   Facility-Administered Encounter Medications as of 05/19/2024  Medication   heparin  lock flush 100 unit/mL   sodium chloride  flush (NS) 0.9 % injection 10 mL   sodium chloride  flush (NS) 0.9 % injection 10 mL     Follow-Up   Return in about 6 weeks (around 06/30/2024) for For Weight Mangement with Dr. Francyne.SABRA She was informed of the importance of frequent follow up visits to maximize her success with intensive lifestyle modifications for her multiple health conditions.  Attestation Statement   Reviewed by  clinician on day of visit: allergies, medications, problem list, medical history, surgical history, family history, social history, and previous encounter notes.   I have spent 31 minutes in the care of the patient today including: 1 minutes before the visit reviewing and preparing the chart. 25 minutes face-to-face assessing and reviewing listed medical problems as outlined in obesity care plan, providing nutritional and behavioral counseling on topics outlined in the obesity care plan, independently interpreting test results and goals of care, as described in assessment and plan, and reviewing and discussing biometric information and progress 5 minutes after the visit updating chart and documentation of encounter.    Lucas Francyne, MD  "

## 2024-05-20 NOTE — Assessment & Plan Note (Signed)
 On atorvastatin 20 mg once a day.  Recommend checking fasting lipid profile in the next month or 2

## 2024-05-20 NOTE — Assessment & Plan Note (Signed)
 Overall: Weight: decrease of 39.3 lb (17%) over 4 years, 7 months  Start: 10/20/2019 231 lb 4.8 oz (104.9 kg)  End: 05/19/2024 192 lb (87.1 kg)  Since starting Programs Weight: decrease of 24.5 lb (11.3%) over 9 months, 3 weeks  Start: 07/27/2023 216 lb 8 oz (98.2 kg)  End: 05/19/2024 192 lb (87.1 kg)   Overall she has been successful losing weight and keeping the weight off but is hitting slow down due to variations in nutrition as well as physical activity.  She appears to be sensitive to exercise. Obesity with a focus on weight management. Current dietary intake includes low-calorie meals with an emphasis on whole grains and protein. Physical activity is limited due to knee pain, impacting weight loss efforts. Metabolic rate is approximately 1600 calories, with a need to reduce intake to 1100-1200 calories for weight loss. Discussed the importance of exercise in promoting fat burning, improving insulin  sensitivity, and increasing metabolic rate. Emphasized the role of muscle activation in reducing internal fat and inflammation, and promoting bone health. Discussed potential benefits of meal replacements and intermittent fasting as strategies to manage caloric intake. Highlighted the negative impact of artificial sweeteners on metabolism and appetite. - Encourage meal replacement for one meal daily and two meals twice a week. - Advise on increasing physical activity, aiming for 5,000-10,000 steps daily. - Recommend strengthening exercises, such as yoga or light weights, aiming for 240 minutes of combined cardio and strengthening per week. - Suggest setting up a home exercise area with dumbbells and a bench. - Discuss intermittent fasting with an 8-hour eating window. - Advise against artificial sweeteners and suggest alternatives like fruit-infused water or seltzer waters.

## 2024-05-20 NOTE — Assessment & Plan Note (Signed)
 Vitals:   05/19/24 1500  BP: 136/84    Blood pressure is not at goal for age and risk category.  On amlodipine  and spironolactone  without adverse effects.  Most recent renal parameters reviewed which showed stable electrolytes and kidney function.  Continue with weight loss therapy. Losing 10% may improve blood pressure control. Monitor for symptoms of orthostasis while losing weight. Continue current regimen and home monitoring for a goal blood pressure of < 120/80.

## 2024-05-20 NOTE — Assessment & Plan Note (Signed)
 Her last A1c was 6.2 in February, indicating prediabetes.  She has been working on reducing carbs eating more whole foods and has lost 24 pounds we will be checking hemoglobin A1c at the next office visit.  She had declined pharmacoprophylaxis with metformin in the past.

## 2024-05-23 ENCOUNTER — Encounter (INDEPENDENT_AMBULATORY_CARE_PROVIDER_SITE_OTHER): Payer: Self-pay

## 2024-05-23 ENCOUNTER — Encounter (INDEPENDENT_AMBULATORY_CARE_PROVIDER_SITE_OTHER): Payer: Self-pay | Admitting: Internal Medicine

## 2024-05-27 DIAGNOSIS — F320 Major depressive disorder, single episode, mild: Secondary | ICD-10-CM | POA: Diagnosis not present

## 2024-06-27 DIAGNOSIS — E119 Type 2 diabetes mellitus without complications: Secondary | ICD-10-CM | POA: Diagnosis not present

## 2024-06-27 DIAGNOSIS — I10 Essential (primary) hypertension: Secondary | ICD-10-CM | POA: Diagnosis not present

## 2024-07-04 ENCOUNTER — Ambulatory Visit (INDEPENDENT_AMBULATORY_CARE_PROVIDER_SITE_OTHER): Admitting: Internal Medicine

## 2024-07-04 ENCOUNTER — Encounter (INDEPENDENT_AMBULATORY_CARE_PROVIDER_SITE_OTHER): Payer: Self-pay | Admitting: Internal Medicine

## 2024-07-04 VITALS — BP 113/73 | HR 82 | Temp 98.2°F | Ht 66.0 in | Wt 196.0 lb

## 2024-07-04 DIAGNOSIS — E782 Mixed hyperlipidemia: Secondary | ICD-10-CM

## 2024-07-04 DIAGNOSIS — I10 Essential (primary) hypertension: Secondary | ICD-10-CM | POA: Diagnosis not present

## 2024-07-04 DIAGNOSIS — E66811 Obesity, class 1: Secondary | ICD-10-CM | POA: Diagnosis not present

## 2024-07-04 DIAGNOSIS — R7303 Prediabetes: Secondary | ICD-10-CM

## 2024-07-04 DIAGNOSIS — Z6834 Body mass index (BMI) 34.0-34.9, adult: Secondary | ICD-10-CM

## 2024-07-04 NOTE — Progress Notes (Signed)
 "  Office: [phone]  /  Fax: [phone]  Weight Summary and Body Composition Analysis (BIA)  Vitals Temp: 98.2 F (36.8 C) BP: 113/73 Pulse Rate: 82 SpO2: 99 %   Anthropometric Measurements Height: 5' 6 (1.676 m) Weight: 196 lb (88.9 kg) BMI (Calculated): 31.65 Weight at Last Visit: 192lb Weight Lost Since Last Visit: 0lb Weight Gained Since Last Visit: 4lb Starting Weight: 207lb Total Weight Loss (lbs): 11 lb (4.99 kg) Peak Weight: 250lb   Body Composition  Body Fat %: 43.6 % Fat Mass (lbs): 85.4 lbs Muscle Mass (lbs): 105 lbs Total Body Water (lbs): 74 lbs Visceral Fat Rating : 11    RMR: 1642  Today's Visit #: 9  Starting Date: 10/21/23   Subjective   Chief Complaint: Obesity  Interval History Discussed the use of AI scribe software for clinical note transcription with the patient, who gave verbal consent to proceed.  History of Present Illness Briana Price is a 58 year old female with obesity, prediabetes, and high cholesterol who presents for medical weight management.  She is following a 1200-calorie nutrition plan 80% of the time.  She has not been tracking reports eating more whole foods getting the recommended amount of protein occasionally skips meals she has not been exercising.  She has experienced a recent weight gain of four pounds despite maintaining her usual dietary habits. She attributes this to consuming [employer] trail mix, which is high in sugar and fat, and acknowledges eating more than one serving at a time. She reports that she has been eating more than one serving of [employer] trail mix at a time, which she recognizes may have contributed to her recent weight gain. She has lost approximately 30 pounds over the past year and seven months, reducing her weight from 222 pounds to 192 pounds, but has recently increased to 196 pounds.  Her physical activity levels have not changed, and she describes being in a 'funk' and not adhering to  previous recommendations. She is motivated to lose weight to avoid diabetes and reduce the risk of cancer recurrence. She has not taken her cholesterol medication since August and is interested in determining if her high cholesterol is hereditary. Her last A1c was 6.1, slightly down from 6.2.  She wants to avoid additional medications and is considering increasing her physical activity. She is considering dietary changes, including reducing carbohydrate intake and increasing vegetable consumption. She mentions using a sugar-free bread but is concerned about artificial sweeteners.  She is aware of the need to monitor her caloric intake and physical activity to maintain weight loss. She has previously used tracking apps and is considering using them again to help manage her diet and exercise.     Challenges affecting patient progress: recent lapse in weight management plan due to work, travel, illness or family circumstances, all-or- none mindset, and some of the motivation.    Pharmacotherapy for weight management: She is currently taking no anti-obesity medication and patient has declined pharmacotherapy in past.   Assessment and Plan   Treatment Plan For Obesity:  Recommended Dietary Goals  Briana Price is currently in the action stage of change. As such, her goal is to continue weight management plan. She has agreed to: incorporate prepackaged healthy meals for convenience, incorporate 1-2 meal replacements a day for convenience , continue current plan, and continue to work on implementation of reduced calorie nutrition plan (RCNP).  She was provided with a 7-day tailored meal plan targeting at 1000 cal/day moderate in protein,  whole foods.  Behavioral Health and Counseling  We discussed the following behavioral modification strategies today: work on meal planning and preparation, work on tracking and journaling calories using tracking application, continue to work on maintaining a reduced  calorie state, getting the recommended amount of protein, incorporating whole foods, making healthy choices, staying well hydrated and practicing mindfulness when eating., increase protein intake, fibrous foods (25 grams per day for women, 30 grams for men) and water to improve satiety and decrease hunger signals. , avoid all or none thinking, display self-compassion, and rethink your why .  Additional education and resources provided today: None  Recommended Physical Activity Goals  Briana Price has been advised to work up to 150 minutes of moderate intensity aerobic activity a week and strengthening exercises 2-3 times per week for cardiovascular health, weight loss maintenance and preservation of muscle mass.  She has agreed to :  Think about enjoyable ways to increase daily physical activity and overcoming barriers to exercise, Increase physical activity in their day and reduce sedentary time (increase NEAT)., Increase volume of physical activity to a goal of 240 minutes a week, and Combine aerobic and strengthening exercises for efficiency and improved cardiometabolic health.  Medical Interventions and Pharmacotherapy  We discussed various medication options to help Briana Price with her weight loss efforts and we both agreed to : Continue with current nutritional and behavioral strategies and Has declined pharmacotherapy  Associated Conditions Impacted by Obesity Treatment  Assessment & Plan Essential (primary) hypertension Vitals:   07/04/24 1400  BP: 113/73    Blood pressure is at goal for age and risk category.  On amlodipine  and spironolactone  without adverse effects.  Most recent renal parameters reviewed which showed stable electrolytes and kidney function.  Continue with weight loss therapy. Losing 10% may improve blood pressure control. Monitor for symptoms of orthostasis while losing weight. Continue current regimen and home monitoring for a goal blood pressure of < 120/80.    Prediabetes A1c is currently 6.1%, slightly decreased from 6.2%. Prefers to manage condition through lifestyle changes. Discussed potential benefits of metformin for managing prediabetes, aiding weight loss, and improving cholesterol levels, but prefers to avoid medication. Emphasized importance of exercise and dietary management in controlling prediabetes. - Encourage 150 minutes of exercise per week to help manage prediabetes. - Advise maintaining a low-carbohydrate diet to aid in weight management and control blood sugar levels. - Discuss the potential use of metformin if lifestyle changes are insufficient. Class 1 obesity with serious comorbidity and body mass index (BMI) of 34.0 to 34.9 in adult, unspecified obesity type Weight gain of four pounds attributed to dietary changes, particularly high-calorie trail mix consumption. Previously lost 30 pounds over a year and seven months, reaching 192 pounds from 222 pounds. Current weight is 196 pounds. Weight gain likely due to decreased physical activity and dietary indulgences. Targeting a weight loss of 20 pounds over the next five months, aiming for a sustainable rate of one pound per week. - Provide a 7-day 1000-calorie meal plan with medium protein and easy-to-prepare options. - Encourage self-monitoring of physical activity and dietary intake using tracking apps like MyFitnessPal or Lose It. - Advise increasing physical activity to 240 minutes per week, combining strength and cardio exercises. - Recommend focusing on a diet rich in vegetables and protein, limiting carbohydrates, and avoiding liquid calories. - Suggest using meal replacements like protein shakes or bars to replace meals as needed. Mixed hyperlipidemia Discontinued statin medication since August. Interested in determining if high cholesterol is  hereditary. Cholesterol levels were excellent in February while on medication. Plans to assess cholesterol levels without medication to  evaluate baseline status. - Order fasting lipid panel to assess cholesterol levels without statin medication. - Consider testing for lipoprotein A to evaluate hereditary cholesterol risk. - Advise scheduling the next visit in the morning to facilitate fasting blood work.          Objective   Physical Exam:  Blood pressure 113/73, pulse 82, temperature 98.2 F (36.8 C), height 5' 6 (1.676 m), weight 196 lb (88.9 kg), SpO2 99%. Body mass index is 31.64 kg/m.  General: She is overweight, cooperative, alert, well developed, and in no acute distress. PSYCH: Has normal mood, affect and thought process.   HEENT: EOMI, sclerae are anicteric. Lungs: Normal breathing effort, no conversational dyspnea. Extremities: No edema.  Neurologic: No gross sensory or motor deficits. No tremors or fasciculations noted.    Diagnostic Data Reviewed:  BMET    Component Value Date/Time   NA 140 10/21/2023 1124   NA 138 09/11/2017 0850   K 3.8 10/21/2023 1124   K 3.7 09/11/2017 0850   CL 99 10/21/2023 1124   CO2 26 10/21/2023 1124   CO2 28 09/11/2017 0850   GLUCOSE 93 10/21/2023 1124   GLUCOSE 111 (H) 07/27/2023 0930   GLUCOSE 116 09/11/2017 0850   BUN 10 10/21/2023 1124   BUN 9.1 09/11/2017 0850   CREATININE 0.70 10/21/2023 1124   CREATININE 0.68 07/27/2023 0930   CREATININE 0.7 09/11/2017 0850   CALCIUM 10.1 10/21/2023 1124   CALCIUM 9.1 09/11/2017 0850   GFRNONAA >60 07/27/2023 0930   GFRAA >60 04/19/2020 1413   GFRAA >60 01/17/2019 1337   Lab Results  Component Value Date   HGBA1C 6.2 (H) 10/21/2023   Lab Results  Component Value Date   INSULIN  6.9 10/21/2023   Lab Results  Component Value Date   TSH 0.798 10/21/2023   CBC    Component Value Date/Time   WBC 7.6 10/21/2023 1124   WBC 6.3 07/27/2023 0930   WBC 9.1 02/28/2022 1244   RBC 5.10 10/21/2023 1124   RBC 4.43 07/27/2023 0930   HGB 14.8 10/21/2023 1124   HGB 11.2 (L) 09/11/2017 0850   HCT 44.3 10/21/2023 1124    HCT 33.7 (L) 09/11/2017 0850   PLT 375 10/21/2023 1124   MCV 87 10/21/2023 1124   MCV 87.2 09/11/2017 0850   MCH 29.0 10/21/2023 1124   MCH 29.6 07/27/2023 0930   MCHC 33.4 10/21/2023 1124   MCHC 34.6 07/27/2023 0930   RDW 12.3 10/21/2023 1124   RDW 16.9 (H) 09/11/2017 0850   Iron Studies No results found for: IRON, TIBC, FERRITIN, IRONPCTSAT Lipid Panel     Component Value Date/Time   CHOL 131 10/21/2023 1124   TRIG 82 10/21/2023 1124   HDL 47 10/21/2023 1124   LDLCALC 68 10/21/2023 1124   Hepatic Function Panel     Component Value Date/Time   PROT 7.9 10/21/2023 1124   PROT 7.0 09/11/2017 0850   ALBUMIN 4.9 10/21/2023 1124   ALBUMIN 3.7 09/11/2017 0850   AST 27 10/21/2023 1124   AST 23 07/27/2023 0930   AST 21 09/11/2017 0850   ALT 25 10/21/2023 1124   ALT 23 07/27/2023 0930   ALT 29 09/11/2017 0850   ALKPHOS 107 10/21/2023 1124   ALKPHOS 64 09/11/2017 0850   BILITOT 0.7 10/21/2023 1124   BILITOT 0.6 07/27/2023 0930   BILITOT 0.61 09/11/2017 0850  Component Value Date/Time   TSH 0.798 10/21/2023 1124   Nutritional Lab Results  Component Value Date   VD25OH 40.9 10/21/2023    Medications: Outpatient Encounter Medications as of 07/04/2024  Medication Sig   acetaminophen  (TYLENOL ) 500 MG tablet Take 1,000 mg by mouth every 8 (eight) hours as needed for moderate pain (pain score 4-6) or headache.   amLODipine  (NORVASC ) 5 MG tablet Take 1 tablet (5 mg total) by mouth daily.   Ascorbic Acid (VITAMIN C) 1000 MG tablet Take 1,000 mg by mouth daily.   atorvastatin (LIPITOR) 20 MG tablet Take 1 tablet by mouth daily.   CINNAMON PO Take 2,000 mg by mouth daily at 12 noon.   Multiple Vitamins-Minerals (HAIR/SKIN/NAILS) CAPS Take 3 capsules by mouth daily.   Prenatal Vit-Fe Fumarate-FA (PRENATAL VITAMIN PO) Take by mouth daily.   spironolactone  (ALDACTONE ) 25 MG tablet Take 0.5 tablets (12.5 mg total) by mouth daily.   Facility-Administered Encounter  Medications as of 07/04/2024  Medication   heparin  lock flush 100 unit/mL   sodium chloride  flush (NS) 0.9 % injection 10 mL   sodium chloride  flush (NS) 0.9 % injection 10 mL     Follow-Up   Return in about 4 weeks (around 08/01/2024) for Fasting for labs.. She was informed of the importance of frequent follow up visits to maximize her success with intensive lifestyle modifications for her multiple health conditions.  Attestation Statement   Reviewed by clinician on day of visit: allergies, medications, problem list, medical history, surgical history, family history, social history, and previous encounter notes.     Lucas Parker, MD  "

## 2024-07-05 NOTE — Assessment & Plan Note (Signed)
 Weight gain of four pounds attributed to dietary changes, particularly high-calorie trail mix consumption. Previously lost 30 pounds over a year and seven months, reaching 192 pounds from 222 pounds. Current weight is 196 pounds. Weight gain likely due to decreased physical activity and dietary indulgences. Targeting a weight loss of 20 pounds over the next five months, aiming for a sustainable rate of one pound per week. - Provide a 7-day 1000-calorie meal plan with medium protein and easy-to-prepare options. - Encourage self-monitoring of physical activity and dietary intake using tracking apps like MyFitnessPal or Lose It. - Advise increasing physical activity to 240 minutes per week, combining strength and cardio exercises. - Recommend focusing on a diet rich in vegetables and protein, limiting carbohydrates, and avoiding liquid calories. - Suggest using meal replacements like protein shakes or bars to replace meals as needed.

## 2024-07-05 NOTE — Assessment & Plan Note (Signed)
 Vitals:   07/04/24 1400  BP: 113/73    Blood pressure is at goal for age and risk category.  On amlodipine  and spironolactone  without adverse effects.  Most recent renal parameters reviewed which showed stable electrolytes and kidney function.  Continue with weight loss therapy. Losing 10% may improve blood pressure control. Monitor for symptoms of orthostasis while losing weight. Continue current regimen and home monitoring for a goal blood pressure of < 120/80.

## 2024-07-05 NOTE — Assessment & Plan Note (Signed)
 Discontinued statin medication since August. Interested in determining if high cholesterol is hereditary. Cholesterol levels were excellent in February while on medication. Plans to assess cholesterol levels without medication to evaluate baseline status. - Order fasting lipid panel to assess cholesterol levels without statin medication. - Consider testing for lipoprotein A to evaluate hereditary cholesterol risk. - Advise scheduling the next visit in the morning to facilitate fasting blood work.

## 2024-07-05 NOTE — Assessment & Plan Note (Signed)
 A1c is currently 6.1%, slightly decreased from 6.2%. Prefers to manage condition through lifestyle changes. Discussed potential benefits of metformin for managing prediabetes, aiding weight loss, and improving cholesterol levels, but prefers to avoid medication. Emphasized importance of exercise and dietary management in controlling prediabetes. - Encourage 150 minutes of exercise per week to help manage prediabetes. - Advise maintaining a low-carbohydrate diet to aid in weight management and control blood sugar levels. - Discuss the potential use of metformin if lifestyle changes are insufficient.

## 2024-07-08 DIAGNOSIS — E782 Mixed hyperlipidemia: Secondary | ICD-10-CM | POA: Diagnosis not present

## 2024-07-08 DIAGNOSIS — I10 Essential (primary) hypertension: Secondary | ICD-10-CM | POA: Diagnosis not present

## 2024-07-08 DIAGNOSIS — Z0000 Encounter for general adult medical examination without abnormal findings: Secondary | ICD-10-CM | POA: Diagnosis not present

## 2024-08-10 ENCOUNTER — Ambulatory Visit (INDEPENDENT_AMBULATORY_CARE_PROVIDER_SITE_OTHER): Payer: Self-pay | Admitting: Internal Medicine

## 2024-08-12 ENCOUNTER — Other Ambulatory Visit: Payer: Self-pay

## 2024-08-12 ENCOUNTER — Telehealth: Payer: Self-pay

## 2024-08-12 NOTE — Telephone Encounter (Signed)
 Patient called c/o of Mid back pain not painful to the touch but painful with certain movements. Patient denies any lower back pain or dysuria. Patient states she is afebrile, no other concerns. Offered office visit to see Dr. Lanny, patient stated she would contact her PCP first and f/u with us  PRN.  Let patient know I would forward her message to Dr. Lanny and her team to make them aware.

## 2024-09-06 ENCOUNTER — Ambulatory Visit (INDEPENDENT_AMBULATORY_CARE_PROVIDER_SITE_OTHER): Admitting: Internal Medicine

## 2024-09-06 ENCOUNTER — Encounter (INDEPENDENT_AMBULATORY_CARE_PROVIDER_SITE_OTHER): Payer: Self-pay | Admitting: Internal Medicine

## 2024-09-06 VITALS — BP 99/66 | HR 77 | Temp 98.1°F | Ht 66.0 in | Wt 186.0 lb

## 2024-09-06 DIAGNOSIS — E66811 Obesity, class 1: Secondary | ICD-10-CM

## 2024-09-06 DIAGNOSIS — E782 Mixed hyperlipidemia: Secondary | ICD-10-CM | POA: Diagnosis not present

## 2024-09-06 DIAGNOSIS — Z6830 Body mass index (BMI) 30.0-30.9, adult: Secondary | ICD-10-CM | POA: Diagnosis not present

## 2024-09-06 DIAGNOSIS — I10 Essential (primary) hypertension: Secondary | ICD-10-CM

## 2024-09-06 DIAGNOSIS — R7303 Prediabetes: Secondary | ICD-10-CM

## 2024-09-06 MED ORDER — AMLODIPINE BESYLATE 2.5 MG PO TABS: 2.5000 mg | ORAL_TABLET | Freq: Every day | ORAL | 0 refills | Status: AC

## 2024-09-06 NOTE — Progress Notes (Signed)
 "  Office: [phone]  /  Fax: [phone]  Weight Summary and Body Composition Analysis (BIA)  Vitals Temp: 98.1 F (36.7 C) BP: 99/66 Pulse Rate: 77 SpO2: 97 %   Anthropometric Measurements Height: 5' 6 (1.676 m) Weight: 186 lb (84.4 kg) BMI (Calculated): 30.04 Weight at Last Visit: 196 lb Weight Lost Since Last Visit: 10 lb Weight Gained Since Last Visit: 0 Starting Weight: 207 lb Total Weight Loss (lbs): 21 lb (9.526 kg) Peak Weight: 250 lb   Body Composition  Body Fat %: 40.6 % Fat Mass (lbs): 75.8 lbs Muscle Mass (lbs): 105.4 lbs Total Body Water (lbs): 68.8 lbs Visceral Fat Rating : 10    RMR: 1642  Today's Visit #: 9  Starting Date: 10/21/23   Subjective   Chief Complaint: Obesity  Interval History  Discussed the use of AI scribe software for clinical note transcription with the patient, who gave verbal consent to proceed.  History of Present Illness Briana Price is a 58 year old female with hypertension, prediabetes, and hyperlipidemia who presents for medical weight management.  She has lost ten pounds since her last office visit. She follows a one thousand calorie nutrition plan seventy-five percent of the time and has not been exercising. She has been eating more salads and drinking more water, while reducing her intake of sugar-free Pepsi. She has been eating egg salad with light mayonnaise or Miracle Whip light and sweet salad cubes to increase her protein intake. She also consumes tuna and is considering other protein sources like chicken salad.  She has been holding her cholesterol medication to assess the impact of lifestyle changes without medication. She inquired about a blood test related to her cholesterol management.  She experiences dizziness or lightheadedness when running up the steps quickly, but not when walking up the steps.  She has a history of increased leg edema while sitting all day at work, which was managed with  compression socks. No recent issues with fluid retention or leg swelling.     Challenges affecting patient progress: low volume of physical activity at present .    Pharmacotherapy for weight management: She is currently taking no anti-obesity medication and patient has declined pharmacotherapy in past.   Assessment and Plan   Treatment Plan For Obesity:  Recommended Dietary Goals  [name] is currently in the action stage of change. As such, her goal is to continue weight management plan. She has agreed to: continue current plan  Behavioral Health and Counseling  We discussed the following behavioral modification strategies today: continue to work on maintaining a reduced calorie state, getting the recommended amount of protein, incorporating whole foods, making healthy choices, staying well hydrated and practicing mindfulness when eating. and increase protein intake, fibrous foods (25 grams per day for women, 30 grams for men) and water to improve satiety and decrease hunger signals. .  Additional education and resources provided today: None  Recommended Physical Activity Goals  [name] has been advised to work up to 150 minutes of moderate intensity aerobic activity a week and strengthening exercises 2-3 times per week for cardiovascular health, weight loss maintenance and preservation of muscle mass.  She has agreed to :  Think about enjoyable ways to increase daily physical activity and overcoming barriers to exercise and Increase physical activity in their day and reduce sedentary time (increase NEAT).  Medical Interventions and Pharmacotherapy  We discussed various medication options to help Floyd with her weight loss efforts and we both agreed to :  Continue with current nutritional and behavioral strategies and Has declined pharmacotherapy  Associated Conditions Impacted by Obesity Treatment  Assessment & Plan Essential (primary)  hypertension  Prediabetes  Mixed hyperlipidemia  Class 1 obesity with serious comorbidity and body mass index (BMI) of 30.0 to 30.9 in adult, unspecified obesity type Weight: decrease of 36.9 lb (16.6%) over 2 years  Start: 08/21/2022 222 lb 14.4 oz (101.1 kg)  End: 09/06/2024 186 lb (84.4 kg)      Assessment and Plan Assessment & Plan Obesity She has lost 10 pounds since the last visit by following a 1000 calorie nutrition plan 75% of the time. She has not been exercising but has increased physical activity slightly by walking. She is encouraged to continue her dietary changes and increase physical activity to aid in further weight loss. - Continue 1000 calorie nutrition plan - Increase physical activity, including walking and chair exercises  Essential hypertension Blood pressure is currently 99/66, indicating a significant drop. She experiences occasional dizziness when running up steps quickly. Amlodipine  dose reduction is considered due to low blood pressure readings. Spironolactone  is continued for hair benefits. - Reduced amlodipine  to 2.5 mg - Monitor blood pressure three times a week, before medication and before bedtime - Continue spironolactone  for hair benefits  Prediabetes A1c was 6.1 in October, indicating prediabetes. She prefers to continue with lifestyle changes rather than starting metformin. Discussed potential benefits of metformin, including weight loss and cholesterol management, if A1c remains elevated. - Ordered A1c and insulin  levels - Will consider metformin if A1c remains elevated  Mixed hyperlipidemia She has not been taking her cholesterol medication and is interested in assessing the impact of lifestyle changes. Discussed the importance of fasting before cholesterol testing and the potential genetic component of her hyperlipidemia. - Ordered fasting cholesterol panel - Ordered lipoprotein A test to assess genetic component of hyperlipidemia - Advised  fasting before cholesterol testing        Objective   Physical Exam:  Blood pressure 99/66, pulse 77, temperature 98.1 F (36.7 C), height 5' 6 (1.676 m), weight 186 lb (84.4 kg), SpO2 97%. Body mass index is 30.02 kg/m.  General: She is overweight, cooperative, alert, well developed, and in no acute distress. PSYCH: Has normal mood, affect and thought process.   HEENT: EOMI, sclerae are anicteric. Lungs: Normal breathing effort, no conversational dyspnea. Extremities: No edema.  Neurologic: No gross sensory or motor deficits. No tremors or fasciculations noted.    Diagnostic Data Reviewed:  BMET    Component Value Date/Time   NA 140 10/21/2023 1124   NA 138 09/11/2017 0850   K 3.8 10/21/2023 1124   K 3.7 09/11/2017 0850   CL 99 10/21/2023 1124   CO2 26 10/21/2023 1124   CO2 28 09/11/2017 0850   GLUCOSE 93 10/21/2023 1124   GLUCOSE 111 (H) 07/27/2023 0930   GLUCOSE 116 09/11/2017 0850   BUN 10 10/21/2023 1124   BUN 9.1 09/11/2017 0850   CREATININE 0.70 10/21/2023 1124   CREATININE 0.68 07/27/2023 0930   CREATININE 0.7 09/11/2017 0850   CALCIUM 10.1 10/21/2023 1124   CALCIUM 9.1 09/11/2017 0850   GFRNONAA >60 07/27/2023 0930   GFRAA >60 04/19/2020 1413   GFRAA >60 01/17/2019 1337   Lab Results  Component Value Date   HGBA1C 6.2 (H) 10/21/2023   Lab Results  Component Value Date   INSULIN  6.9 10/21/2023   Lab Results  Component Value Date   TSH 0.798 10/21/2023   CBC  Component Value Date/Time   WBC 7.6 10/21/2023 1124   WBC 6.3 07/27/2023 0930   WBC 9.1 02/28/2022 1244   RBC 5.10 10/21/2023 1124   RBC 4.43 07/27/2023 0930   HGB 14.8 10/21/2023 1124   HGB 11.2 (L) 09/11/2017 0850   HCT 44.3 10/21/2023 1124   HCT 33.7 (L) 09/11/2017 0850   PLT 375 10/21/2023 1124   MCV 87 10/21/2023 1124   MCV 87.2 09/11/2017 0850   MCH 29.0 10/21/2023 1124   MCH 29.6 07/27/2023 0930   MCHC 33.4 10/21/2023 1124   MCHC 34.6 07/27/2023 0930   RDW 12.3  10/21/2023 1124   RDW 16.9 (H) 09/11/2017 0850   Iron Studies No results found for: IRON, TIBC, FERRITIN, IRONPCTSAT Lipid Panel     Component Value Date/Time   CHOL 131 10/21/2023 1124   TRIG 82 10/21/2023 1124   HDL 47 10/21/2023 1124   LDLCALC 68 10/21/2023 1124   Hepatic Function Panel     Component Value Date/Time   PROT 7.9 10/21/2023 1124   PROT 7.0 09/11/2017 0850   ALBUMIN 4.9 10/21/2023 1124   ALBUMIN 3.7 09/11/2017 0850   AST 27 10/21/2023 1124   AST 23 07/27/2023 0930   AST 21 09/11/2017 0850   ALT 25 10/21/2023 1124   ALT 23 07/27/2023 0930   ALT 29 09/11/2017 0850   ALKPHOS 107 10/21/2023 1124   ALKPHOS 64 09/11/2017 0850   BILITOT 0.7 10/21/2023 1124   BILITOT 0.6 07/27/2023 0930   BILITOT 0.61 09/11/2017 0850      Component Value Date/Time   TSH 0.798 10/21/2023 1124   Nutritional Lab Results  Component Value Date   VD25OH 40.9 10/21/2023    Medications: Outpatient Encounter Medications as of 09/06/2024  Medication Sig   acetaminophen  (TYLENOL ) 500 MG tablet Take 1,000 mg by mouth every 8 (eight) hours as needed for moderate pain (pain score 4-6) or headache.   amLODipine  (NORVASC ) 5 MG tablet Take 1 tablet (5 mg total) by mouth daily.   Ascorbic Acid (VITAMIN C) 1000 MG tablet Take 1,000 mg by mouth daily.   atorvastatin (LIPITOR) 20 MG tablet Take 1 tablet by mouth daily.   CINNAMON PO Take 2,000 mg by mouth daily at 12 noon.   Multiple Vitamins-Minerals (HAIR/SKIN/NAILS) CAPS Take 3 capsules by mouth daily.   Prenatal Vit-Fe Fumarate-FA (PRENATAL VITAMIN PO) Take by mouth daily.   spironolactone  (ALDACTONE ) 25 MG tablet Take 0.5 tablets (12.5 mg total) by mouth daily.   Facility-Administered Encounter Medications as of 09/06/2024  Medication   heparin  lock flush 100 unit/mL   sodium chloride  flush (NS) 0.9 % injection 10 mL   sodium chloride  flush (NS) 0.9 % injection 10 mL     Follow-Up   No follow-ups on file.SABRA She was  informed of the importance of frequent follow up visits to maximize her success with intensive lifestyle modifications for her multiple health conditions.  Attestation Statement   Reviewed by clinician on day of visit: allergies, medications, problem list, medical history, surgical history, family history, social history, and previous encounter notes.     Lucas Parker, MD   "

## 2024-09-07 DIAGNOSIS — E782 Mixed hyperlipidemia: Secondary | ICD-10-CM | POA: Diagnosis not present

## 2024-09-07 DIAGNOSIS — R7303 Prediabetes: Secondary | ICD-10-CM | POA: Diagnosis not present

## 2024-09-08 LAB — LIPID PANEL WITH LDL/HDL RATIO
Cholesterol, Total: 168 mg/dL (ref 100–199)
HDL: 38 mg/dL — ABNORMAL LOW
LDL Chol Calc (NIH): 117 mg/dL — ABNORMAL HIGH (ref 0–99)
LDL/HDL Ratio: 3.1 ratio (ref 0.0–3.2)
Triglycerides: 66 mg/dL (ref 0–149)
VLDL Cholesterol Cal: 13 mg/dL (ref 5–40)

## 2024-09-08 LAB — INSULIN, RANDOM: INSULIN: 11.8 u[IU]/mL (ref 2.6–24.9)

## 2024-09-08 LAB — LIPOPROTEIN A (LPA): Lipoprotein (a): 190.5 nmol/L — ABNORMAL HIGH

## 2024-09-12 ENCOUNTER — Ambulatory Visit (INDEPENDENT_AMBULATORY_CARE_PROVIDER_SITE_OTHER): Payer: Self-pay | Admitting: Internal Medicine

## 2024-09-26 ENCOUNTER — Encounter: Payer: Self-pay | Admitting: Cardiovascular Disease

## 2024-09-26 ENCOUNTER — Telehealth: Payer: Self-pay | Admitting: Cardiovascular Disease

## 2024-09-26 NOTE — Telephone Encounter (Signed)
 Pt c/o medication issue:  1. Name of Medication: amLODipine  (NORVASC ) 2.5 MG tablet   2. How are you currently taking this medication (dosage and times per day)?   3. Are you having a reaction (difficulty breathing--STAT)?   4. What is your medication issue? Patient called stating her BP has been running low lately and Dr. Francyne reduced this medication from 5mg  to 2.5mg .  She wanted to make sure Dr. Raford was okay with this before she picks up the new script. Please advise.

## 2024-09-26 NOTE — Telephone Encounter (Signed)
 BP was 99/66 at [facility], she reported has also had an episode of dizziness to them.  She felt like it was that she was moving to quick, standing up too quick.  Has lost some weight.   Has BP cuff at home but doesn't check her BP.  Adv to follow the recommendations and decrease the dose of amlodipine  to 2.5 mg daily and about 2-4 hours after the dose, check one blood pressure daily.  Keep track and let us  know if pressure consistently go above 140/90.  Adv to stay hydrated.    Pt voices understanding and grateful for information provided.

## 2024-09-26 NOTE — Telephone Encounter (Signed)
 Agree with plan, thanks for reviewing with her!  Bayle Calvo S Neha Waight, NP

## 2024-09-26 NOTE — Telephone Encounter (Signed)
 error

## 2024-10-18 ENCOUNTER — Ambulatory Visit (INDEPENDENT_AMBULATORY_CARE_PROVIDER_SITE_OTHER): Admitting: Internal Medicine

## 2024-11-10 ENCOUNTER — Ambulatory Visit (INDEPENDENT_AMBULATORY_CARE_PROVIDER_SITE_OTHER): Admitting: Internal Medicine

## 2025-02-03 ENCOUNTER — Encounter (HOSPITAL_BASED_OUTPATIENT_CLINIC_OR_DEPARTMENT_OTHER): Admitting: Cardiovascular Disease
